# Patient Record
Sex: Female | Born: 1980 | Race: Black or African American | Hispanic: No | Marital: Single | Smoking: Never smoker
Health system: Southern US, Community
[De-identification: ages and names within clinical notes are randomized; demographics above are authoritative.]

## PROBLEM LIST (undated history)

## (undated) DIAGNOSIS — F419 Anxiety disorder, unspecified: Secondary | ICD-10-CM

## (undated) DIAGNOSIS — D219 Benign neoplasm of connective and other soft tissue, unspecified: Secondary | ICD-10-CM

## (undated) DIAGNOSIS — G8929 Other chronic pain: Secondary | ICD-10-CM

## (undated) DIAGNOSIS — R519 Headache, unspecified: Secondary | ICD-10-CM

## (undated) DIAGNOSIS — D649 Anemia, unspecified: Secondary | ICD-10-CM

## (undated) DIAGNOSIS — N920 Excessive and frequent menstruation with regular cycle: Secondary | ICD-10-CM

## (undated) DIAGNOSIS — R51 Headache: Secondary | ICD-10-CM

## (undated) DIAGNOSIS — K5900 Constipation, unspecified: Secondary | ICD-10-CM

## (undated) DIAGNOSIS — D500 Iron deficiency anemia secondary to blood loss (chronic): Secondary | ICD-10-CM

## (undated) DIAGNOSIS — R002 Palpitations: Secondary | ICD-10-CM

## (undated) DIAGNOSIS — B019 Varicella without complication: Secondary | ICD-10-CM

## (undated) HISTORY — DX: Iron deficiency anemia secondary to blood loss (chronic): D50.0

## (undated) HISTORY — DX: Other chronic pain: G89.29

## (undated) HISTORY — DX: Varicella without complication: B01.9

## (undated) HISTORY — DX: Headache, unspecified: R51.9

## (undated) HISTORY — DX: Palpitations: R00.2

## (undated) HISTORY — DX: Benign neoplasm of connective and other soft tissue, unspecified: D21.9

## (undated) HISTORY — DX: Anemia, unspecified: D64.9

## (undated) HISTORY — DX: Constipation, unspecified: K59.00

## (undated) HISTORY — DX: Excessive and frequent menstruation with regular cycle: N92.0

## (undated) HISTORY — DX: Anxiety disorder, unspecified: F41.9

## (undated) HISTORY — DX: Headache: R51

---
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
## 1998-02-07 ENCOUNTER — Emergency Department (HOSPITAL_COMMUNITY): Admission: EM | Admit: 1998-02-07 | Discharge: 1998-02-07 | Payer: Self-pay | Admitting: Emergency Medicine

## 1998-07-30 ENCOUNTER — Emergency Department (HOSPITAL_COMMUNITY): Admission: EM | Admit: 1998-07-30 | Discharge: 1998-07-30 | Payer: Self-pay | Admitting: Emergency Medicine

## 1998-08-01 ENCOUNTER — Emergency Department (HOSPITAL_COMMUNITY): Admission: EM | Admit: 1998-08-01 | Discharge: 1998-08-01 | Payer: Self-pay | Admitting: Emergency Medicine

## 1998-10-14 ENCOUNTER — Inpatient Hospital Stay (HOSPITAL_COMMUNITY): Admission: AD | Admit: 1998-10-14 | Discharge: 1998-10-14 | Payer: Self-pay | Admitting: Obstetrics & Gynecology

## 1998-10-27 ENCOUNTER — Ambulatory Visit (HOSPITAL_COMMUNITY): Admission: RE | Admit: 1998-10-27 | Discharge: 1998-10-27 | Payer: Self-pay | Admitting: *Deleted

## 1998-11-07 ENCOUNTER — Inpatient Hospital Stay (HOSPITAL_COMMUNITY): Admission: AD | Admit: 1998-11-07 | Discharge: 1998-11-07 | Payer: Self-pay | Admitting: Obstetrics

## 1998-12-02 ENCOUNTER — Inpatient Hospital Stay (HOSPITAL_COMMUNITY): Admission: AD | Admit: 1998-12-02 | Discharge: 1998-12-02 | Payer: Self-pay | Admitting: *Deleted

## 1998-12-03 ENCOUNTER — Inpatient Hospital Stay (HOSPITAL_COMMUNITY): Admission: RE | Admit: 1998-12-03 | Discharge: 1998-12-03 | Payer: Self-pay | Admitting: *Deleted

## 1998-12-07 ENCOUNTER — Inpatient Hospital Stay (HOSPITAL_COMMUNITY): Admission: AD | Admit: 1998-12-07 | Discharge: 1998-12-07 | Payer: Self-pay | Admitting: *Deleted

## 1998-12-13 ENCOUNTER — Inpatient Hospital Stay (HOSPITAL_COMMUNITY): Admission: AD | Admit: 1998-12-13 | Discharge: 1998-12-13 | Payer: Self-pay | Admitting: Obstetrics & Gynecology

## 1999-02-07 ENCOUNTER — Inpatient Hospital Stay (HOSPITAL_COMMUNITY): Admission: AD | Admit: 1999-02-07 | Discharge: 1999-02-07 | Payer: Self-pay | Admitting: Obstetrics

## 1999-04-20 ENCOUNTER — Inpatient Hospital Stay (HOSPITAL_COMMUNITY): Admission: AD | Admit: 1999-04-20 | Discharge: 1999-04-22 | Payer: Self-pay | Admitting: *Deleted

## 1999-04-25 ENCOUNTER — Inpatient Hospital Stay (HOSPITAL_COMMUNITY): Admission: AD | Admit: 1999-04-25 | Discharge: 1999-04-25 | Payer: Self-pay | Admitting: *Deleted

## 1999-07-04 ENCOUNTER — Emergency Department (HOSPITAL_COMMUNITY): Admission: EM | Admit: 1999-07-04 | Discharge: 1999-07-04 | Payer: Self-pay | Admitting: Emergency Medicine

## 2001-02-20 ENCOUNTER — Emergency Department (HOSPITAL_COMMUNITY): Admission: EM | Admit: 2001-02-20 | Discharge: 2001-02-20 | Payer: Self-pay | Admitting: Emergency Medicine

## 2001-08-02 ENCOUNTER — Emergency Department (HOSPITAL_COMMUNITY): Admission: EM | Admit: 2001-08-02 | Discharge: 2001-08-02 | Payer: Self-pay | Admitting: Emergency Medicine

## 2005-08-10 ENCOUNTER — Ambulatory Visit: Payer: Self-pay | Admitting: Family Medicine

## 2005-08-10 ENCOUNTER — Other Ambulatory Visit: Admission: RE | Admit: 2005-08-10 | Discharge: 2005-08-10 | Payer: Self-pay | Admitting: Family Medicine

## 2011-07-12 ENCOUNTER — Ambulatory Visit: Payer: Self-pay | Admitting: Family Medicine

## 2011-09-05 ENCOUNTER — Ambulatory Visit: Payer: Self-pay | Admitting: Family Medicine

## 2011-12-13 ENCOUNTER — Encounter: Payer: Self-pay | Admitting: Family Medicine

## 2012-01-05 ENCOUNTER — Ambulatory Visit: Payer: Self-pay | Admitting: Family Medicine

## 2012-01-29 ENCOUNTER — Ambulatory Visit: Payer: Self-pay | Admitting: Family Medicine

## 2012-02-23 ENCOUNTER — Ambulatory Visit: Payer: Self-pay | Admitting: Family Medicine

## 2012-03-13 ENCOUNTER — Ambulatory Visit: Payer: Self-pay | Admitting: Family Medicine

## 2012-03-25 ENCOUNTER — Ambulatory Visit: Payer: Self-pay | Admitting: Family Medicine

## 2012-06-24 ENCOUNTER — Ambulatory Visit (INDEPENDENT_AMBULATORY_CARE_PROVIDER_SITE_OTHER): Payer: BC Managed Care – PPO | Admitting: Family Medicine

## 2012-06-24 ENCOUNTER — Encounter: Payer: Self-pay | Admitting: Family Medicine

## 2012-06-24 ENCOUNTER — Other Ambulatory Visit (HOSPITAL_COMMUNITY)
Admission: RE | Admit: 2012-06-24 | Discharge: 2012-06-24 | Disposition: A | Discharge status: Home / Self Care | Payer: BC Managed Care – PPO | Source: Ambulatory Visit | Attending: Family Medicine | Admitting: Family Medicine

## 2012-06-24 VITALS — BP 120/70 | HR 85 | Temp 98.5°F | Ht 66.25 in | Wt 181.0 lb

## 2012-06-24 DIAGNOSIS — J329 Chronic sinusitis, unspecified: Secondary | ICD-10-CM

## 2012-06-24 DIAGNOSIS — Z01419 Encounter for gynecological examination (general) (routine) without abnormal findings: Secondary | ICD-10-CM

## 2012-06-24 DIAGNOSIS — M25569 Pain in unspecified knee: Secondary | ICD-10-CM

## 2012-06-24 DIAGNOSIS — Z202 Contact with and (suspected) exposure to infections with a predominantly sexual mode of transmission: Secondary | ICD-10-CM

## 2012-06-24 DIAGNOSIS — Z9189 Other specified personal risk factors, not elsewhere classified: Secondary | ICD-10-CM

## 2012-06-24 DIAGNOSIS — M25561 Pain in right knee: Secondary | ICD-10-CM

## 2012-06-24 DIAGNOSIS — M25562 Pain in left knee: Secondary | ICD-10-CM | POA: Insufficient documentation

## 2012-06-24 DIAGNOSIS — Z124 Encounter for screening for malignant neoplasm of cervix: Secondary | ICD-10-CM

## 2012-06-24 LAB — CBC WITH DIFFERENTIAL/PLATELET
Basophils Absolute: 0.1 10*3/uL (ref 0.0–0.1)
Eosinophils Absolute: 0.4 10*3/uL (ref 0.0–0.7)
Hemoglobin: 8.8 g/dL — ABNORMAL LOW (ref 12.0–15.0)
Lymphocytes Relative: 15.7 % (ref 12.0–46.0)
MCHC: 30.1 g/dL (ref 30.0–36.0)
Neutro Abs: 6.3 10*3/uL (ref 1.4–7.7)
Neutrophils Relative %: 74.7 % (ref 43.0–77.0)
RDW: 18.6 % — ABNORMAL HIGH (ref 11.5–14.6)

## 2012-06-24 LAB — HEPATIC FUNCTION PANEL
ALT: 7 U/L (ref 0–35)
Bilirubin, Direct: 0 mg/dL (ref 0.0–0.3)
Total Bilirubin: 0.5 mg/dL (ref 0.3–1.2)

## 2012-06-24 LAB — LIPID PANEL
Cholesterol: 103 mg/dL (ref 0–200)
HDL: 46.4 mg/dL (ref >39.00)
LDL Cholesterol: 47 mg/dL (ref 0–99)
Triglycerides: 50 mg/dL (ref 0.0–149.0)
VLDL: 10 mg/dL (ref 0.0–40.0)

## 2012-06-24 LAB — BASIC METABOLIC PANEL
Chloride: 106 mEq/L (ref 96–112)
Creatinine, Ser: 0.8 mg/dL (ref 0.4–1.2)

## 2012-06-24 MED ORDER — AMOXICILLIN 875 MG PO TABS: 875.0000 mg | ORAL_TABLET | Freq: Two times a day (BID) | ORAL | Status: DC

## 2012-06-24 MED ORDER — FERROUS SULFATE 325 (65 FE) MG PO TABS: 325.0000 mg | ORAL_TABLET | Freq: Every day | ORAL | Status: DC

## 2012-06-24 NOTE — Patient Instructions (Addendum)
We'll notify you of your lab results and make any changes if needed Start the Amox twice daily for the sinus infection- take w/ food Drink plenty of fluids Mucinex to thin your congestion and drainage Continue the allergy medication Call with any questions or concerns Welcome!  We're glad to have you!

## 2012-06-24 NOTE — Progress Notes (Signed)
  Subjective:    Patient ID: Tamara Ball, female    DOB: 10/17/1980, 32 y.o.   MRN: [id number]  HPI New to establish.  Previous MD- [facility].  No current GYN.  Desires CPE.  Sinus congestion- now having HAs, facial pain/pressure.  + nasal congestion.  sxs started 1 week ago.  Hx of seasonal allergies but no relief w/ allergy meds.  No fevers.  Mild body aches, no ear pain.  No cough.  + PND.  + sick contacts.  Bilateral knee pain- pain started ~6 months ago after working out.  Now will have pain w/ wearing heels and particularly w/ walking down stairs, will have to walk down sideways.  Pain is anterior, over knee caps.   Review of Systems Patient reports no vision/ hearing changes, adenopathy,fever, weight change,  persistant/recurrent hoarseness , swallowing issues, chest pain, palpitations, edema, persistant/recurrent cough, hemoptysis, dyspnea (rest/exertional/paroxysmal nocturnal), gastrointestinal bleeding (melena, rectal bleeding), abdominal pain, significant heartburn, bowel changes, GU symptoms (dysuria, hematuria, incontinence), Gyn symptoms (abnormal  bleeding, pain),  syncope, focal weakness, memory loss, numbness & tingling, skin/hair/nail changes, abnormal bruising or bleeding, anxiety, or depression.     Objective:   Physical Exam  General Appearance:    Alert, cooperative, no distress, appears stated age  Head:    Normocephalic, without obvious abnormality, atraumatic  Eyes:    PERRL, conjunctiva/corneas clear, EOM's intact, fundi    benign, both eyes  Ears:    Normal TM's and external ear canals, both ears  Nose:   Nares normal, septum midline, mucosal edema and pallor, + TTP over frontal and maxillary sinuses  Throat:   Lips, mucosa, and tongue normal; teeth and gums normal  Neck:   Supple, symmetrical, trachea midline, no adenopathy;    Thyroid: no enlargement/tenderness/nodules  Back:     Symmetric, no curvature, ROM normal, no CVA tenderness  Lungs:      Clear to auscultation bilaterally, respirations unlabored  Chest Wall:    No tenderness or deformity   Heart:    Regular rate and rhythm, S1 and S2 normal, no murmur, rub   or gallop  Breast Exam:    No tenderness, masses, or nipple abnormality  Abdomen:     Soft, non-tender, bowel sounds active all four quadrants,    no masses, no organomegaly  Genitalia:    External genitalia normal, cervix normal in appearance, no CMT, uterus in normal size and position, adnexa w/out mass or tenderness, mucosa pink and moist, no lesions or discharge present  Rectal:    Normal external appearance  Extremities:   Extremities normal, atraumatic, no cyanosis or edema, full ROM of knees bilaterally  Pulses:   2+ and symmetric all extremities  Skin:   Skin color, texture, turgor normal, no rashes or lesions  Lymph nodes:   Cervical, supraclavicular, and axillary nodes normal  Neurologic:   CNII-XII intact, normal strength, sensation and reflexes    throughout          Assessment & Plan:

## 2012-06-24 NOTE — Assessment & Plan Note (Signed)
New.  No pain on exam today but pt reports months of pain w/ wearing heels and walking down stairs.  Will refer to ortho for complete evaluation and tx.

## 2012-06-24 NOTE — Assessment & Plan Note (Signed)
Pt's PE WNL w/ exception of current sinus infxn.  Check labs.  Anticipatory guidance provided.

## 2012-06-24 NOTE — Assessment & Plan Note (Signed)
Pap collected. 

## 2012-06-24 NOTE — Addendum Note (Signed)
Addended by: Sheliah Hatch on: 06/24/2012 09:44 PM   Modules accepted: Orders

## 2012-06-24 NOTE — Assessment & Plan Note (Signed)
New.  Pt's sxs and PE consistent w/ infxn.  Start abx.  Reviewed supportive care and red flags that should prompt return.  Pt expressed understanding and is in agreement w/ plan.  

## 2012-06-25 ENCOUNTER — Encounter: Payer: Self-pay | Admitting: Family Medicine

## 2012-06-28 ENCOUNTER — Telehealth: Payer: Self-pay | Admitting: *Deleted

## 2012-06-28 DIAGNOSIS — D509 Iron deficiency anemia, unspecified: Secondary | ICD-10-CM

## 2012-06-28 NOTE — Telephone Encounter (Signed)
Spoke with the pt and informed her of recent labs and results.  Pt understood and agreed.  Lab appt scheduled and orders for the lab sent.//AB/CMA

## 2012-06-28 NOTE — Telephone Encounter (Signed)
Message copied by Verdie Shire on Fri Jun 28, 2012  2:19 PM ------      Message from: Sheliah Hatch      Created: Mon Jun 24, 2012  9:46 PM       Sent script for daily iron supplement- please schedule her for f/u CBC w/ diff (dx iron deficiency anemia) in 3 months ------

## 2012-06-29 LAB — VITAMIN D 1,25 DIHYDROXY: Vitamin D3 1, 25 (OH)2: 62 pg/mL

## 2012-07-01 ENCOUNTER — Encounter: Payer: Self-pay | Admitting: Family Medicine

## 2012-07-02 ENCOUNTER — Telehealth: Payer: Self-pay | Admitting: *Deleted

## 2012-07-02 NOTE — Telephone Encounter (Signed)
Spoke with the pt and she stated that she's not doing any better.  Asked the pt what is her sxs and she stated she's still feeling the same as she did when she come in on last Tues.  She said she has to dose herself up(with Mucinex,Ibuprofen, and Allergy meds) to get through the day.  She's still taking the Amoxil(which she has 2 more days left).//AB/CMA

## 2012-07-03 NOTE — Telephone Encounter (Signed)
Needs to continue the Mucinex, allergy meds, and ibuprofen- this is completely appropriate.  Is she still having facial pain?  Fever?  Bloody nasal drainage?  If so, we can switch her abx to bactrim DS 1 tab bid x10 days.  If none of those sxs are currently present, it is more allergy/viral combo and will need to run it's course- which can take some time.

## 2012-08-02 ENCOUNTER — Encounter: Payer: Self-pay | Admitting: Family Medicine

## 2012-08-16 ENCOUNTER — Telehealth: Payer: Self-pay | Admitting: Family Medicine

## 2012-08-16 NOTE — Telephone Encounter (Signed)
Noted  

## 2012-08-16 NOTE — Telephone Encounter (Signed)
In reference to Orthopaedic referral entered on 06/24/12, patient has never scheduled.  Orthopaedic attempted to reach patient and left messages with no response.  I have also left messages for patient and mailed her a letter, no response.

## 2012-09-27 ENCOUNTER — Other Ambulatory Visit: Payer: BC Managed Care – PPO

## 2013-01-07 ENCOUNTER — Encounter: Payer: Self-pay | Admitting: Family Medicine

## 2013-01-07 ENCOUNTER — Ambulatory Visit (INDEPENDENT_AMBULATORY_CARE_PROVIDER_SITE_OTHER): Payer: BC Managed Care – PPO | Admitting: Family Medicine

## 2013-01-07 VITALS — BP 108/72 | HR 86 | Temp 98.6°F | Wt 177.0 lb

## 2013-01-07 DIAGNOSIS — J019 Acute sinusitis, unspecified: Secondary | ICD-10-CM

## 2013-01-07 MED ORDER — AMOXICILLIN-POT CLAVULANATE 875-125 MG PO TABS: 1.0000 | ORAL_TABLET | Freq: Two times a day (BID) | ORAL | Status: DC

## 2013-01-07 MED ORDER — BECLOMETHASONE DIPROPIONATE 80 MCG/ACT NA AERS: 2.0000 | INHALATION_SPRAY | Freq: Every day | NASAL | Status: DC

## 2013-01-07 NOTE — Patient Instructions (Signed)

## 2013-01-07 NOTE — Progress Notes (Signed)
  Subjective:     Tamara Ball is a 32 y.o. female who presents for evaluation of sinus pain. Symptoms include: congestion, facial pain, nasal congestion and sinus pressure. Onset of symptoms was 1 month ago. Symptoms have been gradually worsening since that time. Past history is significant for no history of pneumonia or bronchitis. Patient is a non-smoker.  The following portions of the patient's history were reviewed and updated as appropriate: allergies, current medications, past family history, past medical history, past social history, past surgical history and problem list.  Review of Systems Pertinent items are noted in HPI.   Objective:    BP 108/72  Pulse 86  Temp(Src) 98.6 F (37 C) (Oral)  Wt 177 lb (80.287 kg)  BMI 28.35 kg/m2  SpO2 99% General appearance: alert, cooperative, appears stated age and no distress Ears: normal TM's and external ear canals both ears Nose: green discharge, moderate congestion, turbinates red, swollen, sinus tenderness bilateral Throat: lips, mucosa, and tongue normal; teeth and gums normal Neck: no adenopathy, supple, symmetrical, trachea midline and thyroid not enlarged, symmetric, no tenderness/mass/nodules Lungs: clear to auscultation bilaterally Heart: S1, S2 normal    Assessment:    Acute bacterial sinusitis.    Plan:    Nasal steroids per medication orders. Antihistamines per medication orders. Augmentin per medication orders.

## 2013-01-14 ENCOUNTER — Telehealth: Payer: Self-pay | Admitting: Family Medicine

## 2013-01-14 NOTE — Telephone Encounter (Signed)
Patient Information:  Caller Name: Lourie  Phone: [phone]  Patient: Tamara Ball  Gender: Female  DOB: 01/06/1981  Age: 32 Years  PCP: Sheliah Hatch  Pregnant: No  Office Follow Up:  Does the office need to follow up with this patient?: Yes  Instructions For The Office: Patient requests recommendations from provider regarding next steps.   Symptoms  Reason For Call & Symptoms: Nasal congestion even after Amoxicillin x 7 days.  Denies fever.  Patient states she has been ill x 1 month with same sxs.  Patient would like recommendations from provider prior to scheduling another appt.  Reviewed Health History In EMR: Yes  Reviewed Medications In EMR: Yes  Reviewed Allergies In EMR: Yes  Reviewed Surgeries / Procedures: Yes  Date of Onset of Symptoms: 01/07/2013  Treatments Tried: Amoxicillin, Allergy Med  Treatments Tried Worked: No OB / GYN:  LMP: 01/14/2013  Guideline(s) Used:  Sinus Pain and Congestion  Disposition Per Guideline:   See Today or Tomorrow in Office  Reason For Disposition Reached:   Sinus congestion (pressure, fullness) present > 10 days  Advice Given:  Reassurance:   Sinus congestion is a normal part of a cold.  Usually home treatment with nasal washes can prevent an actual bacterial sinus infection.  Antibiotics are not helpful for the sinus congestion that occurs with colds.  Here is some care advice that should help.  For a Runny Nose With Profuse Discharge:  Nasal mucus and discharge helps to wash viruses and bacteria out of the nose and sinuses.  Blowing the nose is all that is needed.  If the skin around your nostrils gets irritated, apply a tiny amount of petroleum ointment to the nasal openings once or twice a day.  For a Stuffy Nose - Use Nasal Washes:  Introduction: Saline (salt water) nasal irrigation (nasal wash) is an effective and simple home remedy for treating stuffy nose and sinus congestion. The nose can be irrigated by pouring,  spraying, or squirting salt water into the nose and then letting it run back out.  How it Helps: The salt water rinses out excess mucus, washes out any irritants (dust, allergens) that might be present, and moistens the nasal cavity.  Methods: There are several ways to perform nasal irrigation. You can use a saline nasal spray bottle (available over-the-counter), a rubber ear syringe, a medical syringe without the needle, or a Neti Pot.  Call Back If:   You become worse.  Patient Refused Recommendation:  Patient Will Follow Up With Office Later  Patient would like recommendations from provider without office visit due to being seen x 7 days ago.  Please advise.

## 2013-01-14 NOTE — Telephone Encounter (Signed)
Tried to advise pt that she would need a follow up. Message forwarded to provider.

## 2013-01-15 MED ORDER — FLUTICASONE PROPIONATE 50 MCG/ACT NA SUSP: 2.0000 | Freq: Every day | NASAL | Status: DC

## 2013-01-15 NOTE — Telephone Encounter (Signed)
Pt notified and flonase filled.

## 2013-01-15 NOTE — Telephone Encounter (Signed)
Should be taking OTC antihistamine daily and add nasal steroid (flonase- 2 sprays each nostril daily, #1 bottle, 6 refills).  Suspect this is not allergy related and not infxn since the augmentin would have treated this.

## 2013-01-15 NOTE — Telephone Encounter (Signed)
Called pt and LMOVM to return call.  °

## 2013-01-31 ENCOUNTER — Telehealth: Payer: Self-pay | Admitting: General Practice

## 2013-01-31 MED ORDER — FLUCONAZOLE 150 MG PO TABS: 150.0000 mg | ORAL_TABLET | Freq: Once | ORAL | Status: DC

## 2013-01-31 NOTE — Telephone Encounter (Signed)
Ok for Diflucan 150mg x1 dose 

## 2013-01-31 NOTE — Telephone Encounter (Signed)
Med sent to pharmacy and pt notified.

## 2013-01-31 NOTE — Telephone Encounter (Signed)
Pt called stating that she feels better after taking her amoxocillin. States that she is now having symptoms of a yeast infection. Does she need to come in.

## 2013-03-13 ENCOUNTER — Other Ambulatory Visit: Payer: Self-pay

## 2013-07-17 ENCOUNTER — Encounter (HOSPITAL_COMMUNITY): Payer: Self-pay | Admitting: Emergency Medicine

## 2013-07-17 ENCOUNTER — Emergency Department (HOSPITAL_COMMUNITY)
Admission: EM | Admit: 2013-07-17 | Discharge: 2013-07-17 | Disposition: A | Discharge status: Home / Self Care | Payer: BC Managed Care – PPO | Attending: Emergency Medicine | Admitting: Emergency Medicine

## 2013-07-17 DIAGNOSIS — M25569 Pain in unspecified knee: Secondary | ICD-10-CM | POA: Insufficient documentation

## 2013-07-17 DIAGNOSIS — M222X9 Patellofemoral disorders, unspecified knee: Secondary | ICD-10-CM

## 2013-07-17 DIAGNOSIS — Z87828 Personal history of other (healed) physical injury and trauma: Secondary | ICD-10-CM | POA: Insufficient documentation

## 2013-07-17 DIAGNOSIS — Z8619 Personal history of other infectious and parasitic diseases: Secondary | ICD-10-CM | POA: Insufficient documentation

## 2013-07-17 DIAGNOSIS — Z79899 Other long term (current) drug therapy: Secondary | ICD-10-CM | POA: Insufficient documentation

## 2013-07-17 DIAGNOSIS — Z792 Long term (current) use of antibiotics: Secondary | ICD-10-CM | POA: Insufficient documentation

## 2013-07-17 DIAGNOSIS — G8929 Other chronic pain: Secondary | ICD-10-CM | POA: Insufficient documentation

## 2013-07-17 DIAGNOSIS — IMO0002 Reserved for concepts with insufficient information to code with codable children: Secondary | ICD-10-CM | POA: Insufficient documentation

## 2013-07-17 MED ORDER — NAPROXEN 500 MG PO TABS
500.0000 mg | ORAL_TABLET | Freq: Two times a day (BID) | ORAL | Status: DC
Start: 2013-07-17 — End: 2014-03-13

## 2013-07-17 NOTE — ED Provider Notes (Signed)
Medical screening examination/treatment/procedure(s) were performed by non-physician practitioner and as supervising physician I was immediately available for consultation/collaboration.   EKG Interpretation None      Rolland Porter, MD, Abram Sander   Janice Norrie, MD 07/17/13 [phone]

## 2013-07-17 NOTE — ED Provider Notes (Signed)
CSN: [id number]     Arrival date & time 07/17/13  1816 History  This chart was scribed for non-physician practitioner, Jeannett Senior, PA-C,working with Janice Norrie, MD, by Marlowe Kays, ED Scribe.  This patient was seen in room [facility] and the patient's care was started at 7:09 PM.  Chief Complaint  Patient presents with  . Knee Pain   The history is provided by the patient. No language interpreter was used.   HPI Comments:  Tamara Ball is a 33 y.o. female who presents to the Emergency Department complaining of intermittent bilateral knee pain for the past two months. She states going up or down stairs makes her pain worse, especially in her right knee. She states she has injured her left knee in the past and overcompensated with her right knee and now thinks she has done damage to that knee as well. She denies taking anything for pain. She denies swelling, bruising of the knees, posterior knee pain, recent trauma or injury. Pt states she does have a PCP.  Past Medical History  Diagnosis Date  . Chicken pox   . Chronic headaches    No past surgical history on file. Family History  Problem Relation Age of Onset  . Hypertension Mother   . Hypertension Maternal Uncle   . Diabetes Maternal Grandfather    History  Substance Use Topics  . Smoking status: Never Smoker   . Smokeless tobacco: Not on file  . Alcohol Use: Yes     Comment: social   OB History   Grav Para Term Preterm Abortions TAB SAB Ect Mult Living                 Review of Systems  Musculoskeletal: Positive for arthralgias (bilateral knees). Negative for joint swelling.    Allergies  Review of patient's allergies indicates no known allergies.  Home Medications   Current Outpatient Rx  Name  Route  Sig  Dispense  Refill  . amoxicillin-clavulanate (AUGMENTIN) 875-125 MG per tablet   Oral   Take 1 tablet by mouth 2 (two) times daily.   20 tablet   0   . Beclomethasone Dipropionate (QNASL) 80  MCG/ACT AERS   Nasal   Place 2 sprays into the nose daily.         . fluconazole (DIFLUCAN) 150 MG tablet   Oral   Take 1 tablet (150 mg total) by mouth once.   1 tablet   0   . fluticasone (FLONASE) 50 MCG/ACT nasal spray   Nasal   Place 2 sprays into the nose daily.   16 g   6   . loratadine (CLARITIN) 10 MG tablet   Oral   Take 10 mg by mouth daily.          Triage Vitals: BP 144/63  Pulse 85  Temp(Src) 98.3 F (36.8 C) (Oral)  Resp 18  SpO2 100%  LMP 07/04/2013 Physical Exam  Nursing note and vitals reviewed. Constitutional: She is oriented to person, place, and time. She appears well-developed and well-nourished.  HENT:  Head: Normocephalic and atraumatic.  Eyes: EOM are normal.  Neck: Normal range of motion.  Cardiovascular: Normal rate.   Pulmonary/Chest: Effort normal.  Musculoskeletal: Normal range of motion.  Normal appearing right knee. No tenderness over medial, lateral, anterior joint. No posterior joint tenderness. Full range of motion with flexion extension. Negative anterior posterior drawer signs. No laxity or pain with medial or lateral stress on the knee. No pain  with extension or flexion against resistance. No swelling in the lower legs or any calf pain or tenderness. Dorsal pedal pulses intact.  Neurological: She is alert and oriented to person, place, and time.  Skin: Skin is warm and dry.  Psychiatric: She has a normal mood and affect. Her behavior is normal.    ED Course  Procedures (including critical care time) DIAGNOSTIC STUDIES: Oxygen Saturation is 100% on RA, normal by my interpretation.   COORDINATION OF CARE: 7:14 PM- Will refer to an orthopedist. Will prescribe an NSAID. Pt verbalizes understanding and agrees to plan.  Medications - No data to display  Labs Review Labs Reviewed - No data to display Imaging Review No results found.   EKG Interpretation None      MDM   Final diagnoses:  None    Patient with  patellofemoral pain. Pain is worse with stairs, otherwise states she does not have much discomfort. She's not taking any current medications. Will start her on Naprosyn and advised her to get patella and then and see if that will help. Followup with orthopedic specialist. I do not think any imaging is indicated on the basis.  Filed Vitals:   07/17/13 1853  BP: 144/63  Pulse: 85  Temp: 98.3 F (36.8 C)  TempSrc: Oral  Resp: 18  SpO2: 100%     I personally performed the services described in this documentation, which was scribed in my presence. The recorded information has been reviewed and is accurate.    Renold Genta, PA-C 07/17/13 1925

## 2013-07-17 NOTE — Discharge Instructions (Signed)
Naprosyn for pain and inflammation. Avoid stairs. Start doing exercises. Followup with orthopedic specialist. Ice and elevate at home when resting.   Patellofemoral Syndrome If you have had pain in the front of your knee for a long time, chances are good that you have patellofemoral syndrome. The word patella refers to the kneecap. Femoral (or femur) refers to the thigh bone. That is the bone the kneecap sits on. The kneecap is shaped like a triangle. Its job is to protect the knee and to improve the efficiency of your thigh muscles (quadriceps). The underside of the kneecap is made of smooth tissue (cartilage). This lets the kneecap slide up and down as the knee moves. Sometimes this cartilage becomes soft. Your healthcare provider may say the cartilage breaks down. That is patellofemoral syndrome. It can affect one knee, or both. The condition is sometimes called patellofemoral pain syndrome. That is because the condition is painful. The pain usually gets worse with activity. Sitting for a long time with the knee bent also makes the pain worse. It usually gets better with rest and proper treatment. CAUSES  No one is sure why some people develop this problem and others do not. Runners often get it. One name for the condition is "runner's knee." However, some people run for years and never have knee pain. Certain things seem to make patellofemoral syndrome more likely. They include:  Moving out of alignment. The kneecap is supposed to move in a straight line when the thigh muscle pulls on it. Sometimes the kneecap moves in poor alignment. That can make the knee swell and hurt. Some experts believe it also wears down the cartilage.  Injury to the kneecap.  Strain on the knee. This may occur during sports activity. Soccer, running, skiing and cycling can put excess stress on the knee.  Being flat-footed or knock-kneed. SYMPTOMS   Knee pain.  Pain under the kneecap. This is usually a dull, aching  pain.  Pain in the knee when doing certain things: squatting, kneeling, going up or down stairs.  Pain in the knee when you stand up after sitting down for awhile.  Tightness in the knee.  Loss of muscle strength in the thigh.  Swelling of the knee. DIAGNOSIS  Healthcare providers often send people with knee pain to an orthopedic caregiver. This person has special training to treat problems with bones and joints. To decide what is causing your knee pain, your caregiver will probably:  Do a physical exam. This will probably include:  Asking about symptoms you have noticed.  Asking about your activities and any injuries.  Feeling your knee. Moving it. This will help test the knee's strength. It will also check alignment (whether the knee and leg are aligned normally).  Order some tests, such as:  Imaging tests. They create pictures of the inside of the knee. Tests may include:  X-rays.  Computed tomography (CT) scan. This uses X-rays and a computer to show more detail.  Magnetic resonance imaging (MRI). This test uses magnets, radio waves and a computer to make pictures. TREATMENT   Medication is almost always used first. It can relieve pain. It also can reduce swelling. Non-steroidal anti-inflammatory medicines (called NSAIDs) are usually suggested. Sometimes a stronger form is needed. A stronger form would require a prescription.  Other treatment may be needed after the swelling goes down. Possibilities include:  Exercise. Certain exercises can make the muscles around the knee stronger which decreases the pressure on the knee cap. This includes  the thigh muscle. Certain exercises also may be suggested to increase your flexibility.  A knee brace. This gives the knee extra support and helps align the movement of the knee cap.  Orthotics. These are special shoe inserts. They can help keep your leg and knee aligned.  Surgery is sometimes needed. This is rare. Options  include:  Arthroscopy. The surgeon uses a special tool to remove any damaged pieces of the kneecap. Only a few small incisions (cuts) are needed.  Realignment. This is open surgery. The goals are to reduce pressure and fix the way the kneecap moves. HOME CARE INSTRUCTIONS   Take any medication prescribed by your healthcare provider. Follow the directions carefully.  If your knee is swollen:  Put ice or cold packs on it. Do this for 20 to 30 minutes, 3 to 4 times a day.  Keep the knee raised. Make sure it is supported. Put a pillow under it.  Rest your knee. For example, take the elevator instead of the stairs for awhile. Or, take a break from sports activity that strain your knee. Try walking or swimming instead.  Whenever you are active:  Use an elastic bandage on your knee. This gives it support.  After any activity, put ice or cold packs on your knees. Do this for about 10 to 20 minutes.  Make sure you wear shoes that give good support. Make sure they are not worn down. The heels should not slant in or out. SEEK MEDICAL CARE IF:   Knee pain gets worse. Or it does not go away, even after taking pain medicine.  Swelling does not go down.  Your thigh muscle becomes weak.  You have an oral temperature above 102 F (38.9 C). SEEK IMMEDIATE MEDICAL CARE IF:  You have an oral temperature above 102 F (38.9 C), not controlled by medicine. Document Released: 04/12/2009 Document Revised: 07/17/2011 Document Reviewed: 04/12/2009 [facility] Patient Information 2014 [place], [place].    Knee Exercises EXERCISES RANGE OF MOTION(ROM) AND STRETCHING EXERCISES These exercises may help you when beginning to rehabilitate your injury. Your symptoms may resolve with or without further involvement from your physician, physical therapist or athletic trainer. While completing these exercises, remember:   Restoring tissue flexibility helps normal motion to return to the joints. This allows  healthier, less painful movement and activity.  An effective stretch should be held for at least 30 seconds.  A stretch should never be painful. You should only feel a gentle lengthening or release in the stretched tissue. STRETCH - Knee Extension, Prone  Lie on your stomach on a firm surface, such as a bed or countertop. Place your right / left knee and leg just beyond the edge of the surface. You may wish to place a towel under the far end of your right / left thigh for comfort.  Relax your leg muscles and allow gravity to straighten your knee. Your clinician may advise you to add an ankle weight if more resistance is helpful for you.  You should feel a stretch in the back of your right / left knee. Hold this position for __________ seconds. Repeat __________ times. Complete this stretch __________ times per day. * Your physician, physical therapist or athletic trainer may ask you to add ankle weight to enhance your stretch.  RANGE OF MOTION - Knee Flexion, Active  Lie on your back with both knees straight. (If this causes back discomfort, bend your opposite knee, placing your foot flat on the floor.)  Slowly  slide your heel back toward your buttocks until you feel a gentle stretch in the front of your knee or thigh.  Hold for __________ seconds. Slowly slide your heel back to the starting position. Repeat __________ times. Complete this exercise __________ times per day.  STRETCH - Quadriceps, Prone   Lie on your stomach on a firm surface, such as a bed or padded floor.  Bend your right / left knee and grasp your ankle. If you are unable to reach, your ankle or pant leg, use a belt around your foot to lengthen your reach.  Gently pull your heel toward your buttocks. Your knee should not slide out to the side. You should feel a stretch in the front of your thigh and/or knee.  Hold this position for __________ seconds. Repeat __________ times. Complete this stretch __________ times  per day.  STRETCH  Hamstrings, Supine   Lie on your back. Loop a belt or towel over the ball of your right / left foot.  Straighten your right / left knee and slowly pull on the belt to raise your leg. Do not allow the right / left knee to bend. Keep your opposite leg flat on the floor.  Raise the leg until you feel a gentle stretch behind your right / left knee or thigh. Hold this position for __________ seconds. Repeat __________ times. Complete this stretch __________ times per day.  STRENGTHENING EXERCISES These exercises may help you when beginning to rehabilitate your injury. They may resolve your symptoms with or without further involvement from your physician, physical therapist or athletic trainer. While completing these exercises, remember:   Muscles can gain both the endurance and the strength needed for everyday activities through controlled exercises.  Complete these exercises as instructed by your physician, physical therapist or athletic trainer. Progress the resistance and repetitions only as guided.  You may experience muscle soreness or fatigue, but the pain or discomfort you are trying to eliminate should never worsen during these exercises. If this pain does worsen, stop and make certain you are following the directions exactly. If the pain is still present after adjustments, discontinue the exercise until you can discuss the trouble with your clinician. STRENGTH - Quadriceps, Isometrics  Lie on your back with your right / left leg extended and your opposite knee bent.  Gradually tense the muscles in the front of your right / left thigh. You should see either your knee cap slide up toward your hip or increased dimpling just above the knee. This motion will push the back of the knee down toward the floor/mat/bed on which you are lying.  Hold the muscle as tight as you can without increasing your pain for __________ seconds.  Relax the muscles slowly and completely in  between each repetition. Repeat __________ times. Complete this exercise __________ times per day.  STRENGTH - Quadriceps, Short Arcs   Lie on your back. Place a __________ inch towel roll under your knee so that the knee slightly bends.  Raise only your lower leg by tightening the muscles in the front of your thigh. Do not allow your thigh to rise.  Hold this position for __________ seconds. Repeat __________ times. Complete this exercise __________ times per day.  OPTIONAL ANKLE WEIGHTS: Begin with ____________________, but DO NOT exceed ____________________. Increase in 1 pound/0.5 kilogram increments.  STRENGTH - Quadriceps, Straight Leg Raises  Quality counts! Watch for signs that the quadriceps muscle is working to insure you are strengthening the correct muscles and  not "cheating" by substituting with healthier muscles.  Lay on your back with your right / left leg extended and your opposite knee bent.  Tense the muscles in the front of your right / left thigh. You should see either your knee cap slide up or increased dimpling just above the knee. Your thigh may even quiver.  Tighten these muscles even more and raise your leg 4 to 6 inches off the floor. Hold for __________ seconds.  Keeping these muscles tense, lower your leg.  Relax the muscles slowly and completely in between each repetition. Repeat __________ times. Complete this exercise __________ times per day.  STRENGTH - Hamstring, Curls  Lay on your stomach with your legs extended. (If you lay on a bed, your feet may hang over the edge.)  Tighten the muscles in the back of your thigh to bend your right / left knee up to 90 degrees. Keep your hips flat on the bed/floor.  Hold this position for __________ seconds.  Slowly lower your leg back to the starting position. Repeat __________ times. Complete this exercise __________ times per day.  OPTIONAL ANKLE WEIGHTS: Begin with ____________________, but DO NOT exceed  ____________________. Increase in 1 pound/0.5 kilogram increments.  STRENGTH  Quadriceps, Squats  Stand in a door frame so that your feet and knees are in line with the frame.  Use your hands for balance, not support, on the frame.  Slowly lower your weight, bending at the hips and knees. Keep your lower legs upright so that they are parallel with the door frame. Squat only within the range that does not increase your knee pain. Never let your hips drop below your knees.  Slowly return upright, pushing with your legs, not pulling with your hands. Repeat __________ times. Complete this exercise __________ times per day.  STRENGTH - Quadriceps, Wall Slides  Follow guidelines for form closely. Increased knee pain often results from poorly placed feet or knees.  Lean against a smooth wall or door and walk your feet out 18-24 inches. Place your feet hip-width apart.  Slowly slide down the wall or door until your knees bend __________ degrees.* Keep your knees over your heels, not your toes, and in line with your hips, not falling to either side.  Hold for __________ seconds. Stand up to rest for __________ seconds in between each repetition. Repeat __________ times. Complete this exercise __________ times per day. * Your physician, physical therapist or athletic trainer will alter this angle based on your symptoms and progress. Document Released: 03/08/2005 Document Revised: 07/17/2011 Document Reviewed: 08/06/2008 [facility] Patient Information 2014 [place], [place].

## 2013-07-17 NOTE — ED Notes (Signed)
Pt states that she has intermittent bilat knee pain for several months.  Pt states that it's worse when she goes up and down stairs. Today pt states that she was going up an down stairs today and her right knee felt "as if it was going to go out".  Pt denies any injury or accident.

## 2013-09-19 ENCOUNTER — Ambulatory Visit (INDEPENDENT_AMBULATORY_CARE_PROVIDER_SITE_OTHER): Payer: BC Managed Care – PPO | Admitting: Family Medicine

## 2013-09-19 ENCOUNTER — Encounter: Payer: Self-pay | Admitting: Family Medicine

## 2013-09-19 VITALS — BP 120/78 | HR 92 | Temp 98.2°F | Resp 16 | Wt 184.4 lb

## 2013-09-19 DIAGNOSIS — N390 Urinary tract infection, site not specified: Secondary | ICD-10-CM

## 2013-09-19 DIAGNOSIS — R309 Painful micturition, unspecified: Secondary | ICD-10-CM

## 2013-09-19 DIAGNOSIS — R300 Dysuria: Secondary | ICD-10-CM

## 2013-09-19 LAB — POCT URINALYSIS DIPSTICK
BILIRUBIN UA: NEGATIVE
Glucose, UA: NEGATIVE
KETONES UA: NEGATIVE
NITRITE UA: POSITIVE
PH UA: 6
PROTEIN UA: 300
Spec Grav, UA: 1.015
Urobilinogen, UA: 0.2

## 2013-09-19 MED ORDER — CEPHALEXIN 500 MG PO CAPS: 500.0000 mg | ORAL_CAPSULE | Freq: Two times a day (BID) | ORAL | Status: AC

## 2013-09-19 NOTE — Progress Notes (Signed)
   Subjective:    Patient ID: Tamara Ball, female    DOB: 1980/08/05, 33 y.o.   MRN: [id number]  HPI UTI- sxs started 2 days ago w/ dysuria.  No visible blood.  Increased frequency and urgency.  + hesitancy, incomplete emptying.  No fevers.  No back pain.  + suprapubic pressure.  Has increased water intake and started cranberry juice.   Review of Systems For ROS see HPI     Objective:   Physical Exam  Vitals reviewed. Constitutional: She appears well-developed and well-nourished. No distress.  Abdominal: Soft. She exhibits no distension. There is no tenderness (no suprapubic or CVA tenderness).          Assessment & Plan:

## 2013-09-19 NOTE — Progress Notes (Signed)
Pre visit review using our clinic review tool, if applicable. No additional management support is needed unless otherwise documented below in the visit note. 

## 2013-09-19 NOTE — Assessment & Plan Note (Signed)
New.  Pt's sxs and UA consistent w/ infxn.  Start abx.  Reviewed supportive care and red flags that should prompt return.  Pt expressed understanding and is in agreement w/ plan.  

## 2013-09-19 NOTE — Patient Instructions (Signed)
Follow up as needed Start the Keflex twice daily Continue to drink plenty of fluids Call with any questions or concerns Have a great weekend!

## 2013-09-22 ENCOUNTER — Telehealth: Payer: Self-pay | Admitting: Family Medicine

## 2013-09-22 LAB — URINE CULTURE

## 2013-09-22 MED ORDER — FLUCONAZOLE 150 MG PO TABS: 150.0000 mg | ORAL_TABLET | Freq: Once | ORAL | Status: DC

## 2013-09-22 NOTE — Telephone Encounter (Signed)
Med filled to [facility] on [address].

## 2013-09-22 NOTE — Telephone Encounter (Signed)
Pt.notified

## 2013-09-22 NOTE — Telephone Encounter (Signed)
Caller name: Aleila Relation to pt: Call back number: [phone] Pharmacy: [facility] [address] [facility]  Reason for call:   Pt states that she is getting a yeasst infection from the antibiotics that she got on 5/18.  Please advise if we can get something called in for this.

## 2013-10-07 ENCOUNTER — Ambulatory Visit: Payer: BC Managed Care – PPO | Admitting: Family

## 2013-10-07 ENCOUNTER — Ambulatory Visit (INDEPENDENT_AMBULATORY_CARE_PROVIDER_SITE_OTHER): Payer: BC Managed Care – PPO | Admitting: Nurse Practitioner

## 2013-10-07 ENCOUNTER — Encounter: Payer: Self-pay | Admitting: Nurse Practitioner

## 2013-10-07 VITALS — BP 126/73 | HR 70 | Temp 97.9°F | Resp 18 | Ht 66.25 in | Wt 182.0 lb

## 2013-10-07 DIAGNOSIS — Z862 Personal history of diseases of the blood and blood-forming organs and certain disorders involving the immune mechanism: Secondary | ICD-10-CM

## 2013-10-07 DIAGNOSIS — N926 Irregular menstruation, unspecified: Secondary | ICD-10-CM | POA: Insufficient documentation

## 2013-10-07 LAB — CBC
HCT: 27.1 % — ABNORMAL LOW (ref 36.0–46.0)
Hemoglobin: 8.3 g/dL — ABNORMAL LOW (ref 12.0–15.0)
MCHC: 30.5 g/dL (ref 30.0–36.0)
MCV: 63.3 fl — ABNORMAL LOW (ref 78.0–100.0)
Platelets: 251 K/uL (ref 150.0–400.0)
RBC: 4.28 Mil/uL (ref 3.87–5.11)
RDW: 18.9 % — ABNORMAL HIGH (ref 11.5–15.5)
WBC: 5.8 K/uL (ref 4.0–10.5)

## 2013-10-07 LAB — IRON AND TIBC
%SAT: 2 % — ABNORMAL LOW (ref 20–55)
Iron: 12 ug/dL — ABNORMAL LOW (ref 42–145)
TIBC: 486 ug/dL — ABNORMAL HIGH (ref 250–470)
UIBC: 474 ug/dL — ABNORMAL HIGH (ref 125–400)

## 2013-10-07 LAB — POCT URINE PREGNANCY: Preg Test, Ur: NEGATIVE

## 2013-10-07 LAB — FERRITIN: Ferritin: 3 ng/mL — ABNORMAL LOW (ref 10.0–291.0)

## 2013-10-07 NOTE — Patient Instructions (Signed)
I think the large dose of progesterone (PlanB) has caused bleeding. I would expect bleeding to stop within 5-7 days. If not, or you are bleeding heavy-changing a pad or tampon every hour, call Dr Birdie Riddle. Your next cycle may be delayed because you are shedding uterine lining now. We will call with lab results & any f/u.    Levonorgestrel emergency contraceptive kit What is this medicine? LEVONORGESTREL (LEE voe nor jes trel) is an emergency contraceptive (birth control pill). It prevents pregnancy if taken within the 72 hours after unprotected sex. This medicine will not work if you are already pregnant. This medicine may be used for other purposes; ask your health care provider or pharmacist if you have questions. COMMON BRAND NAME(S): My Way, Next Choice One Dose, Next Choice, Plan B One-Step , Plan B What should I tell my health care provider before I take this medicine? They need to know if you have or ever had any of these conditions: -blood sugar problems, like diabetes -cancer of the breast, cervix, ovary, uterus, vagina, or unusual vaginal bleeding -fibroids -liver disease -menstrual problems -migraine headaches -an unusual or allergic reaction to levonorgestrel, other medicines, foods, dyes, or preservatives -pregnant or trying to get pregnant -breast-feeding How should I use this medicine? Take this medicine by mouth. Your doctor may want you to use a quick-response pregnancy test prior to using the tablets. Take your medicine as soon as you can after having unprotected sex, preferably in the first 24 hours, but no later than 72 hours (3 days) after the event. Follow the dose instructions of your health care provider exactly. Do not take any extra pills. Extra pills will not decrease your risk of pregnancy, but may increase your risk of side effects. A patient package insert for the product will be given with each prescription and refill. Read this sheet carefully each time. The sheet  may change frequently. Contact your pediatrician regarding the use of this medicine in children. Special care may be needed. This medicine has been used in female children who have started having menstrual periods. Overdosage: If you think you have taken too much of this medicine contact a poison control center or emergency room at once. NOTE: This medicine is only for you. Do not share this medicine with others. What if I miss a dose? If you miss a dose or vomit within 1 hour of taking your dose, you MUST contact your health care professional for instructions. What may interact with this medicine? Do not take this medicine with any of the following medications: -amprenavir -bosentan -fosamprenavir This medicine may also interact with the following medications: -aprepitant -barbiturate medicines for inducing sleep or treating seizures -bexarotene -griseofulvin -medicines to treat seizures like carbamazepine, ethotoin, felbamate, oxcarbazepine, phenytoin, topiramate -modafinil -pioglitazone -rifabutin -rifampin -rifapentine -some medicines to treat HIV infection like atazanavir, indinavir, lopinavir, nelfinavir, tipranavir, ritonavir -St. John's wort -warfarin This list may not describe all possible interactions. Give your health care provider a list of all the medicines, herbs, non-prescription drugs, or dietary supplements you use. Also tell them if you smoke, drink alcohol, or use illegal drugs. Some items may interact with your medicine. What should I watch for while using this medicine? Emergency birth control is not to be used routinely to prevent pregnancy. Discuss birth control options with your health care provider. Make a follow-up appointment to see your health care provider in 3 to 4 weeks after using this medicine. It is common to have spotting after using this medicine.  If you miss your next period, the possibility of pregnancy must be considered. See your health care  professional as soon as you can and get a pregnancy test. Smoking increases the risk of getting a blood clot or having a stroke while you are taking birth control pills, especially if you are more than 35 years old. You are strongly advised not to smoke. This medicine does not protect you against HIV infection (AIDS) or any other sexually transmitted diseases. What side effects may I notice from receiving this medicine? Side effects that you should report to your doctor or health care professional as soon as possible: -Severe side effects are not common. However, the potential for severe side effects may exist and you may want to discuss these with your health care provider. Side effects that usually do not require medical attention (report to your doctor or health care professional if they continue or are bothersome): -abdominal pain or cramping -breast tenderness -dizziness -nausea -spotting This list may not describe all possible side effects. Call your doctor for medical advice about side effects. You may report side effects to FDA at 1-800-FDA-1088. Where should I keep my medicine? Keep out of the reach of children. Store at room temperature between 15 and 30 degrees C (59 and 86 degrees F). Throw away any unused medicine after the expiration date. NOTE: This sheet is a summary. It may not cover all possible information. If you have questions about this medicine, talk to your doctor, pharmacist, or health care provider.  2014, [employer]. (2008-04-09 13:51:24)

## 2013-10-07 NOTE — Progress Notes (Signed)
Pre visit review using our clinic review tool, if applicable. No additional management support is needed unless otherwise documented below in the visit note. 

## 2013-10-07 NOTE — Progress Notes (Signed)
Subjective:     Tamara Ball is a 33 y.o. woman who presents for irregular menses. Patient's last menstrual period was 09/25/2013.  She took Plan B 6 days ago. Began spotting yesterday. Today flow is as normal MC, not heavy. Normally Periods are regular every 28-30 days, lasting 5 days. Last Pap smear 2/14: Nml. Of note, last CBC 2/14 showed anemia: Hbg 8.8. Pt was instructed to start iron supplement. She was to return in 1 week for cbc-pt did not show for lab draw. Pt reports she has been anemic for a long time & saw a hematologist in [place] prior to moving to [place]. She is not consistently taking iron supplements due to SE of constipation.     The following portions of the patient's history were reviewed and updated as appropriate: allergies, current medications, past medical history, past social history, past surgical history and problem list.  Review of Systems Pertinent items are noted in HPI.     Objective:    BP 126/73  Pulse 70  Temp(Src) 97.9 F (36.6 C) (Oral)  Resp 18  Ht 5' 6.25" (1.683 m)  Wt 182 lb (82.555 kg)  BMI 29.15 kg/m2  SpO2 100%  LMP 09/25/2013 General appearance: alert, cooperative, appears stated age and no distress Head: Normocephalic, without obvious abnormality, atraumatic Eyes: negative findings: lids and lashes normal and conjunctivae and sclerae normal    Assessment:    1. History of anemia - CBC - Iron and TIBC - Ferritin  3. Irregular menstrual bleeding Likely r/t recent dose Plan B.   Diagnosis explained in detail. Neurosurgeon distributed. Adv to call if bleeding does not stop within 5-7 days. - POCT urine pregnancy-Neg See pt instructions.

## 2013-10-08 ENCOUNTER — Other Ambulatory Visit: Payer: Self-pay | Admitting: Family Medicine

## 2013-10-08 ENCOUNTER — Encounter: Payer: Self-pay | Admitting: Family Medicine

## 2013-10-08 DIAGNOSIS — D509 Iron deficiency anemia, unspecified: Secondary | ICD-10-CM

## 2013-10-14 ENCOUNTER — Encounter: Payer: Self-pay | Admitting: Family Medicine

## 2013-10-15 MED ORDER — NORGESTIMATE-ETH ESTRADIOL 0.25-35 MG-MCG PO TABS: 1.0000 | ORAL_TABLET | Freq: Every day | ORAL | Status: DC

## 2013-10-15 NOTE — Telephone Encounter (Signed)
Caller name: Jeannemarie Relation to pt: Call back number: [phone]   Reason for call:  Pt did not want to wait 2 days for a response from the email message.  Wanted to call and leave a message as well.

## 2013-10-17 ENCOUNTER — Encounter: Payer: BC Managed Care – PPO | Admitting: Family Medicine

## 2013-11-20 ENCOUNTER — Telehealth: Payer: Self-pay | Admitting: Hematology & Oncology

## 2013-11-20 NOTE — Telephone Encounter (Signed)
Spoke w NEW PATIENT today to remind them of their appointment with Dr. Marin Olp. Also, advised them to bring all medication bottles and insurance card information.   However, she wants to change appt and said she will cb to do so.

## 2013-11-21 ENCOUNTER — Other Ambulatory Visit: Payer: BC Managed Care – PPO | Admitting: Lab

## 2013-11-21 ENCOUNTER — Ambulatory Visit: Payer: BC Managed Care – PPO

## 2013-11-21 ENCOUNTER — Encounter: Payer: BC Managed Care – PPO | Admitting: Family

## 2013-11-24 ENCOUNTER — Other Ambulatory Visit: Payer: Self-pay | Admitting: Family

## 2013-11-24 NOTE — Progress Notes (Signed)
This encounter was created in error - please disregard.

## 2013-12-02 ENCOUNTER — Encounter: Payer: BC Managed Care – PPO | Admitting: Family Medicine

## 2014-01-30 ENCOUNTER — Encounter: Payer: Self-pay | Admitting: Family Medicine

## 2014-02-02 ENCOUNTER — Encounter: Payer: BC Managed Care – PPO | Admitting: Family Medicine

## 2014-02-02 ENCOUNTER — Encounter: Payer: Self-pay | Admitting: Family Medicine

## 2014-02-03 ENCOUNTER — Telehealth: Payer: Self-pay | Admitting: Family Medicine

## 2014-02-03 NOTE — Telephone Encounter (Signed)
Fine w/me-

## 2014-02-03 NOTE — Telephone Encounter (Signed)
Pt would like to switch to dr kim. Pt stated she felt rush and all her questions was not answered. Can I sch?

## 2014-02-03 NOTE — Telephone Encounter (Signed)
I am happy to see her. Would need new patient visit. However, on review of chart it appears she was concerned that a physical and review of 9 problems was not recommended on the same day. It appears she was concerned that there would be a physical and office visit charge. The patient needs to be aware that I agree with Dr. Birdie Riddle on this. The charge is standard and is not something I control. It is difficult to address more then about 3 issues well in one office visit, and often another visit is advised if we can't cover everything in one visit. This is to ensure good care and adequate time to address each issues properly.

## 2014-02-04 NOTE — Telephone Encounter (Signed)
OV made for pt 11/9 at 2:30

## 2014-02-04 NOTE — Telephone Encounter (Signed)
I left a message for the pt to return my call. 

## 2014-02-04 NOTE — Telephone Encounter (Signed)
Patient informed and she was placed on hold to talk with Juliann Pulse to make an appt.

## 2014-02-05 ENCOUNTER — Ambulatory Visit (INDEPENDENT_AMBULATORY_CARE_PROVIDER_SITE_OTHER): Payer: BC Managed Care – PPO | Admitting: Medical

## 2014-02-05 ENCOUNTER — Telehealth: Payer: Self-pay | Admitting: Family Medicine

## 2014-02-05 ENCOUNTER — Encounter: Payer: Self-pay | Admitting: Medical

## 2014-02-05 VITALS — BP 126/77 | HR 88 | Temp 98.2°F | Ht 65.75 in | Wt 184.6 lb

## 2014-02-05 DIAGNOSIS — J309 Allergic rhinitis, unspecified: Secondary | ICD-10-CM | POA: Insufficient documentation

## 2014-02-05 DIAGNOSIS — J3089 Other allergic rhinitis: Secondary | ICD-10-CM

## 2014-02-05 MED ORDER — MONTELUKAST SODIUM 10 MG PO TABS: 10.0000 mg | ORAL_TABLET | Freq: Every day | ORAL | Status: DC

## 2014-02-05 MED ORDER — CEFDINIR 300 MG PO CAPS: 300.0000 mg | ORAL_CAPSULE | Freq: Two times a day (BID) | ORAL | Status: DC

## 2014-02-05 MED ORDER — METHYLPREDNISOLONE ACETATE 40 MG/ML IJ SUSP
40.0000 mg | Freq: Once | INTRAMUSCULAR | Status: AC
  Administered 2014-02-05: 40 mg via INTRAMUSCULAR

## 2014-02-05 MED ORDER — ALBUTEROL SULFATE HFA 108 (90 BASE) MCG/ACT IN AERS: 2.0000 | INHALATION_SPRAY | Freq: Four times a day (QID) | RESPIRATORY_TRACT | Status: DC | PRN

## 2014-02-05 NOTE — Progress Notes (Signed)
Subjective:    Patient ID: Tamara Ball, female    DOB: 09/04/1980, 33 y.o.   MRN: [id number]  HPI  Pt in stating some allergy issues. She is taking claritin and flonase. Pt states year round allergies. She gets about 3 sinus infections per year. One month of sinus pressure recently. On and off possible fever and chills. Feel pnd and some dry cough. Not productive. Occasional light yellow mucous when she blows her nose. Maybe wheezing friend thought they heard he wheeze but she has not felt like. No history of asthma. LMP- this Tuesday.  Past Medical History  Diagnosis Date  . Chicken pox   . Chronic headaches     History   Social History  . Marital Status: Married    Spouse Name: N/A    Number of Children: N/A  . Years of Education: N/A   Occupational History  . Not on file.   Social History Main Topics  . Smoking status: Never Smoker   . Smokeless tobacco: Not on file  . Alcohol Use: Yes     Comment: social  . Drug Use: No  . Sexual Activity: Not on file   Other Topics Concern  . Not on file   Social History Narrative  . No narrative on file    No past surgical history on file.  Family History  Problem Relation Age of Onset  . Hypertension Mother   . Hypertension Maternal Uncle   . Diabetes Maternal Grandfather     No Known Allergies  Current Outpatient Prescriptions on File Prior to Visit  Medication Sig Dispense Refill  . ibuprofen (ADVIL,MOTRIN) 200 MG tablet Take 600-800 mg by mouth every 6 (six) hours as needed for moderate pain.      Marland Kitchen loratadine (CLARITIN) 10 MG tablet Take 10 mg by mouth daily.      . fluconazole (DIFLUCAN) 150 MG tablet Take 1 tablet (150 mg total) by mouth once.  1 tablet  0  . naproxen (NAPROSYN) 500 MG tablet Take 1 tablet (500 mg total) by mouth 2 (two) times daily.  30 tablet  0  . norgestimate-ethinyl estradiol (ORTHO-CYCLEN,SPRINTEC,PREVIFEM) 0.25-35 MG-MCG tablet Take 1 tablet by mouth daily.  1 Package  11   No current  facility-administered medications on file prior to visit.    BP 126/77  Pulse 88  Temp(Src) 98.2 F (36.8 C) (Oral)  Ht 5' 5.75" (1.67 m)  Wt 184 lb 9.6 oz (83.734 kg)  BMI 30.02 kg/m2  SpO2 99%  LMP 02/03/2014      Review of Systems  Constitutional: Negative for fever, chills and fatigue.  HENT: Positive for congestion, ear pain, postnasal drip, rhinorrhea, sinus pressure and sneezing. Negative for trouble swallowing.        Ear pressure but not pain.  Eyes: Negative for pain.  Respiratory: Positive for cough. Negative for chest tightness, shortness of breath and wheezing.        Rare cough. Maybe wheeze. Pt not sure.  Cardiovascular: Negative for chest pain and palpitations.  Gastrointestinal: Negative.   Musculoskeletal: Negative.   Skin: Negative.   Hematological: Negative for adenopathy. Does not bruise/bleed easily.       Objective:   Physical Exam  General  Mental Status - Alert. General Appearance - Well groomed. Not in acute distress.  Skin Rashes- No Rashes.  HEENT Head- Normal. Ear Auditory Canal - Left- Normal. Right - Normal.Tympanic Membrane- Left- Normal. Right- Normal. Eye Sclera/Conjunctiva- Left- Normal. Right- Normal. Nose &  Sinuses Nasal Mucosa- Left-  Boggy + Congested. Right-  Boggy + Congested. Mild maxillary sinus pressure. Mouth & Throat Lips: Upper Lip- Normal: no dryness, cracking, pallor, cyanosis, or vesicular eruption. Lower Lip-Normal: no dryness, cracking, pallor, cyanosis or vesicular eruption. Buccal Mucosa- Bilateral- No Aphthous ulcers. Oropharynx- No Discharge or Erythema. +pnd. Tonsils: Characteristics- Bilateral- No Erythema or Congestion. Size/Enlargement- Bilateral- No enlargement. Discharge- bilateral-None.  Neck Neck- Supple. No Masses.   Chest and Lung Exam Auscultation: Breath Sounds:-Normal. Clear even unlabored.  Cardiovascular Auscultation:Rythm- Regular, rate and rhythm. Murmurs & Other Heart  Sounds:Ausculatation of the heart reveal- No Murmurs.  Lymphatic Head & Neck General Head & Neck Lymphatics: Bilateral: Description- No Localized lymphadenopathy.   Neurologic- CN III-XII grossly intact.          Assessment & Plan:

## 2014-02-05 NOTE — Telephone Encounter (Signed)
Dismissal Letter sent by Certified Mail 18/34/3735  Received the Return Receipt showing someone picked up the Dismissal Letter 02/11/14

## 2014-02-05 NOTE — Assessment & Plan Note (Signed)
Not clear that has sinus infection today. But if  After depo medrol im, montelukast, claritin and fluticasone, her symptoms persist then she can start cefdinir antibiotic which I made available/sent to her pharmacy.

## 2014-02-05 NOTE — Assessment & Plan Note (Signed)
Chronic year round allergies. Continue fluticasone and claritin. Adding montelukast. Pt expresses quick relief from sinus pressure so went ahead. Discussed with her use of depomedrol and she expressed that she has used before and is ok with injection.

## 2014-02-05 NOTE — Patient Instructions (Addendum)
For your year round allergies, I will add montelukast. Continue claritin and fluticasone.  You express sinus pressure now and want quick relief so we gave you depomedrol 40 mg im today.   Over the weekend if your sinus pressure persists(indicating possible sinus infection) then start cefdinir 300 mg #20 1 tab po bid x 10 days.  Also if you do start to wheeze, I am making albuterol available/sent to your pharmacy.  Follow up in 7 days or as needed.

## 2014-02-10 ENCOUNTER — Telehealth: Payer: Self-pay | Admitting: Family Medicine

## 2014-02-10 ENCOUNTER — Encounter (HOSPITAL_BASED_OUTPATIENT_CLINIC_OR_DEPARTMENT_OTHER): Payer: Self-pay | Admitting: Emergency Medicine

## 2014-02-10 ENCOUNTER — Emergency Department (HOSPITAL_BASED_OUTPATIENT_CLINIC_OR_DEPARTMENT_OTHER): Payer: BC Managed Care – PPO

## 2014-02-10 ENCOUNTER — Emergency Department (HOSPITAL_BASED_OUTPATIENT_CLINIC_OR_DEPARTMENT_OTHER)
Admission: EM | Admit: 2014-02-10 | Discharge: 2014-02-10 | Disposition: A | Discharge status: Home / Self Care | Payer: BC Managed Care – PPO | Attending: Emergency Medicine | Admitting: Emergency Medicine

## 2014-02-10 DIAGNOSIS — R079 Chest pain, unspecified: Secondary | ICD-10-CM | POA: Diagnosis not present

## 2014-02-10 DIAGNOSIS — Z791 Long term (current) use of non-steroidal anti-inflammatories (NSAID): Secondary | ICD-10-CM | POA: Insufficient documentation

## 2014-02-10 DIAGNOSIS — R42 Dizziness and giddiness: Secondary | ICD-10-CM | POA: Insufficient documentation

## 2014-02-10 DIAGNOSIS — Z792 Long term (current) use of antibiotics: Secondary | ICD-10-CM | POA: Insufficient documentation

## 2014-02-10 DIAGNOSIS — G8929 Other chronic pain: Secondary | ICD-10-CM | POA: Diagnosis not present

## 2014-02-10 DIAGNOSIS — R0602 Shortness of breath: Secondary | ICD-10-CM | POA: Diagnosis not present

## 2014-02-10 DIAGNOSIS — Z79899 Other long term (current) drug therapy: Secondary | ICD-10-CM | POA: Insufficient documentation

## 2014-02-10 DIAGNOSIS — Z8619 Personal history of other infectious and parasitic diseases: Secondary | ICD-10-CM | POA: Diagnosis not present

## 2014-02-10 DIAGNOSIS — R011 Cardiac murmur, unspecified: Secondary | ICD-10-CM | POA: Diagnosis not present

## 2014-02-10 LAB — CBC WITH DIFFERENTIAL/PLATELET
BASOS ABS: 0 10*3/uL (ref 0.0–0.1)
Basophils Relative: 0 % (ref 0–1)
EOS ABS: 0.4 10*3/uL (ref 0.0–0.7)
Eosinophils Relative: 6 % — ABNORMAL HIGH (ref 0–5)
HEMATOCRIT: 28.3 % — AB (ref 36.0–46.0)
Hemoglobin: 8 g/dL — ABNORMAL LOW (ref 12.0–15.0)
LYMPHS ABS: 1.7 10*3/uL (ref 0.7–4.0)
Lymphocytes Relative: 28 % (ref 12–46)
MCH: 19.3 pg — AB (ref 26.0–34.0)
MCHC: 28.3 g/dL — AB (ref 30.0–36.0)
MCV: 68.4 fL — AB (ref 78.0–100.0)
MONO ABS: 0.6 10*3/uL (ref 0.1–1.0)
Monocytes Relative: 10 % (ref 3–12)
NEUTROS ABS: 3.3 10*3/uL (ref 1.7–7.7)
Neutrophils Relative %: 56 % (ref 43–77)
PLATELETS: 338 10*3/uL (ref 150–400)
RBC: 4.14 MIL/uL (ref 3.87–5.11)
RDW: 18.3 % — AB (ref 11.5–15.5)
WBC: 6 10*3/uL (ref 4.0–10.5)

## 2014-02-10 LAB — BASIC METABOLIC PANEL
ANION GAP: 11 (ref 5–15)
BUN: 11 mg/dL (ref 6–23)
CALCIUM: 9.6 mg/dL (ref 8.4–10.5)
CO2: 26 mEq/L (ref 19–32)
Chloride: 101 mEq/L (ref 96–112)
Creatinine, Ser: 1.1 mg/dL (ref 0.50–1.10)
GFR calc Af Amer: 74 mL/min — ABNORMAL LOW (ref >90)
GFR, EST NON AFRICAN AMERICAN: 64 mL/min — AB (ref >90)
Glucose, Bld: 96 mg/dL (ref 70–99)
Potassium: 4.3 mEq/L (ref 3.7–5.3)
SODIUM: 138 meq/L (ref 137–147)

## 2014-02-10 LAB — TROPONIN I

## 2014-02-10 MED ORDER — MORPHINE SULFATE 4 MG/ML IJ SOLN
4.0000 mg | Freq: Once | INTRAMUSCULAR | Status: DC
  Filled 2014-02-10: qty 1

## 2014-02-10 NOTE — Telephone Encounter (Signed)
To be clear- pt was not refused care at our office.  Pt was appropriately transferred to triage RN when she complained of chest pain and she gave the name of PCP as Dr [name].  Pt was told she could not be seen at [facility] b/c she had not established care and was then advised to go to ER.  Agree w/ ER evaluation.

## 2014-02-10 NOTE — ED Notes (Signed)
Pt discharged to home NAD.  

## 2014-02-10 NOTE — Telephone Encounter (Signed)
Patient Information:  Caller Name: Bevan  Phone: [phone]  Patient: Tamara Ball  Gender: Female  DOB: [birth year]  Age: 33 Years  PCP: [name] (TEXT 1st, after 20 mins can call), [facility] [place])  Pregnant: No  Office Follow Up:  Does the office need to follow up with this patient?: No  Instructions For The Office: N/A  RN Note:  Spoke with Derinda Late at [employer] and was advised that until the patient has her "new patient" appointment with Dr. [name] that she can not be seen at this office. She would still need to see Dr. Paticia Stack or go to the ER. When patient was advised she stated that she had already called Dr. Liana Crocker office and they would not help her since she had asked to be dsimissed as her patient and switch to Dr. [name]. Patient Advised to go to the ER for Evaluation which she agreed to do at this time.  Symptoms  Reason For Call & Symptoms: Patient is calling about the medication she was given on 02/05/14 for her Sinus Problems per the PA. He put her on [place]. Pateint states that today she feels very light headed, heart beating fast and sharp pains in her chest off and on for 3 weeks now & a dull pain right below her neck on the left side towards her shoulder at the same time. She has an appointment on 03/06/14 with Dr. [name] for the Chest Pain issues, but she did not talk to Dr. Paticia Stack about it on 02/05/14. She states that it has intensified over the last few days.She took the Singulair last night and the Omnicef at 7:30 this morning. The symptoms started around 11:00 am.. States that she is hot and needs a fan on her, but does not have a fever. The chest pain lasts about 1 minute and comes every 4-5 minutes - pain is moderate.  Reviewed Health History In EMR: Yes  Reviewed Medications In EMR: Yes  Reviewed Allergies In EMR: Yes  Reviewed Surgeries / Procedures: Yes  Date of Onset of Symptoms: 02/07/2014 OB / GYN:  LMP: Unknown  Guideline(s) Used:  Chest Pain  Disposition Per Guideline:   Go to ED Now  Reason For Disposition Reached:   Pain also present in shoulder(s) or arm(s) or jaw  Advice Given:  N/A  Patient Will Follow Care Advice:  YES

## 2014-02-10 NOTE — Telephone Encounter (Signed)
Pt has not established with Dr [name] yet.  Appt is 11/19.  Forwarded to Dr Birdie Riddle for review

## 2014-02-10 NOTE — ED Notes (Signed)
Pt c/o left sided chest pain/ SOB   while sittings at desk today.

## 2014-02-10 NOTE — ED Provider Notes (Signed)
CSN: [id number]     Arrival date & time 02/10/14  1749 History  This chart was scribed for Tamara Patches, MD by Peyton Bottoms, ED Scribe. This patient was seen in room MH08/MH08 and the patient's care was started at 6:48 PM.  Chief Complaint  Patient presents with  . Chest Pain   Patient is a 33 y.o. female presenting with chest pain. The history is provided by the patient. No language interpreter was used.  Chest Pain Pain location:  L chest Pain quality: aching, crushing and pressure   Pain radiates to:  Does not radiate Pain radiates to the back: no   Pain severity:  Moderate Onset quality:  Gradual Duration:  11 hours Timing:  Constant Progression:  Waxing and waning Context: at rest   Relieved by:  Nothing Worsened by:  Nothing tried Ineffective treatments:  Aspirin and rest Associated symptoms: dizziness and shortness of breath   Associated symptoms: no abdominal pain, no back pain, no cough, no diaphoresis, no fatigue, no fever, no headache, no nausea, no numbness, no palpitations, not vomiting and no weakness   Risk factors: no birth control and no smoking    HPI Comments: Tamara Ball is a 33 y.o. female with a history of heart murmur and chronic headaches, who presents to the Emergency Department complaining of left sided chest pain and shortness of breath that began while patient was sitting at her desk earlier today. Patient states that she was at work today and sitting at her desk 9 hours ago and reports associated lightheadedness, dizziness, tingling in legs, and states that her "body felt hot". Patient states that she tried drinking cold water, turning the fan on, and taking bayer aspirin with minimal relief. Patient reports that she was able to speak 30 minutes after onset of symptoms. Patient states that she felt "like somebody punched me in my chest". She also states that she "felt my heart beating today". Patient states that she went to Urgent Care who told her that  they could not do anything for her and she would need to come to the ER. She states that her CP has been constant since onset today. Patient states that her lightheadedness is intermittent. She no longer feels SOB or dizziness. Patient currently rates her pain as 5/10, with 9/10 at its worst earlier today. Patient denies associated cough, fever, nausea, vomiting. Patient denies history of similar chest pain. She mentioned that her mother is on BP medications and takes preventative medication for cardiac conditions. Patient is not currently taking birth control medications. Patient is a non smoker and drinks alcohol occasionally. Patient has not had any recent travel.  Past Medical History  Diagnosis Date  . Chicken pox   . Chronic headaches    History reviewed. No pertinent past surgical history. Family History  Problem Relation Age of Onset  . Hypertension Mother   . Hypertension Maternal Uncle   . Diabetes Maternal Grandfather    History  Substance Use Topics  . Smoking status: Never Smoker   . Smokeless tobacco: Not on file  . Alcohol Use: Yes     Comment: social   OB History   Grav Para Term Preterm Abortions TAB SAB Ect Mult Living                 Review of Systems  Constitutional: Negative for fever, chills, diaphoresis, activity change, appetite change and fatigue.  HENT: Negative for congestion, facial swelling, rhinorrhea and sore throat.   Eyes:  Negative for photophobia and discharge.  Respiratory: Positive for shortness of breath. Negative for cough and chest tightness.   Cardiovascular: Positive for chest pain. Negative for palpitations and leg swelling.  Gastrointestinal: Negative for nausea, vomiting, abdominal pain and diarrhea.  Endocrine: Negative for polydipsia and polyuria.  Genitourinary: Negative for dysuria, frequency, difficulty urinating and pelvic pain.  Musculoskeletal: Negative for arthralgias, back pain, neck pain and neck stiffness.  Skin: Negative for  color change and wound.  Allergic/Immunologic: Negative for immunocompromised state.  Neurological: Positive for dizziness and light-headedness. Negative for facial asymmetry, weakness, numbness and headaches.  Hematological: Does not bruise/bleed easily.  Psychiatric/Behavioral: Negative for confusion and agitation.   Allergies  Review of patient's allergies indicates no known allergies.  Home Medications   Prior to Admission medications   Medication Sig Start Date End Date Taking? Authorizing Provider  albuterol (PROVENTIL HFA;VENTOLIN HFA) 108 (90 BASE) MCG/ACT inhaler Inhale 2 puffs into the lungs every 6 (six) hours as needed for wheezing or shortness of breath. 02/05/14   Meriam Sprague Saguier, PA-C  cefdinir (OMNICEF) 300 MG capsule Take 1 capsule (300 mg total) by mouth 2 (two) times daily. 02/05/14   Meriam Sprague Saguier, PA-C  fluconazole (DIFLUCAN) 150 MG tablet Take 1 tablet (150 mg total) by mouth once. 09/22/13   Midge Minium, MD  ibuprofen (ADVIL,MOTRIN) 200 MG tablet Take 600-800 mg by mouth every 6 (six) hours as needed for moderate pain.    Historical Provider, MD  loratadine (CLARITIN) 10 MG tablet Take 10 mg by mouth daily.    Historical Provider, MD  montelukast (SINGULAIR) 10 MG tablet Take 1 tablet (10 mg total) by mouth at bedtime. 02/05/14   Meriam Sprague Saguier, PA-C  naproxen (NAPROSYN) 500 MG tablet Take 1 tablet (500 mg total) by mouth 2 (two) times daily. 07/17/13   Tatyana A Kirichenko, PA-C  norgestimate-ethinyl estradiol (ORTHO-CYCLEN,SPRINTEC,PREVIFEM) 0.25-35 MG-MCG tablet Take 1 tablet by mouth daily. 10/15/13   Midge Minium, MD   Triage Vitals: BP 126/65  Pulse 79  Temp(Src) 98.5 F (36.9 C) (Oral)  Resp 16  Ht 5\' 6"  (1.676 m)  Wt 194 lb (87.998 kg)  BMI 31.33 kg/m2  SpO2 100%  LMP 02/03/2014  Physical Exam  Nursing note and vitals reviewed. Constitutional: She is oriented to person, place, and time. She appears well-developed and well-nourished. No  distress.  HENT:  Head: Normocephalic.  Mouth/Throat: Oropharynx is clear and moist.  Eyes: Pupils are equal, round, and reactive to light.  Neck: Neck supple.  Cardiovascular: Normal rate.  An irregular rhythm present.  Murmur heard.  Systolic murmur is present with a grade of 2/6  Pulmonary/Chest: Effort normal and breath sounds normal. No respiratory distress. She has no wheezes.  Abdominal: Soft. She exhibits no distension. There is no tenderness. There is no rebound and no guarding.  Musculoskeletal: She exhibits no edema and no tenderness.  Neurological: She is alert and oriented to person, place, and time.  Skin: Skin is warm and dry.  Psychiatric: She has a normal mood and affect.   ED Course  Procedures (including critical care time)  DIAGNOSTIC STUDIES: Oxygen Saturation is 100% on RA, normal by my interpretation.    COORDINATION OF CARE: 7:26 PM- Discussed plans to order diagnostic CXR, EKG, and lab work. Pt advised of plan for treatment and pt agrees.  Labs Review Labs Reviewed  CBC WITH DIFFERENTIAL - Abnormal; Notable for the following:    Hemoglobin 8.0 (*)    HCT  28.3 (*)    MCV 68.4 (*)    MCH 19.3 (*)    MCHC 28.3 (*)    RDW 18.3 (*)    Eosinophils Relative 6 (*)    All other components within normal limits  BASIC METABOLIC PANEL - Abnormal; Notable for the following:    GFR calc non Af Amer 64 (*)    GFR calc Af Amer 74 (*)    All other components within normal limits  TROPONIN I    Imaging Review Dg Chest 2 View  02/10/2014   CLINICAL DATA:  Left-sided chest pain and shortness of breath since 11 a.m. today.  EXAM: CHEST  2 VIEW  COMPARISON:  None.  FINDINGS: The heart size and mediastinal contours are within normal limits. Both lungs are clear. The visualized skeletal structures are unremarkable.  IMPRESSION: Normal chest x-ray.   Electronically Signed   By: Kalman Jewels M.D.   On: 02/10/2014 18:56     EKG Interpretation   Date/Time:  Tuesday  February 10 2014 17:55:54 EDT Ventricular Rate:  74 PR Interval:  138 QRS Duration: 76 QT Interval:  346 QTC Calculation: 384 R Axis:   77 Text Interpretation:  Sinus rhythm with frequent Premature ventricular  complexes Otherwise normal ECG No prior for comparison Confirmed by  DOCHERTY  MD, MEGAN ([id number]) on 02/10/2014 6:05:31 PM     MDM   Final diagnoses:  Chest pain, unspecified chest pain type    Pt is a 33 y.o. female with Pmhx as above who presents with constant L sided CP since 11:30 am at rest. She initially had assoc SOB, dizziness that has since resolved. No exacerbating or alleviating symptoms. On PE, VSS, pt in NAD. CP not reproducible. +murmur w/ hx of such, HR irregular w/ PVCs. No ischemic changes on EKG. Trop negative. CXR nml. Hg 8 with known anemia and refusal of transfusions in the past. CP resolved after 4mg  IV morphine. Doubt ACS givne age & lack of risk facotrs (TIMI 0), doubt PE (PERC negative, not on OCP despite home med list). Will d/c home with Return precautions given for new or worsening symptoms including return of CP, SOB. She can otherwise f/u with PCP.        I personally performed the services described in this documentation, which was scribed in my presence. The recorded information has been reviewed and is accurate.  Tamara Patches, MD 02/11/14 1250

## 2014-02-10 NOTE — Telephone Encounter (Signed)
This message was routed to Tamara Ball since he was the last one to see pt. Per note sounds as if pt is going to ED. States she has been having chest pains for 2-3 weeks off and on and states that the medication given for sinus infection has worsened them

## 2014-02-10 NOTE — Discharge Instructions (Signed)

## 2014-02-12 ENCOUNTER — Ambulatory Visit (INDEPENDENT_AMBULATORY_CARE_PROVIDER_SITE_OTHER): Payer: BC Managed Care – PPO | Admitting: Emergency Medicine

## 2014-02-12 VITALS — BP 128/70 | HR 92 | Temp 97.8°F | Resp 18 | Ht 66.0 in | Wt 184.4 lb

## 2014-02-12 DIAGNOSIS — J0101 Acute recurrent maxillary sinusitis: Secondary | ICD-10-CM

## 2014-02-12 DIAGNOSIS — D509 Iron deficiency anemia, unspecified: Secondary | ICD-10-CM

## 2014-02-12 DIAGNOSIS — I493 Ventricular premature depolarization: Secondary | ICD-10-CM

## 2014-02-12 DIAGNOSIS — R072 Precordial pain: Secondary | ICD-10-CM

## 2014-02-12 MED ORDER — AMOXICILLIN-POT CLAVULANATE 875-125 MG PO TABS: 1.0000 | ORAL_TABLET | Freq: Two times a day (BID) | ORAL | Status: DC

## 2014-02-12 NOTE — Patient Instructions (Signed)
STOP MOTRIN  Premature Ventricular Contraction Premature ventricular contraction (PVC) is an irregularity of the heart rhythm involving extra or skipped heartbeats. In some cases, they may occur without obvious cause or heart disease. Other times, they can be caused by an electrolyte change in the blood. These need to be corrected. They can also be seen when there is not enough oxygen going to the heart. A common cause of this is plaque or cholesterol buildup. This buildup decreases the blood supply to the heart. In addition, extra beats may be caused or aggravated by:  Excessive smoking.  Alcohol consumption.  Caffeine.  Certain medications  Some street drugs. SYMPTOMS   The sensation of feeling your heart skipping a beat (palpitations).  In many cases, the person may have no symptoms. SIGNS AND TESTS   A physical examination may show an occasional irregularity, but if the PVC beats do not happen often, they may not be found on physical exam.  Blood pressure is usually normal.  Other tests that may find extra beats of the heart are:  An EKG (electrocardiogram)  A Holter monitor which can monitor your heart over longer periods of time  An Angiogram (study of the heart arteries). TREATMENT  Usually extra heartbeats do not need treatment. The condition is treated only if symptoms are severe or if extra beats are very frequent or are causing problems. An underlying cause, if discovered, may also require treatment.  Treatment may also be needed if there may be a risk for other more serious cardiac arrhythmias.  PREVENTION   Moderation in caffeine, alcohol, and tobacco use may reduce the risk of ectopic heartbeats in some people.  Exercise often helps people who lead a sedentary (inactive) lifestyle. PROGNOSIS  PVC heartbeats are generally harmless and do not need treatment.  RISKS AND COMPLICATIONS   Ventricular tachycardia (occasionally).  There usually are no  complications.  Other arrhythmias (occasionally). SEEK IMMEDIATE MEDICAL CARE IF:   You feel palpitations that are frequent or continual.  You develop chest pain or other problems such as shortness of breath, sweating, or nausea and vomiting.  You become light-headed or faint (pass out).  You get worse or do not improve with treatment. Document Released: 12/10/2003 Document Revised: 07/17/2011 Document Reviewed: 06/21/2007 [facility] Patient Information 2015 [place], [place]. This information is not intended to replace advice given to you by your health care provider. Make sure you discuss any questions you have with your health care provider.

## 2014-02-12 NOTE — Progress Notes (Signed)
[facility] [address]  [place] [address] [phone]  Date:  02/12/2014   Name:  Tamara Ball   DOB:  Oct 20, 1980   MRN:  [id number]  PCP:  Annye Asa, MD    Chief Complaint: Chest Pain and Dizziness   History of Present Illness:  Tamara Ball is a 33 y.o. very pleasant female patient who presents with the following:  Patient was working and developed chest pain that she described as being a 9/10 on Tuesday.  She had associated shortness of breath and palpitations. No rapid heart beat.  No nausea or vomiting. No cough.   She was seen in the ER and found to have frequent PVC's.   While in the ER she says her pain diminished to 5/10.  Her lab work was all normal with the exception of a HBG of 8 and abnormal iron studies. She was under treatment by a hematologist in [place] prior to relocating here. Today she had a recurrence of pain in her chest while in a meeting.   She again had an irregular heart beat and shortness of breath She had no perioral or hand tingling or numbness  She is a non smoker Under treatment for sinusitis with omnicef and not improving.  Still has headache she is treating with motrin On OCP. No improvement with over the counter medications or other home remedies.  Denies other complaint or health concern today.   Patient Active Problem List   Diagnosis Date Noted  . Allergic rhinitis 02/05/2014  . Irregular menstrual bleeding 10/07/2013  . UTI (urinary tract infection) 09/19/2013  . Screening for malignant neoplasm of the cervix 06/24/2012  . Routine gynecological examination 06/24/2012  . Unspecified sinusitis (chronic) 06/24/2012  . Bilateral anterior knee pain 06/24/2012    Past Medical History  Diagnosis Date  . Chicken pox   . Chronic headaches     History reviewed. No pertinent past surgical history.  History  Substance Use Topics  . Smoking status: Never Smoker   . Smokeless tobacco: Not on file  .  Alcohol Use: Yes     Comment: social    Family History  Problem Relation Age of Onset  . Hypertension Mother   . Hypertension Maternal Uncle   . Diabetes Maternal Grandfather     No Known Allergies  Medication list has been reviewed and updated.  Current Outpatient Prescriptions on File Prior to Visit  Medication Sig Dispense Refill  . cefdinir (OMNICEF) 300 MG capsule Take 1 capsule (300 mg total) by mouth 2 (two) times daily.  20 capsule  0  . ibuprofen (ADVIL,MOTRIN) 200 MG tablet Take 600-800 mg by mouth every 6 (six) hours as needed for moderate pain.      . montelukast (SINGULAIR) 10 MG tablet Take 1 tablet (10 mg total) by mouth at bedtime.  30 tablet  3  . albuterol (PROVENTIL HFA;VENTOLIN HFA) 108 (90 BASE) MCG/ACT inhaler Inhale 2 puffs into the lungs every 6 (six) hours as needed for wheezing or shortness of breath.  1 Inhaler  0  . fluconazole (DIFLUCAN) 150 MG tablet Take 1 tablet (150 mg total) by mouth once.  1 tablet  0  . loratadine (CLARITIN) 10 MG tablet Take 10 mg by mouth daily.      . naproxen (NAPROSYN) 500 MG tablet Take 1 tablet (500 mg total) by mouth 2 (two) times daily.  30 tablet  0  . norgestimate-ethinyl estradiol (ORTHO-CYCLEN,SPRINTEC,PREVIFEM) 0.25-35 MG-MCG tablet  Take 1 tablet by mouth daily.  1 Package  11   No current facility-administered medications on file prior to visit.    Review of Systems:  As per HPI, otherwise negative.    Physical Examination: Filed Vitals:   02/12/14 1755  BP: 128/70  Pulse: 92  Temp: 97.8 F (36.6 C)  Resp: 18   Filed Vitals:   02/12/14 1755  Height: 5\' 6"  (1.676 m)  Weight: 184 lb 6.4 oz (83.643 kg)   Body mass index is 29.78 kg/(m^2). Ideal Body Weight: Weight in (lb) to have BMI = 25: 154.6  GEN: WDWN, NAD, Non-toxic, A & O x 3 HEENT: Atraumatic, Normocephalic. Neck supple. No masses, No LAD. Ears and Nose: No external deformity. CV: RRR, No M/G/R. No JVD. No thrill. No extra heart  sounds. PULM: CTA B, no wheezes, crackles, rhonchi. No retractions. No resp. distress. No accessory muscle use. ABD: S, NT, ND, +BS. No rebound. No HSM. EXTR: No c/c/e NEURO Normal gait.  PSYCH: Normally interactive. Conversant. Not depressed or anxious appearing.  Calm demeanor.    Assessment and Plan: Frequent PVC's Anemia Dizziness Sinusitis Follow up with hematologist - call tomorrow Cardiology referral  Signed,  Ellison Carwin, MD

## 2014-02-19 ENCOUNTER — Telehealth: Payer: Self-pay

## 2014-02-19 NOTE — Telephone Encounter (Signed)
Told pt she will need to come in and be seen. Pt agreed.

## 2014-02-19 NOTE — Telephone Encounter (Signed)
Patient requesting our office to refer her to a Neurologist for her chronic headaches. Patients call back number is [phone]

## 2014-02-20 ENCOUNTER — Other Ambulatory Visit: Payer: Self-pay

## 2014-02-22 ENCOUNTER — Telehealth: Payer: Self-pay

## 2014-02-22 MED ORDER — FLUCONAZOLE 150 MG PO TABS: 150.0000 mg | ORAL_TABLET | Freq: Once | ORAL | Status: DC

## 2014-02-22 NOTE — Telephone Encounter (Signed)
Patient states she has been prescribed amoxicillin-clavulanate (AUGMENTIN) 875-125 MG per tablet [[id number]] and feels she developed a yeast infection from this med. She would like to be prescribed something for this. She would prefer not to come in. Please advise at [phone]

## 2014-02-22 NOTE — Telephone Encounter (Signed)
Please remind patient that she should RTC if she's still having headache.  Meds ordered this encounter  Medications  . fluconazole (DIFLUCAN) 150 MG tablet    Sig: Take 1 tablet (150 mg total) by mouth once.    Dispense:  1 tablet    Refill:  0    Order Specific Question:  Supervising Provider    Answer:  DOOLITTLE, ROBERT P [[id number]]

## 2014-02-23 NOTE — Telephone Encounter (Signed)
Advised pt via VM script at the pharmacy.

## 2014-03-09 ENCOUNTER — Other Ambulatory Visit: Payer: BC Managed Care – PPO | Admitting: Lab

## 2014-03-09 ENCOUNTER — Ambulatory Visit: Payer: BC Managed Care – PPO

## 2014-03-09 ENCOUNTER — Ambulatory Visit: Payer: BC Managed Care – PPO | Admitting: Family

## 2014-03-12 ENCOUNTER — Telehealth: Payer: Self-pay | Admitting: Hematology & Oncology

## 2014-03-12 NOTE — Telephone Encounter (Signed)
Left vm w NEW PATIENT today to remind them of their appointment with Dr. Ennever. Also, advised them to bring all medication bottles and insurance card information. ° °

## 2014-03-13 ENCOUNTER — Ambulatory Visit (HOSPITAL_BASED_OUTPATIENT_CLINIC_OR_DEPARTMENT_OTHER): Payer: BC Managed Care – PPO | Admitting: Family

## 2014-03-13 ENCOUNTER — Ambulatory Visit: Payer: BC Managed Care – PPO

## 2014-03-13 ENCOUNTER — Other Ambulatory Visit (HOSPITAL_BASED_OUTPATIENT_CLINIC_OR_DEPARTMENT_OTHER): Payer: BC Managed Care – PPO | Admitting: Lab

## 2014-03-13 ENCOUNTER — Encounter: Payer: Self-pay | Admitting: Family

## 2014-03-13 VITALS — BP 122/65 | HR 80 | Temp 98.6°F | Resp 14 | Ht 66.0 in | Wt 186.0 lb

## 2014-03-13 DIAGNOSIS — D509 Iron deficiency anemia, unspecified: Secondary | ICD-10-CM | POA: Insufficient documentation

## 2014-03-13 DIAGNOSIS — D649 Anemia, unspecified: Secondary | ICD-10-CM

## 2014-03-13 LAB — CBC WITH DIFFERENTIAL (CANCER CENTER ONLY)
BASO#: 0.1 10*3/uL (ref 0.0–0.2)
BASO%: 0.6 % (ref 0.0–2.0)
EOS%: 4.2 % (ref 0.0–7.0)
Eosinophils Absolute: 0.4 10*3/uL (ref 0.0–0.5)
HCT: 29.6 % — ABNORMAL LOW (ref 34.8–46.6)
HEMOGLOBIN: 8.3 g/dL — AB (ref 11.6–15.9)
LYMPH#: 1.5 10*3/uL (ref 0.9–3.3)
LYMPH%: 18.2 % (ref 14.0–48.0)
MCH: 19.2 pg — AB (ref 26.0–34.0)
MCHC: 28 g/dL — AB (ref 32.0–36.0)
MCV: 68 fL — ABNORMAL LOW (ref 81–101)
MONO#: 0.6 10*3/uL (ref 0.1–0.9)
MONO%: 7 % (ref 0.0–13.0)
NEUT%: 70 % (ref 39.6–80.0)
NEUTROS ABS: 5.8 10*3/uL (ref 1.5–6.5)
Platelets: 351 10*3/uL (ref 145–400)
RBC: 4.33 10*6/uL (ref 3.70–5.32)
RDW: 17.8 % — AB (ref 11.1–15.7)
WBC: 8.3 10*3/uL (ref 3.9–10.0)

## 2014-03-13 LAB — CHCC SATELLITE - SMEAR

## 2014-03-13 NOTE — Progress Notes (Signed)
Hematology/Oncology Consultation   Name: Tamara Ball      MRN: [id number]    Location: Room/bed info not found  Date: 03/13/2014 Time:3:54 PM   REFERRING PHYSICIAN:  Ellison Carwin  REASON FOR CONSULT:  Iron deficiency anemia   DIAGNOSIS:  Iron deficiency anemia  HISTORY OF PRESENT ILLNESS:  Tamara Ball is a very pleasant 33yo African American female. She recently had lab work done with her PCP that reflected iron deficiency anemia. Today her Hgb is 8.3 and MCV is 68. She is feeling tired and gets SOB with exertion. She also said her legs feel tingly at times. She has heavy cycles that normally last 5 days. She has no personal or family history of bleeding or clotting. Her mother and brother both have the sickle cell trait. Her aunt has cancer but she is unsure of what kind. She has one child. He is healthy. She has never had a miscarriage.  She does not smoke or drink alcohol. 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  She recently started having dizziness, chest pain and hot flashes. She had an EKG done that revealed an irregular heartbeat with frequent PVCs. She is going to see cardiology next week for this.  She denies fever, chills, n/v, cough, rash, mouth sores, ice cravings, headache, palpitations, abdominal pain, constipation, diarrhea, blood in urine or stool. She has had no swelling, tenderness or numbness in her extremities. Her appetite is good and she is drinking plenty of fluids. Her weight is stable.  ROS: All other 10 point review of systems is negative.   PAST MEDICAL HISTORY:   Past Medical History  Diagnosis Date  . Chicken pox   . Chronic headaches     ALLERGIES: No Known Allergies    MEDICATIONS:  Current Outpatient Prescriptions on File Prior to Visit  Medication Sig Dispense Refill  . albuterol (PROVENTIL HFA;VENTOLIN HFA) 108 (90 BASE) MCG/ACT inhaler Inhale 2 puffs into the lungs every 6 (six) hours as needed for wheezing or shortness of breath. 1  Inhaler 0  . ibuprofen (ADVIL,MOTRIN) 200 MG tablet Take 600-800 mg by mouth every 6 (six) hours as needed for moderate pain.    . montelukast (SINGULAIR) 10 MG tablet Take 1 tablet (10 mg total) by mouth at bedtime. 30 tablet 3   No current facility-administered medications on file prior to visit.     PAST SURGICAL HISTORY No past surgical history on file.  FAMILY HISTORY: Family History  Problem Relation Age of Onset  . Hypertension Mother   . Hypertension Maternal Uncle   . Diabetes Maternal Grandfather     SOCIAL HISTORY:  reports that she has never smoked. She has never used smokeless tobacco. She reports that she drinks alcohol. She reports that she does not use illicit drugs.  PERFORMANCE STATUS: The patient's performance status is 1 - Symptomatic but completely ambulatory  PHYSICAL EXAM: Most Recent Vital Signs: Blood pressure 122/65, pulse 80, temperature 98.6 F (37 C), temperature source Oral, resp. rate 14, height _0  (1.676 m), weight 186 lb (84.369 kg). BP 122/65 mmHg  Pulse 80  Temp(Src) 98.6 F (37 C) (Oral)  Resp 14  Ht _1  (1.676 m)  Wt 186 lb (84.369 kg)  BMI 30.04 kg/m2  General Appearance:    Alert, cooperative, no distress, appears stated age  Head:    Normocephalic, without obvious abnormality, atraumatic  Eyes:    PERRL, conjunctiva/corneas clear, EOM's intact, fundi    benign, both eyes  Throat:   Lips, mucosa, and tongue normal; teeth and gums normal  Neck:   Supple, symmetrical, trachea midline, no adenopathy;    thyroid:  no enlargement/tenderness/nodules; no carotid   bruit or JVD  Back:     Symmetric, no curvature, ROM normal, no CVA tenderness  Lungs:     Clear to auscultation bilaterally, respirations unlabored  Chest Wall:    No tenderness or deformity   Heart:    Regular rate and rhythm, S1 and S2 normal, no murmur, rub   or gallop     Abdomen:     Soft, non-tender, bowel sounds active all four quadrants,    no masses, no  organomegaly        Extremities:   Extremities normal, atraumatic, no cyanosis or edema  Pulses:   2+ and symmetric all extremities  Skin:   Skin color, texture, turgor normal, no rashes or lesions  Lymph nodes:   Cervical, supraclavicular, and axillary nodes normal  Neurologic:   CNII-XII intact, normal strength, sensation and reflexes    throughout   LABORATORY DATA:  Results for orders placed or performed in visit on 03/13/14 (from the past 48 hour(s))  CBC with [facility])     Status: Abnormal   Collection Time: 03/13/14  2:39 PM  Result Value Ref Range   WBC 8.3 3.9 - 10.0 10e3/uL   RBC 4.33 3.70 - 5.32 10e6/uL   HGB 8.3 (L) 11.6 - 15.9 g/dL   HCT 29.6 (L) 34.8 - 46.6 %   MCV 68 (L) 81 - 101 fL   MCH 19.2 (L) 26.0 - 34.0 pg   MCHC 28.0 (L) 32.0 - 36.0 g/dL   RDW 17.8 (H) 11.1 - 15.7 %   Platelets 351 145 - 400 10e3/uL   NEUT# 5.8 1.5 - 6.5 10e3/uL   LYMPH# 1.5 0.9 - 3.3 10e3/uL   MONO# 0.6 0.1 - 0.9 10e3/uL   Eosinophils Absolute 0.4 0.0 - 0.5 10e3/uL   BASO# 0.1 0.0 - 0.2 10e3/uL   NEUT% 70.0 39.6 - 80.0 %   LYMPH% 18.2 14.0 - 48.0 %   MONO% 7.0 0.0 - 13.0 %   EOS% 4.2 0.0 - 7.0 %   BASO% 0.6 0.0 - 2.0 %  [facility] - Smear     Status: None   Collection Time: 03/13/14  2:39 PM  Result Value Ref Range   Smear Result Smear Available       RADIOGRAPHY: No results found.     PATHOLOGY: None  ASSESSMENT/PLAN: Tamara Ball is a very pleasant African American female. She recently had lab work done with her PCP that reflected iron deficiency anemia. Today her Hgb is 8.3 and MCV is 68. She is feeling tired and gets SOB with exertion. She also said her legs feel tingly at times. She has heavy cycles that normally last 5 days. Her blood smear and CBC were both reflective of iron deficiency anemia. We will see what the rest of her labs show. We will schedule her for an iron infusion next week. Fereheme 1020.  We will see her back in 6 weeks for labs and  follow-up.  All questions were answered. She knows to call the clinic with any problems, questions or concerns. We can certainly see her much sooner if necessary. The patient was discussed with and also seen by Dr. Marin Olp and he is in agreement with the aforementioned.   [facility]   ADDENDUM:  I saw and examined the  patient was Tamara Ball. I agree with the above. I did get her blood smear. She had microcytic and hypochromic red blood cells. She had some poikilocytosis. There is some anisocytosis. There were no nucleated red cells. White cells appear normal in morphology maturation. There were no hypersegmented polys. There were no atypical lymphocytes. Platelets were adequate in number and size.  We will go ahead and give her IV iron. We will see what her iron studies are. She may need another dose of Feraheme depending on what her iron levels are. Back in June, her ferritin was only 3 with an iron saturation of 2%.  With her heavy cycles, it is certainly conceivable that she will need iron in the future.  Her exam was pretty much unremarkable.  I don't see need for a bone marrow biopsy.  We spent about 45 minutes with her. We answered all her questions. We'll go ahead with the iron infusion. We will get this set up in a week.

## 2014-03-16 ENCOUNTER — Encounter: Payer: Self-pay | Admitting: Family Medicine

## 2014-03-16 ENCOUNTER — Other Ambulatory Visit: Payer: Self-pay | Admitting: Family

## 2014-03-16 ENCOUNTER — Ambulatory Visit (INDEPENDENT_AMBULATORY_CARE_PROVIDER_SITE_OTHER): Payer: BC Managed Care – PPO | Admitting: Family Medicine

## 2014-03-16 VITALS — BP 124/80 | HR 72 | Temp 98.1°F | Ht 66.0 in | Wt 188.8 lb

## 2014-03-16 DIAGNOSIS — M25562 Pain in left knee: Secondary | ICD-10-CM

## 2014-03-16 DIAGNOSIS — Z7689 Persons encountering health services in other specified circumstances: Secondary | ICD-10-CM

## 2014-03-16 DIAGNOSIS — Z23 Encounter for immunization: Secondary | ICD-10-CM

## 2014-03-16 DIAGNOSIS — R002 Palpitations: Secondary | ICD-10-CM

## 2014-03-16 DIAGNOSIS — M25561 Pain in right knee: Secondary | ICD-10-CM

## 2014-03-16 DIAGNOSIS — N920 Excessive and frequent menstruation with regular cycle: Secondary | ICD-10-CM

## 2014-03-16 DIAGNOSIS — Z7189 Other specified counseling: Secondary | ICD-10-CM

## 2014-03-16 DIAGNOSIS — D509 Iron deficiency anemia, unspecified: Secondary | ICD-10-CM

## 2014-03-16 DIAGNOSIS — G43009 Migraine without aura, not intractable, without status migrainosus: Secondary | ICD-10-CM

## 2014-03-16 LAB — IRON AND TIBC CHCC
%SAT: 2 % — ABNORMAL LOW (ref 21–57)
IRON: 12 ug/dL — AB (ref 41–142)
TIBC: 509 ug/dL — ABNORMAL HIGH (ref 236–444)
UIBC: 497 ug/dL — AB (ref 120–384)

## 2014-03-16 LAB — FERRITIN CHCC: Ferritin: <4 ng/ml — ABNORMAL LOW (ref 9–269)

## 2014-03-16 MED ORDER — SUMATRIPTAN SUCCINATE 50 MG PO TABS: 50.0000 mg | ORAL_TABLET | ORAL | Status: DC | PRN

## 2014-03-16 NOTE — Addendum Note (Signed)
Addended by: Agnes Lawrence on: 03/16/2014 03:36 PM   Modules accepted: Orders

## 2014-03-16 NOTE — Patient Instructions (Addendum)
BEFORE YOU LEAVE: -tdap and flu vaccine -xray sheet -schedule lab only apptoinment -follow up in 2 months -patellofemoral exercises  -We placed a referral for you as discussed to the neurologist per your request. It usually takes about 1-2 weeks to process and schedule this referral. If you have not heard from [name] regarding this appointment in 2 weeks please contact our office.  - schedule appointment with the gynecologist  -for knees do the exercises provided 4 days per week  -I am hopeful that a number of your symptoms will improve with treating the anemia.  -will need to wean off of using the ibuprofen - imitrex for migraines  -see the cardiologist as schedule

## 2014-03-16 NOTE — Progress Notes (Signed)
Pre visit review using our clinic review tool, if applicable. No additional management support is needed unless otherwise documented below in the visit note. 

## 2014-03-16 NOTE — Progress Notes (Signed)
HPI:  Tamara Ball is here to establish care.   Last PCP and physical: has been about 1 year since last physical with normal pap per report.  Has the following chronic problems that require follow up and concerns today:  Iron Def Anemia: -hx of chronic significant iron def anemia, reports was seeing hematologist in [place], but never did the treatment -recently established with hematolgist for management -getting iron infusions for this -reports heavy bleeding, changes super tampon every 1-2 hours for first few days of periods -take ibuprofen frequently -admits to fatigue, palpitations, hot flashes -denies: syncope  Frequent palpitations: -seeing cardiologist -scheduled to see a cardiologist -symptoms: palpitations, occ chest pain -denies: swelling, SOB, DOE, CP with activity  Migraines: -started at age 21 -reports used to see a neurologist for a long time in [place] -she wants to establish with a neurologist here as her migraines have worsened again -sinus triggers her headaches, treated with abx but this did not work -reports hx of chronic tension headches and migraines in the past -using ibuprofen daily and worries about chronic rebound headaches - ibuprofen worked in the past -used some imitrex and flexeril in the past and wants refill in on imitrex now  Menorrhagia: -heavy menstrual bleeding with cramping -wants to see a gynecologist -periods regular but heavy the fist 1-2 days  Bilat Knee Pain: -> 1 year -worse with steps -reports told to see ortho by prior PCP -wants referral to Ortho -denies: falls, weakness, numbness, locking   ROS negative for unless reported above: fevers, unintentional weight loss, hearing or vision loss, chest pain, palpitations, struggling to breath, hemoptysis, melena, hematochezia, hematuria, falls, loc, si, thoughts of self harm  Past Medical History  Diagnosis Date  . Chicken pox   . Chronic headaches     No past surgical  history on file.  Family History  Problem Relation Age of Onset  . Hypertension Mother   . Hypertension Maternal Uncle   . Diabetes Maternal Grandfather     History   Social History  . Marital Status: Married    Spouse Name: N/A    Number of Children: N/A  . Years of Education: N/A   Social History Main Topics  . Smoking status: Never Smoker   . Smokeless tobacco: Never Used     Comment: never used tobacco  . Alcohol Use: 0.0 oz/week    0 Not specified per week     Comment: social  . Drug Use: No  . Sexual Activity: Yes    Birth Control/ Protection: None   Other Topics Concern  . None   Social History Narrative   Work or School: higher education - Engineer, site Situation: lives with 35 yo      Spiritual Beliefs: Christian      Lifestyle: no regular exercise; diet is so so          Current outpatient prescriptions: ibuprofen (ADVIL,MOTRIN) 200 MG tablet, Take 600-800 mg by mouth every 6 (six) hours as needed for moderate pain., Disp: , Rfl: ;  loratadine (CLARITIN) 10 MG tablet, Take 10 mg by mouth daily., Disp: , Rfl:  SUMAtriptan (IMITREX) 50 MG tablet, Take 1 tablet (50 mg total) by mouth every 2 (two) hours as needed for migraine or headache. May repeat in 2 hours if headache persists or recurs. No more the 2 in 24 hours., Disp: 10 tablet, Rfl: 0  EXAM:  Filed Vitals:   03/16/14 1415  BP: 124/80  Pulse: 72  Temp: 98.1 F (36.7 C)    Body mass index is 30.49 kg/(m^2).  GENERAL: vitals reviewed and listed above, alert, oriented, appears well hydrated and in no acute distress  HEENT: atraumatic, conjunttiva clear, no obvious abnormalities on inspection of external nose and ears  NECK: no obvious masses on inspection  LUNGS: clear to auscultation bilaterally, no wheezes, rales or rhonchi, good air movement  CV: HRRR, no peripheral edema  MS: moves all extremities without noticeable abnormality.  PSYCH: pleasant and cooperative,  no obvious depression or anxiety  ASSESSMENT AND PLAN:  Discussed the following assessment and plan:  Encounter to establish care - Plan: Lipid panel  Migraine without aura and without status migrainosus, not intractable -advised to decrease and wean of ibuprofen given frequent headaches and anemia -refilled imitrex and referral placed per her request  Iron deficiency anemia -seeing hematologist for this, discussed this may be contributing significantly to her other symptpoms  Heart palpitations - Plan: Lipid panel -scheduled to see cardiologist and she wishes to keep this appt  Menorrhagia with regular cycle - Plan: TSH -checking TSH, she is gong to see a gynecologist  Arthralgia of both knees - Plan: DG Knee Complete 4 Views Left, DG Knee Complete 4 Views Right -suspect PFS, possibly OA -plain films, exercises provided, follow up 2 months  -We reviewed the PMH, PSH, FH, SH, Meds and Allergies. -We provided refills for any medications we will prescribe as needed. -We addressed current concerns per orders and patient instructions. -We have asked for records for pertinent exams, studies, vaccines and notes from previous providers. -We have advised patient to follow up per instructions below. -she wants to return for fasting labs -tdap and flu vaccines today   -Patient advised to return or notify a doctor immediately if symptoms worsen or persist or new concerns arise.  Patient Instructions  BEFORE YOU LEAVE: -tdap and flu vaccine -xray sheet -schedule lab only apptoinment -follow up in 2 months  -We placed a referral for you as discussed to the neurologist per your request. It usually takes about 1-2 weeks to process and schedule this referral. If you have not heard from [name] regarding this appointment in 2 weeks please contact our office.  - schedule appointment with the gynecologist  -for knees do the exercises provided 4 days per week  -I am hopeful that a number of  your symptoms will improve with treating the anemia.  -will need to wean off of using the ibuprofen - imitrex for migraines  -see the cardiologist as schedule          Colin Benton R.

## 2014-03-17 LAB — COMPREHENSIVE METABOLIC PANEL
ALBUMIN: 4.2 g/dL (ref 3.5–5.2)
ALT: <8 U/L (ref 0–35)
AST: 11 U/L (ref 0–37)
Alkaline Phosphatase: 51 U/L (ref 39–117)
BUN: 12 mg/dL (ref 6–23)
CALCIUM: 9.3 mg/dL (ref 8.4–10.5)
CO2: 22 mEq/L (ref 19–32)
Chloride: 103 mEq/L (ref 96–112)
Creatinine, Ser: 0.78 mg/dL (ref 0.50–1.10)
Glucose, Bld: 91 mg/dL (ref 70–99)
Potassium: 4 mEq/L (ref 3.5–5.3)
SODIUM: 136 meq/L (ref 135–145)
TOTAL PROTEIN: 7.4 g/dL (ref 6.0–8.3)
Total Bilirubin: 0.3 mg/dL (ref 0.2–1.2)

## 2014-03-17 LAB — HAPTOGLOBIN: Haptoglobin: 144 mg/dL (ref 45–215)

## 2014-03-17 LAB — VITAMIN B12: Vitamin B-12: 468 pg/mL (ref 211–911)

## 2014-03-17 LAB — RETICULOCYTES (CHCC)
ABS Retic: 39.5 10*3/uL (ref 19.0–186.0)
RBC.: 4.39 MIL/uL (ref 3.87–5.11)
Retic Ct Pct: 0.9 % (ref 0.4–2.3)

## 2014-03-17 LAB — HEMOGLOBINOPATHY EVALUATION
HEMOGLOBIN OTHER: 0 %
HGB A: 98.2 % — AB (ref 96.8–97.8)
Hgb A2 Quant: 1.8 % — ABNORMAL LOW (ref 2.2–3.2)
Hgb F Quant: 0 % (ref 0.0–2.0)
Hgb S Quant: 0 %

## 2014-03-17 LAB — ERYTHROPOIETIN: Erythropoietin: 188 m[IU]/mL — ABNORMAL HIGH (ref 2.6–18.5)

## 2014-03-18 ENCOUNTER — Ambulatory Visit (HOSPITAL_BASED_OUTPATIENT_CLINIC_OR_DEPARTMENT_OTHER): Payer: BC Managed Care – PPO

## 2014-03-18 VITALS — BP 143/75 | HR 89 | Temp 98.6°F | Resp 18

## 2014-03-18 DIAGNOSIS — D509 Iron deficiency anemia, unspecified: Secondary | ICD-10-CM

## 2014-03-18 MED ORDER — SODIUM CHLORIDE 0.9 % IV SOLN
1020.0000 mg | Freq: Once | INTRAVENOUS | Status: DC
Start: 2014-03-18 — End: 2014-03-18

## 2014-03-18 MED ORDER — SODIUM CHLORIDE 0.9 % IV SOLN
1020.0000 mg | Freq: Once | INTRAVENOUS | Status: AC
  Administered 2014-03-18: 1020 mg via INTRAVENOUS
  Filled 2014-03-18: qty 34

## 2014-03-18 MED ORDER — SODIUM CHLORIDE 0.9 % IV SOLN
INTRAVENOUS | Status: DC
  Administered 2014-03-18: 14:00:00 via INTRAVENOUS

## 2014-03-18 NOTE — Patient Instructions (Signed)

## 2014-03-19 ENCOUNTER — Institutional Professional Consult (permissible substitution): Payer: BC Managed Care – PPO | Admitting: Cardiology

## 2014-03-30 ENCOUNTER — Other Ambulatory Visit: Payer: Self-pay | Admitting: *Deleted

## 2014-03-30 ENCOUNTER — Encounter: Payer: Self-pay | Admitting: Family Medicine

## 2014-03-30 ENCOUNTER — Telehealth: Payer: Self-pay | Admitting: Family Medicine

## 2014-03-30 ENCOUNTER — Encounter: Payer: Self-pay | Admitting: Hematology & Oncology

## 2014-03-30 DIAGNOSIS — G43009 Migraine without aura, not intractable, without status migrainosus: Secondary | ICD-10-CM

## 2014-03-30 NOTE — Telephone Encounter (Signed)
Pt is still waiting for referral to neurologist for ? headaches

## 2014-03-31 NOTE — Telephone Encounter (Signed)
Order entered on 11/23.

## 2014-04-14 ENCOUNTER — Institutional Professional Consult (permissible substitution): Payer: BC Managed Care – PPO | Admitting: Cardiology

## 2014-04-21 ENCOUNTER — Telehealth: Payer: Self-pay | Admitting: Neurology

## 2014-04-21 NOTE — Telephone Encounter (Signed)
Pt r/s her NP appt w/ Dr. Tomi Likens on 04/22/14 to 06/05/14. Dr. Kim/ referring provider was notified

## 2014-04-22 ENCOUNTER — Ambulatory Visit: Payer: BC Managed Care – PPO | Admitting: Neurology

## 2014-04-23 ENCOUNTER — Other Ambulatory Visit: Payer: BC Managed Care – PPO

## 2014-04-27 ENCOUNTER — Encounter: Payer: Self-pay | Admitting: Hematology & Oncology

## 2014-04-27 ENCOUNTER — Ambulatory Visit (HOSPITAL_BASED_OUTPATIENT_CLINIC_OR_DEPARTMENT_OTHER): Payer: BC Managed Care – PPO | Admitting: Lab

## 2014-04-27 ENCOUNTER — Ambulatory Visit (HOSPITAL_BASED_OUTPATIENT_CLINIC_OR_DEPARTMENT_OTHER): Payer: BC Managed Care – PPO | Admitting: Hematology & Oncology

## 2014-04-27 ENCOUNTER — Encounter: Payer: Self-pay | Admitting: *Deleted

## 2014-04-27 VITALS — BP 118/64 | HR 79 | Temp 98.4°F | Resp 14 | Ht 66.0 in | Wt 184.0 lb

## 2014-04-27 DIAGNOSIS — D509 Iron deficiency anemia, unspecified: Secondary | ICD-10-CM

## 2014-04-27 LAB — CBC WITH DIFFERENTIAL (CANCER CENTER ONLY)
BASO#: 0 10*3/uL (ref 0.0–0.2)
BASO%: 0.5 % (ref 0.0–2.0)
EOS ABS: 0.5 10*3/uL (ref 0.0–0.5)
EOS%: 6.1 % (ref 0.0–7.0)
HCT: 40.3 % (ref 34.8–46.6)
HGB: 12.6 g/dL (ref 11.6–15.9)
LYMPH#: 1.3 10*3/uL (ref 0.9–3.3)
LYMPH%: 17.1 % (ref 14.0–48.0)
MCH: 25.8 pg — ABNORMAL LOW (ref 26.0–34.0)
MCHC: 31.3 g/dL — ABNORMAL LOW (ref 32.0–36.0)
MCV: 83 fL (ref 81–101)
MONO#: 0.5 10*3/uL (ref 0.1–0.9)
MONO%: 6.9 % (ref 0.0–13.0)
NEUT%: 69.4 % (ref 39.6–80.0)
NEUTROS ABS: 5.1 10*3/uL (ref 1.5–6.5)
PLATELETS: 203 10*3/uL (ref 145–400)
RBC: 4.88 10*6/uL (ref 3.70–5.32)
WBC: 7.4 10*3/uL (ref 3.9–10.0)

## 2014-04-27 NOTE — Progress Notes (Signed)
Hematology and Oncology Follow Up Visit  Tamara Ball [id number] [birth year] 33 y.o. 04/27/2014   Principle Diagnosis:   Iron deficiency anemia-menometrorrhagia  Current Therapy:    IV iron as needed     Interim History:  Ms.  Ball is back for follow-up. We first saw her back in November. At that time, she had a very low iron level. Her ferritin was less than 4. Iron saturation was 2%.  We gave her a dose of IV iron. She had Feraheme at 1020 mg. This made her feel better. She still feels a little tired.  The problem that she has now is that she needs her wisdom teeth taken out on the left side. Apparently she got one infected. Apparently the oral surgeon will not do this until they get our approval. I will send a letter to them. There is a no problems from my standpoint with her having extraction of her wisdom teeth.  She's still having some heavy cycles. I think she is going to go see a gynecologist about this.  She does not chew ice. She has had no bleeding otherwise.  Her weight has been steady. She has not had any problems with her diet. There is no nausea or vomiting.  Her performance status is ECOG 0   Medications: Current outpatient prescriptions: acetaminophen (TYLENOL) 325 MG tablet, Take 650 mg by mouth every 6 (six) hours as needed., Disp: , Rfl: ;  amoxicillin (AMOXIL) 500 MG capsule, Take 500 mg by mouth 4 (four) times daily. Start 12-16---23-15, Disp: , Rfl: ;  ibuprofen (ADVIL,MOTRIN) 200 MG tablet, Take 600-800 mg by mouth every 6 (six) hours as needed for moderate pain., Disp: , Rfl:  loratadine (CLARITIN) 10 MG tablet, Take 10 mg by mouth daily., Disp: , Rfl: ;  SUMAtriptan (IMITREX) 50 MG tablet, Take 1 tablet (50 mg total) by mouth every 2 (two) hours as needed for migraine or headache. May repeat in 2 hours if headache persists or recurs. No more the 2 in 24 hours., Disp: 10 tablet, Rfl: 0  Allergies: No Known Allergies  Past Medical History, Surgical  history, Social history, and Family History were reviewed and updated.  Review of Systems: As above  Physical Exam:  height is 5\' 6"  (1.676 m) and weight is 184 lb (83.462 kg). Her oral temperature is 98.4 F (36.9 C). Her blood pressure is 118/64 and her pulse is 79. Her respiration is 14.   Well-developed and well-nourished African-American female. Head and neck exam shows no ocular or oral lesions. There are no palpable cervical or supraclavicular lymph nodes. Lungs are clear. Cardiac exam regular rate and rhythm with no murmurs, rubs or bruits. Abdomen is soft. She has good bowel sounds. There is no fluid wave. There is no palpable liver or spleen tip. Back exam shows no tenderness over the spine, ribs or hips. Skin exam shows no rashes, ecchymoses or petechia.  Lab Results  Component Value Date   WBC 7.4 04/27/2014   HGB 12.6 04/27/2014   HCT 40.3 04/27/2014   MCV 83 04/27/2014   PLT 203 04/27/2014     Chemistry      Component Value Date/Time   NA 136 03/13/2014 1439   K 4.0 03/13/2014 1439   CL 103 03/13/2014 1439   CO2 22 03/13/2014 1439   BUN 12 03/13/2014 1439   CREATININE 0.78 03/13/2014 1439      Component Value Date/Time   CALCIUM 9.3 03/13/2014 1439   ALKPHOS 51 03/13/2014  1439   AST 11 03/13/2014 1439   ALT <8 03/13/2014 1439   BILITOT 0.3 03/13/2014 1439         Impression and Plan: Tamara Ball is 33 year old African-American female. She has iron deficiency anemia. She has responded very nicely to 1 dose of iron. Her hemoglobin is up 4-1/2 points. Her MCV is come up quite nicely.  It is possible that she still may have some low iron. We will see what the values are.  I think we've probably get her back in about 3 months.  Again, I don't see a problem with her having her wisdom teeth taken out. Per graph sheet by has go to cardiology because she's been having some palpitations. She is supposed to see cardiology already but has not.   Tamara Napoleon,  MD 12/21/20156:19 PM

## 2014-04-28 ENCOUNTER — Other Ambulatory Visit (INDEPENDENT_AMBULATORY_CARE_PROVIDER_SITE_OTHER): Payer: BC Managed Care – PPO

## 2014-04-28 DIAGNOSIS — E039 Hypothyroidism, unspecified: Secondary | ICD-10-CM

## 2014-04-28 DIAGNOSIS — E785 Hyperlipidemia, unspecified: Secondary | ICD-10-CM

## 2014-04-28 LAB — IRON AND TIBC CHCC
%SAT: 17 % — AB (ref 21–57)
IRON: 62 ug/dL (ref 41–142)
TIBC: 360 ug/dL (ref 236–444)
UIBC: 298 ug/dL (ref 120–384)

## 2014-04-28 LAB — LIPID PANEL
CHOL/HDL RATIO: 3
Cholesterol: 109 mg/dL (ref 0–200)
HDL: 39.6 mg/dL (ref >39.00)
LDL CALC: 61 mg/dL (ref 0–99)
NONHDL: 69.4
Triglycerides: 41 mg/dL (ref 0.0–149.0)
VLDL: 8.2 mg/dL (ref 0.0–40.0)

## 2014-04-28 LAB — FERRITIN CHCC: FERRITIN: 33 ng/mL (ref 9–269)

## 2014-04-29 ENCOUNTER — Other Ambulatory Visit: Payer: Self-pay | Admitting: Family

## 2014-04-29 LAB — TSH: TSH: 2.46 u[IU]/mL (ref 0.35–4.50)

## 2014-05-04 ENCOUNTER — Telehealth: Payer: Self-pay | Admitting: Hematology & Oncology

## 2014-05-04 NOTE — Telephone Encounter (Signed)
Per in basket I left pt message to call. It appears Baxter Flattery schedule 12-30 iron but then cx. I did not see a note. Per NP pt needs iron

## 2014-05-06 ENCOUNTER — Ambulatory Visit: Payer: BC Managed Care – PPO

## 2014-05-25 ENCOUNTER — Ambulatory Visit (INDEPENDENT_AMBULATORY_CARE_PROVIDER_SITE_OTHER)
Admission: RE | Admit: 2014-05-25 | Discharge: 2014-05-25 | Disposition: A | Discharge status: Home / Self Care | Payer: BC Managed Care – PPO | Source: Ambulatory Visit | Attending: Family Medicine | Admitting: Family Medicine

## 2014-05-25 ENCOUNTER — Ambulatory Visit (INDEPENDENT_AMBULATORY_CARE_PROVIDER_SITE_OTHER): Payer: BC Managed Care – PPO | Admitting: Family Medicine

## 2014-05-25 ENCOUNTER — Encounter: Payer: Self-pay | Admitting: Family Medicine

## 2014-05-25 VITALS — BP 130/80 | HR 80 | Temp 97.8°F | Wt 186.0 lb

## 2014-05-25 DIAGNOSIS — D509 Iron deficiency anemia, unspecified: Secondary | ICD-10-CM

## 2014-05-25 DIAGNOSIS — N926 Irregular menstruation, unspecified: Secondary | ICD-10-CM

## 2014-05-25 DIAGNOSIS — M25562 Pain in left knee: Secondary | ICD-10-CM

## 2014-05-25 DIAGNOSIS — M25561 Pain in right knee: Secondary | ICD-10-CM

## 2014-05-25 DIAGNOSIS — G43809 Other migraine, not intractable, without status migrainosus: Secondary | ICD-10-CM

## 2014-05-25 DIAGNOSIS — R002 Palpitations: Secondary | ICD-10-CM

## 2014-05-25 NOTE — Progress Notes (Signed)
Pre visit review using our clinic review tool, if applicable. No additional management support is needed unless otherwise documented below in the visit note. 

## 2014-05-25 NOTE — Patient Instructions (Signed)
For the knee: -get xrays and do the exercises at least 4 days per week -call in 4 weeks to let us know how this is going  Please schedule an appointment with our gynecologist to evaluated the heavy bleeding

## 2014-05-25 NOTE — Progress Notes (Signed)
HPI:  Bilat Knee Pain: -> 1 year -worse with steps -advised plain films, HEP, supportive tx for suspected PFS 03/2014 -she did not do xrays or exercises, feels about the same -denies: falls, weakness, numbness, locking  Iron Def Anemia: -hx of chronic significant iron def anemia -saw hematologist in [place] and now here for iron infusion, anemia resolved last check -hx menometrorrhagia and ibuprofen use -admits to fatigue, palpitations, hot flashes -denies: syncope  Frequent palpitations: -improved with iron infusion as suspected -advised could cancel cards eval but she she reports she wants to keep appt for eval murmur -symptoms: palpitations, occ chest pain -denies: swelling, SOB, DOE, CP with activity  Migraines: -started at age 51, dx with migraines and saw neurologist in the past -scheduled to see Dr. Tomi Likens in neurology -advised limiting OTC analgesic use and refilled triptan last visit - she has weaned of analgesics without improvement, imitrex helps a little -denies: worsening has  Menorrhagia: -heavy menstrual bleeding with cramping -reported she was going to see gyn for this last visit -periods regular but heavy     ROS: See pertinent positives and negatives per HPI.  Past Medical History  Diagnosis Date  . Chicken pox   . Chronic headaches     No past surgical history on file.  Family History  Problem Relation Age of Onset  . Hypertension Mother   . Hypertension Maternal Uncle   . Diabetes Maternal Grandfather     History   Social History  . Marital Status: Married    Spouse Name: N/A    Number of Children: N/A  . Years of Education: N/A   Social History Main Topics  . Smoking status: Never Smoker   . Smokeless tobacco: Never Used     Comment: never used tobacco  . Alcohol Use: 0.0 oz/week    0 Not specified per week     Comment: social  . Drug Use: No  . Sexual Activity: Yes    Birth Control/ Protection: None   Other Topics Concern   . 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  acetaminophen (TYLENOL) 325 MG tablet, Take 650 mg by mouth every 6 (six) hours as needed., Disp: , Rfl:  .  ibuprofen (ADVIL,MOTRIN) 200 MG tablet, Take 600-800 mg by mouth every 6 (six) hours as needed for moderate pain., Disp: , Rfl:  .  loratadine (CLARITIN) 10 MG tablet, Take 10 mg by mouth daily., Disp: , Rfl:  .  SUMAtriptan (IMITREX) 50 MG tablet, Take 1 tablet (50 mg total) by mouth every 2 (two) hours as needed for migraine or headache. May repeat in 2 hours if headache persists or recurs. No more the 2 in 24 hours., Disp: 10 tablet, Rfl: 0  EXAM:  Filed Vitals:   05/25/14 1259  BP: 130/80  Pulse: 80  Temp: 97.8 F (36.6 C)    Body mass index is 30.04 kg/(m^2).  GENERAL: vitals reviewed and listed above, alert, oriented, appears well hydrated and in no acute distress  HEENT: atraumatic, conjunttiva clear, no obvious abnormalities on inspection of external nose and ears  NECK: no obvious masses on inspection  LUNGS: clear to auscultation bilaterally, no  wheezes, rales or rhonchi, good air movement  CV: HRRR, murmur present, no peripheral edema  MS: moves all extremities without noticeable abnormality -normal inspection -normal strength and ROM -special test: + petellar crepitus, + J sign, neg vl/var stress, neg lachman, neg drawer, neg mcmurray -NV intact distal  PSYCH: pleasant and cooperative, no obvious depression or anxiety  ASSESSMENT AND PLAN:  Discussed the following assessment and plan:  Arthralgia of both knees -suspect PFS, advised plain films to exclude OA and HEP - she did not do these. Reiterated plan and advised she call in 1 month to let us know how this is going.  Iron  deficiency anemia Resolved with iron infusion. Advised she gyn for eval of her menometrorrhagia.  Irregular menstrual bleeding -see above  Other migraine without status migrainosus, not intractable -weaned of frequent analgesic use, using imitrex prn, still with frequent migraines and seeing Dr. Tomi Likens this week per her report  Palpitations -palpitations resolved, but she still wants to see cards regarding her murmur   -Patient advised to return or notify a doctor immediately if symptoms worsen or persist or new concerns arise.  Patient Instructions  For the knee: -get xrays and do the exercises at least 4 days per week -call in 4 weeks to let us know how this is going  Please schedule an appointment with our gynecologist to evaluated the heavy bleeding      KIM, HANNAH R.

## 2014-06-01 ENCOUNTER — Encounter: Payer: Self-pay | Admitting: Cardiology

## 2014-06-01 ENCOUNTER — Ambulatory Visit (INDEPENDENT_AMBULATORY_CARE_PROVIDER_SITE_OTHER): Payer: BC Managed Care – PPO | Admitting: Cardiology

## 2014-06-01 VITALS — BP 104/68 | HR 78 | Ht 66.0 in | Wt 188.6 lb

## 2014-06-01 DIAGNOSIS — R079 Chest pain, unspecified: Secondary | ICD-10-CM

## 2014-06-01 DIAGNOSIS — D500 Iron deficiency anemia secondary to blood loss (chronic): Secondary | ICD-10-CM | POA: Insufficient documentation

## 2014-06-01 DIAGNOSIS — N920 Excessive and frequent menstruation with regular cycle: Secondary | ICD-10-CM | POA: Insufficient documentation

## 2014-06-01 DIAGNOSIS — R002 Palpitations: Secondary | ICD-10-CM

## 2014-06-01 DIAGNOSIS — R011 Cardiac murmur, unspecified: Secondary | ICD-10-CM

## 2014-06-01 NOTE — Patient Instructions (Addendum)
Your physician has requested that you have an echocardiogram. Echocardiography is a painless test that uses sound waves to create images of your heart. It provides your doctor with information about the size and shape of your heart and how well your heart's chambers and valves are working. This procedure takes approximately one hour. There are no restrictions for this procedure.  Your physician has requested that you have an exercise tolerance test. For further information please visit [email or web address]. Please also follow instruction sheet, as given.  Your physician has recommended that you wear an event monitor. Event monitors are medical devices that record the heart's electrical activity. Doctors most often [name] these monitors to diagnose arrhythmias. Arrhythmias are problems with the speed or rhythm of the heartbeat. The monitor is a small, portable device. You can wear one while you do your normal daily activities. This is usually used to diagnose what is causing palpitations/syncope (passing out).  Your physician recommends that you schedule a follow-up appointment AS NEEDED with Dr. Radford Pax pending your tests.

## 2014-06-01 NOTE — Progress Notes (Signed)
Cardiology Office Note   Date:  06/01/2014   ID:  Tamara Ball, DOB Jul 25, 1980, MRN [id number]  PCP:  [name]., DO  Cardiologist:   Sueanne Margarita, MD   Chief Complaint  Patient presents with  . Advice Only    heart palpations/surgical clerance      History of Present Illness: Tamara Ball is a 34 y.o. female who presents for evaluation of palpitations for surgical clearance.  She apparently has been having palpitations but is also has iron deficiency anemia due to heavy periods and palpitations have improved with initiation of iron and her anemia has resolved.  She was noted to have a heart murmur on exam and is now referred for further evaluation.   She says that she still will have occasional episodes of feeling that her heart is racing.  She says that it occurs a few time weekly and usually last about 2-3 minutes.  She denies any SOB or DOE.  Occasionally she will have a sharp pain in her chest that is very brief and is nonexertional occurring on the left side.  She says that about 2 months ago prior to the iron infusion she became very hot and sweaty with dizziness but no syncope.  This lasted about 15 minutes.  She had another episode a few days later.  Both occurred while at work.   She went to urgent Care and was referred to ER and EKG showed NSR with PAC's and PVC's and nonspecific ST abnormality.  Since then she has not had any further spells.  Apparently she has been told in the past that she had a heart murmur.   She is going to have wisdom teeth extracted and root canal and needs preoperative clearance.      Past Medical History  Diagnosis Date  . Chicken pox   . Chronic headaches   . Menorrhagia   . Iron deficiency anemia due to chronic blood loss     from heavy periods    History reviewed. No pertinent past surgical history.   Current Outpatient Prescriptions  Medication Sig Dispense Refill  . acetaminophen (TYLENOL) 325 MG tablet Take 650 mg by mouth  every 6 (six) hours as needed for mild pain.     Marland Kitchen ibuprofen (ADVIL,MOTRIN) 200 MG tablet Take 600-800 mg by mouth every 6 (six) hours as needed for moderate pain.    Marland Kitchen loratadine (CLARITIN) 10 MG tablet Take 10 mg by mouth daily.     . SUMAtriptan (IMITREX) 50 MG tablet Take 1 tablet (50 mg total) by mouth every 2 (two) hours as needed for migraine or headache. May repeat in 2 hours if headache persists or recurs. No more the 2 in 24 hours. (Patient taking differently: Take 50 mg by mouth every 2 (two) hours as needed for migraine or headache. May repeat in 2 hours if headache persists or recurs. No more the 2 in 24 hours.) 10 tablet 0   No current facility-administered medications for this visit.    Allergies:   Review of patient's allergies indicates no known allergies.    Social History:  The patient  reports that she has never smoked. She has never used smokeless tobacco. She reports that she drinks alcohol. She reports that she does not use illicit drugs.   Family History:  The patient's family history includes Diabetes in her maternal grandfather; Hypertension in her maternal uncle and mother.    ROS:  Please see the history of present  illness.   Otherwise, review of systems are positive for none.   All other systems are reviewed and negative.    PHYSICAL EXAM: VS:  BP 104/68 mmHg  Pulse 78  Ht 5\' 6"  (1.676 m)  Wt 188 lb 9.6 oz (85.548 kg)  BMI 30.46 kg/m2  SpO2 99%  LMP 04/28/2014 , BMI Body mass index is 30.46 kg/(m^2). GEN: Well nourished, well developed, in no acute distress HEENT: normal Neck: no JVD, carotid bruits, or masses Cardiac: RRR; no murmurs, rubs, or gallops,no edema  Respiratory:  clear to auscultation bilaterally, normal work of breathing GI: soft, nontender, nondistended, + BS MS: no deformity or atrophy Skin: warm and dry, no rash Neuro:  Strength and sensation are intact Psych: euthymic mood, full affect   EKG:  EKG is not ordered today.   Recent  Labs: 03/13/2014: ALT <8; BUN 12; Creatinine 0.78; Potassium 4.0; Sodium 136 04/27/2014: Hemoglobin 12.6; Platelets 203 04/28/2014: TSH 2.46    Lipid Panel    Component Value Date/Time   CHOL 109 04/28/2014 0957   TRIG 41.0 04/28/2014 0957   HDL 39.60 04/28/2014 0957   CHOLHDL 3 04/28/2014 0957   VLDL 8.2 04/28/2014 0957   LDLCALC 61 04/28/2014 0957      Wt Readings from Last 3 Encounters:  06/01/14 188 lb 9.6 oz (85.548 kg)  05/25/14 186 lb (84.369 kg)  04/27/14 184 lb (83.462 kg)      Other studies Reviewed: Additional studies/ records that were reviewed today include: EKG from 02/12/2014. Review of the above records demonstrates: NSR with PAC's and PVC's and nonspecific ST abnormality   ASSESSMENT AND PLAN:  1.  Heart murmur - I do not hear a murmur today.  I will get a 2D echo to assess LVF and murmur. 2.  Palpitations - EKG showed NSR with PAC's and PVC's - I will get a 30 day heart monitor to assess 3.  Chest pain that is atypical and sharp with nonspecific ST abnormality on EKG.  I will get an ETT to assess further.   4.  PAC's and PVC's - I have reassured her that these are benign.  I will assess LVF with echo and get ETT.   Current medicines are reviewed at length with the patient today.  The patient does not have concerns regarding medicines.  The following changes have been made:  no change  Labs/ tests ordered today include: 2D echo, ETT, 30 day event monitor  No orders of the defined types were placed in this encounter.     Disposition:   FU with me in  PRN pending results of studies   Signed, Sueanne Margarita, MD  06/01/2014 2:19 PM    [facility] [address], [place], [place]  [address] Phone: [phone]; Fax: [phone]

## 2014-06-04 ENCOUNTER — Ambulatory Visit (HOSPITAL_COMMUNITY): Payer: BC Managed Care – PPO | Attending: Cardiology | Admitting: Radiology

## 2014-06-04 ENCOUNTER — Encounter (INDEPENDENT_AMBULATORY_CARE_PROVIDER_SITE_OTHER): Payer: BC Managed Care – PPO

## 2014-06-04 ENCOUNTER — Encounter: Payer: Self-pay | Admitting: *Deleted

## 2014-06-04 DIAGNOSIS — R079 Chest pain, unspecified: Secondary | ICD-10-CM

## 2014-06-04 DIAGNOSIS — E669 Obesity, unspecified: Secondary | ICD-10-CM | POA: Diagnosis not present

## 2014-06-04 DIAGNOSIS — R002 Palpitations: Secondary | ICD-10-CM

## 2014-06-04 NOTE — Progress Notes (Signed)
Echocardiogram performed.  

## 2014-06-04 NOTE — Progress Notes (Signed)
Patient ID: Tamara Ball, female   DOB: Sep 19, 1980, 34 y.o.   MRN: [id number] [employer] 30 day cardiac event monitor applied to patient.

## 2014-06-05 ENCOUNTER — Ambulatory Visit (INDEPENDENT_AMBULATORY_CARE_PROVIDER_SITE_OTHER): Payer: BC Managed Care – PPO | Admitting: Neurology

## 2014-06-05 ENCOUNTER — Encounter: Payer: Self-pay | Admitting: Neurology

## 2014-06-05 VITALS — BP 118/68 | HR 80 | Resp 16 | Ht 66.0 in | Wt 186.3 lb

## 2014-06-05 DIAGNOSIS — G44221 Chronic tension-type headache, intractable: Secondary | ICD-10-CM

## 2014-06-05 DIAGNOSIS — G43009 Migraine without aura, not intractable, without status migrainosus: Secondary | ICD-10-CM

## 2014-06-05 MED ORDER — NORTRIPTYLINE HCL 10 MG PO CAPS: 10.0000 mg | ORAL_CAPSULE | Freq: Every day | ORAL | Status: DC

## 2014-06-05 NOTE — Patient Instructions (Signed)
1.  Start nortriptyline 10mg  at bedtime.  Call in 4 weeks with update and we can adjust dose if needed 2.  Limit use of Tylenol to no more than 2 days out of the week 3.  Start routine (daily if possible) exercise 4.  Increase water intake and improve diet 5.  Follow sleep instructions 6.  Stop caffeine 7.  Keep headache diary 8.  Call in 4 weeks with update.  Follow up in 3 months.

## 2014-06-05 NOTE — Progress Notes (Signed)
NEUROLOGY CONSULTATION NOTE  Tamara Ball MRN: [id number] DOB: 01-11-1981  Referring provider: Dr. [name] Primary care provider: Dr. [name]  Reason for consult:  Headache  HISTORY OF PRESENT ILLNESS: Tamara Ball is a 34 year old right-handed woman with migraines, iron deficiency anemia, who presents for chronic tension type headaches and episodic migraines.  Records reviewed.  TENSION-TYPE HEADACHES: Onset:  50-77 years old Location:  Top of head and bi-temporal, behind left eye Quality:  Pressure-like, sharp pain on top of head Intensity:  5-6/10 Aura:  no Prodrome:  no Associated symptoms:  none Duration:  constant Frequency:  constant Triggers/exacerbating factors:  sinus congestion Relieving factors:  none Activity:  Able to function  Past abortive therapy:  Tylenol Past preventative therapy:  Massage therapy, cyclobenzaprine at night  Current abortive therapy:  Tylenol 650mg  once or twice a week (up until 2 months ago was taking Advil 600-800mg  daily.  She was told to stop due to iron-deficiency anemia and questionable GI bleed.  Work up has revealed no bleed). Current preventative therapy:  None  MIGRAINE: Onset:  18-19 Location:  Top of head Quality:  Sharp  Intensity:  8/10 Aura:  no Prodrome:  no Associated symptoms:  Dizziness, nausea, photophobia Duration:  2 hours Frequency:  Once a month Triggers/exacerbating factors:  sinus congestion Relieving factors:  none Activity:  Lays down in dark  Past abortive therapy:  Advil, Tylenol Past preventative therapy:  none  Current abortive therapy:  Sumatriptan 50mg  Current preventative therapy:  none    Caffeine:  No daily caffeine Alcohol:  no Smoker:  no Diet:  Needs to drink more water Exercise:  Not routine Depression/stress:  Stress related to work (works as Mudlogger for [employer]) Sleep hygiene:  poor Family history of headache:  Father's side unknown.  Her son has headaches.  No known  family history of intracranial aneurysms.  She says she had a CT head at least 4 years ago and was reportedly unremarkable.  PAST MEDICAL HISTORY: Past Medical History  Diagnosis Date  . Chicken pox   . Chronic headaches   . Menorrhagia   . Iron deficiency anemia due to chronic blood loss     from heavy periods    PAST SURGICAL HISTORY: No past surgical history on file.  MEDICATIONS: Current Outpatient Prescriptions on File Prior to Visit  Medication Sig Dispense Refill  . acetaminophen (TYLENOL) 325 MG tablet Take 650 mg by mouth every 6 (six) hours as needed for mild pain.     Marland Kitchen ibuprofen (ADVIL,MOTRIN) 200 MG tablet Take 600-800 mg by mouth every 6 (six) hours as needed for moderate pain.    Marland Kitchen loratadine (CLARITIN) 10 MG tablet Take 10 mg by mouth daily.     . SUMAtriptan (IMITREX) 50 MG tablet Take 1 tablet (50 mg total) by mouth every 2 (two) hours as needed for migraine or headache. May repeat in 2 hours if headache persists or recurs. No more the 2 in 24 hours. (Patient taking differently: Take 50 mg by mouth every 2 (two) hours as needed for migraine or headache. May repeat in 2 hours if headache persists or recurs. No more the 2 in 24 hours.) 10 tablet 0   No current facility-administered medications on file prior to visit.    ALLERGIES: No Known Allergies  FAMILY HISTORY: Family History  Problem Relation Age of Onset  . Hypertension Mother   . Hypertension Maternal Uncle   . Diabetes Maternal Grandfather  SOCIAL HISTORY: History   Social History  . Marital Status: Married    Spouse Name: N/A    Number of Children: N/A  . Years of Education: N/A   Occupational History  . Not on file.   Social History Main Topics  . Smoking status: Never Smoker   . Smokeless tobacco: Never Used     Comment: never used tobacco  . Alcohol Use: 0.0 oz/week    0 Not specified per week     Comment: social  . Drug Use: No  . Sexual Activity:    Partners: Male    Publishing copy Protection: None   Other Topics Concern  . 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      Spiritual Beliefs: Christian      Lifestyle: no regular exercise; diet is so so          REVIEW OF SYSTEMS: Constitutional: No fevers, chills, or sweats, no generalized fatigue, change in appetite Eyes: No visual changes, double vision, eye pain Ear, nose and throat: No hearing loss, ear pain, nasal congestion, sore throat Cardiovascular: No chest pain, palpitations Respiratory:  No shortness of breath at rest or with exertion, wheezes GastrointestinaI: No nausea, vomiting, diarrhea, abdominal pain, fecal incontinence Genitourinary:  No dysuria, urinary retention or frequency Musculoskeletal:  No neck pain, back pain Integumentary: No rash, pruritus, skin lesions Neurological: as above Psychiatric: Stress, sleep problems Endocrine: No palpitations, fatigue, diaphoresis, mood swings, change in appetite, change in weight, increased thirst Hematologic/Lymphatic:  Iron deficient anemia. Allergic/Immunologic: no itchy/runny eyes, nasal congestion, recent allergic reactions, rashes  PHYSICAL EXAM: Filed Vitals:   06/05/14 0832  BP: 118/68  Pulse: 80  Resp: 16   General: No acute distress Head:  Normocephalic/atraumatic Eyes:  fundi unremarkable, without vessel changes, exudates, hemorrhages or papilledema. Neck: supple, no paraspinal tenderness, full range of motion Back: No paraspinal tenderness Heart: regular rate and rhythm Lungs: Clear to auscultation bilaterally. Vascular: No carotid bruits. Neurological Exam: Mental status: alert and oriented to person, place, and time, recent and remote memory intact, fund of knowledge intact, attention and concentration intact, speech fluent and not dysarthric, language intact. Cranial nerves: CN I: not tested CN II: pupils equal, round  and reactive to light, visual fields intact, fundi unremarkable, without vessel changes, exudates, hemorrhages or papilledema. CN III, IV, VI:  full range of motion, no nystagmus, no ptosis CN V: facial sensation intact CN VII: upper and lower face symmetric CN VIII: hearing intact CN IX, X: gag intact, uvula midline CN XI: sternocleidomastoid and trapezius muscles intact CN XII: tongue midline Bulk & Tone: normal, no fasciculations. Motor:  5/5 throughout Sensation:  Pinprick and vibration intact Deep Tendon Reflexes:  2+ throughout, toes downgoing Finger to nose testing:  No dysmetria Heel to shin:  No dysmetria Gait:  Normal station and stride.  Able to turn and walk in tandem. Romberg negative.  IMPRESSION: Chronic tension-type headaches Episodic migraine without aura  PLAN: 1.  Start nortriptyline 10mg  2.  Tylenol for abortive therapy for tension headache 3.  Sumatriptan for migraine 4.  Increase water intake 5.  Routine exercise 6.  Sleep hygiene 7.  Call in 4 weeks.  Follow up in 3 months.  Thank you for allowing me to take part in the care of this patient.  Metta Clines, DO  CC:  Colin Benton,  DO

## 2014-07-15 ENCOUNTER — Encounter: Payer: BC Managed Care – PPO | Admitting: Physician Assistant

## 2014-07-16 ENCOUNTER — Telehealth: Payer: Self-pay | Admitting: Cardiology

## 2014-07-16 NOTE — Telephone Encounter (Signed)
Please let patient know that heart monitor showed NSR with HR ranging from 64-120bpm

## 2014-07-17 NOTE — Telephone Encounter (Signed)
Informed patient of results and verbal understanding expressed.  

## 2014-07-27 ENCOUNTER — Encounter: Payer: Self-pay | Admitting: Family

## 2014-07-27 ENCOUNTER — Ambulatory Visit (HOSPITAL_BASED_OUTPATIENT_CLINIC_OR_DEPARTMENT_OTHER): Payer: BC Managed Care – PPO | Admitting: Family

## 2014-07-27 ENCOUNTER — Other Ambulatory Visit (HOSPITAL_BASED_OUTPATIENT_CLINIC_OR_DEPARTMENT_OTHER): Payer: BC Managed Care – PPO | Admitting: Lab

## 2014-07-27 VITALS — BP 139/88 | HR 71 | Temp 97.8°F | Resp 18 | Wt 193.0 lb

## 2014-07-27 DIAGNOSIS — N922 Excessive menstruation at puberty: Secondary | ICD-10-CM

## 2014-07-27 DIAGNOSIS — D509 Iron deficiency anemia, unspecified: Secondary | ICD-10-CM | POA: Diagnosis not present

## 2014-07-27 LAB — CBC WITH DIFFERENTIAL (CANCER CENTER ONLY)
BASO#: 0.1 10*3/uL (ref 0.0–0.2)
BASO%: 0.7 % (ref 0.0–2.0)
EOS ABS: 0.5 10*3/uL (ref 0.0–0.5)
EOS%: 6.6 % (ref 0.0–7.0)
HEMATOCRIT: 39.4 % (ref 34.8–46.6)
HEMOGLOBIN: 12.6 g/dL (ref 11.6–15.9)
LYMPH#: 1.5 10*3/uL (ref 0.9–3.3)
LYMPH%: 20.9 % (ref 14.0–48.0)
MCH: 27.8 pg (ref 26.0–34.0)
MCHC: 32 g/dL (ref 32.0–36.0)
MCV: 87 fL (ref 81–101)
MONO#: 0.5 10*3/uL (ref 0.1–0.9)
MONO%: 7.1 % (ref 0.0–13.0)
NEUT#: 4.5 10*3/uL (ref 1.5–6.5)
NEUT%: 64.7 % (ref 39.6–80.0)
Platelets: 235 10*3/uL (ref 145–400)
RBC: 4.54 10*6/uL (ref 3.70–5.32)
RDW: 13.4 % (ref 11.1–15.7)
WBC: 7 10*3/uL (ref 3.9–10.0)

## 2014-07-27 LAB — RETICULOCYTES (CHCC)
ABS Retic: 88.9 10*3/uL (ref 19.0–186.0)
RBC.: 4.68 MIL/uL (ref 3.87–5.11)
RETIC CT PCT: 1.9 % (ref 0.4–2.3)

## 2014-07-27 LAB — CHCC SATELLITE - SMEAR

## 2014-07-27 NOTE — Progress Notes (Signed)
Hematology and Oncology Follow Up Visit  CHRISTIONNA POLAND [id number] 12/26/1980 34 y.o. 07/27/2014   Principle Diagnosis:  Iron deficiency anemia due to menometrorrhagia  Current Therapy:   IV iron as needed    Interim History:  Ms. Tutor is here today for a follow-up. She is feeling a bit tired. She is still having heavy cycles but has not made an appointment with a gynecologist yet due to financial issues.  She denies fever, chills, n/v, cough, rash, dizziness, SOB, chewing ice, mouth sores, chest pain, palpitations, abdominal pain, constipation, diarrhea, blood in urine or stool.  No swelling, tenderness, numbness or tingling in her extremities. No new aches or pains.  Her appetite is good and is staying hydrated. Her weight is stable.  She had one dose of Fereheme in November and responded very nicely.   Medications:    Medication List       This list is accurate as of: 07/27/14  3:26 PM.  Always use your most recent med list.               acetaminophen 325 MG tablet  Commonly known as:  TYLENOL  Take 650 mg by mouth every 6 (six) hours as needed for mild pain.     ibuprofen 200 MG tablet  Commonly known as:  ADVIL,MOTRIN  Take 600-800 mg by mouth every 6 (six) hours as needed for moderate pain.     loratadine 10 MG tablet  Commonly known as:  CLARITIN  Take 10 mg by mouth daily.     nortriptyline 10 MG capsule  Commonly known as:  PAMELOR  Take 1 capsule (10 mg total) by mouth at bedtime.     SUMAtriptan 50 MG tablet  Commonly known as:  IMITREX  Take 1 tablet (50 mg total) by mouth every 2 (two) hours as needed for migraine or headache. May repeat in 2 hours if headache persists or recurs. No more the 2 in 24 hours.        Allergies: No Known Allergies  Past Medical History, Surgical history, Social history, and Family History were reviewed and updated.  Review of Systems: All other 10 point review of systems is negative.   Physical Exam:  weight is 193  lb (87.544 kg). Her oral temperature is 97.8 F (36.6 C). Her blood pressure is 139/88 and her pulse is 71. Her respiration is 18.   Wt Readings from Last 3 Encounters:  07/27/14 193 lb (87.544 kg)  06/05/14 186 lb 4.8 oz (84.505 kg)  06/01/14 188 lb 9.6 oz (85.548 kg)    Ocular: Sclerae unicteric, pupils equal, round and reactive to light Ear-nose-throat: Oropharynx clear, dentition fair Lymphatic: No cervical or supraclavicular adenopathy Lungs no rales or rhonchi, good excursion bilaterally Heart regular rate and rhythm, no murmur appreciated Abd soft, nontender, positive bowel sounds MSK no focal spinal tenderness, no joint edema Neuro: non-focal, well-oriented, appropriate affect Breasts: Deferred  Lab Results  Component Value Date   WBC 7.0 07/27/2014   HGB 12.6 07/27/2014   HCT 39.4 07/27/2014   MCV 87 07/27/2014   PLT 235 07/27/2014   Lab Results  Component Value Date   FERRITIN 33 04/27/2014   IRON 62 04/27/2014   TIBC 360 04/27/2014   UIBC 298 04/27/2014   IRONPCTSAT 17* 04/27/2014   Lab Results  Component Value Date   RETICCTPCT 0.9 03/13/2014   RBC 4.54 07/27/2014   RETICCTABS 39.5 03/13/2014   No results found for: KPAFRELGTCHN, LAMBDASER, KAPLAMBRATIO No  results found for: IGGSERUM, IGA, IGMSERUM No results found for: Odetta Pink, SPEI   Chemistry      Component Value Date/Time   NA 136 03/13/2014 1439   K 4.0 03/13/2014 1439   CL 103 03/13/2014 1439   CO2 22 03/13/2014 1439   BUN 12 03/13/2014 1439   CREATININE 0.78 03/13/2014 1439      Component Value Date/Time   CALCIUM 9.3 03/13/2014 1439   ALKPHOS 51 03/13/2014 1439   AST 11 03/13/2014 1439   ALT <8 03/13/2014 1439   BILITOT 0.3 03/13/2014 1439     Impression and Plan: Ms. Zimny is 34 year old African-American female with iron deficiency anemia due to menometrorrhagia. She has responded very nicely to [place]. She had 1 does in  November. She is feeling tired.  Her Hgb is 12.6 and MCV 87. We will see what her iron studies show and get her back in for iron if needed.  We will see her back in 3 months for labs and follow-up.  She knows to call here with any questions or concerns. We can certainly see her sooner if need be.   Eliezer Bottom, NP 3/21/20163:26 PM

## 2014-07-28 LAB — IRON AND TIBC CHCC
%SAT: 7 % — ABNORMAL LOW (ref 21–57)
IRON: 32 ug/dL — AB (ref 41–142)
TIBC: 435 ug/dL (ref 236–444)
UIBC: 403 ug/dL — ABNORMAL HIGH (ref 120–384)

## 2014-07-28 LAB — FERRITIN CHCC: Ferritin: 9 ng/ml (ref 9–269)

## 2014-08-04 ENCOUNTER — Telehealth: Payer: Self-pay | Admitting: Oncology

## 2014-08-04 ENCOUNTER — Other Ambulatory Visit: Payer: Self-pay | Admitting: Oncology

## 2014-08-04 DIAGNOSIS — D509 Iron deficiency anemia, unspecified: Secondary | ICD-10-CM

## 2014-08-04 NOTE — Telephone Encounter (Addendum)
-----   Message from Volanda Napoleon, MD sent at 08/03/2014  4:07 PM EDT ----- Call - iron is very low!!!  Need feraheme 510mg  x 2 doses.  Please setup!!  Pete  Left voicemail message. Put orders in under signed and held. Sent [employer].

## 2014-08-05 ENCOUNTER — Telehealth: Payer: Self-pay | Admitting: Hematology & Oncology

## 2014-08-05 NOTE — Telephone Encounter (Signed)
Left pt message to call and schedule 2 appointments

## 2014-08-05 NOTE — Telephone Encounter (Signed)
Pt scheduled 4-6 and 4-14 iron inbasket to [employer]

## 2014-08-12 ENCOUNTER — Ambulatory Visit: Payer: BC Managed Care – PPO

## 2014-08-18 ENCOUNTER — Ambulatory Visit (HOSPITAL_BASED_OUTPATIENT_CLINIC_OR_DEPARTMENT_OTHER): Payer: BC Managed Care – PPO

## 2014-08-18 VITALS — BP 129/64 | HR 82 | Temp 97.4°F | Resp 18

## 2014-08-18 DIAGNOSIS — D509 Iron deficiency anemia, unspecified: Secondary | ICD-10-CM

## 2014-08-18 MED ORDER — SODIUM CHLORIDE 0.9 % IV SOLN
510.0000 mg | Freq: Once | INTRAVENOUS | Status: AC
  Administered 2014-08-18: 510 mg via INTRAVENOUS
  Filled 2014-08-18: qty 17

## 2014-08-18 MED ORDER — SODIUM CHLORIDE 0.9 % IV SOLN
Freq: Once | INTRAVENOUS | Status: AC
  Administered 2014-08-18: 14:00:00 via INTRAVENOUS

## 2014-08-18 NOTE — Patient Instructions (Signed)

## 2014-08-20 ENCOUNTER — Ambulatory Visit: Payer: BC Managed Care – PPO

## 2014-08-27 ENCOUNTER — Ambulatory Visit (HOSPITAL_BASED_OUTPATIENT_CLINIC_OR_DEPARTMENT_OTHER): Payer: BC Managed Care – PPO

## 2014-08-27 VITALS — BP 133/80 | HR 92 | Temp 98.2°F | Resp 18

## 2014-08-27 DIAGNOSIS — D500 Iron deficiency anemia secondary to blood loss (chronic): Secondary | ICD-10-CM

## 2014-08-27 DIAGNOSIS — D509 Iron deficiency anemia, unspecified: Secondary | ICD-10-CM

## 2014-08-27 MED ORDER — SODIUM CHLORIDE 0.9 % IV SOLN
510.0000 mg | Freq: Once | INTRAVENOUS | Status: AC
  Administered 2014-08-27: 510 mg via INTRAVENOUS
  Filled 2014-08-27: qty 17

## 2014-08-27 NOTE — Patient Instructions (Signed)

## 2014-09-07 ENCOUNTER — Ambulatory Visit: Payer: BC Managed Care – PPO | Admitting: Neurology

## 2014-10-26 ENCOUNTER — Encounter: Payer: Self-pay | Admitting: Cardiology

## 2014-10-26 ENCOUNTER — Other Ambulatory Visit: Payer: BC Managed Care – PPO

## 2014-10-26 ENCOUNTER — Ambulatory Visit: Payer: BC Managed Care – PPO | Admitting: Hematology & Oncology

## 2014-10-30 ENCOUNTER — Encounter: Payer: Self-pay | Admitting: Family

## 2014-10-30 ENCOUNTER — Ambulatory Visit (HOSPITAL_BASED_OUTPATIENT_CLINIC_OR_DEPARTMENT_OTHER): Payer: BC Managed Care – PPO | Admitting: Family

## 2014-10-30 ENCOUNTER — Other Ambulatory Visit (HOSPITAL_BASED_OUTPATIENT_CLINIC_OR_DEPARTMENT_OTHER): Payer: BC Managed Care – PPO

## 2014-10-30 VITALS — BP 129/64 | HR 74 | Temp 98.2°F | Resp 16 | Ht 66.0 in | Wt 192.0 lb

## 2014-10-30 DIAGNOSIS — D509 Iron deficiency anemia, unspecified: Secondary | ICD-10-CM

## 2014-10-30 DIAGNOSIS — N922 Excessive menstruation at puberty: Secondary | ICD-10-CM

## 2014-10-30 LAB — IRON AND TIBC
%SAT: 18 % — AB (ref 20–55)
IRON: 72 ug/dL (ref 42–145)
TIBC: 397 ug/dL (ref 250–470)
UIBC: 325 ug/dL (ref 125–400)

## 2014-10-30 LAB — CBC WITH DIFFERENTIAL (CANCER CENTER ONLY)
BASO#: 0 10*3/uL (ref 0.0–0.2)
BASO%: 0.3 % (ref 0.0–2.0)
EOS%: 3.4 % (ref 0.0–7.0)
Eosinophils Absolute: 0.3 10*3/uL (ref 0.0–0.5)
HEMATOCRIT: 42.6 % (ref 34.8–46.6)
HEMOGLOBIN: 14.4 g/dL (ref 11.6–15.9)
LYMPH#: 1.1 10*3/uL (ref 0.9–3.3)
LYMPH%: 11.1 % — ABNORMAL LOW (ref 14.0–48.0)
MCH: 30 pg (ref 26.0–34.0)
MCHC: 33.8 g/dL (ref 32.0–36.0)
MCV: 89 fL (ref 81–101)
MONO#: 0.7 10*3/uL (ref 0.1–0.9)
MONO%: 7.1 % (ref 0.0–13.0)
NEUT%: 78.1 % (ref 39.6–80.0)
NEUTROS ABS: 7.4 10*3/uL — AB (ref 1.5–6.5)
PLATELETS: 178 10*3/uL (ref 145–400)
RBC: 4.8 10*6/uL (ref 3.70–5.32)
RDW: 14.7 % (ref 11.1–15.7)
WBC: 9.4 10*3/uL (ref 3.9–10.0)

## 2014-10-30 LAB — RETICULOCYTES (CHCC)
ABS Retic: 73.7 10*3/uL (ref 19.0–186.0)
RBC.: 4.91 MIL/uL (ref 3.87–5.11)
Retic Ct Pct: 1.5 % (ref 0.4–2.3)

## 2014-10-30 LAB — CHCC SATELLITE - SMEAR

## 2014-10-30 LAB — FERRITIN: FERRITIN: 64 ng/mL (ref 10–291)

## 2014-10-30 NOTE — Progress Notes (Signed)
Hematology and Oncology Follow Up Visit  Tamara Ball [id number] January 27, 1981 34 y.o. 10/30/2014   Principle Diagnosis:  Iron deficiency anemia due to menometrorrhagia  Current Therapy:   IV iron as needed    Interim History:  Tamara Ball is here today for a follow-up. She is feeling. She found out she was pregnant a couple months ago. She is no longer pregnant now and is on a new birth control. She has not had a heavy cycle since starting this. Her Hgb is up to 14.4 with MCV 89.  She works a lot and has to get early. She is fatigued at times from this.  She denies fever, chills, n/v, cough, rash, dizziness, SOB, chest pain, palpitations, abdominal pain, constipation, diarrhea, blood in urine or stool.  No swelling, tenderness, numbness or tingling in her extremities. No new aches or pains.  She is eating healthy and staying hydrated. Her weight is stable.   Medications:    Medication List       This list is accurate as of: 10/30/14 12:41 PM.  Always use your most recent med list.               acetaminophen 325 MG tablet  Commonly known as:  TYLENOL  Take 650 mg by mouth every 6 (six) hours as needed for mild pain.     amoxicillin 500 MG capsule  Commonly known as:  AMOXIL     fluconazole 150 MG tablet  Commonly known as:  DIFLUCAN     HYDROcodone-acetaminophen 7.5-325 MG per tablet  Commonly known as:  NORCO     ibuprofen 200 MG tablet  Commonly known as:  ADVIL,MOTRIN  Take 600-800 mg by mouth every 6 (six) hours as needed for moderate pain.     loratadine 10 MG tablet  Commonly known as:  CLARITIN  Take 10 mg by mouth daily.     nortriptyline 10 MG capsule  Commonly known as:  PAMELOR  Take 1 capsule (10 mg total) by mouth at bedtime.     SPRINTEC 28 0.25-35 MG-MCG tablet  Generic drug:  norgestimate-ethinyl estradiol     SUMAtriptan 50 MG tablet  Commonly known as:  IMITREX  Take 1 tablet (50 mg total) by mouth every 2 (two) hours as needed for migraine or  headache. May repeat in 2 hours if headache persists or recurs. No more the 2 in 24 hours.        Allergies: No Known Allergies  Past Medical History, Surgical history, Social history, and Family History were reviewed and updated.  Review of Systems: All other 10 point review of systems is negative.   Physical Exam:  height is 5\' 6"  (1.676 m) and weight is 192 lb (87.091 kg). Her oral temperature is 98.2 F (36.8 C). Her blood pressure is 129/64 and her pulse is 74. Her respiration is 16.   Wt Readings from Last 3 Encounters:  10/30/14 192 lb (87.091 kg)  07/27/14 193 lb (87.544 kg)  06/05/14 186 lb 4.8 oz (84.505 kg)    Ocular: Sclerae unicteric, pupils equal, round and reactive to light Ear-nose-throat: Oropharynx clear, dentition fair Lymphatic: No cervical or supraclavicular adenopathy Lungs no rales or rhonchi, good excursion bilaterally Heart regular rate and rhythm, no murmur appreciated Abd soft, nontender, positive bowel sounds MSK no focal spinal tenderness, no joint edema Neuro: non-focal, well-oriented, appropriate affect Breasts: Deferred  Lab Results  Component Value Date   WBC 9.4 10/30/2014   HGB 14.4 10/30/2014  HCT 42.6 10/30/2014   MCV 89 10/30/2014   PLT 178 10/30/2014   Lab Results  Component Value Date   FERRITIN 9 07/27/2014   IRON 32* 07/27/2014   TIBC 435 07/27/2014   UIBC 403* 07/27/2014   IRONPCTSAT 7* 07/27/2014   Lab Results  Component Value Date   RETICCTPCT 1.9 07/27/2014   RBC 4.80 10/30/2014   RETICCTABS 88.9 07/27/2014   No results found for: KPAFRELGTCHN, LAMBDASER, KAPLAMBRATIO No results found for: IGGSERUM, IGA, IGMSERUM No results found for: Odetta Pink, SPEI   Chemistry      Component Value Date/Time   NA 136 03/13/2014 1439   K 4.0 03/13/2014 1439   CL 103 03/13/2014 1439   CO2 22 03/13/2014 1439   BUN 12 03/13/2014 1439   CREATININE 0.78 03/13/2014 1439       Component Value Date/Time   CALCIUM 9.3 03/13/2014 1439   ALKPHOS 51 03/13/2014 1439   AST 11 03/13/2014 1439   ALT <8 03/13/2014 1439   BILITOT 0.3 03/13/2014 1439     Impression and Plan: Tamara Ball is 34 year old African-American female with iron deficiency anemia due to menometrorrhagia. Since starting a new birth control she has not had a heavy cycle. She has an appointment with a gynecologist in July. She is asymptomatic at this time.  Her Hgb and MCV today are much better. We will see what her iron studies show.  We will see her back in 3 months for labs and follow-up.  She knows to call here with any questions or concerns. We can certainly see her sooner if need be.   Eliezer Bottom, NP 6/24/201612:41 PM

## 2015-02-03 ENCOUNTER — Other Ambulatory Visit: Payer: BC Managed Care – PPO

## 2015-02-03 ENCOUNTER — Ambulatory Visit: Payer: BC Managed Care – PPO | Admitting: Hematology & Oncology

## 2015-02-25 ENCOUNTER — Ambulatory Visit (HOSPITAL_BASED_OUTPATIENT_CLINIC_OR_DEPARTMENT_OTHER): Payer: BC Managed Care – PPO | Admitting: Hematology & Oncology

## 2015-02-25 ENCOUNTER — Other Ambulatory Visit (HOSPITAL_BASED_OUTPATIENT_CLINIC_OR_DEPARTMENT_OTHER): Payer: BC Managed Care – PPO

## 2015-02-25 ENCOUNTER — Encounter: Payer: Self-pay | Admitting: Hematology & Oncology

## 2015-02-25 VITALS — BP 133/67 | HR 78 | Temp 97.5°F | Resp 16 | Ht 66.0 in | Wt 190.0 lb

## 2015-02-25 DIAGNOSIS — N921 Excessive and frequent menstruation with irregular cycle: Secondary | ICD-10-CM | POA: Diagnosis not present

## 2015-02-25 DIAGNOSIS — D500 Iron deficiency anemia secondary to blood loss (chronic): Secondary | ICD-10-CM | POA: Diagnosis not present

## 2015-02-25 DIAGNOSIS — N920 Excessive and frequent menstruation with regular cycle: Secondary | ICD-10-CM

## 2015-02-25 DIAGNOSIS — D509 Iron deficiency anemia, unspecified: Secondary | ICD-10-CM

## 2015-02-25 LAB — CBC WITH DIFFERENTIAL (CANCER CENTER ONLY)
BASO#: 0 10*3/uL (ref 0.0–0.2)
BASO%: 0.4 % (ref 0.0–2.0)
EOS ABS: 0.5 10*3/uL (ref 0.0–0.5)
EOS%: 6.7 % (ref 0.0–7.0)
HEMATOCRIT: 39.6 % (ref 34.8–46.6)
HGB: 12.8 g/dL (ref 11.6–15.9)
LYMPH#: 1.4 10*3/uL (ref 0.9–3.3)
LYMPH%: 18.4 % (ref 14.0–48.0)
MCH: 29.7 pg (ref 26.0–34.0)
MCHC: 32.3 g/dL (ref 32.0–36.0)
MCV: 92 fL (ref 81–101)
MONO#: 0.5 10*3/uL (ref 0.1–0.9)
MONO%: 6.7 % (ref 0.0–13.0)
NEUT#: 5.1 10*3/uL (ref 1.5–6.5)
NEUT%: 67.8 % (ref 39.6–80.0)
Platelets: 197 10*3/uL (ref 145–400)
RBC: 4.31 10*6/uL (ref 3.70–5.32)
RDW: 13 % (ref 11.1–15.7)
WBC: 7.6 10*3/uL (ref 3.9–10.0)

## 2015-02-25 LAB — CHCC SATELLITE - SMEAR

## 2015-02-25 LAB — RETICULOCYTES (CHCC)
ABS RETIC: 52.1 10*3/uL (ref 19.0–186.0)
RBC.: 4.34 MIL/uL (ref 3.87–5.11)
Retic Ct Pct: 1.2 % (ref 0.4–2.3)

## 2015-02-25 NOTE — Progress Notes (Signed)
Hematology and Oncology Follow Up Visit  Tamara Ball [id number] 1981-02-12 34 y.o. 02/25/2015   Principle Diagnosis:  Iron deficiency anemia due to menometrorrhagia  Current Therapy:   IV iron as needed - last dose given in April 2016.    Interim History:  Tamara Ball is here today for a follow-up. She is still having heavy cycles. She does have some fibroids. She is on a new type of oral contraceptive to try to help with her cycles.  She last got IV iron back in April.  Otherwise, she seems to be doing okay. Her last iron studies back in June showed a ferritin of 64 with an iron saturation of 18%.   She is working. She is pretty busy at work.  She's had no problems with rashes. There's been no leg swelling. She's had no fever. She's had no change in bowel or bladder habits.  Overall, her performance status is ECOG 0.    Medications:    Medication List       This list is accurate as of: 02/25/15  4:46 PM.  Always use your most recent med list.               acetaminophen 325 MG tablet  Commonly known as:  TYLENOL  Take 650 mg by mouth every 6 (six) hours as needed for mild pain.     loratadine 10 MG tablet  Commonly known as:  CLARITIN  Take 10 mg by mouth daily.     nortriptyline 10 MG capsule  Commonly known as:  PAMELOR  Take 1 capsule (10 mg total) by mouth at bedtime.     SUMAtriptan 50 MG tablet  Commonly known as:  IMITREX  Take 1 tablet (50 mg total) by mouth every 2 (two) hours as needed for migraine or headache. May repeat in 2 hours if headache persists or recurs. No more the 2 in 24 hours.        Allergies: No Known Allergies  Past Medical History, Surgical history, Social history, and Family History were reviewed and updated.  Review of Systems: All other 10 point review of systems is negative.   Physical Exam:  height is 5\' 6"  (1.676 m) and weight is 190 lb (86.183 kg). Her oral temperature is 97.5 F (36.4 C). Her blood pressure is  133/67 and her pulse is 78. Her respiration is 16.   Wt Readings from Last 3 Encounters:  02/25/15 190 lb (86.183 kg)  10/30/14 192 lb (87.091 kg)  07/27/14 193 lb (87.544 kg)    Head and neck exam shows no ocular or oral lesions. She has no scleral icterus. She has no adenopathy in the neck. Lungs are clear bilaterally. Cardiac exam regular rate and rhythm with no murmurs, rubs or bruits. Abdomen is soft. She has good bowel sounds. There is no fluid wave. There is no palpable liver or spleen tip. Back exam shows no tenderness over the spine, ribs or hips. Extremities shows no clubbing, cyanosis or edema. Neurological exam shows no focal neurological deficits. Skin exam shows no rashes, ecchymoses or petechia.   Lab Results  Component Value Date   WBC 7.6 02/25/2015   HGB 12.8 02/25/2015   HCT 39.6 02/25/2015   MCV 92 02/25/2015   PLT 197 02/25/2015   Lab Results  Component Value Date   FERRITIN 64 10/30/2014   IRON 72 10/30/2014   TIBC 397 10/30/2014   UIBC 325 10/30/2014   IRONPCTSAT 18* 10/30/2014   Lab  Results  Component Value Date   RETICCTPCT 1.5 10/30/2014   RBC 4.31 02/25/2015   RETICCTABS 73.7 10/30/2014   No results found for: KPAFRELGTCHN, LAMBDASER, KAPLAMBRATIO No results found for: IGGSERUM, IGA, IGMSERUM No results found for: Odetta Pink, SPEI   Chemistry      Component Value Date/Time   NA 136 03/13/2014 1439   K 4.0 03/13/2014 1439   CL 103 03/13/2014 1439   CO2 22 03/13/2014 1439   BUN 12 03/13/2014 1439   CREATININE 0.78 03/13/2014 1439      Component Value Date/Time   CALCIUM 9.3 03/13/2014 1439   ALKPHOS 51 03/13/2014 1439   AST 11 03/13/2014 1439   ALT <8 03/13/2014 1439   BILITOT 0.3 03/13/2014 1439     Impression and Plan: Tamara Ball is a 34 year old African-American female. She is iron deficiency anemia secondary to menometrorrhagia.   We will see what her iron studies show this time.  Her MCV is a little bit higher so hopefully her iron will be okay.   I think we'll probably get her back in about 4 months. I think this would be reasonable.   Volanda Napoleon, MD 10/20/20164:46 PM

## 2015-02-26 ENCOUNTER — Other Ambulatory Visit: Payer: Self-pay | Admitting: Family

## 2015-02-26 LAB — IRON AND TIBC CHCC
%SAT: 15 % — AB (ref 21–57)
Iron: 51 ug/dL (ref 41–142)
TIBC: 349 ug/dL (ref 236–444)
UIBC: 298 ug/dL (ref 120–384)

## 2015-02-26 LAB — FERRITIN CHCC: Ferritin: 30 ng/ml (ref 9–269)

## 2015-03-04 ENCOUNTER — Other Ambulatory Visit: Payer: BC Managed Care – PPO

## 2015-03-04 ENCOUNTER — Ambulatory Visit: Payer: BC Managed Care – PPO

## 2015-03-11 ENCOUNTER — Other Ambulatory Visit: Payer: Self-pay | Admitting: Emergency Medicine

## 2015-03-11 ENCOUNTER — Other Ambulatory Visit: Payer: BC Managed Care – PPO

## 2015-03-11 ENCOUNTER — Ambulatory Visit (HOSPITAL_BASED_OUTPATIENT_CLINIC_OR_DEPARTMENT_OTHER): Payer: BC Managed Care – PPO

## 2015-03-11 VITALS — BP 129/73 | HR 76 | Temp 97.8°F | Resp 18

## 2015-03-11 DIAGNOSIS — D500 Iron deficiency anemia secondary to blood loss (chronic): Secondary | ICD-10-CM | POA: Diagnosis not present

## 2015-03-11 DIAGNOSIS — D509 Iron deficiency anemia, unspecified: Secondary | ICD-10-CM

## 2015-03-11 DIAGNOSIS — N921 Excessive and frequent menstruation with irregular cycle: Secondary | ICD-10-CM | POA: Diagnosis not present

## 2015-03-11 DIAGNOSIS — N920 Excessive and frequent menstruation with regular cycle: Secondary | ICD-10-CM

## 2015-03-11 MED ORDER — SODIUM CHLORIDE 0.9 % IV SOLN
510.0000 mg | Freq: Once | INTRAVENOUS | Status: AC
  Administered 2015-03-11: 510 mg via INTRAVENOUS
  Filled 2015-03-11: qty 17

## 2015-03-11 MED ORDER — SODIUM CHLORIDE 0.9 % IV SOLN: 510.0000 mg | Freq: Once | INTRAVENOUS | Status: DC

## 2015-03-11 NOTE — Patient Instructions (Signed)

## 2015-04-07 ENCOUNTER — Encounter: Payer: Self-pay | Admitting: Family Medicine

## 2015-04-07 ENCOUNTER — Ambulatory Visit (INDEPENDENT_AMBULATORY_CARE_PROVIDER_SITE_OTHER): Payer: BC Managed Care – PPO | Admitting: Family Medicine

## 2015-04-07 VITALS — BP 118/78 | HR 79 | Temp 97.6°F | Ht 65.75 in | Wt 188.1 lb

## 2015-04-07 DIAGNOSIS — D500 Iron deficiency anemia secondary to blood loss (chronic): Secondary | ICD-10-CM | POA: Diagnosis not present

## 2015-04-07 DIAGNOSIS — Z0000 Encounter for general adult medical examination without abnormal findings: Secondary | ICD-10-CM | POA: Diagnosis not present

## 2015-04-07 LAB — CBC WITH DIFFERENTIAL/PLATELET
BASOS ABS: 0 10*3/uL (ref 0.0–0.1)
Basophils Relative: 0.5 % (ref 0.0–3.0)
EOS PCT: 6.6 % — AB (ref 0.0–5.0)
Eosinophils Absolute: 0.5 10*3/uL (ref 0.0–0.7)
HCT: 44.3 % (ref 36.0–46.0)
HEMOGLOBIN: 14.6 g/dL (ref 12.0–15.0)
Lymphocytes Relative: 19.5 % (ref 12.0–46.0)
Lymphs Abs: 1.4 10*3/uL (ref 0.7–4.0)
MCHC: 33 g/dL (ref 30.0–36.0)
MCV: 89.7 fl (ref 78.0–100.0)
MONO ABS: 0.4 10*3/uL (ref 0.1–1.0)
Monocytes Relative: 5.8 % (ref 3.0–12.0)
Neutro Abs: 5 10*3/uL (ref 1.4–7.7)
Neutrophils Relative %: 67.6 % (ref 43.0–77.0)
Platelets: 179 10*3/uL (ref 150.0–400.0)
RBC: 4.93 Mil/uL (ref 3.87–5.11)
RDW: 14.5 % (ref 11.5–15.5)
WBC: 7.3 10*3/uL (ref 4.0–10.5)

## 2015-04-07 LAB — LIPID PANEL
CHOLESTEROL: 123 mg/dL (ref 0–200)
HDL: 51.3 mg/dL (ref >39.00)
LDL CALC: 62 mg/dL (ref 0–99)
NonHDL: 71.32
Total CHOL/HDL Ratio: 2
Triglycerides: 45 mg/dL (ref 0.0–149.0)
VLDL: 9 mg/dL (ref 0.0–40.0)

## 2015-04-07 LAB — HEMOGLOBIN A1C: HEMOGLOBIN A1C: 5 % (ref 4.6–6.5)

## 2015-04-07 NOTE — Progress Notes (Signed)
Pre visit review using our clinic review tool, if applicable. No additional management support is needed unless otherwise documented below in the visit note. 

## 2015-04-07 NOTE — Patient Instructions (Signed)
BEFORE YOU LEAVE: -labs -follow up yearly and as needed  -We have ordered labs or studies at this visit. It can take up to 1-2 weeks for results and processing. We will contact you with instructions IF your results are abnormal. Normal results will be released to your [facility]. If you have not heard from [name] or can not find your results in [facility] in 2 weeks please contact our office.  We recommend the following healthy lifestyle measures: - eat a healthy whole foods diet consisting of regular small meals composed of vegetables, fruits, beans, nuts, seeds, healthy meats such as white chicken and fish and whole grains.  - avoid sweets, white starchy foods, fried foods, fast food, processed foods, sodas, red meet and other fattening foods.  - get a least 150-300 minutes of aerobic exercise per week.   Follow up with your gynecologist for your pap smear and breast exam - your pap smear is due in February  Vit D3 [id number] IU daily  Folic acid 0000000 daily

## 2015-04-07 NOTE — Progress Notes (Signed)
HPI:  Here for CPE:  -Still seeing hematology for iron infusions for iron def anemia 2ndary to menorrhagia - seeing Dr. Garwin Brothers for this.  -Diet: variety of foods, balance and well rounded  -Exercise: regular exercise  -Taking folic acid, vitamin D or calcium: no  -Diabetes and Dyslipidemia Screening: today  -Hx of HTN: no  -Vaccines: UTD  -pap history: does with gyn - Dr. Garwin Brothers  -sexual activity: not currently   -wants STI testing (Hep C if born [birth year]-65): wants HIV testing, declines other testing  -plans to see Dr. Garwin Brothers for gyn/breast exam  -Alcohol, Tobacco, drug use: see social history  Review of Systems - no fevers, unintentional weight loss, vision loss, hearing loss, chest pain, sob, hemoptysis, melena, hematochezia, hematuria, genital discharge, changing or concerning skin lesions, bleeding, bruising, loc, thoughts of self harm or SI  Past Medical History  Diagnosis Date  . Chicken pox   . Chronic headaches   . Menorrhagia   . Iron deficiency anemia due to chronic blood loss     from heavy periods  . Fibroids     No past surgical history on file.  Family History  Problem Relation Age of Onset  . Hypertension Mother   . Hypertension Maternal Uncle   . Diabetes Maternal Grandfather     Social History   Social History  . Marital Status: Married    Spouse Name: N/A  . Number of Children: N/A  . Years of Education: N/A   Social History Main Topics  . Smoking status: Never Smoker   . Smokeless tobacco: Never Used     Comment: never used tobacco  . Alcohol Use: 0.0 oz/week    0 Standard drinks or equivalent per week     Comment: social  . Drug Use: No  . Sexual Activity:    Partners: Male    Patent examiner Protection: None   Other Topics Concern  . None   Social History Narrative   Work or School: higher education - Engineer, site Situation: lives with 36 yo      Spiritual Beliefs: Christian      Lifestyle: no  regular exercise; diet is so so           Current outpatient prescriptions:  .  acetaminophen (TYLENOL) 325 MG tablet, Take 650 mg by mouth every 6 (six) hours as needed for mild pain. , Disp: , Rfl:  .  loratadine (CLARITIN) 10 MG tablet, Take 10 mg by mouth daily. , Disp: , Rfl:  .  SUMAtriptan (IMITREX) 50 MG tablet, Take 1 tablet (50 mg total) by mouth every 2 (two) hours as needed for migraine or headache. May repeat in 2 hours if headache persists or recurs. No more the 2 in 24 hours., Disp: 10 tablet, Rfl: 0  EXAM:  Filed Vitals:   04/07/15 1123  BP: 118/78  Pulse: 79  Temp: 97.6 F (36.4 C)    GENERAL: vitals reviewed and listed below, alert, oriented, appears well hydrated and in no acute distress  HEENT: head atraumatic, PERRLA, normal appearance of eyes, ears, nose and mouth. moist mucus membranes.  NECK: supple, no masses or lymphadenopathy  LUNGS: clear to auscultation bilaterally, no rales, rhonchi or wheeze  CV: HRRR, no peripheral edema or cyanosis, normal pedal pulses  BREAST: declined  ABDOMEN: bowel sounds normal, soft, non tender to palpation, no masses, no rebound or guarding  GU: declined  SKIN: no rash or abnormal lesions  MS: normal gait, moves all extremities normally  NEURO: CN II-XII grossly intact, normal muscle strength and sensation to light touch on extremities  PSYCH: normal affect, pleasant and cooperative  ASSESSMENT AND PLAN:  Discussed the following assessment and plan:  Visit for preventive health examination - Plan: Lipid Panel, Hemoglobin A1c, HIV antibody (with reflex)  Iron deficiency anemia due to chronic blood loss - Plan: CBC with Differential   -Discussed and advised all [name] preventive services health task force level A and B recommendations for age, sex and risks.  -Advised at least 150 minutes of exercise per week and a healthy diet low in saturated fats and sweets and consisting of fresh fruits and vegetables, lean  meats such as fish and white chicken and whole grains.  -labs, studies and vaccines per orders this encounter  Orders Placed This Encounter  Procedures  . Lipid Panel  . Hemoglobin A1c  . CBC with Differential  . HIV antibody (with reflex)    Patient advised to return to clinic immediately if symptoms worsen or persist or new concerns.  Patient Instructions  BEFORE YOU LEAVE: -labs -follow up yearly and as needed  -We have ordered labs or studies at this visit. It can take up to 1-2 weeks for results and processing. We will contact you with instructions IF your results are abnormal. Normal results will be released to your [facility]. If you have not heard from [name] or can not find your results in [facility] in 2 weeks please contact our office.  We recommend the following healthy lifestyle measures: - eat a healthy whole foods diet consisting of regular small meals composed of vegetables, fruits, beans, nuts, seeds, healthy meats such as white chicken and fish and whole grains.  - avoid sweets, white starchy foods, fried foods, fast food, processed foods, sodas, red meet and other fattening foods.  - get a least 150-300 minutes of aerobic exercise per week.   Follow up with your gynecologist for your pap smear and breast exam - your pap smear is due in February  Vit D3 [id number] IU daily  Folic acid 0000000 daily         No Follow-up on file.  Colin Benton R.

## 2015-04-08 LAB — HIV ANTIBODY (ROUTINE TESTING W REFLEX): HIV 1&2 Ab, 4th Generation: NONREACTIVE

## 2015-04-12 ENCOUNTER — Ambulatory Visit (INDEPENDENT_AMBULATORY_CARE_PROVIDER_SITE_OTHER): Payer: BC Managed Care – PPO | Admitting: Family Medicine

## 2015-04-12 ENCOUNTER — Encounter: Payer: Self-pay | Admitting: Family Medicine

## 2015-04-12 VITALS — BP 130/60 | HR 71 | Temp 98.7°F | Ht 65.75 in | Wt 194.5 lb

## 2015-04-12 DIAGNOSIS — J3089 Other allergic rhinitis: Secondary | ICD-10-CM | POA: Diagnosis not present

## 2015-04-12 DIAGNOSIS — J069 Acute upper respiratory infection, unspecified: Secondary | ICD-10-CM | POA: Diagnosis not present

## 2015-04-12 MED ORDER — AMOXICILLIN-POT CLAVULANATE 875-125 MG PO TABS: 1.0000 | ORAL_TABLET | Freq: Two times a day (BID) | ORAL | Status: DC

## 2015-04-12 NOTE — Patient Instructions (Signed)

## 2015-04-12 NOTE — Progress Notes (Signed)
Pre visit review using our clinic review tool, if applicable. No additional management support is needed unless otherwise documented below in the visit note. 

## 2015-04-12 NOTE — Progress Notes (Signed)
HPI:  URI: -started: basline allergy issues with PND and sneezing for several months, but got much worse about 3-4 days ago -symptoms:nasal congestion, sore throat, cough -denies:fever, SOB, NVD, tooth pain, sinus pain -has tried: flonase, claritin, OTC cold medications -sick contacts/travel/risks: denies flu exposure, tick exposure or or Ebola risks -Hx of: allergies - takes flonase and claritin  ROS: See pertinent positives and negatives per HPI.  Past Medical History  Diagnosis Date  . Chicken pox   . Chronic headaches   . Menorrhagia   . Iron deficiency anemia due to chronic blood loss     from heavy periods  . Fibroids     No past surgical history on file.  Family History  Problem Relation Age of Onset  . Hypertension Mother   . Hypertension Maternal Uncle   . Diabetes Maternal Grandfather     Social History   Social History  . Marital Status: Married    Spouse Name: N/A  . Number of Children: N/A  . Years of Education: N/A   Social History Main Topics  . Smoking status: Never Smoker   . Smokeless tobacco: Never Used     Comment: never used tobacco  . Alcohol Use: 0.0 oz/week    0 Standard drinks or equivalent per week     Comment: social  . Drug Use: No  . Sexual Activity:    Partners: Male    Patent examiner Protection: None   Other Topics Concern  . 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  acetaminophen (TYLENOL) 325 MG tablet, Take 650 mg by mouth every 6 (six) hours as needed for mild pain. , Disp: , Rfl:  .  loratadine (CLARITIN) 10 MG tablet, Take 10 mg by mouth daily. , Disp: , Rfl:  .  SUMAtriptan (IMITREX) 50 MG tablet, Take 1 tablet (50 mg total) by mouth every 2 (two) hours as needed for migraine or headache.  May repeat in 2 hours if headache persists or recurs. No more the 2 in 24 hours., Disp: 10 tablet, Rfl: 0 .  amoxicillin-clavulanate (AUGMENTIN) 875-125 MG tablet, Take 1 tablet by mouth 2 (two) times daily., Disp: 14 tablet, Rfl: 0  EXAM:  Filed Vitals:   04/12/15 1259  BP: 130/60  Pulse: 71  Temp: 98.7 F (37.1 C)    Body mass index is 31.63 kg/(m^2).  GENERAL: vitals reviewed and listed above, alert, oriented, appears well hydrated and in no acute distress  HEENT: atraumatic, conjunttiva clear, no obvious abnormalities on inspection of external nose and ears, normal appearance of ear canals and TMs, clear nasal congestion, mild post oropharyngeal erythema with PND, no tonsillar edema or exudate, no sinus TTP  NECK: no obvious masses on inspection  LUNGS: clear to auscultation bilaterally, no wheezes, rales or rhonchi, good air movement  CV: HRRR, no peripheral edema  MS: moves all extremities without noticeable abnormality  PSYCH: pleasant and cooperative, no obvious depression or anxiety  ASSESSMENT AND PLAN:  Discussed the following assessment and plan:  Upper respiratory infection  Other allergic rhinitis  -given HPI and exam findings today, a serious infection or illness is unlikely. We discussed potential etiologies,  with VURI being most likely, and advised supportive care and monitoring. We discussed treatment side effects, likely course, antibiotic misuse, transmission, and signs of developing a serious illness. -delayed abx if not improving as expected,sinus pain or sign/symptoms of developing bacterial sinusitis discussed -allergy eval if persistent advised -of course, we advised to return or notify a doctor immediately if symptoms worsen or persist or new concerns arise.    Patient Instructions  INSTRUCTIONS FOR UPPER RESPIRATORY INFECTION:  -plenty of rest and fluids  -nasal saline wash 2-3 times daily (use prepackaged nasal saline or bottled/distilled  water if making your own)   -can use AFRIN nasal spray for drainage and nasal congestion - but do NOT use longer then 3-4 days  -can use tylenol (in no history of liver disease) or ibuprofen (if no history of kidney disease, bowel bleeding or significant heart disease) as directed for aches and sorethroat  -in the winter time, using a humidifier at night is helpful (please follow cleaning instructions)  -if you are taking a cough medication - use only as directed, may also try a teaspoon of honey to coat the throat and throat lozenges. If given a cough medication with codeine or hydrocodone or other narcotic please be advised that this contains a strong and  potentially addicting medication. Please follow instructions carefully, take as little as possible and only use AS NEEDED for severe cough. Discuss potential side effects with your pharmacy. Please do not drive or operate machinery while taking these types of medications. Please do not take other sedating medications, drugs or alcohol while taking this medication without discussing with your doctor.  -for sore throat, salt water gargles can help  -follow up if you have fevers, facial pain, tooth pain, difficulty breathing or are worsening or symptoms persist longer then expected  Upper Respiratory Infection, Adult An upper respiratory infection (URI) is also known as the common cold. It is often caused by a type of germ (virus). Colds are easily spread (contagious). You can pass it to others by kissing, coughing, sneezing, or drinking out of the same glass. Usually, you get better in 1 to 3  weeks.  However, the cough can last for even longer. HOME CARE   Only take medicine as told by your doctor. Follow instructions provided above.  Drink enough water and fluids to keep your pee (urine) clear or pale yellow.  Get plenty of rest.  Return to work when your temperature is < 100 for 24 hours or as told by your doctor. You may use a face mask  and wash your hands to stop your cold from spreading. GET HELP RIGHT AWAY IF:   After the first few days, you feel you are getting worse.  You have questions about your medicine.  You have chills, shortness of breath, or red spit (mucus).  You have pain in the face for more then 1-2 days, especially when you bend forward.  You have a fever, puffy (swollen) neck, pain when you swallow, or white spots in the back of your throat.  You have a bad headache, ear pain, sinus pain, or chest pain.  You have a high-pitched whistling sound when you breathe in and out (wheezing).  You cough up blood.  You have sore muscles or a stiff neck. MAKE SURE YOU:   Understand these instructions.  Will watch your condition.  Will get help right away if you are not doing well or get worse. Document Released: 10/11/2007 Document Revised: 07/17/2011 Document  Reviewed: 07/30/2013 ExitCare Patient Information 2015 [place], [place]. This information is not intended to replace advice given to you by your health care provider. Make sure you discuss any questions you have with your health care provider.      Colin Benton R.

## 2015-05-27 ENCOUNTER — Other Ambulatory Visit: Payer: Self-pay | Admitting: *Deleted

## 2015-05-27 DIAGNOSIS — D500 Iron deficiency anemia secondary to blood loss (chronic): Secondary | ICD-10-CM

## 2015-05-28 ENCOUNTER — Ambulatory Visit: Payer: BC Managed Care – PPO | Admitting: Hematology & Oncology

## 2015-05-28 ENCOUNTER — Other Ambulatory Visit: Payer: BC Managed Care – PPO

## 2015-06-21 ENCOUNTER — Ambulatory Visit
Admission: RE | Admit: 2015-06-21 | Discharge: 2015-06-21 | Disposition: A | Discharge status: Home / Self Care | Payer: BC Managed Care – PPO | Source: Ambulatory Visit | Attending: Otolaryngology | Admitting: Otolaryngology

## 2015-06-21 ENCOUNTER — Other Ambulatory Visit: Payer: Self-pay | Admitting: Otolaryngology

## 2015-06-21 DIAGNOSIS — R067 Sneezing: Secondary | ICD-10-CM

## 2015-06-21 DIAGNOSIS — J328 Other chronic sinusitis: Secondary | ICD-10-CM

## 2015-06-21 DIAGNOSIS — J342 Deviated nasal septum: Secondary | ICD-10-CM

## 2015-06-21 DIAGNOSIS — J309 Allergic rhinitis, unspecified: Secondary | ICD-10-CM

## 2015-06-21 DIAGNOSIS — J3489 Other specified disorders of nose and nasal sinuses: Secondary | ICD-10-CM

## 2015-09-17 ENCOUNTER — Ambulatory Visit (INDEPENDENT_AMBULATORY_CARE_PROVIDER_SITE_OTHER): Payer: BC Managed Care – PPO | Admitting: Family Medicine

## 2015-09-17 ENCOUNTER — Encounter: Payer: Self-pay | Admitting: Family Medicine

## 2015-09-17 VITALS — BP 118/86 | HR 68 | Temp 97.7°F | Ht 65.75 in | Wt 200.5 lb

## 2015-09-17 DIAGNOSIS — J3089 Other allergic rhinitis: Secondary | ICD-10-CM | POA: Diagnosis not present

## 2015-09-17 MED ORDER — FLUTICASONE PROPIONATE 50 MCG/ACT NA SUSP: 2.0000 | Freq: Every day | NASAL | Status: DC

## 2015-09-17 MED ORDER — CETIRIZINE HCL 10 MG PO TABS: 10.0000 mg | ORAL_TABLET | Freq: Every day | ORAL | Status: DC

## 2015-09-17 NOTE — Progress Notes (Signed)
Pre visit review using our clinic review tool, if applicable. No additional management support is needed unless otherwise documented below in the visit note. 

## 2015-09-17 NOTE — Patient Instructions (Signed)
Start Flonase 2 sprays each nostril daily for 1 month. If you continue this after one month, use 1 spray each nostril daily.  Start Zyrtec once daily at night.  Follow-up in 2 weeks if you're having persistent symptoms that are not improving. Follow-up sooner if you developed fevers or sinus pain, shortness of breath, tooth pain or you are worsening.

## 2015-09-17 NOTE — Progress Notes (Signed)
HPI:   Rhinitis: -started: 2 weeks ago  -symptoms: Clear or white nasal congestion, sneezing, itchy nose, itchy eyes, cough, postnasal drip -denies:fever, SOB, NVD, tooth pain, ear pain -has tried: Afrin twice a week and Claritin -sick contacts/travel/risks: no reported flu, strep or tick exposure -Hx of: allergies ROS: See pertinent positives and negatives per HPI.  Past Medical History  Diagnosis Date  . Chicken pox   . Chronic headaches   . Menorrhagia   . Iron deficiency anemia due to chronic blood loss     from heavy periods  . Fibroids     No past surgical history on file.  Family History  Problem Relation Age of Onset  . Hypertension Mother   . Hypertension Maternal Uncle   . Diabetes Maternal Grandfather     Social History   Social History  . Marital Status: Single    Spouse Name: N/A  . Number of Children: N/A  . Years of Education: N/A   Social History Main Topics  . Smoking status: Never Smoker   . Smokeless tobacco: Never Used     Comment: never used tobacco  . Alcohol Use: 0.0 oz/week    0 Standard drinks or equivalent per week     Comment: social  . Drug Use: No  . Sexual Activity:    Partners: Male    Patent examiner Protection: None   Other Topics Concern  . 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  acetaminophen (TYLENOL) 325 MG tablet, Take 650 mg by mouth every 6 (six) hours as needed for mild pain. , Disp: , Rfl:  .  cetirizine (ZYRTEC) 10 MG tablet, Take 1 tablet (10 mg total) by mouth daily., Disp: 30 tablet, Rfl: 1 .  fluticasone (FLONASE) 50 MCG/ACT nasal spray, Place 2 sprays into both nostrils daily., Disp: 16 g, Rfl: 1  EXAM:  Filed Vitals:   09/17/15 1111  BP: 118/86  Pulse: 68  Temp: 97.7 F  (36.5 C)    Body mass index is 32.61 kg/(m^2).  GENERAL: vitals reviewed and listed above, alert, oriented, appears well hydrated and in no acute distress  HEENT: atraumatic, conjunttiva clear, no obvious abnormalities on inspection of external nose and ears, normal appearance of ear canals and TMs, clear nasal congestion, pale boggy enlarged turbinates, mild post oropharyngeal erythema with PND, no tonsillar edema or exudate, no sinus TTP  NECK: no obvious masses on inspection  LUNGS: clear to auscultation bilaterally, no wheezes, rales or rhonchi, good air movement  CV: HRRR, no peripheral edema  MS: moves all extremities without noticeable abnormality  PSYCH: pleasant and cooperative, no obvious depression or anxiety  ASSESSMENT AND PLAN:  Discussed the following assessment and plan:  Other allergic rhinitis  -given HPI and exam findings today, a serious infection or illness is unlikely. We discussed potential etiologies, with  allergic rhinitis being most likely. No signs of a bacterial infection today, though warned that this can develop with persistent congestion and advised follow-up if she is developing signs and symptoms of worsening condition. Start Flonase and changed to Zyrtec. Follow-up in 2 weeks not improving and we'll plan to add Singulair if needed.  Consider allergy testing/allergy referral if this does not help. We discussed treatment side effects, likely course, antibiotic misuse, transmission, and signs of developing a serious illness. -of course, we advised to return or notify a doctor immediately if symptoms worsen or persist or new concerns arise.    Patient Instructions  Start Flonase 2 sprays each nostril daily for 1 month. If you continue this after one month, use 1 spray each nostril daily.  Start Zyrtec once daily at night.  Follow-up in 2 weeks if you're having persistent symptoms that are not improving. Follow-up sooner if you developed fevers or  sinus pain, shortness of breath, tooth pain or you are worsening.       Colin Benton R.

## 2015-10-01 ENCOUNTER — Ambulatory Visit: Payer: BC Managed Care – PPO | Admitting: Hematology & Oncology

## 2015-10-01 ENCOUNTER — Other Ambulatory Visit: Payer: BC Managed Care – PPO

## 2015-10-11 ENCOUNTER — Ambulatory Visit: Payer: BC Managed Care – PPO | Admitting: Hematology & Oncology

## 2015-10-11 ENCOUNTER — Other Ambulatory Visit: Payer: BC Managed Care – PPO

## 2015-10-25 ENCOUNTER — Ambulatory Visit (HOSPITAL_BASED_OUTPATIENT_CLINIC_OR_DEPARTMENT_OTHER): Payer: BC Managed Care – PPO

## 2015-10-25 ENCOUNTER — Other Ambulatory Visit (HOSPITAL_BASED_OUTPATIENT_CLINIC_OR_DEPARTMENT_OTHER): Payer: BC Managed Care – PPO

## 2015-10-25 ENCOUNTER — Encounter: Payer: Self-pay | Admitting: Hematology & Oncology

## 2015-10-25 ENCOUNTER — Ambulatory Visit (HOSPITAL_BASED_OUTPATIENT_CLINIC_OR_DEPARTMENT_OTHER): Payer: BC Managed Care – PPO | Admitting: Hematology & Oncology

## 2015-10-25 VITALS — BP 124/63 | HR 79 | Temp 98.1°F | Resp 16 | Ht 65.0 in | Wt 198.0 lb

## 2015-10-25 VITALS — BP 113/68 | HR 73 | Resp 16

## 2015-10-25 DIAGNOSIS — N921 Excessive and frequent menstruation with irregular cycle: Secondary | ICD-10-CM

## 2015-10-25 DIAGNOSIS — N924 Excessive bleeding in the premenopausal period: Secondary | ICD-10-CM

## 2015-10-25 DIAGNOSIS — D500 Iron deficiency anemia secondary to blood loss (chronic): Secondary | ICD-10-CM

## 2015-10-25 LAB — CBC WITH DIFFERENTIAL (CANCER CENTER ONLY)
BASO#: 0 10*3/uL (ref 0.0–0.2)
BASO%: 0.4 % (ref 0.0–2.0)
EOS%: 6.7 % (ref 0.0–7.0)
Eosinophils Absolute: 0.5 10*3/uL (ref 0.0–0.5)
HCT: 41.5 % (ref 34.8–46.6)
HEMOGLOBIN: 13.6 g/dL (ref 11.6–15.9)
LYMPH#: 1.4 10*3/uL (ref 0.9–3.3)
LYMPH%: 18.9 % (ref 14.0–48.0)
MCH: 28.7 pg (ref 26.0–34.0)
MCHC: 32.8 g/dL (ref 32.0–36.0)
MCV: 88 fL (ref 81–101)
MONO#: 0.5 10*3/uL (ref 0.1–0.9)
MONO%: 7.2 % (ref 0.0–13.0)
NEUT%: 66.8 % (ref 39.6–80.0)
NEUTROS ABS: 4.8 10*3/uL (ref 1.5–6.5)
Platelets: 202 10*3/uL (ref 145–400)
RBC: 4.74 10*6/uL (ref 3.70–5.32)
RDW: 13.1 % (ref 11.1–15.7)
WBC: 7.1 10*3/uL (ref 3.9–10.0)

## 2015-10-25 LAB — IRON AND TIBC
%SAT: 14 % — ABNORMAL LOW (ref 21–57)
IRON: 57 ug/dL (ref 41–142)
TIBC: 405 ug/dL (ref 236–444)
UIBC: 348 ug/dL (ref 120–384)

## 2015-10-25 LAB — FERRITIN: FERRITIN: 11 ng/mL (ref 9–269)

## 2015-10-25 MED ORDER — SODIUM CHLORIDE 0.9 % IV SOLN
510.0000 mg | Freq: Once | INTRAVENOUS | Status: AC
  Administered 2015-10-25: 510 mg via INTRAVENOUS
  Filled 2015-10-25: qty 17

## 2015-10-25 MED ORDER — SODIUM CHLORIDE 0.9 % IV SOLN
Freq: Once | INTRAVENOUS | Status: AC
  Administered 2015-10-25: 13:00:00 via INTRAVENOUS

## 2015-10-25 NOTE — Patient Instructions (Signed)

## 2015-10-25 NOTE — Progress Notes (Signed)
Hematology and Oncology Follow Up Visit  Tamara Ball [id number] Aug 16, 1980 35 y.o. 10/25/2015   Principle Diagnosis:  Iron deficiency anemia due to menometrorrhagia  Current Therapy:   IV iron as needed - dose given June 2017.    Interim History:  Ms. Tamara Ball is here today for a follow-up. She is still having heavy cycles. She actually started yesterday.  She feels tired. She is trying to work full-time. She travels quite a bit for her job. She is not in [place] a couple weeks ago.  She had iron studies back in October last year. Her ferritin was 30. Her iron saturation was 15%. She felt pretty good so we did not give her any iron at that point. I'm sure that her iron studies are lower today.  She has had no rash. She has had a little bit of soreness in the mouth. She chews ice a little bit.   She has had no change in bowel or bladder habits.   She has had no cough. She has had no fever. She has had no nausea or vomiting.   Overall, her performance status is ECOG 0.    Medications:    Medication List       This list is accurate as of: 10/25/15 12:18 PM.  Always use your most recent med list.               acetaminophen 325 MG tablet  Commonly known as:  TYLENOL  Take 650 mg by mouth every 6 (six) hours as needed for mild pain.     cetirizine 10 MG tablet  Commonly known as:  ZYRTEC  Take 1 tablet (10 mg total) by mouth daily.     fluticasone 50 MCG/ACT nasal spray  Commonly known as:  FLONASE  Place 2 sprays into both nostrils daily.        Allergies: No Known Allergies  Past Medical History, Surgical history, Social history, and Family History were reviewed and updated.  Review of Systems: All other 10 point review of systems is negative.   Physical Exam:  height is 5\' 5"  (1.651 m) and weight is 198 lb (89.812 kg). Her oral temperature is 98.1 F (36.7 C). Her blood pressure is 124/63 and her pulse is 79. Her respiration is 16.   Wt Readings from  Last 3 Encounters:  10/25/15 198 lb (89.812 kg)  09/17/15 200 lb 8 oz (90.946 kg)  04/12/15 194 lb 8 oz (88.225 kg)    Head and neck exam shows no ocular or oral lesions. She has no scleral icterus. She has no adenopathy in the neck. Lungs are clear bilaterally. Cardiac exam regular rate and rhythm with no murmurs, rubs or bruits. Abdomen is soft. She has good bowel sounds. There is no fluid wave. There is no palpable liver or spleen tip. Back exam shows no tenderness over the spine, ribs or hips. Extremities shows no clubbing, cyanosis or edema. Neurological exam shows no focal neurological deficits. Skin exam shows no rashes, ecchymoses or petechia.   Lab Results  Component Value Date   WBC 7.1 10/25/2015   HGB 13.6 10/25/2015   HCT 41.5 10/25/2015   MCV 88 10/25/2015   PLT 202 10/25/2015   Lab Results  Component Value Date   FERRITIN 30 02/25/2015   IRON 51 02/25/2015   TIBC 349 02/25/2015   UIBC 298 02/25/2015   IRONPCTSAT 15* 02/25/2015   Lab Results  Component Value Date   RETICCTPCT 1.2 02/25/2015  RBC 4.74 10/25/2015   RETICCTABS 52.1 02/25/2015   No results found for: KPAFRELGTCHN, LAMBDASER, KAPLAMBRATIO No results found for: IGGSERUM, IGA, IGMSERUM No results found for: Odetta Pink, SPEI   Chemistry      Component Value Date/Time   NA 136 03/13/2014 1439   K 4.0 03/13/2014 1439   CL 103 03/13/2014 1439   CO2 22 03/13/2014 1439   BUN 12 03/13/2014 1439   CREATININE 0.78 03/13/2014 1439      Component Value Date/Time   CALCIUM 9.3 03/13/2014 1439   ALKPHOS 51 03/13/2014 1439   AST 11 03/13/2014 1439   ALT <8 03/13/2014 1439   BILITOT 0.3 03/13/2014 1439     Impression and Plan: Ms. Tamara Ball is a 35 year old African-American female. She has significant menometrorrhagia. She has regular monthly cycles. They're quite heavy.   We will go ahead and give her iron today. Her MCV is down. This typically is a  good indicator for her.   I know that the I will make her feel better.   We will subsequently get her back in about 3 or 4 months. We should be able to get her through the summertime.    Volanda Napoleon, MD 6/19/201712:18 PM

## 2015-10-26 LAB — RETICULOCYTES: RETICULOCYTE COUNT: 1.4 % (ref 0.6–2.6)

## 2015-11-26 ENCOUNTER — Encounter: Payer: Self-pay | Admitting: Family Medicine

## 2015-11-26 ENCOUNTER — Ambulatory Visit (INDEPENDENT_AMBULATORY_CARE_PROVIDER_SITE_OTHER): Payer: BC Managed Care – PPO | Admitting: Family Medicine

## 2015-11-26 VITALS — BP 110/80 | HR 84 | Temp 97.3°F | Ht 65.0 in | Wt 200.7 lb

## 2015-11-26 DIAGNOSIS — R5383 Other fatigue: Secondary | ICD-10-CM

## 2015-11-26 DIAGNOSIS — Z6833 Body mass index (BMI) 33.0-33.9, adult: Secondary | ICD-10-CM | POA: Diagnosis not present

## 2015-11-26 DIAGNOSIS — J3089 Other allergic rhinitis: Secondary | ICD-10-CM

## 2015-11-26 MED ORDER — MONTELUKAST SODIUM 10 MG PO TABS: 10.0000 mg | ORAL_TABLET | Freq: Every day | ORAL | Status: DC

## 2015-11-26 MED ORDER — ORLISTAT 60 MG PO CAPS: 60.0000 mg | ORAL_CAPSULE | Freq: Three times a day (TID) | ORAL | Status: DC

## 2015-11-26 NOTE — Progress Notes (Signed)
Pre visit review using our clinic review tool, if applicable. No additional management support is needed unless otherwise documented below in the visit note. 

## 2015-11-26 NOTE — Progress Notes (Signed)
HPI:  Acute visit for several issues:  Allergies: -taking flonase and zyrtec -not much better -still with nasal congestion, PND, sneezing -no fevers, nose bleeds, wheezing  Obesity: -diet and exercise not as good with new job and traveling -wonder if should take medication for this -feels like being obese makes her tired and sluggish   ROS: See pertinent positives and negatives per HPI.  Past Medical History  Diagnosis Date  . Chicken pox   . Chronic headaches   . Menorrhagia   . Iron deficiency anemia due to chronic blood loss     from heavy periods  . Fibroids     No past surgical history on file.  Family History  Problem Relation Age of Onset  . Hypertension Mother   . Hypertension Maternal Uncle   . Diabetes Maternal Grandfather     Social History   Social History  . Marital Status: Single    Spouse Name: N/A  . Number of Children: N/A  . Years of Education: N/A   Social History Main Topics  . Smoking status: Never Smoker   . Smokeless tobacco: Never Used     Comment: never used tobacco  . Alcohol Use: 0.0 oz/week    0 Standard drinks or equivalent per week     Comment: social  . Drug Use: No  . Sexual Activity:    Partners: Male    Patent examiner Protection: None   Other Topics Concern  . 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  acetaminophen (TYLENOL) 325 MG tablet, Take 650 mg by mouth every 6 (six) hours as needed for mild pain. , Disp: , Rfl:  .  cetirizine (ZYRTEC) 10 MG tablet, Take 1 tablet (10 mg total) by mouth daily., Disp: 30 tablet, Rfl: 1 .  fluticasone (FLONASE) 50 MCG/ACT nasal spray, Place 2 sprays into both nostrils daily., Disp: 16 g, Rfl: 1 .  montelukast (SINGULAIR) 10 MG tablet, Take 1 tablet  (10 mg total) by mouth at bedtime., Disp: 30 tablet, Rfl: 3 .  orlistat (ALLI) 60 MG capsule, Take 1 capsule (60 mg total) by mouth 3 (three) times daily with meals., Disp: 90 capsule, Rfl: 3  EXAM:  Filed Vitals:   11/26/15 1350  BP: 110/80  Pulse: 84  Temp: 97.3 F (36.3 C)    Body mass index is 33.4 kg/(m^2).  GENERAL: vitals reviewed and listed above, alert, oriented, appears well hydrated and in no acute distress  HEENT: atraumatic, conjunttiva clear, no obvious abnormalities on inspection of external nose and ears, normal appearance of ear canals and TMs, clear nasal congestion, mild post oropharyngeal erythema with PND, no tonsillar edema or exudate, no sinus TTP  NECK: no obvious masses on inspection  LUNGS: clear to auscultation bilaterally, no wheezes, rales or rhonchi, good air movement  CV: HRRR, no peripheral edema  MS: moves all extremities without noticeable abnormality  PSYCH: pleasant and cooperative, no obvious depression or anxiety  ASSESSMENT AND PLAN:  Discussed the following assessment and plan:  Other allergic rhinitis  BMI 33.0-33.9,adult  Other fatigue - Plan: TSH, Vitamin B12  -labs for fatigue and weight -discussed tx options for obesity -  she opted to try lifestyle and orlistat -add singulair for the allergies after discussion options -follow up for CPE in [place] -Patient advised to return or notify a doctor immediately if symptoms worsen or persist or new concerns arise.  Patient Instructions  BEFORE YOU LEAVE: -follow up: physical in November with pap smear -labs  Start singulair in addition to your other allergy medications.  For the weight: Can try orlistat We recommend the following healthy lifestyle: 1) Small portions - eat off of salad plate instead of dinner plate 2) Eat a healthy clean diet with:  * avoidance of sugar sweetened foods, processed foods, sweetened drinks, white starches (white rice, potatoes, white flour),  red meat, fast foods   * 5-9 servings per day of fresh or frozen fruits and vegetables (not corn or potatoes, not dried or canned)  *nuts and seeds, beans  *olives and olive oil  *small portions of lean meats such as fish and white chicken   *small portions of whole grains 3)Get at least 150 minutes of sweaty aerobic exercise per week 4)reduce stress - counseling, meditation, relaxation to balance other aspects of your life      Colin Benton R., DO

## 2015-11-26 NOTE — Patient Instructions (Addendum)
BEFORE YOU LEAVE: -follow up: physical in November with pap smear -labs  Start singulair in addition to your other allergy medications.  For the weight: Can try orlistat We recommend the following healthy lifestyle: 1) Small portions - eat off of salad plate instead of dinner plate 2) Eat a healthy clean diet with:  * avoidance of sugar sweetened foods, processed foods, sweetened drinks, white starches (white rice, potatoes, white flour), red meat, fast foods   * 5-9 servings per day of fresh or frozen fruits and vegetables (not corn or potatoes, not dried or canned)  *nuts and seeds, beans  *olives and olive oil  *small portions of lean meats such as fish and white chicken   *small portions of whole grains 3)Get at least 150 minutes of sweaty aerobic exercise per week 4)reduce stress - counseling, meditation, relaxation to balance other aspects of your life

## 2015-12-30 ENCOUNTER — Encounter: Payer: Self-pay | Admitting: Family Medicine

## 2015-12-30 ENCOUNTER — Ambulatory Visit (INDEPENDENT_AMBULATORY_CARE_PROVIDER_SITE_OTHER): Payer: BC Managed Care – PPO | Admitting: Family Medicine

## 2015-12-30 VITALS — BP 100/70 | HR 88 | Temp 97.8°F | Ht 65.0 in | Wt 203.4 lb

## 2015-12-30 DIAGNOSIS — R5382 Chronic fatigue, unspecified: Secondary | ICD-10-CM

## 2015-12-30 DIAGNOSIS — J069 Acute upper respiratory infection, unspecified: Secondary | ICD-10-CM | POA: Diagnosis not present

## 2015-12-30 DIAGNOSIS — D500 Iron deficiency anemia secondary to blood loss (chronic): Secondary | ICD-10-CM

## 2015-12-30 NOTE — Progress Notes (Signed)
Pre visit review using our clinic review tool, if applicable. No additional management support is needed unless otherwise documented below in the visit note. 

## 2015-12-30 NOTE — Progress Notes (Signed)
HPI:  Acute visit for:  1) URI: -started 4 days ago -symptoms: nasal congestion, cough, tickle in throat, body aches -denies: fevers, NVD, sore throat, sinus pain, ear pain, rash, SOB, wheezing, tick bite -taking OTC cold medication which help some -chronic allergies  2) Chronic fatigue: -for years -sees onc for her anemia, usually feels better after infusions, but after last infusion energy not improving -admits to chronic stress, inconsistent with healthy diet and exercise -wants to check labs - we actually ordered at last visit but she did not do -denies fevers, unexplained wt loss, depression  ROS: See pertinent positives and negatives per HPI.  Past Medical History:  Diagnosis Date  . Chicken pox   . Chronic headaches   . Fibroids   . Iron deficiency anemia due to chronic blood loss    from heavy periods  . Menorrhagia     No past surgical history on file.  Family History  Problem Relation Age of Onset  . Hypertension Mother   . Hypertension Maternal Uncle   . Diabetes Maternal Grandfather     Social History   Social History  . Marital status: Single    Spouse name: N/A  . Number of children: N/A  . Years of education: N/A   Social History Main Topics  . Smoking status: Never Smoker  . Smokeless tobacco: Never Used     Comment: never used tobacco  . Alcohol use 0.0 oz/week     Comment: social  . Drug use: No  . Sexual activity: Yes    Partners: Male    Birth control/ protection: None   Other Topics Concern  . 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  acetaminophen (TYLENOL) 325 MG tablet, Take 650 mg by mouth every 6 (six) hours as needed for mild pain. , Disp: , Rfl:  .  cetirizine (ZYRTEC) 10 MG tablet, Take 1  tablet (10 mg total) by mouth daily., Disp: 30 tablet, Rfl: 1 .  fluticasone (FLONASE) 50 MCG/ACT nasal spray, Place 2 sprays into both nostrils daily., Disp: 16 g, Rfl: 1 .  montelukast (SINGULAIR) 10 MG tablet, Take 1 tablet (10 mg total) by mouth at bedtime., Disp: 30 tablet, Rfl: 3 .  orlistat (ALLI) 60 MG capsule, Take 1 capsule (60 mg total) by mouth 3 (three) times daily with meals., Disp: 90 capsule, Rfl: 3  EXAM:  Vitals:   12/30/15 1645  BP: 100/70  Pulse: 88  Temp: 97.8 F (36.6 C)    Body mass index is 33.85 kg/m.  GENERAL: vitals reviewed and listed above, alert, oriented, appears well hydrated and in no acute distress  HEENT: atraumatic, conjunttiva clear, no obvious abnormalities on inspection of external nose and ears  NECK: no obvious masses on inspection  LUNGS: clear to auscultation bilaterally, no wheezes, rales or rhonchi, good air movement  CV: HRRR, no peripheral edema  MS: moves all extremities without noticeable abnormality  PSYCH: pleasant and cooperative, no obvious depression or anxiety  ASSESSMENT AND PLAN:  Discussed the following assessment and plan:  Viral upper respiratory illness -likely viral, discussed other causes -symptomatic care, return precautions discussed  Chronic fatigue - Plan: TSH,  Vitamin B12, Vitamin D, 25-hydroxy Iron deficiency anemia due to chronic blood loss -labs per orders when over acute illness -reviewed onc labs -healthy diet and regular exercise and stress management -follow up if persists or worsens  -Patient advised to return or notify a doctor immediately if symptoms worsen or persist or new concerns arise.  Patient Instructions  BEFORE YOU LEAVE: -lab appt in 1 month   We have ordered labs or studies at this visit. It can take up to 1-2 weeks for results and processing. IF results require follow up or explanation, we will call you with instructions. Clinically stable results will be released to your  [facility]. If you have not heard from [name] or cannot find your results in [facility] in 2 weeks please contact our office at [phone].  If you are not yet signed up for [facility], please consider signing up.   We recommend the following healthy lifestyle: 1) Small portions - eat off of salad plate instead of dinner plate 2) Eat a healthy clean diet with avoidance of (less then 1 serving per week) processed foods, sweets, sweetened drinks, white starches, red meat, fast foods and choose instead: * 5-9 servings per day of fresh or frozen fruits and vegetables (not corn, bananas,  potatoes, dried or canned fruits) *nuts and seeds, beans *olives and olive oil *small portions of lean meats such as fish and white chicken  *small portions of whole grains 3)Get at least 150 minutes of sweaty aerobic exercise per week 4)reduce stress - counseling, meditation, relaxation to balance other aspects of your life   INSTRUCTIONS FOR UPPER RESPIRATORY INFECTION:  -plenty of rest and fluids  -nasal saline wash 2-3 times daily (use prepackaged nasal saline or bottled/distilled water if making your own)   -can use AFRIN nasal spray for drainage and nasal congestion - but do NOT use longer then 3-4 days  -can use tylenol (in no history of liver disease) or ibuprofen (if no history of kidney disease, bowel bleeding or significant heart disease) as directed for aches and sorethroat  -in the winter time, using a humidifier at night is helpful (please follow cleaning instructions)  -if you are taking a cough medication - use only as directed, may also try a teaspoon of honey to coat the throat and throat lozenges. If given a cough medication with codeine or hydrocodone or other narcotic please be advised that this contains a strong and  potentially addicting medication. Please follow instructions carefully, take as little as possible and only use AS NEEDED for severe cough. Discuss potential side effects with your  pharmacy. Please do not drive or operate machinery while taking these types of medications. Please do not take other sedating medications, drugs or alcohol while taking this medication without discussing with your doctor.  -for sore throat, salt water gargles can help  -follow up if you have fevers, facial pain, tooth pain, difficulty breathing or are worsening or symptoms persist longer then expected  Upper Respiratory Infection, Adult An upper respiratory infection (URI) is also known as the common cold. It is often caused by a type of germ (virus). Colds are easily spread (contagious). You can pass it to others by kissing, coughing, sneezing, or drinking out of the same glass. Usually, you get better in 1 to 3  weeks.  However, the cough can last for even longer. HOME CARE   Only take medicine as told by your doctor. Follow instructions provided above.  Drink enough water and fluids to keep  your pee (urine) clear or pale yellow.  Get plenty of rest.  Return to work when your temperature is < 100 for 24 hours or as told by your doctor. You may use a face mask and wash your hands to stop your cold from spreading. GET HELP RIGHT AWAY IF:   After the first few days, you feel you are getting worse.  You have questions about your medicine.  You have chills, shortness of breath, or red spit (mucus).  You have pain in the face for more then 1-2 days, especially when you bend forward.  You have a fever, puffy (swollen) neck, pain when you swallow, or white spots in the back of your throat.  You have a bad headache, ear pain, sinus pain, or chest pain.  You have a high-pitched whistling sound when you breathe in and out (wheezing).  You cough up blood.  You have sore muscles or a stiff neck. MAKE SURE YOU:   Understand these instructions.  Will watch your condition.  Will get help right away if you are not doing well or get worse. Document Released: 10/11/2007 Document Revised:  07/17/2011 Document Reviewed: 07/30/2013 [facility] Patient Information 2015 [place], [place]. This information is not intended to replace advice given to you by your health care provider. Make sure you discuss any questions you have with your health care provider.      Colin Benton R., DO

## 2015-12-30 NOTE — Patient Instructions (Addendum)
BEFORE YOU LEAVE: -lab appt in 1 month   We have ordered labs or studies at this visit. It can take up to 1-2 weeks for results and processing. IF results require follow up or explanation, we will call you with instructions. Clinically stable results will be released to your [facility]. If you have not heard from [name] or cannot find your results in [facility] in 2 weeks please contact our office at [phone].  If you are not yet signed up for [facility], please consider signing up.   We recommend the following healthy lifestyle: 1) Small portions - eat off of salad plate instead of dinner plate 2) Eat a healthy clean diet with avoidance of (less then 1 serving per week) processed foods, sweets, sweetened drinks, white starches, red meat, fast foods and choose instead: * 5-9 servings per day of fresh or frozen fruits and vegetables (not corn, bananas,  potatoes, dried or canned fruits) *nuts and seeds, beans *olives and olive oil *small portions of lean meats such as fish and white chicken  *small portions of whole grains 3)Get at least 150 minutes of sweaty aerobic exercise per week 4)reduce stress - counseling, meditation, relaxation to balance other aspects of your life   INSTRUCTIONS FOR UPPER RESPIRATORY INFECTION:  -plenty of rest and fluids  -nasal saline wash 2-3 times daily (use prepackaged nasal saline or bottled/distilled water if making your own)   -can use AFRIN nasal spray for drainage and nasal congestion - but do NOT use longer then 3-4 days  -can use tylenol (in no history of liver disease) or ibuprofen (if no history of kidney disease, bowel bleeding or significant heart disease) as directed for aches and sorethroat  -in the winter time, using a humidifier at night is helpful (please follow cleaning instructions)  -if you are taking a cough medication - use only as directed, may also try a teaspoon of honey to coat the throat and throat lozenges. If given a cough medication  with codeine or hydrocodone or other narcotic please be advised that this contains a strong and  potentially addicting medication. Please follow instructions carefully, take as little as possible and only use AS NEEDED for severe cough. Discuss potential side effects with your pharmacy. Please do not drive or operate machinery while taking these types of medications. Please do not take other sedating medications, drugs or alcohol while taking this medication without discussing with your doctor.  -for sore throat, salt water gargles can help  -follow up if you have fevers, facial pain, tooth pain, difficulty breathing or are worsening or symptoms persist longer then expected  Upper Respiratory Infection, Adult An upper respiratory infection (URI) is also known as the common cold. It is often caused by a type of germ (virus). Colds are easily spread (contagious). You can pass it to others by kissing, coughing, sneezing, or drinking out of the same glass. Usually, you get better in 1 to 3  weeks.  However, the cough can last for even longer. HOME CARE   Only take medicine as told by your doctor. Follow instructions provided above.  Drink enough water and fluids to keep your pee (urine) clear or pale yellow.  Get plenty of rest.  Return to work when your temperature is < 100 for 24 hours or as told by your doctor. You may use a face mask and wash your hands to stop your cold from spreading. GET HELP RIGHT AWAY IF:   After the first few days, you feel  you are getting worse.  You have questions about your medicine.  You have chills, shortness of breath, or red spit (mucus).  You have pain in the face for more then 1-2 days, especially when you bend forward.  You have a fever, puffy (swollen) neck, pain when you swallow, or white spots in the back of your throat.  You have a bad headache, ear pain, sinus pain, or chest pain.  You have a high-pitched whistling sound when you breathe in and out  (wheezing).  You cough up blood.  You have sore muscles or a stiff neck. MAKE SURE YOU:   Understand these instructions.  Will watch your condition.  Will get help right away if you are not doing well or get worse. Document Released: 10/11/2007 Document Revised: 07/17/2011 Document Reviewed: 07/30/2013 [facility] Patient Information 2015 [place], [place]. This information is not intended to replace advice given to you by your health care provider. Make sure you discuss any questions you have with your health care provider.

## 2016-01-24 ENCOUNTER — Ambulatory Visit: Payer: BC Managed Care – PPO | Admitting: Family

## 2016-01-24 ENCOUNTER — Other Ambulatory Visit: Payer: BC Managed Care – PPO

## 2016-01-24 ENCOUNTER — Ambulatory Visit: Payer: BC Managed Care – PPO

## 2016-01-26 ENCOUNTER — Ambulatory Visit (HOSPITAL_BASED_OUTPATIENT_CLINIC_OR_DEPARTMENT_OTHER): Payer: BC Managed Care – PPO | Admitting: Family

## 2016-01-26 ENCOUNTER — Other Ambulatory Visit: Payer: BC Managed Care – PPO

## 2016-01-26 ENCOUNTER — Other Ambulatory Visit (INDEPENDENT_AMBULATORY_CARE_PROVIDER_SITE_OTHER): Payer: BC Managed Care – PPO

## 2016-01-26 ENCOUNTER — Ambulatory Visit (HOSPITAL_BASED_OUTPATIENT_CLINIC_OR_DEPARTMENT_OTHER): Payer: BC Managed Care – PPO

## 2016-01-26 ENCOUNTER — Other Ambulatory Visit (HOSPITAL_BASED_OUTPATIENT_CLINIC_OR_DEPARTMENT_OTHER): Payer: BC Managed Care – PPO

## 2016-01-26 ENCOUNTER — Encounter: Payer: Self-pay | Admitting: Family

## 2016-01-26 VITALS — BP 125/72 | HR 72 | Temp 97.4°F | Resp 16 | Ht 65.0 in | Wt 200.0 lb

## 2016-01-26 DIAGNOSIS — D500 Iron deficiency anemia secondary to blood loss (chronic): Secondary | ICD-10-CM

## 2016-01-26 DIAGNOSIS — R5382 Chronic fatigue, unspecified: Secondary | ICD-10-CM

## 2016-01-26 DIAGNOSIS — R5383 Other fatigue: Secondary | ICD-10-CM | POA: Diagnosis not present

## 2016-01-26 DIAGNOSIS — M2550 Pain in unspecified joint: Secondary | ICD-10-CM

## 2016-01-26 DIAGNOSIS — N920 Excessive and frequent menstruation with regular cycle: Secondary | ICD-10-CM

## 2016-01-26 DIAGNOSIS — N924 Excessive bleeding in the premenopausal period: Secondary | ICD-10-CM

## 2016-01-26 LAB — CBC WITH DIFFERENTIAL (CANCER CENTER ONLY)
BASO#: 0 10*3/uL (ref 0.0–0.2)
BASO%: 0.5 % (ref 0.0–2.0)
EOS ABS: 0.6 10*3/uL — AB (ref 0.0–0.5)
EOS%: 7.9 % — ABNORMAL HIGH (ref 0.0–7.0)
HCT: 39.4 % (ref 34.8–46.6)
HGB: 13.1 g/dL (ref 11.6–15.9)
LYMPH#: 1.4 10*3/uL (ref 0.9–3.3)
LYMPH%: 18.9 % (ref 14.0–48.0)
MCH: 28.8 pg (ref 26.0–34.0)
MCHC: 33.2 g/dL (ref 32.0–36.0)
MCV: 87 fL (ref 81–101)
MONO#: 0.4 10*3/uL (ref 0.1–0.9)
MONO%: 5.8 % (ref 0.0–13.0)
NEUT#: 5.1 10*3/uL (ref 1.5–6.5)
NEUT%: 66.9 % (ref 39.6–80.0)
PLATELETS: 232 10*3/uL (ref 145–400)
RBC: 4.55 10*6/uL (ref 3.70–5.32)
RDW: 13.2 % (ref 11.1–15.7)
WBC: 7.6 10*3/uL (ref 3.9–10.0)

## 2016-01-26 LAB — VITAMIN D 25 HYDROXY (VIT D DEFICIENCY, FRACTURES): VITD: 22.85 ng/mL — AB (ref 30.00–100.00)

## 2016-01-26 LAB — TSH: TSH: 1.28 u[IU]/mL (ref 0.35–4.50)

## 2016-01-26 LAB — IRON AND TIBC
%SAT: 22 % (ref 21–57)
Iron: 71 ug/dL (ref 41–142)
TIBC: 318 ug/dL (ref 236–444)
UIBC: 246 ug/dL (ref 120–384)

## 2016-01-26 LAB — FERRITIN: Ferritin: 26 ng/ml (ref 9–269)

## 2016-01-26 LAB — VITAMIN B12: VITAMIN B 12: 359 pg/mL (ref 211–911)

## 2016-01-26 MED ORDER — SODIUM CHLORIDE 0.9 % IV SOLN
Freq: Once | INTRAVENOUS | Status: AC
  Administered 2016-01-26: 12:00:00 via INTRAVENOUS

## 2016-01-26 MED ORDER — SODIUM CHLORIDE 0.9 % IV SOLN
510.0000 mg | Freq: Once | INTRAVENOUS | Status: AC
  Administered 2016-01-26: 510 mg via INTRAVENOUS
  Filled 2016-01-26: qty 17

## 2016-01-26 NOTE — Patient Instructions (Signed)

## 2016-01-26 NOTE — Progress Notes (Signed)
Hematology and Oncology Follow Up Visit  Tamara Ball [id number] [birth year] 35 y.o. 01/26/2016   Principle Diagnosis:  Iron deficiency anemia secondary to menorrhagia   Current Therapy:   IV iron as needed - last received in June 2017    Interim History:  Tamara Ball is here today for a follow-up. She is feeling quite fatigued and having some joint aches.  Her cycles continue to be regular and heavy. She received a dose of Feraheme in June. Iron saturation at that time was 14% with a ferritin of 11.  No fever, chills, n/v, cough, rash, ice cravings, dizziness, SOB, chest pain, palpitations, abdominal pain or changes in bowel or bladder habits.  No swelling, tenderness, numbness or tingling in her extremities. She has maintained a good appetite and is staying well hydrated. Her weight is stable.   Medications:    Medication List       Accurate as of 01/26/16 11:20 AM. Always use your most recent med list.          acetaminophen 325 MG tablet Commonly known as:  TYLENOL Take 650 mg by mouth every 6 (six) hours as needed for mild pain.   cetirizine 10 MG tablet Commonly known as:  ZYRTEC Take 1 tablet (10 mg total) by mouth daily.   fluticasone 50 MCG/ACT nasal spray Commonly known as:  FLONASE Place 2 sprays into both nostrils daily.   montelukast 10 MG tablet Commonly known as:  SINGULAIR Take 1 tablet (10 mg total) by mouth at bedtime.   orlistat 60 MG capsule Commonly known as:  ALLI Take 1 capsule (60 mg total) by mouth 3 (three) times daily with meals.       Allergies: No Known Allergies  Past Medical History, Surgical history, Social history, and Family History were reviewed and updated.  Review of Systems: All other 10 point review of systems is negative.   Physical Exam:  vitals were not taken for this visit.  Wt Readings from Last 3 Encounters:  12/30/15 203 lb 6.4 oz (92.3 kg)  11/26/15 200 lb 11.2 oz (91 kg)  10/25/15 198 lb (89.8 kg)     Ocular: Sclerae unicteric, pupils equal, round and reactive to light Ear-nose-throat: Oropharynx clear, dentition fair Lymphatic: No cervical supraclavicular or axillary adenopathy Lungs no rales or rhonchi, good excursion bilaterally Heart regular rate and rhythm, no murmur appreciated Abd soft, nontender, positive bowel sounds, no liver or spleen tip palpated on exam MSK no focal spinal tenderness, no joint edema Neuro: non-focal, well-oriented, appropriate affect Breasts: Deferred  Lab Results  Component Value Date   WBC 7.6 01/26/2016   HGB 13.1 01/26/2016   HCT 39.4 01/26/2016   MCV 87 01/26/2016   PLT 232 01/26/2016   Lab Results  Component Value Date   FERRITIN 11 10/25/2015   IRON 57 10/25/2015   TIBC 405 10/25/2015   UIBC 348 10/25/2015   IRONPCTSAT 14 (L) 10/25/2015   Lab Results  Component Value Date   RETICCTPCT 1.2 02/25/2015   RBC 4.55 01/26/2016   RETICCTABS 52.1 02/25/2015   No results found for: KPAFRELGTCHN, LAMBDASER, KAPLAMBRATIO No results found for: IGGSERUM, IGA, IGMSERUM No results found for: Ronnald Ramp, A1GS, A2GS, Tillman Sers, SPEI   Chemistry      Component Value Date/Time   NA 136 03/13/2014 1439   K 4.0 03/13/2014 1439   CL 103 03/13/2014 1439   CO2 22 03/13/2014 1439   BUN 12 03/13/2014 1439   CREATININE 0.78  03/13/2014 1439      Component Value Date/Time   CALCIUM 9.3 03/13/2014 1439   ALKPHOS 51 03/13/2014 1439   AST 11 03/13/2014 1439   ALT <8 03/13/2014 1439   BILITOT 0.3 03/13/2014 1439     Impression and Plan: Tamara Ball is 35 yo African American female with iron deficiency anemia secondary to menorrhagia. She continues to have heavy cycles monthly and is symptomatic with fatigue and joint pain.  We will proceed with one dose of Feraheme today. We will see what her iron studies show and if she will need a second dose of iron in 8 days.  We will plan to see her back in 2 months for labs and  follow-up.  She will contact our office with any questions or concerns. We can certainly see her sooner if need be.   Eliezer Bottom, NP 9/20/201711:20 AM

## 2016-01-27 ENCOUNTER — Encounter: Payer: Self-pay | Admitting: Family Medicine

## 2016-01-27 LAB — RETICULOCYTES: Reticulocyte Count: 1.8 % (ref 0.6–2.6)

## 2016-03-27 NOTE — Progress Notes (Signed)
HPI:  Here for CPE:  -Concerns and/or follow up today: none Iron def anemia 2ndary to menorrhagia - sees gyn (Dr. Garwin Brothers) and hematology. Last cbc looked good. Reports she sees hematology today. She wants pap here today as reports Dr. Garwin Brothers did not do it and has been 3 years. Obesity. Working on [employer] and regular exercise. Vit D insufficiency - advise supplementation. Takes MV.  -Diabetes and Dyslipidemia Screening: normal last year, she wants to recheck for biometric screen for work  -Vaccines: wants flu shot today  -pap history: sees gyn but wants pap here today  -FDLMP:  -sexual activity: yes, female partner, no new partners  -wants STI testing (Hep C if born [birth year]): wants sti testing  -FH breast, colon or ovarian ca: see FH Last mammogram: n/a Last colon cancer screening: n/a   -Alcohol, Tobacco, drug use: see social history  Review of Systems - no fevers, unintentional weight loss, vision loss, hearing loss, chest pain, sob, hemoptysis, melena, hematochezia, hematuria, genital discharge, changing or concerning skin lesions, bleeding, bruising, loc, thoughts of self harm or SI  Past Medical History:  Diagnosis Date  . Chicken pox   . Chronic headaches   . Fibroids   . Iron deficiency anemia due to chronic blood loss    from heavy periods  . Menorrhagia     No past surgical history on file.  Family History  Problem Relation Age of Onset  . Hypertension Mother   . Diabetes Maternal Grandfather   . Hypertension Maternal Uncle     Social History   Social History  . Marital status: Single    Spouse name: N/A  . Number of children: N/A  . Years of education: N/A   Social History Main Topics  . Smoking status: Never Smoker  . Smokeless tobacco: Never Used     Comment: never used tobacco  . Alcohol use 0.0 oz/week     Comment: social  . Drug use: No  . Sexual activity: Yes    Partners: Male    Birth control/ protection: None   Other  Topics Concern  . 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  acetaminophen (TYLENOL) 325 MG tablet, Take 650 mg by mouth every 6 (six) hours as needed for mild pain. , Disp: , Rfl:  .  cetirizine (ZYRTEC) 10 MG tablet, Take 1 tablet (10 mg total) by mouth daily., Disp: 30 tablet, Rfl: 5 .  fluticasone (FLONASE) 50 MCG/ACT nasal spray, Place 2 sprays into both nostrils daily., Disp: 16 g, Rfl: 5 .  montelukast (SINGULAIR) 10 MG tablet, Take 1 tablet (10 mg total) by mouth at bedtime., Disp: 30 tablet, Rfl: 3  EXAM:  Vitals:   03/28/16 0743  BP: 102/80  Pulse: 84  Temp: 98.1 F (36.7 C)   Body mass index is 30.67 kg/m.  GENERAL: vitals reviewed and listed below, alert, oriented, appears well hydrated and in no acute distress  HEENT: head atraumatic, PERRLA, normal appearance of eyes, ears, nose and mouth. moist mucus membranes.  NECK: supple, no masses or lymphadenopathy  LUNGS: clear to auscultation bilaterally, no rales, rhonchi or wheeze  CV: HRRR, no peripheral edema  or cyanosis, normal pedal pulses  BREAST: normal appearance - no lesions or discharge, on palpation normal breast tissue without any suspicious masses  ABDOMEN: bowel sounds normal, soft, non tender to palpation, no masses, no rebound or guarding  GU: normal appearance of external genitalia - no lesions or masses, normal vaginal mucosa - no abnormal discharge, normal appearance of cervix - no lesions, white thick yeasty appearing discharge, no masses or tenderness on palpation of uterus and ovaries. Pap obtained.  RECTAL: refused  SKIN: no rash or abnormal lesions  MS: normal gait, moves all extremities normally  NEURO: normal gait, speech and thought processing grossly intact,  muscle tone grossly intact throughout  PSYCH: normal affect, pleasant and cooperative  ASSESSMENT AND PLAN:  Discussed the following assessment and plan:  Encounter for preventive health examination - Plan: Lipid Panel, Hemoglobin A1c, HIV antibody (with reflex)  BMI 30.0-30.9,adult  -Discussed and advised all [name] preventive services health task force level A and B recommendations for age, sex and risks.  -Advised at least 150 minutes of exercise per week and a healthy diet with avoidance of (less then 1 serving per week) processed foods, white starches, red meat, fast foods and sweets and consisting of: * 5-9 servings of fresh fruits and vegetables (not corn or potatoes) *nuts and seeds, beans *olives and olive oil *lean meats such as fish and white chicken  *whole grains  -FASTING labs (she opted to do in January as needs at a specific time for work), studies and vaccines per orders this encounter  Orders Placed This Encounter  Procedures  . Lipid Panel  . Hemoglobin A1c  . HIV antibody (with reflex)    Patient advised to return to clinic immediately if symptoms worsen or persist or new concerns.  Patient Instructions  BEFORE YOU LEAVE: -flu shot -labs -follow up: 6 months  Vit D3 [phone] IU daily  We have ordered labs and a pap smear at this visit. It can take up to 1-2 weeks for results and processing. IF results require follow up or explanation, we will call you with instructions. Clinically stable results will be released to your [facility]. If you have not heard from [name] or cannot find your results in [facility] in 2 weeks please contact our office at [phone].  If you are not yet signed up for [facility], please consider signing up.   We recommend the following healthy lifestyle for LIFE: 1) Small portions.   Tip: eat off of a salad plate instead of a dinner plate.  Tip: if you need more or a snack choose fruits, veggies and/or a handful of nuts or seeds.  2) Eat a  healthy clean diet.  * Tip: Avoid (less then 1 serving per week): processed foods, sweets, sweetened drinks, white starches (rice, flour, bread, potatoes, pasta, etc), red meat, fast foods, butter  *Tip: CHOOSE instead   * 5-9 servings per day of fresh or frozen fruits and vegetables (but not corn, potatoes, bananas, canned or dried fruit)   *nuts and seeds, beans   *olives and olive oil   *small portions of lean meats such as fish and white chicken    *small portions of whole grains  3)Get at least 150 minutes of sweaty aerobic exercise per week.  4)Reduce stress - consider counseling, meditation and relaxation to balance other aspects of your life.          No Follow-up on file.  Colin Benton R., DO

## 2016-03-28 ENCOUNTER — Other Ambulatory Visit (HOSPITAL_COMMUNITY)
Admission: RE | Admit: 2016-03-28 | Discharge: 2016-03-28 | Disposition: A | Discharge status: Home / Self Care | Payer: BC Managed Care – PPO | Source: Ambulatory Visit | Attending: Family Medicine | Admitting: Family Medicine

## 2016-03-28 ENCOUNTER — Ambulatory Visit (INDEPENDENT_AMBULATORY_CARE_PROVIDER_SITE_OTHER): Payer: BC Managed Care – PPO | Admitting: Family Medicine

## 2016-03-28 ENCOUNTER — Encounter: Payer: Self-pay | Admitting: Family Medicine

## 2016-03-28 VITALS — BP 102/80 | HR 84 | Temp 98.1°F | Ht 67.0 in | Wt 195.8 lb

## 2016-03-28 DIAGNOSIS — Z0000 Encounter for general adult medical examination without abnormal findings: Secondary | ICD-10-CM

## 2016-03-28 DIAGNOSIS — Z1151 Encounter for screening for human papillomavirus (HPV): Secondary | ICD-10-CM | POA: Insufficient documentation

## 2016-03-28 DIAGNOSIS — Z113 Encounter for screening for infections with a predominantly sexual mode of transmission: Secondary | ICD-10-CM | POA: Diagnosis present

## 2016-03-28 DIAGNOSIS — Z6830 Body mass index (BMI) 30.0-30.9, adult: Secondary | ICD-10-CM | POA: Diagnosis not present

## 2016-03-28 DIAGNOSIS — Z23 Encounter for immunization: Secondary | ICD-10-CM

## 2016-03-28 DIAGNOSIS — Z124 Encounter for screening for malignant neoplasm of cervix: Secondary | ICD-10-CM | POA: Diagnosis not present

## 2016-03-28 DIAGNOSIS — Z01419 Encounter for gynecological examination (general) (routine) without abnormal findings: Secondary | ICD-10-CM | POA: Insufficient documentation

## 2016-03-28 MED ORDER — FLUTICASONE PROPIONATE 50 MCG/ACT NA SUSP: 2.0000 | Freq: Every day | NASAL | 5 refills | Status: DC

## 2016-03-28 MED ORDER — CETIRIZINE HCL 10 MG PO TABS: 10.0000 mg | ORAL_TABLET | Freq: Every day | ORAL | 5 refills | Status: DC

## 2016-03-28 NOTE — Addendum Note (Signed)
Addended by: Agnes [facility] on: 03/28/2016 08:19 AM   Modules accepted: Orders

## 2016-03-28 NOTE — Progress Notes (Signed)
Pre visit review using our clinic review tool, if applicable. No additional management support is needed unless otherwise documented below in the visit note. 

## 2016-03-28 NOTE — Patient Instructions (Signed)
BEFORE YOU LEAVE: -flu shot -labs -follow up: 6 months  Vit D3 [phone] IU daily  We have ordered labs and a pap smear at this visit. It can take up to 1-2 weeks for results and processing. IF results require follow up or explanation, we will call you with instructions. Clinically stable results will be released to your [facility]. If you have not heard from [name] or cannot find your results in [facility] in 2 weeks please contact our office at [phone].  If you are not yet signed up for [facility], please consider signing up.   We recommend the following healthy lifestyle for LIFE: 1) Small portions.   Tip: eat off of a salad plate instead of a dinner plate.  Tip: if you need more or a snack choose fruits, veggies and/or a handful of nuts or seeds.  2) Eat a healthy clean diet.  * Tip: Avoid (less then 1 serving per week): processed foods, sweets, sweetened drinks, white starches (rice, flour, bread, potatoes, pasta, etc), red meat, fast foods, butter  *Tip: CHOOSE instead   * 5-9 servings per day of fresh or frozen fruits and vegetables (but not corn, potatoes, bananas, canned or dried fruit)   *nuts and seeds, beans   *olives and olive oil   *small portions of lean meats such as fish and white chicken    *small portions of whole grains  3)Get at least 150 minutes of sweaty aerobic exercise per week.  4)Reduce stress - consider counseling, meditation and relaxation to balance other aspects of your life.

## 2016-03-29 ENCOUNTER — Ambulatory Visit (HOSPITAL_BASED_OUTPATIENT_CLINIC_OR_DEPARTMENT_OTHER): Payer: BC Managed Care – PPO | Admitting: Family

## 2016-03-29 ENCOUNTER — Encounter: Payer: Self-pay | Admitting: Family

## 2016-03-29 ENCOUNTER — Other Ambulatory Visit (HOSPITAL_BASED_OUTPATIENT_CLINIC_OR_DEPARTMENT_OTHER): Payer: BC Managed Care – PPO

## 2016-03-29 VITALS — BP 123/72 | HR 73 | Temp 97.3°F | Wt 195.0 lb

## 2016-03-29 DIAGNOSIS — N920 Excessive and frequent menstruation with regular cycle: Secondary | ICD-10-CM | POA: Diagnosis not present

## 2016-03-29 DIAGNOSIS — D500 Iron deficiency anemia secondary to blood loss (chronic): Secondary | ICD-10-CM | POA: Diagnosis not present

## 2016-03-29 LAB — CBC WITH DIFFERENTIAL (CANCER CENTER ONLY)
BASO#: 0 10*3/uL (ref 0.0–0.2)
BASO%: 0.4 % (ref 0.0–2.0)
EOS%: 4.6 % (ref 0.0–7.0)
Eosinophils Absolute: 0.4 10*3/uL (ref 0.0–0.5)
HCT: 42.6 % (ref 34.8–46.6)
HGB: 14.1 g/dL (ref 11.6–15.9)
LYMPH#: 1.2 10*3/uL (ref 0.9–3.3)
LYMPH%: 14.9 % (ref 14.0–48.0)
MCH: 29.2 pg (ref 26.0–34.0)
MCHC: 33.1 g/dL (ref 32.0–36.0)
MCV: 88 fL (ref 81–101)
MONO#: 0.6 10*3/uL (ref 0.1–0.9)
MONO%: 6.9 % (ref 0.0–13.0)
NEUT#: 6 10*3/uL (ref 1.5–6.5)
NEUT%: 73.2 % (ref 39.6–80.0)
Platelets: 166 10*3/uL (ref 145–400)
RBC: 4.83 10*6/uL (ref 3.70–5.32)
RDW: 14.4 % (ref 11.1–15.7)
WBC: 8.1 10*3/uL (ref 3.9–10.0)

## 2016-03-29 LAB — IRON AND TIBC
%SAT: 24 % (ref 21–57)
IRON: 77 ug/dL (ref 41–142)
TIBC: 320 ug/dL (ref 236–444)
UIBC: 243 ug/dL (ref 120–384)

## 2016-03-29 LAB — FERRITIN: FERRITIN: 49 ng/mL (ref 9–269)

## 2016-03-29 NOTE — Progress Notes (Deleted)
I spoke with patient for overview of new oral chemotherapy medication: ***. Pt is doing well. The prescriptions have been sent to the [facility] for benefit analysis and approval.  Counseled patient on administration, dosing, side effects, safe handling, and monitoring. Side effects include but not limited to: ***. *** voiced understanding and appreciation.  All questions answered. Will follow up with patient regarding insurance and pharmacy. Will follow up in 1-2 weeks for adherence and toxicity management.    Thank you,

## 2016-03-29 NOTE — Progress Notes (Signed)
Hematology and Oncology Follow Up Visit  Tamara Ball [id number] [birth year] 35 y.o. 03/29/2016   Principle Diagnosis:  Iron deficiency anemia secondary to menorrhagia   Current Therapy:   IV iron as needed - last received in September 2017    Interim History:  Tamara Ball is here today for a follow-up. She is doing well but still has intermittent fatigue. Her cycles continue to be heavy and regular.  She received a dose of Feraheme in September. Iron saturation at that time was 22% with a ferritin of 26.  No episodes of bleeding or bruising. No lymphadenopathy found on exam.  No fever, chills, n/v, cough, rash, ice cravings, dizziness, SOB, chest pain, palpitations, abdominal pain or changes in bowel or bladder habits.  No swelling, tenderness, numbness or tingling in her extremities. She has maintained a good appetite and is staying well hydrated. Her weight is stable.   Medications:    Medication List       Accurate as of 03/29/16  9:44 AM. Always use your most recent med list.          acetaminophen 325 MG tablet Commonly known as:  TYLENOL Take 650 mg by mouth every 6 (six) hours as needed for mild pain.   cetirizine 10 MG tablet Commonly known as:  ZYRTEC Take 1 tablet (10 mg total) by mouth daily.   fluticasone 50 MCG/ACT nasal spray Commonly known as:  FLONASE Place 2 sprays into both nostrils daily.   montelukast 10 MG tablet Commonly known as:  SINGULAIR Take 1 tablet (10 mg total) by mouth at bedtime.       Allergies: No Known Allergies  Past Medical History, Surgical history, Social history, and Family History were reviewed and updated.  Review of Systems: All other 10 point review of systems is negative.   Physical Exam:  vitals were not taken for this visit.  Wt Readings from Last 3 Encounters:  03/28/16 195 lb 12.8 oz (88.8 kg)  01/26/16 200 lb (90.7 kg)  12/30/15 203 lb 6.4 oz (92.3 kg)    Ocular: Sclerae unicteric, pupils equal, round  and reactive to light Ear-nose-throat: Oropharynx clear, dentition fair Lymphatic: No cervical supraclavicular or axillary adenopathy Lungs no rales or rhonchi, good excursion bilaterally Heart regular rate and rhythm, no murmur appreciated Abd soft, nontender, positive bowel sounds, no liver or spleen tip palpated on exam MSK no focal spinal tenderness, no joint edema Neuro: non-focal, well-oriented, appropriate affect Breasts: Deferred  Lab Results  Component Value Date   WBC 8.1 03/29/2016   HGB 14.1 03/29/2016   HCT 42.6 03/29/2016   MCV 88 03/29/2016   PLT 166 03/29/2016   Lab Results  Component Value Date   FERRITIN 26 01/26/2016   IRON 71 01/26/2016   TIBC 318 01/26/2016   UIBC 246 01/26/2016   IRONPCTSAT 22 01/26/2016   Lab Results  Component Value Date   RETICCTPCT 1.2 02/25/2015   RBC 4.83 03/29/2016   RETICCTABS 52.1 02/25/2015   No results found for: KPAFRELGTCHN, LAMBDASER, KAPLAMBRATIO No results found for: IGGSERUM, IGA, IGMSERUM No results found for: Ronnald Ramp, A1GS, A2GS, Tillman Sers, SPEI   Chemistry      Component Value Date/Time   NA 136 03/13/2014 1439   K 4.0 03/13/2014 1439   CL 103 03/13/2014 1439   CO2 22 03/13/2014 1439   BUN 12 03/13/2014 1439   CREATININE 0.78 03/13/2014 1439      Component Value Date/Time   CALCIUM 9.3  03/13/2014 1439   ALKPHOS 51 03/13/2014 1439   AST 11 03/13/2014 1439   ALT <8 03/13/2014 1439   BILITOT 0.3 03/13/2014 1439     Impression and Plan: Tamara Ball is 35 yo African American female with iron deficiency anemia secondary to menorrhagia. She has had a nice response to [place] so far. She still has some occasional fatigue. Her CBC does not reflect anemia. We will see what her iron studies show and bring her in next week for an infusion if needed.    We will plan to see her back in 3 months for labs and follow-up.  She will contact our office with any questions or concerns. We  can certainly see her sooner if need be.   Eliezer Bottom, NP 11/22/20179:44 AM

## 2016-03-30 LAB — RETICULOCYTES: Reticulocyte Count: 1.4 % (ref 0.6–2.6)

## 2016-03-31 LAB — CYTOLOGY - PAP
Chlamydia: NEGATIVE
Diagnosis: NEGATIVE
HPV: DETECTED — AB
Neisseria Gonorrhea: NEGATIVE
Trichomonas: NEGATIVE

## 2016-04-03 ENCOUNTER — Encounter: Payer: Self-pay | Admitting: Family Medicine

## 2016-04-03 ENCOUNTER — Telehealth: Payer: Self-pay | Admitting: Family Medicine

## 2016-04-03 LAB — CERVICOVAGINAL ANCILLARY ONLY: Candida vaginitis: NEGATIVE

## 2016-04-03 NOTE — Telephone Encounter (Signed)
Discussed results with pt. She plans to see Dr. Garwin Brothers for repeat pap and would like copy of pap report faxed to Dr. Harvie Bridge office. Please fax. Thanks.

## 2016-04-03 NOTE — Telephone Encounter (Signed)
Pt would like to have a copy of her PAP sent to the [facility]   Dr. Maudry Diego  [phone]  And would to have Dr. [name] give her a call.

## 2016-04-03 NOTE — Telephone Encounter (Signed)
Per Earlene Plater she faxed the results to Dr Garwin Brothers office.

## 2016-04-04 ENCOUNTER — Encounter: Payer: Self-pay | Admitting: Family Medicine

## 2016-04-04 MED ORDER — METRONIDAZOLE 500 MG PO TABS: 500.0000 mg | ORAL_TABLET | Freq: Two times a day (BID) | ORAL | 0 refills | Status: DC

## 2016-04-04 NOTE — Addendum Note (Signed)
Addended by: Agnes Lawrence on: 04/04/2016 05:22 PM   Modules accepted: Orders

## 2016-04-18 ENCOUNTER — Encounter: Payer: Self-pay | Admitting: Family Medicine

## 2016-04-20 ENCOUNTER — Other Ambulatory Visit: Payer: Self-pay | Admitting: *Deleted

## 2016-04-20 ENCOUNTER — Encounter: Payer: Self-pay | Admitting: Family Medicine

## 2016-04-20 MED ORDER — FLUCONAZOLE 150 MG PO TABS: 150.0000 mg | ORAL_TABLET | Freq: Once | ORAL | 0 refills | Status: AC

## 2016-04-20 NOTE — Telephone Encounter (Signed)
Rx done. 

## 2016-05-16 ENCOUNTER — Other Ambulatory Visit (INDEPENDENT_AMBULATORY_CARE_PROVIDER_SITE_OTHER): Payer: BC Managed Care – PPO

## 2016-05-16 DIAGNOSIS — D500 Iron deficiency anemia secondary to blood loss (chronic): Secondary | ICD-10-CM

## 2016-05-16 DIAGNOSIS — N920 Excessive and frequent menstruation with regular cycle: Secondary | ICD-10-CM

## 2016-05-16 DIAGNOSIS — Z0000 Encounter for general adult medical examination without abnormal findings: Secondary | ICD-10-CM

## 2016-05-16 LAB — LIPID PANEL
Cholesterol: 113 mg/dL (ref 0–200)
HDL: 51 mg/dL (ref >39.00)
LDL Cholesterol: 54 mg/dL (ref 0–99)
NonHDL: 62.46
Total CHOL/HDL Ratio: 2
Triglycerides: 40 mg/dL (ref 0.0–149.0)
VLDL: 8 mg/dL (ref 0.0–40.0)

## 2016-05-16 LAB — HEMOGLOBIN A1C: Hgb A1c MFr Bld: 5.1 % (ref 4.6–6.5)

## 2016-05-17 LAB — RETICULOCYTES
ABS Retic: 64400 cells/uL (ref 20000–80000)
RBC.: 4.6 MIL/uL (ref 3.80–5.10)
Retic Ct Pct: 1.4 %

## 2016-06-13 ENCOUNTER — Encounter: Payer: Self-pay | Admitting: *Deleted

## 2016-06-13 ENCOUNTER — Telehealth: Payer: Self-pay | Admitting: Family Medicine

## 2016-06-13 ENCOUNTER — Encounter: Payer: Self-pay | Admitting: Family Medicine

## 2016-06-13 ENCOUNTER — Ambulatory Visit (INDEPENDENT_AMBULATORY_CARE_PROVIDER_SITE_OTHER): Payer: BC Managed Care – PPO | Admitting: Family Medicine

## 2016-06-13 VITALS — BP 112/78 | HR 48 | Temp 97.7°F | Ht 67.0 in | Wt 192.5 lb

## 2016-06-13 DIAGNOSIS — M791 Myalgia, unspecified site: Secondary | ICD-10-CM

## 2016-06-13 DIAGNOSIS — M545 Low back pain, unspecified: Secondary | ICD-10-CM

## 2016-06-13 MED ORDER — CYCLOBENZAPRINE HCL 5 MG PO TABS: 5.0000 mg | ORAL_TABLET | Freq: Every evening | ORAL | 0 refills | Status: DC | PRN

## 2016-06-13 NOTE — Telephone Encounter (Signed)
Ok to write letter - seen for muscle soreness and may benefit from massage therapy or physical therapy.
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
## 2016-06-13 NOTE — Telephone Encounter (Signed)
Pt would like to have a note stating that you recommend PT or Massage Therapy so that she can use her flex spending account.

## 2016-06-13 NOTE — Patient Instructions (Signed)
BEFORE YOU LEAVE: -follow up: 1 month -work note, seen and may return to work today -low back exercises  For the muscle soreness - do the exercises 4 days per week. Muscle relaxer at night as needed. Heat, topical over the counter sports creams, aleve or tylenol if needed for pain per instructions. Keep moving.

## 2016-06-13 NOTE — Telephone Encounter (Signed)
I called the pt and informed her the letter was completed and left at the front desk for her to pick up.

## 2016-06-13 NOTE — Progress Notes (Signed)
Pre visit review using our clinic review tool, if applicable. No additional management support is needed unless otherwise documented below in the visit note. 

## 2016-06-30 ENCOUNTER — Other Ambulatory Visit: Payer: Self-pay | Admitting: Family

## 2016-06-30 ENCOUNTER — Ambulatory Visit (HOSPITAL_BASED_OUTPATIENT_CLINIC_OR_DEPARTMENT_OTHER): Payer: BC Managed Care – PPO | Admitting: Family

## 2016-06-30 ENCOUNTER — Other Ambulatory Visit (HOSPITAL_BASED_OUTPATIENT_CLINIC_OR_DEPARTMENT_OTHER): Payer: BC Managed Care – PPO

## 2016-06-30 VITALS — BP 130/76 | HR 78 | Temp 98.2°F | Resp 17 | Wt 193.0 lb

## 2016-06-30 DIAGNOSIS — D500 Iron deficiency anemia secondary to blood loss (chronic): Secondary | ICD-10-CM | POA: Diagnosis not present

## 2016-06-30 DIAGNOSIS — N920 Excessive and frequent menstruation with regular cycle: Secondary | ICD-10-CM | POA: Diagnosis not present

## 2016-06-30 LAB — CBC WITH DIFFERENTIAL (CANCER CENTER ONLY)
BASO#: 0 10*3/uL (ref 0.0–0.2)
BASO%: 0.4 % (ref 0.0–2.0)
EOS%: 3 % (ref 0.0–7.0)
Eosinophils Absolute: 0.3 10*3/uL (ref 0.0–0.5)
HCT: 40.1 % (ref 34.8–46.6)
HGB: 13.2 g/dL (ref 11.6–15.9)
LYMPH#: 1.6 10*3/uL (ref 0.9–3.3)
LYMPH%: 18.4 % (ref 14.0–48.0)
MCH: 30 pg (ref 26.0–34.0)
MCHC: 32.9 g/dL (ref 32.0–36.0)
MCV: 91 fL (ref 81–101)
MONO#: 0.5 10*3/uL (ref 0.1–0.9)
MONO%: 6.3 % (ref 0.0–13.0)
NEUT#: 6.1 10*3/uL (ref 1.5–6.5)
NEUT%: 71.9 % (ref 39.6–80.0)
Platelets: 209 10*3/uL (ref 145–400)
RBC: 4.4 10*6/uL (ref 3.70–5.32)
RDW: 13.2 % (ref 11.1–15.7)
WBC: 8.4 10*3/uL (ref 3.9–10.0)

## 2016-06-30 NOTE — Progress Notes (Signed)
Hematology and Oncology Follow Up Visit  LOUETTA ZIESKE [id number] January 07, 1981 36 y.o. 06/30/2016   Principle Diagnosis:  Iron deficiency anemia secondary to menorrhagia   Current Therapy:   IV iron as needed - last received in September 2017    Interim History:  Ms. Torsiello is here today for a follow-up. She symptomatic at this time with dizziness and feelings of spinning, fatigue, palpitations, headache and nausea without vomiting.  Her cycles are quite heavy lasting 7 days with history of uterine fibroids. Her last dose of IV iron was in September 2017.  No other bleeding and no petechiae or bruising. No lymphadenopathy found on exam.  No fever, chills, cough, rash, ice cravings, SOB, chest pain, abdominal pain or changes in bowel or bladder habits. She has constipation at times and takes a stool softener as needed.  No swelling, tenderness, numbness or tingling in her extremities. No c/o pain at this time.  She has maintained a good appetite and is staying well hydrated. Her weight is stable.   Medications:  Allergies as of 06/30/2016   No Known Allergies     Medication List       Accurate as of 06/30/16  2:57 PM. Always use your most recent med list.          acetaminophen 325 MG tablet Commonly known as:  TYLENOL Take 650 mg by mouth every 6 (six) hours as needed for mild pain.   cyclobenzaprine 5 MG tablet Commonly known as:  FLEXERIL Take 1 tablet (5 mg total) by mouth at bedtime as needed for muscle spasms.   DYMISTA 137-50 MCG/ACT Susp Generic drug:  Azelastine-Fluticasone   levocetirizine 5 MG tablet Commonly known as:  XYZAL   montelukast 10 MG tablet Commonly known as:  SINGULAIR Take 1 tablet (10 mg total) by mouth at bedtime.       Allergies: No Known Allergies  Past Medical History, Surgical history, Social history, and Family History were reviewed and updated.  Review of Systems: All other 10 point review of systems is negative.   Physical  Exam:  vitals were not taken for this visit.  Wt Readings from Last 3 Encounters:  06/13/16 192 lb 8 oz (87.3 kg)  03/28/16 195 lb 12.8 oz (88.8 kg)  01/26/16 200 lb (90.7 kg)    Ocular: Sclerae unicteric, pupils equal, round and reactive to light Ear-nose-throat: Oropharynx clear, dentition fair Lymphatic: No cervical supraclavicular or axillary adenopathy Lungs no rales or rhonchi, good excursion bilaterally Heart regular rate and rhythm, no murmur appreciated Abd soft, nontender, positive bowel sounds, no liver or spleen tip palpated on exam MSK no focal spinal tenderness, no joint edema Neuro: non-focal, well-oriented, appropriate affect Breasts: Deferred  Lab Results  Component Value Date   WBC 8.1 03/29/2016   HGB 14.1 03/29/2016   HCT 42.6 03/29/2016   MCV 88 03/29/2016   PLT 166 03/29/2016   Lab Results  Component Value Date   FERRITIN 49 03/29/2016   IRON 77 03/29/2016   TIBC 320 03/29/2016   UIBC 243 03/29/2016   IRONPCTSAT 24 03/29/2016   Lab Results  Component Value Date   RETICCTPCT 1.4 05/16/2016   RBC 4.60 05/16/2016   RETICCTABS 64,400 05/16/2016   No results found for: KPAFRELGTCHN, LAMBDASER, KAPLAMBRATIO No results found for: IGGSERUM, IGA, IGMSERUM No results found for: Ronnald Ramp, A1GS, A2GS, Violet Baldy, MSPIKE, SPEI   Chemistry      Component Value Date/Time   NA 136 03/13/2014 1439  K 4.0 03/13/2014 1439   CL 103 03/13/2014 1439   CO2 22 03/13/2014 1439   BUN 12 03/13/2014 1439   CREATININE 0.78 03/13/2014 1439      Component Value Date/Time   CALCIUM 9.3 03/13/2014 1439   ALKPHOS 51 03/13/2014 1439   AST 11 03/13/2014 1439   ALT <8 03/13/2014 1439   BILITOT 0.3 03/13/2014 1439     Impression and Plan: Ms. Popa is 36 yo African American female with iron deficiency anemia secondary to menorrhagia. Her cycles continue to heavy and regular. She has tried birth control in the past and states this made her feel  "too emotional" even at a reduced dose. She is not interested in any for of birth control at this time.  We will see what her iron studies show and bring her back in next week for an infusion if needed.  We will go ahead and plan to see her back in 6 weeks for labs and follow-up.  She will contact our office with any questions or concerns. We can certainly see her sooner if need be.   Eliezer Bottom, NP 2/23/20182:57 PM

## 2016-07-01 LAB — RETICULOCYTES: Reticulocyte Count: 1.6 % (ref 0.6–2.6)

## 2016-07-03 ENCOUNTER — Other Ambulatory Visit: Payer: Self-pay | Admitting: Family

## 2016-07-03 ENCOUNTER — Telehealth: Payer: Self-pay | Admitting: *Deleted

## 2016-07-03 LAB — FERRITIN: Ferritin: 13 ng/ml (ref 9–269)

## 2016-07-03 LAB — IRON AND TIBC
%SAT: 10 % — ABNORMAL LOW (ref 21–57)
Iron: 38 ug/dL — ABNORMAL LOW (ref 41–142)
TIBC: 391 ug/dL (ref 236–444)
UIBC: 353 ug/dL (ref 120–384)

## 2016-07-03 NOTE — Telephone Encounter (Addendum)
Patient aware  ----- Message from Eliezer Bottom, NP sent at 07/03/2016 11:37 AM EST ----- Regarding: Iron  Iron still quite low. She will need two doses of IV iron scheduled. POF sent to [facility]. Thank you!  Sarah  ----- Message ----- From: Interface, Lab In Three Zero One Sent: 06/30/2016   3:05 PM To: Eliezer Bottom, NP

## 2016-07-07 ENCOUNTER — Ambulatory Visit (HOSPITAL_BASED_OUTPATIENT_CLINIC_OR_DEPARTMENT_OTHER): Payer: BC Managed Care – PPO

## 2016-07-07 VITALS — BP 114/71 | HR 76 | Temp 97.6°F | Resp 20

## 2016-07-07 DIAGNOSIS — D500 Iron deficiency anemia secondary to blood loss (chronic): Secondary | ICD-10-CM

## 2016-07-07 DIAGNOSIS — N920 Excessive and frequent menstruation with regular cycle: Secondary | ICD-10-CM | POA: Diagnosis not present

## 2016-07-07 MED ORDER — SODIUM CHLORIDE 0.9 % IV SOLN
Freq: Once | INTRAVENOUS | Status: AC
  Administered 2016-07-07: 09:00:00 via INTRAVENOUS

## 2016-07-07 MED ORDER — SODIUM CHLORIDE 0.9 % IV SOLN
510.0000 mg | Freq: Once | INTRAVENOUS | Status: AC
  Administered 2016-07-07: 510 mg via INTRAVENOUS
  Filled 2016-07-07: qty 17

## 2016-07-07 NOTE — Patient Instructions (Signed)

## 2016-07-11 ENCOUNTER — Encounter: Payer: Self-pay | Admitting: Family

## 2016-07-14 ENCOUNTER — Ambulatory Visit (HOSPITAL_BASED_OUTPATIENT_CLINIC_OR_DEPARTMENT_OTHER): Payer: BC Managed Care – PPO

## 2016-07-14 VITALS — BP 119/66 | HR 88 | Temp 97.5°F | Resp 16

## 2016-07-14 DIAGNOSIS — N920 Excessive and frequent menstruation with regular cycle: Secondary | ICD-10-CM

## 2016-07-14 DIAGNOSIS — D500 Iron deficiency anemia secondary to blood loss (chronic): Secondary | ICD-10-CM | POA: Diagnosis not present

## 2016-07-14 MED ORDER — SODIUM CHLORIDE 0.9 % IV SOLN
Freq: Once | INTRAVENOUS | Status: AC
  Administered 2016-07-14: 16:00:00 via INTRAVENOUS

## 2016-07-14 MED ORDER — SODIUM CHLORIDE 0.9 % IV SOLN
510.0000 mg | Freq: Once | INTRAVENOUS | Status: AC
  Administered 2016-07-14: 510 mg via INTRAVENOUS
  Filled 2016-07-14: qty 17

## 2016-07-14 NOTE — Patient Instructions (Signed)
Anemia, Nonspecific Anemia is a condition in which the concentration of red blood cells or hemoglobin in the blood is below normal. Hemoglobin is a substance in red blood cells that carries oxygen to the tissues of the body. Anemia results in not enough oxygen reaching these tissues. What are the causes? Common causes of anemia include:  Excessive bleeding. Bleeding may be internal or external. This includes excessive bleeding from periods (in women) or from the intestine.  Poor nutrition.  Chronic kidney, thyroid, and liver disease.  Bone marrow disorders that decrease red blood cell production.  Cancer and treatments for cancer.  HIV, AIDS, and their treatments.  Spleen problems that increase red blood cell destruction.  Blood disorders.  Excess destruction of red blood cells due to infection, medicines, and autoimmune disorders. What are the signs or symptoms?  Minor weakness.  Dizziness.  Headache.  Palpitations.  Shortness of breath, especially with exercise.  Paleness.  Cold sensitivity.  Indigestion.  Nausea.  Difficulty sleeping.  Difficulty concentrating. Symptoms may occur suddenly or they may develop slowly. How is this diagnosed? Additional blood tests are often needed. These help your health care provider determine the best treatment. Your health care provider will check your stool for blood and look for other causes of blood loss. How is this treated? Treatment varies depending on the cause of the anemia. Treatment can include:  Supplements of iron, vitamin B12, or folic acid.  Hormone medicines.  A blood transfusion. This may be needed if blood loss is severe.  Hospitalization. This may be needed if there is significant continual blood loss.  Dietary changes.  Spleen removal. Follow these instructions at home: Keep all follow-up appointments. It often takes many weeks to correct anemia, and having your health care provider check on your  condition and your response to treatment is very important. Get help right away if:  You develop extreme weakness, shortness of breath, or chest pain.  You become dizzy or have trouble concentrating.  You develop heavy vaginal bleeding.  You develop a rash.  You have bloody or black, tarry stools.  You faint.  You vomit up blood.  You vomit repeatedly.  You have abdominal pain.  You have a fever or persistent symptoms for more than 2-3 days.  You have a fever and your symptoms suddenly get worse.  You are dehydrated. This information is not intended to replace advice given to you by your health care provider. Make sure you discuss any questions you have with your health care provider. Document Released: 06/01/2004 Document Revised: 10/06/2015 Document Reviewed: 10/18/2012 Elsevier Interactive Patient Education  2017 Elsevier Inc.  

## 2016-07-17 ENCOUNTER — Ambulatory Visit: Payer: BC Managed Care – PPO | Admitting: Family Medicine

## 2016-07-26 ENCOUNTER — Other Ambulatory Visit: Payer: Self-pay | Admitting: Specialist

## 2016-07-26 DIAGNOSIS — G43719 Chronic migraine without aura, intractable, without status migrainosus: Secondary | ICD-10-CM

## 2016-08-03 ENCOUNTER — Inpatient Hospital Stay
Admission: RE | Admit: 2016-08-03 | Discharge: 2016-08-03 | Disposition: A | Discharge status: Home / Self Care | Payer: BC Managed Care – PPO | Source: Ambulatory Visit | Attending: Specialist | Admitting: Specialist

## 2016-08-11 ENCOUNTER — Other Ambulatory Visit (HOSPITAL_BASED_OUTPATIENT_CLINIC_OR_DEPARTMENT_OTHER): Payer: BC Managed Care – PPO

## 2016-08-11 ENCOUNTER — Ambulatory Visit (HOSPITAL_BASED_OUTPATIENT_CLINIC_OR_DEPARTMENT_OTHER): Payer: BC Managed Care – PPO | Admitting: Family

## 2016-08-11 VITALS — BP 111/74 | HR 71 | Temp 98.5°F | Resp 16 | Wt 190.8 lb

## 2016-08-11 DIAGNOSIS — N920 Excessive and frequent menstruation with regular cycle: Secondary | ICD-10-CM

## 2016-08-11 DIAGNOSIS — K920 Hematemesis: Secondary | ICD-10-CM | POA: Diagnosis not present

## 2016-08-11 DIAGNOSIS — D500 Iron deficiency anemia secondary to blood loss (chronic): Secondary | ICD-10-CM | POA: Diagnosis not present

## 2016-08-11 LAB — CBC WITH DIFFERENTIAL (CANCER CENTER ONLY)
BASO#: 0 10*3/uL (ref 0.0–0.2)
BASO%: 0.5 % (ref 0.0–2.0)
EOS%: 4.1 % (ref 0.0–7.0)
Eosinophils Absolute: 0.3 10*3/uL (ref 0.0–0.5)
HCT: 43.8 % (ref 34.8–46.6)
HGB: 14.5 g/dL (ref 11.6–15.9)
LYMPH#: 1.6 10*3/uL (ref 0.9–3.3)
LYMPH%: 19.9 % (ref 14.0–48.0)
MCH: 30.3 pg (ref 26.0–34.0)
MCHC: 33.1 g/dL (ref 32.0–36.0)
MCV: 91 fL (ref 81–101)
MONO#: 0.6 10*3/uL (ref 0.1–0.9)
MONO%: 7.4 % (ref 0.0–13.0)
NEUT#: 5.3 10*3/uL (ref 1.5–6.5)
NEUT%: 68.1 % (ref 39.6–80.0)
Platelets: 181 10*3/uL (ref 145–400)
RBC: 4.79 10*6/uL (ref 3.70–5.32)
RDW: 13.9 % (ref 11.1–15.7)
WBC: 7.8 10*3/uL (ref 3.9–10.0)

## 2016-08-11 LAB — COMPREHENSIVE METABOLIC PANEL (CC13)
ALT: 8 IU/L (ref 0–32)
AST (SGOT): 9 IU/L (ref 0–40)
Albumin, Serum: 4.2 g/dL (ref 3.5–5.5)
Albumin/Globulin Ratio: 1.4 (ref 1.2–2.2)
Alkaline Phosphatase, S: 58 IU/L (ref 39–117)
BUN/Creatinine Ratio: 15 (ref 9–23)
BUN: 13 mg/dL (ref 6–20)
Bilirubin Total: 0.5 mg/dL (ref 0.0–1.2)
Calcium, Ser: 9.5 mg/dL (ref 8.7–10.2)
Carbon Dioxide, Total: 26 mmol/L (ref 18–29)
Chloride, Ser: 103 mmol/L (ref 96–106)
Creatinine, Ser: 0.87 mg/dL (ref 0.57–1.00)
GFR calc Af Amer: 98 mL/min/{1.73_m2} (ref >59)
GFR calc non Af Amer: 85 mL/min/{1.73_m2} (ref >59)
Globulin, Total: 3.1 g/dL (ref 1.5–4.5)
Glucose: 92 mg/dL (ref 65–99)
Potassium, Ser: 4.2 mmol/L (ref 3.5–5.2)
Sodium: 136 mmol/L (ref 134–144)
Total Protein: 7.3 g/dL (ref 6.0–8.5)

## 2016-08-11 NOTE — Progress Notes (Signed)
Hematology and Oncology Follow Up Visit  Tamara Ball [id number] [birth year] 36 y.o. 08/11/2016   Principle Diagnosis:  Iron deficiency anemia secondary to menorrhagia   Current Therapy:   IV iron as needed - last received in March 2018  Interim History:  Tamara Ball is here today for follow-up. She received IV iron last month for an iron saturation of 10%. Her fatigue is a little better. Her cycles are still quite heavy. She states that she has an appointment with her gynecologist in May to discuss a new birth control.  Hgb is stable at 14.5 with an MCV of 91. Iron studies for today are pending.  She has been traveling a lot for work and states that she has not been sleeping well.  No fever, chills, n/v, cough, rash, dizziness, SOB, chest pain, palpitations, abdominal pain or changed in bladder habits.  She has had an occasional upset stomach due to adding meat back into her diet. She is doing this slowly.  No swelling, tenderness, numbness or tingling in her extremities. No c/o pain at this time.  She has maintained a good appetite but admits that she needs to drink more fluids.  ECOG Performance Status: 1 - Symptomatic but completely ambulatory  Medications:  Allergies as of 08/11/2016   No Known Allergies     Medication List       Accurate as of 08/11/16  2:16 PM. Always use your most recent med list.          acetaminophen 325 MG tablet Commonly known as:  TYLENOL Take 650 mg by mouth every 6 (six) hours as needed for mild pain.   cyclobenzaprine 5 MG tablet Commonly known as:  FLEXERIL Take 1 tablet (5 mg total) by mouth at bedtime as needed for muscle spasms.   DYMISTA 137-50 MCG/ACT Susp Generic drug:  Azelastine-Fluticasone   levocetirizine 5 MG tablet Commonly known as:  XYZAL   montelukast 10 MG tablet Commonly known as:  SINGULAIR Take 1 tablet (10 mg total) by mouth at bedtime.       Allergies: No Known Allergies  Past Medical History, Surgical  history, Social history, and Family History were reviewed and updated.  Review of Systems: All other 10 point review of systems is negative.   Physical Exam:  vitals were not taken for this visit.  Wt Readings from Last 3 Encounters:  06/30/16 193 lb (87.5 kg)  06/13/16 192 lb 8 oz (87.3 kg)  03/28/16 195 lb 12.8 oz (88.8 kg)    Ocular: Sclerae unicteric, pupils equal, round and reactive to light Ear-nose-throat: Oropharynx clear, dentition fair Lymphatic: No cervical, supraclavicular or axillary adenopathy Lungs no rales or rhonchi, good excursion bilaterally Heart regular rate and rhythm, no murmur appreciated Abd soft, nontender, positive bowel sounds, no liver or spleen tip palpated on exam, no fluid wave MSK no focal spinal tenderness, no joint edema Neuro: non-focal, well-oriented, appropriate affect Breasts: Deferred  Lab Results  Component Value Date   WBC 8.4 06/30/2016   HGB 13.2 06/30/2016   HCT 40.1 06/30/2016   MCV 91 06/30/2016   PLT 209 06/30/2016   Lab Results  Component Value Date   FERRITIN 13 06/30/2016   IRON 38 (L) 06/30/2016   TIBC 391 06/30/2016   UIBC 353 06/30/2016   IRONPCTSAT 10 (L) 06/30/2016   Lab Results  Component Value Date   RETICCTPCT 1.4 05/16/2016   RBC 4.40 06/30/2016   RETICCTABS 64,400 05/16/2016   No results found for: KPAFRELGTCHN,  LAMBDASER, KAPLAMBRATIO No results found for: IGGSERUM, IGA, IGMSERUM No results found for: Odetta Pink, SPEI   Chemistry      Component Value Date/Time   NA 136 03/13/2014 1439   K 4.0 03/13/2014 1439   CL 103 03/13/2014 1439   CO2 22 03/13/2014 1439   BUN 12 03/13/2014 1439   CREATININE 0.78 03/13/2014 1439      Component Value Date/Time   CALCIUM 9.3 03/13/2014 1439   ALKPHOS 51 03/13/2014 1439   AST 11 03/13/2014 1439   ALT <8 03/13/2014 1439   BILITOT 0.3 03/13/2014 1439     Impression and Plan: Tamara Ball is pleasant 36 yo  African American female with iron deficiency anemia secondary to menorrhagia. Her cycles continue to heavy and regular. She has an appointment with gynecology next month to discuss a different type of birth control.  She seems to have responded nicely to the IV iron she received last month. She is still having some fatigue at times.  We will see what her iron studies show and bring her back in next week for an infusion if needed.  I spent about 15 minutes face to face counseling the patient.  We will go ahead and plan to see her back in 3 months for repeat lab work and follow-up.  She will contact our office with any questions or concerns. We can certainly see her sooner if need be.   Eliezer Bottom, NP 4/6/20182:16 PM

## 2016-08-12 LAB — RETICULOCYTES: Reticulocyte Count: 1.6 % (ref 0.6–2.6)

## 2016-08-14 ENCOUNTER — Telehealth: Payer: Self-pay | Admitting: *Deleted

## 2016-08-14 LAB — IRON AND TIBC
%SAT: 42 % (ref 21–57)
Iron: 132 ug/dL (ref 41–142)
TIBC: 316 ug/dL (ref 236–444)
UIBC: 184 ug/dL (ref 120–384)

## 2016-08-14 LAB — FERRITIN: Ferritin: 215 ng/ml (ref 9–269)

## 2016-08-14 NOTE — Telephone Encounter (Addendum)
Generic message left on patient's personal voice mail  ----- Message from Eliezer Bottom, NP sent at 08/14/2016  2:39 PM EDT ----- Regarding: Iron  Iron studies look much better. No infusion needed at this time. Thank you!  Sarah  ----- Message ----- From: Interface, Lab In Three Zero One Sent: 08/11/2016   2:19 PM To: Eliezer Bottom, NP

## 2016-10-16 ENCOUNTER — Encounter (HOSPITAL_BASED_OUTPATIENT_CLINIC_OR_DEPARTMENT_OTHER): Payer: Self-pay

## 2016-10-16 ENCOUNTER — Emergency Department (HOSPITAL_BASED_OUTPATIENT_CLINIC_OR_DEPARTMENT_OTHER)
Admission: EM | Admit: 2016-10-16 | Discharge: 2016-10-16 | Disposition: A | Discharge status: Home / Self Care | Payer: BC Managed Care – PPO | Attending: Emergency Medicine | Admitting: Emergency Medicine

## 2016-10-16 ENCOUNTER — Emergency Department (HOSPITAL_BASED_OUTPATIENT_CLINIC_OR_DEPARTMENT_OTHER): Payer: BC Managed Care – PPO

## 2016-10-16 DIAGNOSIS — R079 Chest pain, unspecified: Secondary | ICD-10-CM | POA: Diagnosis present

## 2016-10-16 DIAGNOSIS — R072 Precordial pain: Secondary | ICD-10-CM | POA: Diagnosis not present

## 2016-10-16 LAB — CBC WITH DIFFERENTIAL/PLATELET
BASOS ABS: 0 10*3/uL (ref 0.0–0.1)
BASOS PCT: 0 %
EOS PCT: 4 %
Eosinophils Absolute: 0.3 10*3/uL (ref 0.0–0.7)
HCT: 39.5 % (ref 36.0–46.0)
Hemoglobin: 13.2 g/dL (ref 12.0–15.0)
LYMPHS PCT: 21 %
Lymphs Abs: 1.7 10*3/uL (ref 0.7–4.0)
MCH: 30.5 pg (ref 26.0–34.0)
MCHC: 33.4 g/dL (ref 30.0–36.0)
MCV: 91.2 fL (ref 78.0–100.0)
MONO ABS: 0.6 10*3/uL (ref 0.1–1.0)
MONOS PCT: 8 %
Neutro Abs: 5.5 10*3/uL (ref 1.7–7.7)
Neutrophils Relative %: 67 %
PLATELETS: 201 10*3/uL (ref 150–400)
RBC: 4.33 MIL/uL (ref 3.87–5.11)
RDW: 13 % (ref 11.5–15.5)
WBC: 8.2 10*3/uL (ref 4.0–10.5)

## 2016-10-16 LAB — BASIC METABOLIC PANEL
Anion gap: 8 (ref 5–15)
BUN: 15 mg/dL (ref 6–20)
CO2: 25 mmol/L (ref 22–32)
CREATININE: 0.85 mg/dL (ref 0.44–1.00)
Calcium: 9.1 mg/dL (ref 8.9–10.3)
Chloride: 107 mmol/L (ref 101–111)
GFR calc Af Amer: >60 mL/min (ref >60)
GLUCOSE: 103 mg/dL — AB (ref 65–99)
Potassium: 3.9 mmol/L (ref 3.5–5.1)
SODIUM: 140 mmol/L (ref 135–145)

## 2016-10-16 LAB — TROPONIN I

## 2016-10-16 MED ORDER — IBUPROFEN 800 MG PO TABS: 800.0000 mg | ORAL_TABLET | Freq: Three times a day (TID) | ORAL | 0 refills | Status: DC | PRN

## 2016-10-16 NOTE — ED Triage Notes (Signed)
C/o CP x 2 days-pain worse with sneeze, cough, blow nose-NAD

## 2016-10-16 NOTE — Discharge Instructions (Signed)

## 2016-10-16 NOTE — ED Provider Notes (Signed)
Emergency Department Provider Note  By signing my name below, I, Soijett Blue, attest that this documentation has been prepared under the direction and in the presence of Raji Glinski, Wonda Olds, MD. Electronically Signed: King William, ED Scribe. 10/16/16. 6:55 PM.   I have reviewed the triage vital signs and the nursing notes.   HISTORY  Chief Complaint Chest Pain   HPI Tamara Ball is a 36 y.o. female with a PMHx of chronic headaches, who presents to the Emergency Department complaining of constant, progressively worsening, sternal, CP onset.2 days ago. Pt sternal CP is worsened with sneezing and coughing. She reports that she noticed an increase in her sternal CP as she was eating lunch today. Pt reports associated mid back pain and HA. Pt reports that her HA isn't similar to her hx of migraines. Pt has tried ibuprofen with no relief of her symptoms. Denies cough, abdominal pain, and any other symptoms. Pt denies estrogen use, birth control use, or PMHx of DVT or PE.     Past Medical History:  Diagnosis Date  . Chicken pox   . Chronic headaches   . Fibroids   . Iron deficiency anemia due to chronic blood loss    from heavy periods  . Menorrhagia     Patient Active Problem List   Diagnosis Date Noted  . Menorrhagia   . Iron deficiency anemia due to chronic blood loss   . Allergic rhinitis 02/05/2014    History reviewed. No pertinent surgical history.  Current Outpatient Rx  . Order #: [id number] Class: Historical Med  . Order #: [id number] Class: Historical Med  . Order #: [id number] Class: Print  . Order #: [id number] Class: Historical Med  . Order #: [id number] Class: Normal    Allergies Patient has no known allergies.  Family History  Problem Relation Age of Onset  . Hypertension Mother   . Diabetes Maternal Grandfather   . Hypertension Maternal Uncle     Social History Social History  Substance Use Topics  . Smoking status: Never Smoker  . Smokeless tobacco:  Never Used  . Alcohol use 0.0 oz/week     Comment: social    Review of Systems Constitutional: No fever/chills Eyes: No visual changes. ENT: No sore throat. Cardiovascular: Positive for sternal chest pain. Respiratory: Denies shortness of breath. Gastrointestinal: No abdominal pain.  No nausea, no vomiting.  No diarrhea.  No constipation. Genitourinary: Negative for dysuria. Musculoskeletal: Positive for mid back pain. Skin: Negative for rash. Neurological: Positive for headache. Negative for focal weakness or numbness.  10-point ROS otherwise negative.  ____________________________________________   PHYSICAL EXAM:  VITAL SIGNS: ED Triage Vitals  Enc Vitals Group     BP 10/16/16 1824 (!) 137/104     Pulse Rate 10/16/16 1824 77     Resp 10/16/16 1824 18     Temp 10/16/16 1824 99.3 F (37.4 C)     Temp Source 10/16/16 1824 Oral     SpO2 10/16/16 1824 99 %     Weight 10/16/16 1824 190 lb (86.2 kg)     Height 10/16/16 1824 5\' 6"  (1.676 m)     Pain Score 10/16/16 1825 4   Constitutional: Alert and oriented. Well appearing and in no acute distress. Eyes: Conjunctivae are normal.  Head: Atraumatic. Nose: No congestion/rhinnorhea. Mouth/Throat: Mucous membranes are moist.  Neck: No stridor.  Cardiovascular: Normal rate, regular rhythm. Good peripheral circulation. Grossly normal heart sounds.   Respiratory: Normal respiratory effort.  No retractions. Lungs CTAB. Gastrointestinal: Soft and nontender.  No distention.  Musculoskeletal: No lower extremity tenderness nor edema. No gross deformities of extremities. Neurologic:  Normal speech and language. No gross focal neurologic deficits are appreciated.  Skin:  Skin is warm, dry and intact. No rash noted.  ____________________________________________   LABS (all labs ordered are listed, but only abnormal results are displayed)  Labs Reviewed  BASIC METABOLIC PANEL - Abnormal; Notable for the following:       Result Value    Glucose, Bld 103 (*)    All other components within normal limits  TROPONIN I  CBC WITH DIFFERENTIAL/PLATELET   ____________________________________________  EKG   EKG Interpretation  Date/Time:  Monday October 16 2016 18:25:52 EDT Ventricular Rate:  76 PR Interval:  136 QRS Duration: 82 QT Interval:  360 QTC Calculation: 405 R Axis:   73 Text Interpretation:  Sinus rhythm with frequent Premature ventricular complexes Otherwise normal ECG No STEMI. Similar to prior.  Confirmed by Nanda Quinton [phone]) on 10/16/2016 6:44:29 PM Also confirmed by Nanda Quinton [phone]), editor Drema Pry [phone])  on 10/17/2016 7:27:38 AM       ____________________________________________  RADIOLOGY  Dg Chest 2 View  Result Date: 10/16/2016 CLINICAL DATA:  Central chest pain worse with coughing and deep inspiration. EXAM: CHEST  2 VIEW COMPARISON:  02/10/2014 FINDINGS: Lungs are adequately inflated without airspace consolidation or effusion. Cardiomediastinal silhouette, bones and soft tissues are within normal. IMPRESSION: No active cardiopulmonary disease. Electronically Signed   By: Marin Olp M.D.   On: 10/16/2016 19:40    ____________________________________________   PROCEDURES  Procedure(s) performed:   Procedures  None ____________________________________________   INITIAL IMPRESSION / ASSESSMENT AND PLAN / ED COURSE  Pertinent labs & imaging results that were available during my care of the patient were reviewed by me and considered in my medical decision making (see chart for details).  Patient presents to the ED for evaluation of atypical chest pain most consistent with chest wall pain. Low risk by Rock Nephew for PE and PERC negative. Troponin negative with persistent pain all day. Normal CXR. Normal abdominal exam. Plan for PCP follow up with Motrin for pain.   At this time, I do not feel there is any life-threatening condition present. I have reviewed and discussed all  results (EKG, imaging, lab, urine as appropriate), exam findings with patient. I have reviewed nursing notes and appropriate previous records.  I feel the patient is safe to be discharged home without further emergent workup. Discussed usual and customary return precautions. Patient and family (if present) verbalize understanding and are comfortable with this plan.  Patient will follow-up with their primary care provider. If they do not have a primary care provider, information for follow-up has been provided to them. All questions have been answered.    ____________________________________________  FINAL CLINICAL IMPRESSION(S) / ED DIAGNOSES  Final diagnoses:  Precordial chest pain     MEDICATIONS GIVEN DURING THIS VISIT:  Medications - No data to display   NEW OUTPATIENT MEDICATIONS STARTED DURING THIS VISIT:  Discharge Medication List as of 10/16/2016  8:08 PM    START taking these medications   Details  ibuprofen (ADVIL,MOTRIN) 800 MG tablet Take 1 tablet (800 mg total) by mouth every 8 (eight) hours as needed., Starting Mon 10/16/2016, Print         Note:  This document was prepared using Dragon voice recognition software and may include unintentional dictation errors.  Nanda Quinton, MD Emergency Medicine  I personally performed the services described in this documentation,  which was scribed in my presence. The recorded information has been reviewed and is accurate.       Margette Fast, MD 10/17/16 [phone]

## 2016-11-17 ENCOUNTER — Other Ambulatory Visit: Payer: BC Managed Care – PPO

## 2016-11-17 ENCOUNTER — Ambulatory Visit: Payer: BC Managed Care – PPO | Admitting: Family

## 2016-12-01 ENCOUNTER — Other Ambulatory Visit (HOSPITAL_BASED_OUTPATIENT_CLINIC_OR_DEPARTMENT_OTHER): Payer: BC Managed Care – PPO

## 2016-12-01 ENCOUNTER — Ambulatory Visit (HOSPITAL_BASED_OUTPATIENT_CLINIC_OR_DEPARTMENT_OTHER): Payer: BC Managed Care – PPO | Admitting: Family

## 2016-12-01 VITALS — BP 117/79 | HR 60 | Temp 98.3°F | Resp 16 | Wt 200.0 lb

## 2016-12-01 DIAGNOSIS — N920 Excessive and frequent menstruation with regular cycle: Secondary | ICD-10-CM | POA: Diagnosis not present

## 2016-12-01 DIAGNOSIS — D500 Iron deficiency anemia secondary to blood loss (chronic): Secondary | ICD-10-CM

## 2016-12-01 LAB — CBC WITH DIFFERENTIAL (CANCER CENTER ONLY)
BASO#: 0 10*3/uL (ref 0.0–0.2)
BASO%: 0.3 % (ref 0.0–2.0)
EOS%: 5.7 % (ref 0.0–7.0)
Eosinophils Absolute: 0.4 10*3/uL (ref 0.0–0.5)
HEMATOCRIT: 41.1 % (ref 34.8–46.6)
HGB: 13.7 g/dL (ref 11.6–15.9)
LYMPH#: 1.6 10*3/uL (ref 0.9–3.3)
LYMPH%: 21.1 % (ref 14.0–48.0)
MCH: 30.4 pg (ref 26.0–34.0)
MCHC: 33.3 g/dL (ref 32.0–36.0)
MCV: 91 fL (ref 81–101)
MONO#: 0.5 10*3/uL (ref 0.1–0.9)
MONO%: 6.6 % (ref 0.0–13.0)
NEUT#: 5 10*3/uL (ref 1.5–6.5)
NEUT%: 66.3 % (ref 39.6–80.0)
Platelets: 170 10*3/uL (ref 145–400)
RBC: 4.5 10*6/uL (ref 3.70–5.32)
RDW: 13.4 % (ref 11.1–15.7)
WBC: 7.6 10*3/uL (ref 3.9–10.0)

## 2016-12-01 LAB — IRON AND TIBC
%SAT: 31 % (ref 21–57)
IRON: 117 ug/dL (ref 41–142)
TIBC: 378 ug/dL (ref 236–444)
UIBC: 261 ug/dL (ref 120–384)

## 2016-12-01 LAB — FERRITIN: FERRITIN: 39 ng/mL (ref 9–269)

## 2016-12-01 NOTE — Progress Notes (Signed)
Hematology and Oncology Follow Up Visit  Tamara Ball [id number] 14-Jan-1981 36 y.o. 12/01/2016   Principle Diagnosis:  Iron deficiency anemia secondary to menorrhagia   Current Therapy:   IV iron as needed - last received in March 2018   Interim History:  Tamara Ball is here today for follow-up and lab. She is feeling a little fatigued. She had a very heavy cycle last month before starting her new birth control. She states that her gynecologist is having her skip the placebo week for 3 months and then will re-evaluate.  Her last IV iron infusion was in March. Iron studies for today are pending.  No fever, chills, n/v, cough, rash, dizziness, SOB, chest pain, palpitations, abdominal pain or changes in bowel or bladder habits.  No swelling, tenderness, numbness or tingling in her extremities. No c/o pain.  She has had an increase in her appetite and is staying well hydrated. Her weight is up 10 lbs since her last visit.   ECOG Performance Status: 1 - Symptomatic but completely ambulatory  Medications:  Allergies as of 12/01/2016   No Known Allergies     Medication List       Accurate as of 12/01/16 10:12 AM. Always use your most recent med list.          acetaminophen 325 MG tablet Commonly known as:  TYLENOL Take 650 mg by mouth every 6 (six) hours as needed for mild pain.   DYMISTA 137-50 MCG/ACT Susp Generic drug:  Azelastine-Fluticasone   ibuprofen 800 MG tablet Commonly known as:  ADVIL,MOTRIN Take 1 tablet (800 mg total) by mouth every 8 (eight) hours as needed.   levocetirizine 5 MG tablet Commonly known as:  XYZAL   montelukast 10 MG tablet Commonly known as:  SINGULAIR Take 1 tablet (10 mg total) by mouth at bedtime.       Allergies: No Known Allergies  Past Medical History, Surgical history, Social history, and Family History were reviewed and updated.  Review of Systems: All other 10 point review of systems is negative.   Physical Exam:  vitals  were not taken for this visit.  Wt Readings from Last 3 Encounters:  10/16/16 190 lb (86.2 kg)  08/11/16 190 lb 12.8 oz (86.5 kg)  06/30/16 193 lb (87.5 kg)    Ocular: Sclerae unicteric, pupils equal, round and reactive to light Ear-nose-throat: Oropharynx clear, dentition fair Lymphatic: No cervical or supraclavicular adenopathy Lungs no rales or rhonchi, good excursion bilaterally Heart regular rate and rhythm, no murmur appreciated Abd soft, nontender, positive bowel sounds, no liver or spleen tip palpated on exam, no fluid wave  MSK no focal spinal tenderness, no joint edema Neuro: non-focal, well-oriented, appropriate affect Breasts: Deferred   Lab Results  Component Value Date   WBC 8.2 10/16/2016   HGB 13.2 10/16/2016   HCT 39.5 10/16/2016   MCV 91.2 10/16/2016   PLT 201 10/16/2016   Lab Results  Component Value Date   FERRITIN 215 08/11/2016   IRON 132 08/11/2016   TIBC 316 08/11/2016   UIBC 184 08/11/2016   IRONPCTSAT 42 08/11/2016   Lab Results  Component Value Date   RETICCTPCT 1.4 05/16/2016   RBC 4.33 10/16/2016   RETICCTABS 64,400 05/16/2016   No results found for: KPAFRELGTCHN, LAMBDASER, KAPLAMBRATIO No results found for: IGGSERUM, IGA, IGMSERUM No results found for: TOTALPROTELP, ALBUMINELP, A1GS, A2GS, BETS, BETA2SER, GAMS, MSPIKE, SPEI   Chemistry      Component Value Date/Time   NA 140 10/16/2016  1853   NA 136 08/11/2016 1404   K 3.9 10/16/2016 1853   K 4.2 08/11/2016 1404   CL 107 10/16/2016 1853   CL 103 08/11/2016 1404   CO2 25 10/16/2016 1853   CO2 26 08/11/2016 1404   BUN 15 10/16/2016 1853   BUN 13 08/11/2016 1404   CREATININE 0.85 10/16/2016 1853   CREATININE 0.87 08/11/2016 1404      Component Value Date/Time   CALCIUM 9.1 10/16/2016 1853   CALCIUM 9.5 08/11/2016 1404   ALKPHOS 58 08/11/2016 1404   AST 9 08/11/2016 1404   ALT 8 08/11/2016 1404   BILITOT 0.5 08/11/2016 1404      Impression and Plan: Tamara Ball is a  pleasant 36 yo African American female with iron deficiency anemia secondary to heavy cycles. She has started a new birth control and will be skipping the placebo week for the next 3 months. Gynecology with then re-evaluate.  We will see what her iron studies show and bring her back in next week for an infusion if needed.  We will plan to see her back again in another 3 months for repeat lab work and follow-up.  She will contact our office with any questions or concerns. We can certainly see her sooner if need be.   Eliezer Bottom, NP 7/27/201810:12 AM

## 2017-01-25 ENCOUNTER — Encounter: Payer: Self-pay | Admitting: Family Medicine

## 2017-03-02 ENCOUNTER — Other Ambulatory Visit: Payer: BC Managed Care – PPO

## 2017-03-02 ENCOUNTER — Ambulatory Visit: Payer: BC Managed Care – PPO | Admitting: Family

## 2017-03-08 ENCOUNTER — Other Ambulatory Visit: Payer: BC Managed Care – PPO

## 2017-03-08 ENCOUNTER — Ambulatory Visit: Payer: BC Managed Care – PPO | Admitting: Family

## 2017-04-20 ENCOUNTER — Other Ambulatory Visit (HOSPITAL_BASED_OUTPATIENT_CLINIC_OR_DEPARTMENT_OTHER): Payer: BC Managed Care – PPO

## 2017-04-20 ENCOUNTER — Ambulatory Visit (HOSPITAL_BASED_OUTPATIENT_CLINIC_OR_DEPARTMENT_OTHER): Payer: BC Managed Care – PPO | Admitting: Family

## 2017-04-20 ENCOUNTER — Other Ambulatory Visit: Payer: Self-pay

## 2017-04-20 VITALS — BP 117/59 | HR 67 | Temp 97.8°F | Resp 18 | Wt 207.0 lb

## 2017-04-20 DIAGNOSIS — N921 Excessive and frequent menstruation with irregular cycle: Secondary | ICD-10-CM

## 2017-04-20 DIAGNOSIS — N920 Excessive and frequent menstruation with regular cycle: Secondary | ICD-10-CM

## 2017-04-20 DIAGNOSIS — D500 Iron deficiency anemia secondary to blood loss (chronic): Secondary | ICD-10-CM | POA: Diagnosis not present

## 2017-04-20 LAB — CBC WITH DIFFERENTIAL (CANCER CENTER ONLY)
BASO#: 0.1 10*3/uL (ref 0.0–0.2)
BASO%: 0.7 % (ref 0.0–2.0)
EOS%: 5.1 % (ref 0.0–7.0)
Eosinophils Absolute: 0.4 10*3/uL (ref 0.0–0.5)
HCT: 43.2 % (ref 34.8–46.6)
HGB: 14.2 g/dL (ref 11.6–15.9)
LYMPH#: 1.6 10*3/uL (ref 0.9–3.3)
LYMPH%: 20.9 % (ref 14.0–48.0)
MCH: 29.7 pg (ref 26.0–34.0)
MCHC: 32.9 g/dL (ref 32.0–36.0)
MCV: 90 fL (ref 81–101)
MONO#: 0.4 10*3/uL (ref 0.1–0.9)
MONO%: 5.6 % (ref 0.0–13.0)
NEUT#: 5.1 10*3/uL (ref 1.5–6.5)
NEUT%: 67.7 % (ref 39.6–80.0)
PLATELETS: 199 10*3/uL (ref 145–400)
RBC: 4.78 10*6/uL (ref 3.70–5.32)
RDW: 13.5 % (ref 11.1–15.7)
WBC: 7.5 10*3/uL (ref 3.9–10.0)

## 2017-04-20 NOTE — Progress Notes (Signed)
Hematology and Oncology Follow Up Visit  Tamara Ball [id number] 1981/01/12 36 y.o. 04/20/2017   Principle Diagnosis:  Iron deficiency anemia secondary to menorrhagia   Current Therapy:   IV iron as needed - last received in March 2018   Interim History:  Tamara Ball is here today for follow-up. She is symptomatic with fatigue, tingling in her legs and lightheadedness.  She has had sinus congestion and drainage.  She is not happy with her current birth control. She has had quite a bit of breakthrough bleeding and has gained weight. She has scheduled a follow-up with her gynecologist next week.   No fever, chills, n/v, cough, rash, dizziness, SOB, chest pain, palpitations, abdominal pain or changes in bowel or bladder habits.  She takes a stool softener for constipation as needed.  No swelling or tenderness in her extremities. No c/o pain at this time.  She has a good appetite and is staying well hydrated. Her weight is stable.   ECOG Performance Status: 1 - Symptomatic but completely ambulatory  Medications:  Allergies as of 04/20/2017   No Known Allergies     Medication List        Accurate as of 04/20/17  3:42 PM. Always use your most recent med list.          acetaminophen 325 MG tablet Commonly known as:  TYLENOL Take 650 mg by mouth every 6 (six) hours as needed for mild pain.   AVIANE 0.1-20 MG-MCG tablet Generic drug:  levonorgestrel-ethinyl estradiol   cyclobenzaprine 5 MG tablet Commonly known as:  FLEXERIL   DYMISTA 137-50 MCG/ACT Susp Generic drug:  Azelastine-Fluticasone   ibuprofen 800 MG tablet Commonly known as:  ADVIL,MOTRIN Take 1 tablet (800 mg total) by mouth every 8 (eight) hours as needed.   levocetirizine 5 MG tablet Commonly known as:  XYZAL   montelukast 10 MG tablet Commonly known as:  SINGULAIR Take 1 tablet (10 mg total) by mouth at bedtime.       Allergies: No Known Allergies  Past Medical History, Surgical history, Social  history, and Family History were reviewed and updated.  Review of Systems: All other 10 point review of systems is negative.   Physical Exam:  weight is 207 lb (93.9 kg). Her oral temperature is 97.8 F (36.6 C). Her blood pressure is 117/59 (abnormal) and her pulse is 67. Her respiration is 18 and oxygen saturation is 99%.   Wt Readings from Last 3 Encounters:  04/20/17 207 lb (93.9 kg)  12/01/16 200 lb (90.7 kg)  10/16/16 190 lb (86.2 kg)    Ocular: Sclerae unicteric, pupils equal, round and reactive to light Ear-nose-throat: Oropharynx clear, dentition fair Lymphatic: No cervical, supraclavicular or axillary adenopathy Lungs no rales or rhonchi, good excursion bilaterally Heart regular rate and rhythm, no murmur appreciated Abd soft, nontender, positive bowel sounds, no liver or spleen tip palpated on exam, no fluid wave  MSK no focal spinal tenderness, no joint edema Neuro: non-focal, well-oriented, appropriate affect Breasts: Deferred   Lab Results  Component Value Date   WBC 7.5 04/20/2017   HGB 14.2 04/20/2017   HCT 43.2 04/20/2017   MCV 90 04/20/2017   PLT 199 04/20/2017   Lab Results  Component Value Date   FERRITIN 39 12/01/2016   IRON 117 12/01/2016   TIBC 378 12/01/2016   UIBC 261 12/01/2016   IRONPCTSAT 31 12/01/2016   Lab Results  Component Value Date   RETICCTPCT 1.4 05/16/2016   RBC 4.78 04/20/2017  RETICCTABS 64,400 05/16/2016   No results found for: KPAFRELGTCHN, LAMBDASER, KAPLAMBRATIO No results found for: IGGSERUM, IGA, IGMSERUM No results found for: Odetta Pink, SPEI   Chemistry      Component Value Date/Time   NA 140 10/16/2016 1853   NA 136 08/11/2016 1404   K 3.9 10/16/2016 1853   K 4.2 08/11/2016 1404   CL 107 10/16/2016 1853   CL 103 08/11/2016 1404   CO2 25 10/16/2016 1853   CO2 26 08/11/2016 1404   BUN 15 10/16/2016 1853   BUN 13 08/11/2016 1404   CREATININE 0.85  10/16/2016 1853   CREATININE 0.87 08/11/2016 1404      Component Value Date/Time   CALCIUM 9.1 10/16/2016 1853   CALCIUM 9.5 08/11/2016 1404   ALKPHOS 58 08/11/2016 1404   AST 9 08/11/2016 1404   ALT 8 08/11/2016 1404   BILITOT 0.5 08/11/2016 1404      Impression and Plan: Tamara Ball is a very pleasant 36 yo African American female with iron deficiency anemia secondary to heavy cycles. She is symptomatic at this time as mentioned above.  She is having breakthrough bleeding with her birth control and follows-up with her gynecologist next week to discuss a change in treatment.  We will see what her iron studies show and bring her back in next week for an infusion if needed.  She will contact our office with any questions or concerns. We can certainly see her sooner if need be.   [name], NP 12/14/20183:42 PM

## 2017-04-23 LAB — FERRITIN: Ferritin: 23 ng/ml (ref 9–269)

## 2017-04-23 LAB — IRON AND TIBC
%SAT: 23 % (ref 21–57)
IRON: 103 ug/dL (ref 41–142)
TIBC: 450 ug/dL — AB (ref 236–444)
UIBC: 347 ug/dL (ref 120–384)

## 2017-05-17 ENCOUNTER — Encounter: Payer: Self-pay | Admitting: Family Medicine

## 2017-05-19 NOTE — Progress Notes (Signed)
HPI:  Here for CPE:  -Concerns and/or follow up today:   Doing well for the most part.  No concerns today other than her weight.  She has recently started exercising and eating healthier in the last few weeks.  She has noticed her pants are fitting better, she is a little frustrated that the weight has not gone down faster.  She is working out daily with a Clinical research associate and is trying to eat less processed foods. She sees Dr. cousins for her women's health and recently had her Pap smear and breast exam done. -Taking folic acid, vitamin D or calcium: no -Diabetes and Dyslipidemia Screening: Fasting for labs. -Vaccines: see vaccine section Epic -wants her flu shot today -pap history: sees gyn, reports done in December 2018 and was normal with HPV negative -FDLMP: see nursing notes -sexual activity: yes -wants STI testing (Hep C if born [birth year]-65): no except does want an HIV test -FH breast, colon or ovarian ca: see FH Last mammogram: Not applicable Last colon cancer screening: Not applicable Breast Ca Risk Assessment: see family history and pt history DEXA (>/= 65): Not applicable  -Alcohol, Tobacco, drug use: see social history  Review of Systems - no fevers, unintentional weight loss, vision loss, hearing loss, chest pain, sob, hemoptysis, melena, hematochezia, hematuria, genital discharge, changing or concerning skin lesions, bleeding, bruising, loc, thoughts of self harm or SI  Past Medical History:  Diagnosis Date  . Chicken pox   . Chronic headaches   . Fibroids   . Iron deficiency anemia due to chronic blood loss    from heavy periods  . Menorrhagia     History reviewed. No pertinent surgical history.  Family History  Problem Relation Age of Onset  . Hypertension Mother   . Diabetes Maternal Grandfather   . Hypertension Maternal Uncle     Social History   Socioeconomic History  . Marital status: Single    Spouse name: None  . Number of children: None  . Years of  education: None  . Highest education level: None  Social Needs  . Financial resource strain: None  . Food insecurity - worry: None  . Food insecurity - inability: None  . Transportation needs - medical: None  . Transportation needs - non-medical: None  Occupational History  . None  Tobacco Use  . Smoking status: Never Smoker  . Smokeless tobacco: Never Used  Substance and Sexual Activity  . Alcohol use: Yes    Alcohol/week: 0.0 oz    Comment: social  . Drug use: No  . Sexual activity: None  Other Topics Concern  . 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  AVIANE 0.1-20 MG-MCG tablet, , Disp: , Rfl:  .  cyclobenzaprine (FLEXERIL) 5 MG tablet, , Disp: , Rfl:  .  DYMISTA 137-50 MCG/ACT SUSP, , Disp: , Rfl:  .  levocetirizine (XYZAL) 5 MG tablet, , Disp: , Rfl:  .  montelukast (SINGULAIR) 10 MG tablet, Take 1 tablet (10 mg total) by mouth at bedtime., Disp: 30 tablet, Rfl: 3 .  predniSONE (DELTASONE) 20 MG tablet, Two daily with food, Disp: 10 tablet, Rfl: 0  EXAM:  Vitals:   05/22/17 5597  BP: 122/80  Pulse: 75  Temp: 98.5 F (36.9 C)  Body mass index is 32.19 kg/m.   GENERAL: vitals reviewed and listed below, alert, oriented, appears well hydrated and in no acute distress  HEENT: head atraumatic, PERRLA, normal appearance of eyes, ears, nose and mouth. moist mucus membranes.  NECK: supple, no masses or lymphadenopathy  LUNGS: clear to auscultation bilaterally, no rales, rhonchi or wheeze  CV: HRRR, no peripheral edema or cyanosis, normal pedal pulses  ABDOMEN: bowel sounds normal, soft, non tender to palpation, no masses, no rebound or guarding  GU/BREAST: Declined, does with GYN  SKIN: no rash or abnormal lesions  MS: normal gait, moves  all extremities normally  NEURO: normal gait, speech and thought processing grossly intact, muscle tone grossly intact throughout  PSYCH: normal affect, pleasant and cooperative  ASSESSMENT AND PLAN:  Discussed the following assessment and plan:  PREVENTIVE EXAM: -Discussed and advised all [name] preventive services health task force level A and B recommendations for age, sex and risks. -Advised at least 150 minutes of exercise per week and a healthy diet with avoidance of (less then 1 serving per week) processed foods, white starches, red meat, fast foods and sweets and consisting of: * 5-9 servings of fresh fruits and vegetables (not corn or potatoes) *nuts and seeds, beans *olives and olive oil *lean meats such as fish and white chicken  *whole grains -labs, studies and vaccines per orders this encounter - Hemoglobin A1c - Lipid panel - HIV antibody  2. Screening for depression -Negative  3. BMI 32.0-32.9,adult -Discussed various treatment options -He has diet decided to work on a healthy diet and regular aerobic exercise  Patient advised to return to clinic immediately if symptoms worsen or persist or new concerns.  Patient Instructions  BEFORE YOU LEAVE: -flu shot -labs -follow up: yearly for physical  We have ordered labs or studies at this visit. It can take up to 1-2 weeks for results and processing. IF results require follow up or explanation, we will call you with instructions. Clinically stable results will be released to your [facility]. If you have not heard from [name] or cannot find your results in [facility] in 2 weeks please contact our office at [phone].  If you are not yet signed up for [facility], please consider signing up.   We recommend the following healthy lifestyle for LIFE: 1) Small portions. But, make sure to get regular (at least 3 per day), healthy meals and small healthy snacks if needed.  2) Eat a healthy clean diet.   TRY TO EAT: -at least 5-7  servings of low sugar, colorful, and nutrient rich vegetables per day (not corn, potatoes or bananas.) -berries are the best choice if you wish to eat fruit (only eat small amounts if trying to reduce weight)  -lean meets (fish, white meat of chicken or [place]) -vegan proteins for some meals - beans or tofu, whole grains, nuts and seeds -Replace bad fats with good fats - good fats include: fish, nuts and seeds, canola oil, olive oil -small amounts of low fat or non fat dairy -small amounts of100 % whole grains - check the lables -drink plenty of water  AVOID: -SUGAR, sweets, anything with added sugar, corn syrup or sweeteners - must read labels as even foods advertised as "healthy" often are loaded with sugar -if you must have a sweetener, small amounts of stevia may be best -sweetened beverages and artificially sweetened beverages -simple starches (rice, bread, potatoes, pasta, chips, etc -  small amounts of 100% whole grains are ok) -red meat, pork, butter -fried foods, fast food, processed food, excessive dairy, eggs and coconut.  3)Get at least 150 minutes of sweaty aerobic exercise per week.  4)Reduce stress - consider counseling, meditation and relaxation to balance other aspects of your life.    Preventive Care 18-39 Years, Female Preventive care refers to lifestyle choices and visits with your health care provider that can promote health and wellness. What does preventive care include?  A yearly physical exam. This is also called an annual well check.  Dental exams once or twice a year.  Routine eye exams. Ask your health care provider how often you should have your eyes checked.  Personal lifestyle choices, including: ? Daily care of your teeth and gums. ? Regular physical activity. ? Eating a healthy diet. ? Avoiding tobacco and drug use. ? Limiting alcohol use. ? Practicing safe sex. ? Taking vitamin and mineral supplements as recommended by your health care  provider. What happens during an annual well check? The services and screenings done by your health care provider during your annual well check will depend on your age, overall health, lifestyle risk factors, and family history of disease. Counseling Your health care provider may ask you questions about your:  Alcohol use.  Tobacco use.  Drug use.  Emotional well-being.  Home and relationship well-being.  Sexual activity.  Eating habits.  Work and work Statistician.  Method of birth control.  Menstrual cycle.  Pregnancy history.  Screening You may have the following tests or measurements:  Height, weight, and BMI.  Diabetes screening. This is done by checking your blood sugar (glucose) after you have not eaten for a while (fasting).  Blood pressure.  Lipid and cholesterol levels. These may be checked every 5 years starting at age 38.  Skin check.  Hepatitis C blood test.  Hepatitis B blood test.  Sexually transmitted disease (STD) testing.  BRCA-related cancer screening. This may be done if you have a family history of breast, ovarian, tubal, or peritoneal cancers.  Pelvic exam and Pap test. This may be done every 3 years starting at age 73. Starting at age 87, this may be done every 5 years if you have a Pap test in combination with an HPV test.  Discuss your test results, treatment options, and if necessary, the need for more tests with your health care provider. Vaccines Your health care provider may recommend certain vaccines, such as:  Influenza vaccine. This is recommended every year.  Tetanus, diphtheria, and acellular pertussis (Tdap, Td) vaccine. You may need a Td booster every 10 years.  Varicella vaccine. You may need this if you have not been vaccinated.  HPV vaccine. If you are 18 or younger, you may need three doses over 6 months.  Measles, mumps, and rubella (MMR) vaccine. You may need at least one dose of MMR. You may also need a second  dose.  Pneumococcal 13-valent conjugate (PCV13) vaccine. You may need this if you have certain conditions and were not previously vaccinated.  Pneumococcal polysaccharide (PPSV23) vaccine. You may need one or two doses if you smoke cigarettes or if you have certain conditions.  Meningococcal vaccine. One dose is recommended if you are age 80-21 years and a first-year college student living in a residence hall, or if you have one of several medical conditions. You may also need additional booster doses.  Hepatitis A vaccine. You may need this if you have certain conditions or if  you travel or work in places where you may be exposed to hepatitis A.  Hepatitis B vaccine. You may need this if you have certain conditions or if you travel or work in places where you may be exposed to hepatitis B.  Haemophilus influenzae type b (Hib) vaccine. You may need this if you have certain risk factors.  Talk to your health care provider about which screenings and vaccines you need and how often you need them. This information is not intended to replace advice given to you by your health care provider. Make sure you discuss any questions you have with your health care provider. Document Released: 06/20/2001 Document Revised: 01/12/2016 Document Reviewed: 02/23/2015 Elsevier Interactive Patient Education  2018 [employer].        No Follow-up on file.  Colin Benton R., DO

## 2017-05-20 ENCOUNTER — Encounter (HOSPITAL_COMMUNITY): Payer: Self-pay | Admitting: *Deleted

## 2017-05-20 ENCOUNTER — Ambulatory Visit (HOSPITAL_COMMUNITY)
Admission: EM | Admit: 2017-05-20 | Discharge: 2017-05-20 | Disposition: A | Discharge status: Home / Self Care | Payer: BC Managed Care – PPO | Attending: Family Medicine | Admitting: Family Medicine

## 2017-05-20 ENCOUNTER — Other Ambulatory Visit: Payer: Self-pay

## 2017-05-20 ENCOUNTER — Ambulatory Visit (INDEPENDENT_AMBULATORY_CARE_PROVIDER_SITE_OTHER): Payer: BC Managed Care – PPO

## 2017-05-20 DIAGNOSIS — M19032 Primary osteoarthritis, left wrist: Secondary | ICD-10-CM

## 2017-05-20 DIAGNOSIS — M25532 Pain in left wrist: Secondary | ICD-10-CM

## 2017-05-20 MED ORDER — PREDNISONE 20 MG PO TABS: ORAL_TABLET | ORAL | 0 refills | Status: DC

## 2017-05-20 NOTE — Discharge Instructions (Signed)
You should be much better tomorrow.  Otherwise, call Dr. Burney Gauze

## 2017-05-20 NOTE — ED Triage Notes (Addendum)
Per pt left swollen wrist, per pt she woke up with it swollen and don't know what may have happened. Per pt she can not move her wrist and if she tries to it feels like a 10 on the pain scale.

## 2017-05-20 NOTE — ED Provider Notes (Signed)
[place]   [id number] 05/20/17 Arrival Time: 7341   SUBJECTIVE:  Tamara Ball is a 37 y.o. female who presents to the urgent care with complaint of left swollen wrist, per pt she woke up with it swollen and don't know what may have happened. Per pt she can not move her wrist and if she tries to it feels like a 10 on the pain scale.   The problem started spontaneously several days ago and it is gotten progressively worse.  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  Patient has no family history of arthritis.  She has not had any other joint problems, skin rash, or eye irritation.  Past Medical History:  Diagnosis Date  . Chicken pox   . Chronic headaches   . Fibroids   . Iron deficiency anemia due to chronic blood loss    from heavy periods  . Menorrhagia    Family History  Problem Relation Age of Onset  . Hypertension Mother   . Diabetes Maternal Grandfather   . Hypertension Maternal Uncle    Social History   Socioeconomic History  . Marital status: Single    Spouse name: Not on file  . Number of children: Not on file  . Years of education: Not on file  . Highest education level: Not on file  Social Needs  . Financial resource strain: Not on file  . Food insecurity - worry: Not on file  . Food insecurity - inability: Not on file  . Transportation needs - medical: Not on file  . Transportation needs - non-medical: Not on file  Occupational History  . Not on file  Tobacco Use  . Smoking status: Never Smoker  . Smokeless tobacco: Never Used  Substance and Sexual Activity  . Alcohol use: Yes    Alcohol/week: 0.0 oz    Comment: social  . Drug use: No  . Sexual activity: Not on file  Other Topics Concern  . 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   No Known Allergies    ROS: As per HPI, remainder of ROS negative.   OBJECTIVE:   Vitals:   05/20/17 1520  BP: (!) 146/86  Pulse: 76  Resp: 18  Temp: 98.5 F (36.9 C)  TempSrc: Oral  SpO2: 100%     General appearance: alert; no distress Eyes: PERRL; EOMI; conjunctiva normal HENT: normocephalic; atraumatic; , external ears normal without trauma; nasal mucosa normal; oral mucosa normal Neck: supple Back: no CVA tenderness Extremities: no cyanosis or edema; swollen left radial wrist with effusion, tenderness over navicular. Skin: warm and dry Neurologic: normal gait; grossly normal Psychological: alert and cooperative; normal mood and affect      Labs:  Results for orders placed or performed in visit on 04/20/17  CBC w/Diff  Result Value Ref Range   WBC 7.5 3.9 - 10.0 10e3/uL   RBC 4.78 3.70 - 5.32 10e6/uL   HGB 14.2 11.6 - 15.9 g/dL   HCT 43.2 34.8 - 46.6 %   MCV 90 81 - 101 fL   MCH 29.7 26.0 -  34.0 pg   MCHC 32.9 32.0 - 36.0 g/dL   RDW 13.5 11.1 - 15.7 %   Platelets 199 145 - 400 10e3/uL   NEUT# 5.1 1.5 - 6.5 10e3/uL   LYMPH# 1.6 0.9 - 3.3 10e3/uL   MONO# 0.4 0.1 - 0.9 10e3/uL   Eosinophils Absolute 0.4 0.0 - 0.5 10e3/uL   BASO# 0.1 0.0 - 0.2 10e3/uL   NEUT% 67.7 39.6 - 80.0 %   LYMPH% 20.9 14.0 - 48.0 %   MONO% 5.6 0.0 - 13.0 %   EOS% 5.1 0.0 - 7.0 %   BASO% 0.7 0.0 - 2.0 %  Ferritin  Result Value Ref Range   Ferritin 23 9 - 269 ng/ml  Iron and TIBC  Result Value Ref Range   Iron 103 41 - 142 ug/dL   TIBC 450 (H) 236 - 444 ug/dL   UIBC 347 120 - 384 ug/dL   %SAT 23 21 - 57 %    Labs Reviewed - No data to display  Dg Wrist Complete Left  Result Date: 05/20/2017 CLINICAL DATA:  Left wrist pain for 2 days. EXAM: LEFT WRIST - COMPLETE 3+ VIEW COMPARISON:  None. FINDINGS: There is no evidence of fracture or dislocation. There is no  evidence of arthropathy or other focal bone abnormality. Soft tissues are unremarkable. IMPRESSION: Negative. Electronically Signed   By: Abelardo Diesel M.D.   On: 05/20/2017 15:42       ASSESSMENT & PLAN:  1. Arthritis of wrist, left     Meds ordered this encounter  Medications  . predniSONE (DELTASONE) 20 MG tablet    Sig: Two daily with food    Dispense:  10 tablet    Refill:  0    Reviewed expectations re: course of current medical issues. Questions answered. Outlined signs and symptoms indicating need for more acute intervention. Patient verbalized understanding. After Visit Summary given.    Procedures:      Tamara Haber, MD 05/20/17 1610

## 2017-05-22 ENCOUNTER — Encounter: Payer: Self-pay | Admitting: Family Medicine

## 2017-05-22 ENCOUNTER — Ambulatory Visit (INDEPENDENT_AMBULATORY_CARE_PROVIDER_SITE_OTHER): Payer: BC Managed Care – PPO | Admitting: Family Medicine

## 2017-05-22 VITALS — BP 122/80 | HR 75 | Temp 98.5°F | Ht 66.75 in | Wt 204.0 lb

## 2017-05-22 DIAGNOSIS — Z23 Encounter for immunization: Secondary | ICD-10-CM | POA: Diagnosis not present

## 2017-05-22 DIAGNOSIS — Z6832 Body mass index (BMI) 32.0-32.9, adult: Secondary | ICD-10-CM

## 2017-05-22 DIAGNOSIS — Z1331 Encounter for screening for depression: Secondary | ICD-10-CM | POA: Diagnosis not present

## 2017-05-22 DIAGNOSIS — Z0000 Encounter for general adult medical examination without abnormal findings: Secondary | ICD-10-CM | POA: Diagnosis not present

## 2017-05-22 LAB — LIPID PANEL
CHOL/HDL RATIO: 2
Cholesterol: 114 mg/dL (ref 0–200)
HDL: 48 mg/dL (ref >39.00)
LDL Cholesterol: 51 mg/dL (ref 0–99)
NONHDL: 66.01
Triglycerides: 74 mg/dL (ref 0.0–149.0)
VLDL: 14.8 mg/dL (ref 0.0–40.0)

## 2017-05-22 LAB — HEMOGLOBIN A1C: HEMOGLOBIN A1C: 5.1 % (ref 4.6–6.5)

## 2017-05-22 NOTE — Patient Instructions (Signed)
BEFORE YOU LEAVE: -flu shot -labs -follow up: yearly for physical  We have ordered labs or studies at this visit. It can take up to 1-2 weeks for results and processing. IF results require follow up or explanation, we will call you with instructions. Clinically stable results will be released to your [facility]. If you have not heard from [name] or cannot find your results in [facility] in 2 weeks please contact our office at [phone].  If you are not yet signed up for [facility], please consider signing up.   We recommend the following healthy lifestyle for LIFE: 1) Small portions. But, make sure to get regular (at least 3 per day), healthy meals and small healthy snacks if needed.  2) Eat a healthy clean diet.   TRY TO EAT: -at least 5-7 servings of low sugar, colorful, and nutrient rich vegetables per day (not corn, potatoes or bananas.) -berries are the best choice if you wish to eat fruit (only eat small amounts if trying to reduce weight)  -lean meets (fish, white meat of chicken or [place]) -vegan proteins for some meals - beans or tofu, whole grains, nuts and seeds -Replace bad fats with good fats - good fats include: fish, nuts and seeds, canola oil, olive oil -small amounts of low fat or non fat dairy -small amounts of100 % whole grains - check the lables -drink plenty of water  AVOID: -SUGAR, sweets, anything with added sugar, corn syrup or sweeteners - must read labels as even foods advertised as "healthy" often are loaded with sugar -if you must have a sweetener, small amounts of stevia may be best -sweetened beverages and artificially sweetened beverages -simple starches (rice, bread, potatoes, pasta, chips, etc - small amounts of 100% whole grains are ok) -red meat, pork, butter -fried foods, fast food, processed food, excessive dairy, eggs and coconut.  3)Get at least 150 minutes of sweaty aerobic exercise per week.  4)Reduce stress - consider counseling, meditation and  relaxation to balance other aspects of your life.    Preventive Care 18-39 Years, Female Preventive care refers to lifestyle choices and visits with your health care provider that can promote health and wellness. What does preventive care include?  A yearly physical exam. This is also called an annual well check.  Dental exams once or twice a year.  Routine eye exams. Ask your health care provider how often you should have your eyes checked.  Personal lifestyle choices, including: ? Daily care of your teeth and gums. ? Regular physical activity. ? Eating a healthy diet. ? Avoiding tobacco and drug use. ? Limiting alcohol use. ? Practicing safe sex. ? Taking vitamin and mineral supplements as recommended by your health care provider. What happens during an annual well check? The services and screenings done by your health care provider during your annual well check will depend on your age, overall health, lifestyle risk factors, and family history of disease. Counseling Your health care provider may ask you questions about your:  Alcohol use.  Tobacco use.  Drug use.  Emotional well-being.  Home and relationship well-being.  Sexual activity.  Eating habits.  Work and work Statistician.  Method of birth control.  Menstrual cycle.  Pregnancy history.  Screening You may have the following tests or measurements:  Height, weight, and BMI.  Diabetes screening. This is done by checking your blood sugar (glucose) after you have not eaten for a while (fasting).  Blood pressure.  Lipid and cholesterol levels. These may be checked  every 5 years starting at age 3.  Skin check.  Hepatitis C blood test.  Hepatitis B blood test.  Sexually transmitted disease (STD) testing.  BRCA-related cancer screening. This may be done if you have a family history of breast, ovarian, tubal, or peritoneal cancers.  Pelvic exam and Pap test. This may be done every 3 years  starting at age 40. Starting at age 1, this may be done every 5 years if you have a Pap test in combination with an HPV test.  Discuss your test results, treatment options, and if necessary, the need for more tests with your health care provider. Vaccines Your health care provider may recommend certain vaccines, such as:  Influenza vaccine. This is recommended every year.  Tetanus, diphtheria, and acellular pertussis (Tdap, Td) vaccine. You may need a Td booster every 10 years.  Varicella vaccine. You may need this if you have not been vaccinated.  HPV vaccine. If you are 8 or younger, you may need three doses over 6 months.  Measles, mumps, and rubella (MMR) vaccine. You may need at least one dose of MMR. You may also need a second dose.  Pneumococcal 13-valent conjugate (PCV13) vaccine. You may need this if you have certain conditions and were not previously vaccinated.  Pneumococcal polysaccharide (PPSV23) vaccine. You may need one or two doses if you smoke cigarettes or if you have certain conditions.  Meningococcal vaccine. One dose is recommended if you are age 62-21 years and a first-year college student living in a residence hall, or if you have one of several medical conditions. You may also need additional booster doses.  Hepatitis A vaccine. You may need this if you have certain conditions or if you travel or work in places where you may be exposed to hepatitis A.  Hepatitis B vaccine. You may need this if you have certain conditions or if you travel or work in places where you may be exposed to hepatitis B.  Haemophilus influenzae type b (Hib) vaccine. You may need this if you have certain risk factors.  Talk to your health care provider about which screenings and vaccines you need and how often you need them. This information is not intended to replace advice given to you by your health care provider. Make sure you discuss any questions you have with your health care  provider. Document Released: 06/20/2001 Document Revised: 01/12/2016 Document Reviewed: 02/23/2015 Elsevier Interactive Patient Education  [employer].

## 2017-05-22 NOTE — Addendum Note (Signed)
Addended by: Agnes Lawrence on: 05/22/2017 10:05 AM   Modules accepted: Orders

## 2017-05-23 LAB — HIV ANTIBODY (ROUTINE TESTING W REFLEX): HIV 1&2 Ab, 4th Generation: NONREACTIVE

## 2017-06-07 ENCOUNTER — Encounter: Payer: Self-pay | Admitting: Family Medicine

## 2017-06-07 ENCOUNTER — Ambulatory Visit: Payer: BC Managed Care – PPO | Admitting: Family Medicine

## 2017-06-07 VITALS — BP 96/70 | HR 75 | Temp 97.8°F | Ht 66.75 in | Wt 209.4 lb

## 2017-06-07 DIAGNOSIS — R6889 Other general symptoms and signs: Secondary | ICD-10-CM

## 2017-06-07 LAB — POC INFLUENZA A&B (BINAX/QUICKVUE)
Influenza A, POC: NEGATIVE
Influenza B, POC: NEGATIVE

## 2017-06-07 NOTE — Patient Instructions (Addendum)
INSTRUCTIONS FOR UPPER RESPIRATORY INFECTION or flu:  -plenty of rest and fluids  -nasal saline wash 2-3 times daily (use prepackaged nasal saline or bottled/distilled water if making your own)   -can use AFRIN nasal spray for drainage and nasal congestion - but do NOT use longer then 3-4 days  -can use tylenol (in no history of liver disease) or ibuprofen (if no history of kidney disease, bowel bleeding or significant heart disease) as directed for aches and sorethroat  -in the winter time, using a humidifier at night is helpful (please follow cleaning instructions)  -if you are taking a cough medication - use only as directed, may also try a teaspoon of honey to coat the throat and throat lozenges.   -for sore throat, salt water gargles can help  -follow up if you have fevers, facial pain, tooth pain, difficulty breathing or are worsening or symptoms persist longer then expected  Upper Respiratory Infection, Adult An upper respiratory infection (URI) is also known as the common cold. It is often caused by a type of germ (virus). Colds are easily spread (contagious). You can pass it to others by kissing, coughing, sneezing, or drinking out of the same glass. Usually, you get better in 1 to 3  weeks.  However, the cough can last for even longer. HOME CARE   Only take medicine as told by your doctor. Follow instructions provided above.  Drink enough water and fluids to keep your pee (urine) clear or pale yellow.  Get plenty of rest.  Return to work when your temperature is < 100 for 24 hours or as told by your doctor. You may use a face mask and wash your hands to stop your cold from spreading. GET HELP RIGHT AWAY IF:   After the first few days, you feel you are getting worse.  You have questions about your medicine.  You have chills, shortness of breath, or red spit (mucus).  You have pain in the face for more then 1-2 days, especially when you bend forward.  You have a fever,  puffy (swollen) neck, pain when you swallow, or white spots in the back of your throat.  You have a bad headache, ear pain, sinus pain, or chest pain.  You have a high-pitched whistling sound when you breathe in and out (wheezing).  You cough up blood.  You have sore muscles or a stiff neck. MAKE SURE YOU:   Understand these instructions.  Will watch your condition.  Will get help right away if you are not doing well or get worse. Document Released: 10/11/2007 Document Revised: 07/17/2011 Document Reviewed: 07/30/2013 [facility] Patient Information 2015 [place], [place]. This information is not intended to replace advice given to you by your health care provider. Make sure you discuss any questions you have with your health care provider.

## 2017-06-07 NOTE — Progress Notes (Signed)
HPI:  Acute visit for respiratory illness: -started:5 days ago -symptoms:nasal congestion, sore throat, cough, body aches, sub fever -denies:fever, SOB, NVD, tooth pain -has tried: otc cough and cold medications -sick contacts/travel/risks: no reported flu, strep or tick exposure  ROS: See pertinent positives and negatives per HPI.  Past Medical History:  Diagnosis Date  . Chicken pox   . Chronic headaches   . Fibroids   . Iron deficiency anemia due to chronic blood loss    from heavy periods  . Menorrhagia     History reviewed. No pertinent surgical history.  Family History  Problem Relation Age of Onset  . Hypertension Mother   . Diabetes Maternal Grandfather   . Hypertension Maternal Uncle     Social History   Socioeconomic History  . Marital status: Single    Spouse name: None  . Number of children: None  . Years of education: None  . Highest education level: None  Social Needs  . Financial resource strain: None  . Food insecurity - worry: None  . Food insecurity - inability: None  . Transportation needs - medical: None  . Transportation needs - non-medical: None  Occupational History  . None  Tobacco Use  . Smoking status: Never Smoker  . Smokeless tobacco: Never Used  Substance and Sexual Activity  . Alcohol use: Yes    Alcohol/week: 0.0 oz    Comment: social  . Drug use: No  . Sexual activity: None  Other Topics Concern  . 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  AVIANE 0.1-20 MG-MCG tablet, , Disp: , Rfl:  .  cyclobenzaprine (FLEXERIL) 5 MG tablet, , Disp: , Rfl:  .  DYMISTA 137-50 MCG/ACT SUSP, , Disp: , Rfl:  .  levocetirizine (XYZAL) 5 MG tablet, , Disp: , Rfl:  .  montelukast (SINGULAIR) 10 MG tablet, Take 1 tablet  (10 mg total) by mouth at bedtime., Disp: 30 tablet, Rfl: 3  EXAM:  Vitals:   06/07/17 1324  BP: 96/70  Pulse: 75  Temp: 97.8 F (36.6 C)  SpO2: 99%    Body mass index is 33.04 kg/m.  GENERAL: vitals reviewed and listed above, alert, oriented, appears well hydrated and in no acute distress  HEENT: atraumatic, conjunttiva clear, no obvious abnormalities on inspection of external nose and ears, normal appearance of ear canals and TMs, lots of clear nasal congestion, mild post oropharyngeal erythema with PND, no tonsillar edema or exudate, no sinus TTP  NECK: no obvious masses on inspection  LUNGS: clear to auscultation bilaterally, no wheezes, rales or rhonchi, good air movement  CV: HRRR  MS: moves all extremities without noticeable abnormality  PSYCH: pleasant and cooperative, no obvious depression or anxiety  ASSESSMENT AND PLAN:  Discussed the following assessment and plan:  Influenza-like symptoms - Plan: POC Influenza A&B(BINAX/QUICKVUE)  We discussed potential/likely etiologies, with influenza vs. Viral upper resp infection most likely vs. other. We discussed risks/benefits/indications/best timing for tamiflu, symptomatic care, likely course, transmission, potential complications, signs of developing a serious illness and return and emergency precuations. -opted against tamiflu given duration of symptoms and mild findings today -of course, we advised to return or notify a doctor immediately  if symptoms worsen or persist or new concerns arise.    Patient Instructions  INSTRUCTIONS FOR UPPER RESPIRATORY INFECTION or flu:  -plenty of rest and fluids  -nasal saline wash 2-3 times daily (use prepackaged nasal saline or bottled/distilled water if making your own)   -can use AFRIN nasal spray for drainage and nasal congestion - but do NOT use longer then 3-4 days  -can use tylenol (in no history of liver disease) or ibuprofen (if no history of kidney disease, bowel  bleeding or significant heart disease) as directed for aches and sorethroat  -in the winter time, using a humidifier at night is helpful (please follow cleaning instructions)  -if you are taking a cough medication - use only as directed, may also try a teaspoon of honey to coat the throat and throat lozenges.   -for sore throat, salt water gargles can help  -follow up if you have fevers, facial pain, tooth pain, difficulty breathing or are worsening or symptoms persist longer then expected  Upper Respiratory Infection, Adult An upper respiratory infection (URI) is also known as the common cold. It is often caused by a type of germ (virus). Colds are easily spread (contagious). You can pass it to others by kissing, coughing, sneezing, or drinking out of the same glass. Usually, you get better in 1 to 3  weeks.  However, the cough can last for even longer. HOME CARE   Only take medicine as told by your doctor. Follow instructions provided above.  Drink enough water and fluids to keep your pee (urine) clear or pale yellow.  Get plenty of rest.  Return to work when your temperature is < 100 for 24 hours or as told by your doctor. You may use a face mask and wash your hands to stop your cold from spreading. GET HELP RIGHT AWAY IF:   After the first few days, you feel you are getting worse.  You have questions about your medicine.  You have chills, shortness of breath, or red spit (mucus).  You have pain in the face for more then 1-2 days, especially when you bend forward.  You have a fever, puffy (swollen) neck, pain when you swallow, or white spots in the back of your throat.  You have a bad headache, ear pain, sinus pain, or chest pain.  You have a high-pitched whistling sound when you breathe in and out (wheezing).  You cough up blood.  You have sore muscles or a stiff neck. MAKE SURE YOU:   Understand these instructions.  Will watch your condition.  Will get help right  away if you are not doing well or get worse. Document Released: 10/11/2007 Document Revised: 07/17/2011 Document Reviewed: 07/30/2013 [facility] Patient Information 2015 [place], [place]. This information is not intended to replace advice given to you by your health care provider. Make sure you discuss any questions you have with your health care provider.     [name], DO

## 2017-07-26 ENCOUNTER — Other Ambulatory Visit: Payer: BC Managed Care – PPO

## 2017-07-26 ENCOUNTER — Ambulatory Visit: Payer: BC Managed Care – PPO | Admitting: Family Medicine

## 2017-07-26 ENCOUNTER — Ambulatory Visit: Payer: BC Managed Care – PPO | Admitting: Family

## 2017-07-26 ENCOUNTER — Encounter: Payer: Self-pay | Admitting: Family Medicine

## 2017-07-26 VITALS — BP 100/68 | HR 74 | Temp 97.9°F | Ht 66.75 in | Wt 205.9 lb

## 2017-07-26 DIAGNOSIS — K5900 Constipation, unspecified: Secondary | ICD-10-CM | POA: Diagnosis not present

## 2017-07-26 DIAGNOSIS — K625 Hemorrhage of anus and rectum: Secondary | ICD-10-CM | POA: Diagnosis not present

## 2017-07-26 MED ORDER — HYDROCORTISONE 2.5 % RE CREA: 1.0000 "application " | TOPICAL_CREAM | Freq: Two times a day (BID) | RECTAL | 0 refills | Status: DC

## 2017-07-26 NOTE — Patient Instructions (Signed)
Metamucil or Benefiber every morning before breakfast  MiraLAX once daily at the first signs of becoming constipated.  Take for 3-7 days until you are having daily soft bowel movements.  Anusol if needed along with sits baths of pain or hemorrhoids.  Follow-up for recurrent bleeding, problems do not resolve with treatment above or other concerns.

## 2017-07-26 NOTE — Progress Notes (Signed)
  HPI:  Using dictation device. Unfortunately this device frequently misinterprets words/phrases.  Acute visit for BRBPR: -chronically constipated, fear of going in public -had straining and hard stool last week and rectal pain with BM, saw streak of blood on TP/stool -no bleeding in several day, no blood in toilet, no diarrhea/mucus, abd pain  ROS: See pertinent positives and negatives per HPI.  Past Medical History:  Diagnosis Date  . Chicken pox   . Chronic headaches   . Fibroids   . Iron deficiency anemia due to chronic blood loss    from heavy periods  . Menorrhagia     History reviewed. No pertinent surgical history.  Family History  Problem Relation Age of Onset  . Hypertension Mother   . Diabetes Maternal Grandfather   . Hypertension Maternal Uncle     SOCIAL HX: No reported changes   Current Outpatient Medications:  .  AVIANE 0.1-20 MG-MCG tablet, , Disp: , Rfl:  .  DYMISTA 137-50 MCG/ACT SUSP, , Disp: , Rfl:  .  levocetirizine (XYZAL) 5 MG tablet, , Disp: , Rfl:  .  montelukast (SINGULAIR) 10 MG tablet, Take 1 tablet (10 mg total) by mouth at bedtime., Disp: 30 tablet, Rfl: 3 .  hydrocortisone (ANUSOL-HC) 2.5 % rectal cream, Place 1 application rectally 2 (two) times daily., Disp: 30 g, Rfl: 0  EXAM:  Vitals:   07/26/17 1610  BP: 100/68  Pulse: 74  Temp: 97.9 F (36.6 C)    Body mass index is 32.49 kg/m.  GENERAL: vitals reviewed and listed above, alert, oriented, appears well hydrated and in no acute distress  HEENT: atraumatic, conjunttiva clear, no obvious abnormalities on inspection of external nose and ears  NECK: no obvious masses on inspection  ABD: soft, NTTP  RECTUM: soft, small hemorrhoid x1  MS: moves all extremities without noticeable abnormality  PSYCH: pleasant and cooperative, no obvious depression or anxiety  ASSESSMENT AND PLAN:  Discussed the following assessment and plan:  Constipation, unspecified constipation  type  BRBPR (bright red blood per rectum)  -Discussed potential etiologies with hemorrhoid or tear most likely secondary to constipation -Finally agreed to let us take a look today  - no tear on exam today, small soft hemorrhoid -Treat with bowel regimen and topical treatments as needed -Advised follow-up if recurrent bleeding, problems despite treatment -Patient advised to return or notify a doctor immediately if symptoms worsen or persist or new concerns arise.  Patient Instructions  Metamucil or Benefiber every morning before breakfast  MiraLAX once daily at the first signs of becoming constipated.  Take for 3-7 days until you are having daily soft bowel movements.  Anusol if needed along with sits baths of pain or hemorrhoids.  Follow-up for recurrent bleeding, problems do not resolve with treatment above or other concerns.   [name], DO

## 2017-08-08 ENCOUNTER — Inpatient Hospital Stay: Payer: BC Managed Care – PPO | Admitting: Family

## 2017-08-08 ENCOUNTER — Inpatient Hospital Stay: Payer: BC Managed Care – PPO

## 2017-10-30 ENCOUNTER — Inpatient Hospital Stay: Payer: BC Managed Care – PPO

## 2017-10-30 ENCOUNTER — Other Ambulatory Visit: Payer: Self-pay

## 2017-10-30 ENCOUNTER — Inpatient Hospital Stay: Payer: BC Managed Care – PPO | Attending: Family | Admitting: Family

## 2017-10-30 VITALS — BP 132/77 | HR 74 | Temp 98.4°F | Resp 20 | Wt 202.0 lb

## 2017-10-30 DIAGNOSIS — D500 Iron deficiency anemia secondary to blood loss (chronic): Secondary | ICD-10-CM

## 2017-10-30 DIAGNOSIS — N920 Excessive and frequent menstruation with regular cycle: Secondary | ICD-10-CM | POA: Diagnosis not present

## 2017-10-30 LAB — CBC WITH DIFFERENTIAL (CANCER CENTER ONLY)
BASOS PCT: 1 %
Basophils Absolute: 0 10*3/uL (ref 0.0–0.1)
EOS ABS: 0.4 10*3/uL (ref 0.0–0.5)
EOS PCT: 6 %
HCT: 40.6 % (ref 34.8–46.6)
Hemoglobin: 13.2 g/dL (ref 11.6–15.9)
LYMPHS ABS: 1.5 10*3/uL (ref 0.9–3.3)
Lymphocytes Relative: 22 %
MCH: 28.3 pg (ref 26.0–34.0)
MCHC: 32.5 g/dL (ref 32.0–36.0)
MCV: 86.9 fL (ref 81.0–101.0)
Monocytes Absolute: 0.4 10*3/uL (ref 0.1–0.9)
Monocytes Relative: 6 %
Neutro Abs: 4.5 10*3/uL (ref 1.5–6.5)
Neutrophils Relative %: 65 %
Platelet Count: 220 10*3/uL (ref 145–400)
RBC: 4.67 MIL/uL (ref 3.70–5.32)
RDW: 13.5 % (ref 11.1–15.7)
WBC: 6.9 10*3/uL (ref 3.9–10.0)

## 2017-10-30 NOTE — Progress Notes (Signed)
Hematology and Oncology Follow Up Visit  Tamara Ball [id number] [birth year] 37 y.o. 10/30/2017   Principle Diagnosis:  Iron deficiency anemia secondary to menorrhagia  Current Therapy:   IV iron as needed - last received in March 2018   Interim History:  Tamara Ball is here today for follow-up. She is symptomatic with fatigue needing to nap throughout the day, increased headaches, occasional lightheadedness.  Her cycle is regular and flow varies from month to month. No other bleeding, no bruising or petechiae.  She is working 4 10 hour shifts a week and has to get up and hour earlier each day.  No fever, chills, chewing ice, n/v, cough, rash, SOB, chest pain, palpitations, abdominal pain or changes in bowel or bladder habits.  No swelling, tenderness, numbness or tingling in her extremities.  She has maintained a good appetite and is staying well hydrated. Her weight is stable.  She is staying active and doing a high intensity work out 3 days a week.   ECOG Performance Status: 1 - Symptomatic but completely ambulatory  Medications:  Allergies as of 10/30/2017   No Known Allergies     Medication List        Accurate as of 10/30/17  3:29 PM. Always use your most recent med list.          AVIANE 0.1-20 MG-MCG tablet Generic drug:  levonorgestrel-ethinyl estradiol   DYMISTA 137-50 MCG/ACT Susp Generic drug:  Azelastine-Fluticasone   hydrocortisone 2.5 % rectal cream Commonly known as:  ANUSOL-HC Place 1 application rectally 2 (two) times daily.   levocetirizine 5 MG tablet Commonly known as:  XYZAL   montelukast 10 MG tablet Commonly known as:  SINGULAIR Take 1 tablet (10 mg total) by mouth at bedtime.       Allergies: No Known Allergies  Past Medical History, Surgical history, Social history, and Family History were reviewed and updated.  Review of Systems: All other 10 point review of systems is negative.   Physical Exam:  vitals were not taken for this  visit.   Wt Readings from Last 3 Encounters:  07/26/17 205 lb 14.4 oz (93.4 kg)  06/07/17 209 lb 6.4 oz (95 kg)  05/22/17 204 lb (92.5 kg)    Ocular: Sclerae unicteric, pupils equal, round and reactive to light Ear-nose-throat: Oropharynx clear, dentition fair Lymphatic: No cervical, supraclavicular or axillary adenopathy Lungs no rales or rhonchi, good excursion bilaterally Heart regular rate and rhythm, no murmur appreciated Abd soft, nontender, positive bowel sounds, no liver or spleen tip palpated on exam, no fluid wave  MSK no focal spinal tenderness, no joint edema Neuro: non-focal, well-oriented, appropriate affect Breasts: Deferred   Lab Results  Component Value Date   WBC 6.9 10/30/2017   HGB 13.2 10/30/2017   HCT 40.6 10/30/2017   MCV 86.9 10/30/2017   PLT 220 10/30/2017   Lab Results  Component Value Date   FERRITIN 23 04/20/2017   IRON 103 04/20/2017   TIBC 450 (H) 04/20/2017   UIBC 347 04/20/2017   IRONPCTSAT 23 04/20/2017   Lab Results  Component Value Date   RETICCTPCT 1.4 05/16/2016   RBC 4.67 10/30/2017   RETICCTABS 64,400 05/16/2016   No results found for: KPAFRELGTCHN, LAMBDASER, KAPLAMBRATIO No results found for: IGGSERUM, IGA, IGMSERUM No results found for: Ronnald Ramp, A1GS, A2GS, Violet Baldy, MSPIKE, SPEI   Chemistry      Component Value Date/Time   NA 140 10/16/2016 1853   NA 136 08/11/2016 1404  K 3.9 10/16/2016 1853   K 4.2 08/11/2016 1404   CL 107 10/16/2016 1853   CL 103 08/11/2016 1404   CO2 25 10/16/2016 1853   CO2 26 08/11/2016 1404   BUN 15 10/16/2016 1853   BUN 13 08/11/2016 1404   CREATININE 0.85 10/16/2016 1853   CREATININE 0.87 08/11/2016 1404      Component Value Date/Time   CALCIUM 9.1 10/16/2016 1853   CALCIUM 9.5 08/11/2016 1404   ALKPHOS 58 08/11/2016 1404   AST 9 08/11/2016 1404   ALT 8 08/11/2016 1404   BILITOT 0.5 08/11/2016 1404      Impression and Plan: [name] is a very  pleasant African American female with iron deficiency anemia secondary to heavy cycles. She is symptomatic as mentioned above with fatigue, lightheadedness and headaches.  We will see what her iron studies show and bring her back in for infusion if needed.  We will go ahead and plan to see her back in another 6 months for follow-up.  She will contact our office with any questions or concerns. We can certainly see her sooner if need be.   [name], NP 6/25/20193:29 PM

## 2017-10-31 LAB — IRON AND TIBC
IRON: 80 ug/dL (ref 41–142)
SATURATION RATIOS: 17 % — AB (ref 21–57)
TIBC: 486 ug/dL — AB (ref 236–444)
UIBC: 406 ug/dL

## 2017-10-31 LAB — FERRITIN: Ferritin: 6 ng/mL — ABNORMAL LOW (ref 11–307)

## 2017-11-07 ENCOUNTER — Inpatient Hospital Stay: Payer: BC Managed Care – PPO | Attending: Hematology & Oncology

## 2017-11-07 ENCOUNTER — Other Ambulatory Visit: Payer: Self-pay

## 2017-11-07 VITALS — BP 114/54 | HR 74 | Temp 98.5°F | Resp 18

## 2017-11-07 DIAGNOSIS — N920 Excessive and frequent menstruation with regular cycle: Secondary | ICD-10-CM | POA: Insufficient documentation

## 2017-11-07 DIAGNOSIS — D500 Iron deficiency anemia secondary to blood loss (chronic): Secondary | ICD-10-CM

## 2017-11-07 MED ORDER — SODIUM CHLORIDE 0.9 % IV SOLN
510.0000 mg | Freq: Once | INTRAVENOUS | Status: AC
  Administered 2017-11-07: 510 mg via INTRAVENOUS
  Filled 2017-11-07: qty 17

## 2017-11-07 NOTE — Patient Instructions (Signed)

## 2017-11-16 ENCOUNTER — Inpatient Hospital Stay: Payer: BC Managed Care – PPO

## 2017-11-16 VITALS — BP 121/64 | HR 79 | Temp 98.6°F | Resp 17

## 2017-11-16 DIAGNOSIS — D500 Iron deficiency anemia secondary to blood loss (chronic): Secondary | ICD-10-CM | POA: Diagnosis not present

## 2017-11-16 DIAGNOSIS — N920 Excessive and frequent menstruation with regular cycle: Secondary | ICD-10-CM

## 2017-11-16 MED ORDER — SODIUM CHLORIDE 0.9 % IV SOLN
510.0000 mg | Freq: Once | INTRAVENOUS | Status: AC
  Administered 2017-11-16: 510 mg via INTRAVENOUS
  Filled 2017-11-16: qty 17

## 2017-11-16 NOTE — Progress Notes (Signed)
Pt left prior to full 30 minute observation post iron infusion. Pt aware of side effects and risks and verbalized understanding. Pt had no further questions and left clinic in no apparent distress.

## 2017-11-16 NOTE — Patient Instructions (Signed)

## 2017-12-18 ENCOUNTER — Encounter: Payer: Self-pay | Admitting: Family Medicine

## 2017-12-18 ENCOUNTER — Ambulatory Visit: Payer: BC Managed Care – PPO | Admitting: Family Medicine

## 2017-12-18 VITALS — BP 90/64 | HR 72 | Temp 98.3°F | Ht 66.75 in | Wt 207.6 lb

## 2017-12-18 DIAGNOSIS — H00011 Hordeolum externum right upper eyelid: Secondary | ICD-10-CM

## 2017-12-18 NOTE — Progress Notes (Addendum)
  HPI:  Using dictation device. Unfortunately this device frequently misinterprets words/phrases.  Acute visit for swollen eyelid: -started 2 days ago -R eye lid swelling and pruritis -improved some with compresses -no fevers, pus, pain -hx seasonal allergies, taking antihistamine -denies other concerns today  ROS: See pertinent positives and negatives per HPI.  Past Medical History:  Diagnosis Date  . Chicken pox   . Chronic headaches   . Fibroids   . Iron deficiency anemia due to chronic blood loss    from heavy periods  . Menorrhagia     History reviewed. No pertinent surgical history.  Family History  Problem Relation Age of Onset  . Hypertension Mother   . Diabetes Maternal Grandfather   . Hypertension Maternal Uncle     SOCIAL HX: see hpi   Current Outpatient Medications:  .  AVIANE 0.1-20 MG-MCG tablet, , Disp: , Rfl:  .  DYMISTA 137-50 MCG/ACT SUSP, , Disp: , Rfl:  .  levocetirizine (XYZAL) 5 MG tablet, , Disp: , Rfl:  .  montelukast (SINGULAIR) 10 MG tablet, Take 1 tablet (10 mg total) by mouth at bedtime., Disp: 30 tablet, Rfl: 3  EXAM:  Vitals:   12/18/17 1530  BP: 90/64  Pulse: 72  Temp: 98.3 F (36.8 C)    Body mass index is 32.76 kg/m.  GENERAL: vitals reviewed and listed above, alert, oriented, appears well hydrated and in no acute distress  HEENT: atraumatic, conjunttiva clear, EOMI, small swelling R upper eyelid, no drainage, no obvious abnormalities on inspection of external nose and ears  NECK: no obvious masses on inspection  LUNGS: clear to auscultation bilaterally, no wheezes, rales or rhonchi, good air movement  CV: HRRR, no peripheral edema  MS: moves all extremities without noticeable abnormality  PSYCH: pleasant and cooperative, no obvious depression or anxiety  ASSESSMENT AND PLAN:  Discussed the following assessment and plan:  Hordeolum externum of right upper eyelid  -we discussed possible serious and likely  etiologies, workup and treatment, treatment risks and return precautions -after this discussion, Tamara Ball opted for compresses, saline, follow up and/or optho eval if worsens or persists.  Declined printed patient instructions.  There are no Patient Instructions on file for this visit.  [name], DO

## 2018-01-07 ENCOUNTER — Telehealth: Payer: BC Managed Care – PPO | Admitting: Family

## 2018-01-07 DIAGNOSIS — B9689 Other specified bacterial agents as the cause of diseases classified elsewhere: Secondary | ICD-10-CM

## 2018-01-07 DIAGNOSIS — J019 Acute sinusitis, unspecified: Secondary | ICD-10-CM

## 2018-01-07 MED ORDER — AMOXICILLIN-POT CLAVULANATE 875-125 MG PO TABS: 1.0000 | ORAL_TABLET | Freq: Two times a day (BID) | ORAL | 0 refills | Status: DC

## 2018-01-07 NOTE — Progress Notes (Signed)

## 2018-02-12 ENCOUNTER — Emergency Department (HOSPITAL_BASED_OUTPATIENT_CLINIC_OR_DEPARTMENT_OTHER)
Admission: EM | Admit: 2018-02-12 | Discharge: 2018-02-12 | Disposition: A | Discharge status: Home / Self Care | Payer: BC Managed Care – PPO | Attending: Emergency Medicine | Admitting: Emergency Medicine

## 2018-02-12 ENCOUNTER — Other Ambulatory Visit: Payer: Self-pay

## 2018-02-12 ENCOUNTER — Encounter (HOSPITAL_BASED_OUTPATIENT_CLINIC_OR_DEPARTMENT_OTHER): Payer: Self-pay | Admitting: Emergency Medicine

## 2018-02-12 DIAGNOSIS — M62831 Muscle spasm of calf: Secondary | ICD-10-CM

## 2018-02-12 LAB — D-DIMER, QUANTITATIVE: D-Dimer, Quant: 0.35 ug/mL-FEU (ref 0.00–0.50)

## 2018-02-12 NOTE — ED Triage Notes (Signed)
Pt states she woke up today with pain on her left leg, took some Ibuprofen around 3 am, with no relief pt states pain is worse. Denies any injury.

## 2018-02-12 NOTE — ED Provider Notes (Signed)
[facility] Provider Note: Georgena Spurling, MD, FACEP  CSN: [id number] MRN: [id number] ARRIVAL: 02/12/18 at [place]: [place]  Leg Pain   HISTORY OF PRESENT ILLNESS  02/12/18 5:13 AM Tamara Ball is a 37 y.o. female who awoke about 3 AM with a moderate to severe pain in her left calf.  She describes it as feeling like a charley horse.  She took some ibuprofen and laid back down but the pain has persisted.  It has improved and is now about a 4 out of 10.  Because she drives 30 minutes to and from work every day she is concerned about a blood clot.  She is also on birth control pills.  She is not currently having chest pain or shortness of breath.  There is no associated swelling or discoloration of the leg.  She denies injury.   Past Medical History:  Diagnosis Date  . Chicken pox   . Chronic headaches   . Fibroids   . Iron deficiency anemia due to chronic blood loss    from heavy periods  . Menorrhagia     History reviewed. No pertinent surgical history.  Family History  Problem Relation Age of Onset  . Hypertension Mother   . Diabetes Maternal Grandfather   . Hypertension Maternal Uncle     Social History   Tobacco Use  . Smoking status: Never Smoker  . Smokeless tobacco: Never Used  Substance Use Topics  . Alcohol use: Yes    Alcohol/week: 0.0 standard drinks    Comment: social  . Drug use: No    Prior to Admission medications   Medication Sig Start Date End Date Taking? Authorizing Provider  amoxicillin-clavulanate (AUGMENTIN) 875-125 MG tablet Take 1 tablet by mouth 2 (two) times daily. 01/07/18   Kennyth Arnold, FNP  AVIANE 0.1-20 MG-MCG tablet  09/19/16   [provider]  Sande Rives 694-85 MCG/ACT SUSP  06/09/16   [provider]  levocetirizine Harlow Ohms) 5 MG tablet  06/09/16   [provider]  montelukast (SINGULAIR) 10 MG tablet Take 1 tablet (10 mg total) by mouth at bedtime. 11/26/15   [name], DO     Allergies Patient has no known allergies.   REVIEW OF SYSTEMS  Negative except as noted here or in the History of Present Illness.   PHYSICAL EXAMINATION  Initial Vital Signs Blood pressure 130/75, pulse 90, temperature 97.6 F (36.4 C), temperature source Oral, resp. rate 18, height 5\' 6"  (1.676 m), weight 90.7 kg, SpO2 100 %.  Examination General: Well-developed, well-nourished female in no acute distress; appearance consistent with age of record HENT: normocephalic; atraumatic Eyes: pupils equal, round and reactive to light; extraocular muscles intact Neck: supple Heart: regular rate and rhythm Lungs: clear to auscultation bilaterally Abdomen: soft; nondistended; nontender; bowel sounds present Extremities: No deformity; full range of motion; pulses normal; no edema; mild tenderness of left calf without popliteal tenderness or palpable muscle spasm Neurologic: Awake, alert and oriented; motor function intact in all extremities and symmetric; no facial droop Skin: Warm and dry Psychiatric: Normal mood and affect   RESULTS  Summary of this visit's results, reviewed by myself:   EKG Interpretation  Date/Time:    Ventricular Rate:    PR Interval:    QRS Duration:   QT Interval:    QTC Calculation:   R Axis:     Text Interpretation:        Laboratory Studies: Results for  orders placed or performed during the hospital encounter of 02/12/18 (from the past 24 hour(s))  D-dimer, quantitative (not at [facility])     Status: None   Collection Time: 02/12/18  5:22 AM  Result Value Ref Range   D-Dimer, Quant 0.35 0.00 - 0.50 ug/mL-FEU   Imaging Studies: No results found.  ED COURSE and MDM  Nursing notes and initial vitals signs, including pulse oximetry, reviewed.  Vitals:   02/12/18 0510 02/12/18 0511  BP: 130/75   Pulse: 90   Resp: 18   Temp: 97.6 F (36.4 C)   TempSrc: Oral   SpO2: 100%   Weight:  90.7 kg  Height:  5\' 6"  (1.676 m)   Well's score -1,  DDimer within normal limts. Low probability for DVT.  PROCEDURES    ED DIAGNOSES     ICD-10-CM   1. [place] W26.378        Shanon Rosser, MD 02/12/18 [phone]

## 2018-04-30 ENCOUNTER — Other Ambulatory Visit: Payer: BC Managed Care – PPO

## 2018-04-30 ENCOUNTER — Ambulatory Visit: Payer: BC Managed Care – PPO | Admitting: Family

## 2018-05-03 ENCOUNTER — Inpatient Hospital Stay: Payer: BC Managed Care – PPO | Attending: Hematology & Oncology

## 2018-05-03 ENCOUNTER — Other Ambulatory Visit: Payer: Self-pay

## 2018-05-03 ENCOUNTER — Inpatient Hospital Stay (HOSPITAL_BASED_OUTPATIENT_CLINIC_OR_DEPARTMENT_OTHER): Payer: BC Managed Care – PPO | Admitting: Family

## 2018-05-03 ENCOUNTER — Telehealth: Payer: Self-pay | Admitting: Hematology & Oncology

## 2018-05-03 VITALS — BP 125/73 | HR 71 | Temp 98.2°F | Resp 18 | Wt 212.0 lb

## 2018-05-03 DIAGNOSIS — D500 Iron deficiency anemia secondary to blood loss (chronic): Secondary | ICD-10-CM

## 2018-05-03 DIAGNOSIS — Z79899 Other long term (current) drug therapy: Secondary | ICD-10-CM

## 2018-05-03 DIAGNOSIS — N920 Excessive and frequent menstruation with regular cycle: Secondary | ICD-10-CM | POA: Diagnosis not present

## 2018-05-03 LAB — CBC WITH DIFFERENTIAL (CANCER CENTER ONLY)
ABS IMMATURE GRANULOCYTES: 0.03 10*3/uL (ref 0.00–0.07)
BASOS ABS: 0 10*3/uL (ref 0.0–0.1)
Basophils Relative: 0 %
EOS PCT: 6 %
Eosinophils Absolute: 0.5 10*3/uL (ref 0.0–0.5)
HEMATOCRIT: 45.4 % (ref 36.0–46.0)
HEMOGLOBIN: 14.3 g/dL (ref 12.0–15.0)
Immature Granulocytes: 0 %
LYMPHS ABS: 1.8 10*3/uL (ref 0.7–4.0)
LYMPHS PCT: 20 %
MCH: 29.5 pg (ref 26.0–34.0)
MCHC: 31.5 g/dL (ref 30.0–36.0)
MCV: 93.6 fL (ref 80.0–100.0)
Monocytes Absolute: 0.7 10*3/uL (ref 0.1–1.0)
Monocytes Relative: 7 %
NEUTROS ABS: 6.2 10*3/uL (ref 1.7–7.7)
Neutrophils Relative %: 67 %
Platelet Count: 195 10*3/uL (ref 150–400)
RBC: 4.85 MIL/uL (ref 3.87–5.11)
RDW: 12.9 % (ref 11.5–15.5)
WBC Count: 9.3 10*3/uL (ref 4.0–10.5)
nRBC: 0 % (ref 0.0–0.2)

## 2018-05-03 NOTE — Progress Notes (Signed)
Hematology and Oncology Follow Up Visit  Tamara Ball [id number] [birth year] 37 y.o. 05/03/2018   Principle Diagnosis:  Iron deficiency anemia secondary to menorrhagia  Current Therapy:   IV iron as needed    Interim History:  Tamara Ball is here today for follow-up. She is doing well but states that she has some occasional fatigue.  She is on birthcontrol and her cycle is regular but heavy.  No other episodes of bleeding, no bruising or petechiae.  No fever, chills, n/v, cough, rash, dizziness, SOB, chest pain, palpitations, abdominal pain or changes in bowel or bladder habits.  No swelling, tenderness, numbness or tingling in her extremities.  No lymphadenopathy noted on exam.  She has maintained a good appetite but admits that she needs to better hydrate at home. Her weight is stable.  She plans to start going back to the gym next week.   ECOG Performance Status: 1 - Symptomatic but completely ambulatory  Medications:  Allergies as of 05/03/2018   No Known Allergies     Medication List       Accurate as of May 03, 2018  2:14 PM. Always use your most recent med list.        amoxicillin-clavulanate 875-125 MG tablet Commonly known as:  AUGMENTIN Take 1 tablet by mouth 2 (two) times daily.   AVIANE 0.1-20 MG-MCG tablet Generic drug:  levonorgestrel-ethinyl estradiol   DYMISTA 137-50 MCG/ACT Susp Generic drug:  Azelastine-Fluticasone   levocetirizine 5 MG tablet Commonly known as:  XYZAL   montelukast 10 MG tablet Commonly known as:  SINGULAIR Take 1 tablet (10 mg total) by mouth at bedtime.       Allergies: No Known Allergies  Past Medical History, Surgical history, Social history, and Family History were reviewed and updated.  Review of Systems: All other 10 point review of systems is negative.   Physical Exam:  vitals were not taken for this visit.   Wt Readings from Last 3 Encounters:  02/12/18 200 lb (90.7 kg)  12/18/17 207 lb 9.6 oz (94.2  kg)  10/30/17 202 lb (91.6 kg)    Ocular: Sclerae unicteric, pupils equal, round and reactive to light Ear-nose-throat: Oropharynx clear, dentition fair Lymphatic: No cervical, supraclavicular or axillary adenopathy Lungs no rales or rhonchi, good excursion bilaterally Heart regular rate and rhythm, no murmur appreciated Abd soft, nontender, positive bowel sounds, no liver or spleen tip palpated on exam, no fluid wave  MSK no focal spinal tenderness, no joint edema Neuro: non-focal, well-oriented, appropriate affect Breasts: Deferred   Lab Results  Component Value Date   WBC 6.9 10/30/2017   HGB 13.2 10/30/2017   HCT 40.6 10/30/2017   MCV 86.9 10/30/2017   PLT 220 10/30/2017   Lab Results  Component Value Date   FERRITIN 6 (L) 10/30/2017   IRON 80 10/30/2017   TIBC 486 (H) 10/30/2017   UIBC 406 10/30/2017   IRONPCTSAT 17 (L) 10/30/2017   Lab Results  Component Value Date   RETICCTPCT 1.4 05/16/2016   RBC 4.67 10/30/2017   RETICCTABS 64,400 05/16/2016   No results found for: KPAFRELGTCHN, LAMBDASER, KAPLAMBRATIO No results found for: IGGSERUM, IGA, IGMSERUM No results found for: TOTALPROTELP, ALBUMINELP, A1GS, A2GS, BETS, BETA2SER, GAMS, MSPIKE, SPEI   Chemistry      Component Value Date/Time   NA 140 10/16/2016 1853   NA 136 08/11/2016 1404   K 3.9 10/16/2016 1853   K 4.2 08/11/2016 1404   CL 107 10/16/2016 1853   CL 103  08/11/2016 1404   CO2 25 10/16/2016 1853   CO2 26 08/11/2016 1404   BUN 15 10/16/2016 1853   BUN 13 08/11/2016 1404   CREATININE 0.85 10/16/2016 1853   CREATININE 0.87 08/11/2016 1404      Component Value Date/Time   CALCIUM 9.1 10/16/2016 1853   CALCIUM 9.5 08/11/2016 1404   ALKPHOS 58 08/11/2016 1404   AST 9 08/11/2016 1404   ALT 8 08/11/2016 1404   BILITOT 0.5 08/11/2016 1404       Impression and Plan: Tamara Ball is a very pleasant 37 yo African American female with iron deficiency secondary to heavy cycles.  She has had some mild  fatigue at times.  We will see what her iron studies show and bring her back in for infusion if needed.  We will plan to see her back in another 6 months.  She will contact our office with any questions or concerns. We can certainly see her sooner if need be.   [name], NP 12/27/20192:14 PM

## 2018-05-03 NOTE — Telephone Encounter (Signed)
Patient said she will call to sch appt later

## 2018-05-06 LAB — IRON AND TIBC
IRON: 91 ug/dL (ref 41–142)
Saturation Ratios: 24 % (ref 21–57)
TIBC: 383 ug/dL (ref 236–444)
UIBC: 292 ug/dL (ref 120–384)

## 2018-05-06 LAB — FERRITIN: Ferritin: 60 ng/mL (ref 11–307)

## 2018-05-23 NOTE — Progress Notes (Signed)
HPI:  Using dictation device. Unfortunately this device frequently misinterprets words/phrases.  Here for CPE:  -Concerns and/or follow up today:  Sees gyn and on OCP. Sees gyn and hematology for iron def anemia and menorrhagia. Has allergies on singulair, xyzal, dymista. Reports she is on weight list for [facility].  -Diet: variety of foods, balance and well rounded, larger portion sizes - currently doing no meat diet -Exercise: no regular exercise -Taking folic acid, vitamin D or calcium: no -Diabetes and Dyslipidemia Screening: neg screening last year -Vaccines: see vaccine section EPIC -pap history: see gyn -FDLMP: see nursing notes -wants STI testing (Hep C if born 40-65): no -FH breast, colon or ovarian ca: see FH Last mammogram: n/a Last colon cancer screening: n/a Breast Ca Risk Assessment: see family history and pt history DEXA (>/= 65): n/a  -Alcohol, Tobacco, drug use: see social history  Review of Systems - no fevers, unintentional weight loss, vision loss, hearing loss, chest pain, sob, hemoptysis, melena, hematochezia, hematuria, genital discharge, changing or concerning skin lesions, bleeding, bruising, loc, thoughts of self harm or SI  Past Medical History:  Diagnosis Date  . Chicken pox   . Chronic headaches   . Fibroids   . Iron deficiency anemia due to chronic blood loss    from heavy periods  . Menorrhagia     History reviewed. No pertinent surgical history.  Family History  Problem Relation Age of Onset  . Hypertension Mother   . Diabetes Maternal Grandfather   . Hypertension Maternal Uncle     Social History   Socioeconomic History  . Marital status: Single    Spouse name: Not on file  . Number of children: Not on file  . Years of education: Not on file  . Highest education level: Not on file  Occupational History  . Not on file  Social Needs  . Financial resource strain: Not on file  . Food insecurity:   Worry: Not on file    Inability: Not on file  . Transportation needs:    Medical: Not on file    Non-medical: Not on file  Tobacco Use  . Smoking status: Never Smoker  . Smokeless tobacco: Never Used  Substance and Sexual Activity  . Alcohol use: Yes    Alcohol/week: 0.0 standard drinks    Comment: social  . Drug use: No  . Sexual activity: Not on file  Lifestyle  . Physical activity:    Days per week: Not on file    Minutes per session: Not on file  . Stress: Not on file  Relationships  . Social connections:    Talks on phone: Not on file    Gets together: Not on file    Attends religious service: Not on file    Active member of club or organization: Not on file    Attends meetings of clubs or organizations: Not on file    Relationship status: Not on file  Other Topics Concern  . Not on file  Social History Narrative   Work or School: higher education - Engineer, site Situation: lives with 40 yo      Spiritual Beliefs: Christian      Lifestyle: no regular exercise; diet is so so        Current Outpatient Medications:  .  AVIANE 0.1-20 MG-MCG tablet, , Disp: , Rfl:  .  DYMISTA 137-50 MCG/ACT SUSP, , Disp: , Rfl:  .  levocetirizine (  XYZAL) 5 MG tablet, , Disp: , Rfl:  .  montelukast (SINGULAIR) 10 MG tablet, Take 1 tablet (10 mg total) by mouth at bedtime., Disp: 30 tablet, Rfl: 3  EXAM:  Vitals:   05/27/18 0808  BP: 116/80  Pulse: 71  Temp: 98.1 F (36.7 C)  Body mass index is 33.51 kg/m.   GENERAL: vitals reviewed and listed below, alert, oriented, appears well hydrated and in no acute distress  HEENT: head atraumatic, PERRLA, normal appearance of eyes, ears, nose and mouth. moist mucus membranes.  NECK: supple, no masses or lymphadenopathy  LUNGS: clear to auscultation bilaterally, no rales, rhonchi or wheeze  CV: HRRR, no peripheral edema or cyanosis, normal pedal pulses  ABDOMEN: bowel sounds normal, soft, non tender to  palpation, no masses, no rebound or guarding  GU/BREAST: declined  - sees gyn  SKIN: no rash or abnormal lesions  MS: normal gait, moves all extremities normally  NEURO: normal gait, speech and thought processing grossly intact, muscle tone grossly intact throughout  PSYCH: normal affect, pleasant and cooperative  ASSESSMENT AND PLAN:  Discussed the following assessment and plan:  PREVENTIVE EXAM: -Discussed and advised all [name] preventive services health task force level A and B recommendations for age, sex and risks. -Advised at least 150 minutes of exercise per week and a healthy diet with avoidance of (less then 1 serving per week) processed foods, white starches, red meat, fast foods and sweets and consisting of: * 5-9 servings of fresh fruits and vegetables (not corn or potatoes) *nuts and seeds, beans *olives and olive oil *lean meats such as fish and white chicken  *whole grains -labs, studies and vaccines per orders this encounter    Patient Instructions  BEFORE YOU LEAVE: -flu shot -labs -follow up: yearly for physical  We have ordered labs or studies at this visit. It can take up to 1-2 weeks for results and processing. IF results require follow up or explanation, we will call you with instructions. Clinically stable results will be released to your [facility]. If you have not heard from [name] or cannot find your results in [facility] in 2 weeks please contact our office at [phone].  If you are not yet signed up for [facility], please consider signing up.   Preventive Care 40-39 Years, Female Preventive care refers to lifestyle choices and visits with your health care provider that can promote health and wellness. What does preventive care include?   A yearly physical exam. This is also called an annual well check.  Dental exams once or twice a year.  Routine eye exams. Ask your health care provider how often you should have your eyes checked.  Personal lifestyle  choices, including: ? Daily care of your teeth and gums. ? Regular physical activity. ? Eating a healthy diet. ? Avoiding tobacco and drug use. ? Limiting alcohol use. ? Practicing safe sex. ? Taking vitamin and mineral supplements as recommended by your health care provider. What happens during an annual well check? The services and screenings done by your health care provider during your annual well check will depend on your age, overall health, lifestyle risk factors, and family history of disease. Counseling Your health care provider may ask you questions about your:  Alcohol use.  Tobacco use.  Drug use.  Emotional well-being.  Home and relationship well-being.  Sexual activity.  Eating habits.  Work and work Statistician.  Method of birth control.  Menstrual cycle.  Pregnancy history. Screening You may have the following  tests or measurements:  Height, weight, and BMI.  Diabetes screening. This is done by checking your blood sugar (glucose) after you have not eaten for a while (fasting).  Blood pressure.  Lipid and cholesterol levels. These may be checked every 5 years starting at age 42.  Skin check.  Hepatitis C blood test.  Hepatitis B blood test.  Sexually transmitted disease (STD) testing.  BRCA-related cancer screening. This may be done if you have a family history of breast, ovarian, tubal, or peritoneal cancers.  Pelvic exam and Pap test. This may be done every 3 years starting at age 73. Starting at age 76, this may be done every 5 years if you have a Pap test in combination with an HPV test. Discuss your test results, treatment options, and if necessary, the need for more tests with your health care provider. Vaccines Your health care provider may recommend certain vaccines, such as:  Influenza vaccine. This is recommended every year.  Tetanus, diphtheria, and acellular pertussis (Tdap, Td) vaccine. You may need a Td booster every 10  years.  Varicella vaccine. You may need this if you have not been vaccinated.  HPV vaccine. If you are 87 or younger, you may need three doses over 6 months.  Measles, mumps, and rubella (MMR) vaccine. You may need at least one dose of MMR. You may also need a second dose.  Pneumococcal 13-valent conjugate (PCV13) vaccine. You may need this if you have certain conditions and were not previously vaccinated.  Pneumococcal polysaccharide (PPSV23) vaccine. You may need one or two doses if you smoke cigarettes or if you have certain conditions.  Meningococcal vaccine. One dose is recommended if you are age 31-21 years and a first-year college student living in a residence hall, or if you have one of several medical conditions. You may also need additional booster doses.  Hepatitis A vaccine. You may need this if you have certain conditions or if you travel or work in places where you may be exposed to hepatitis A.  Hepatitis B vaccine. You may need this if you have certain conditions or if you travel or work in places where you may be exposed to hepatitis B.  Haemophilus influenzae type b (Hib) vaccine. You may need this if you have certain risk factors. Talk to your health care provider about which screenings and vaccines you need and how often you need them. This information is not intended to replace advice given to you by your health care provider. Make sure you discuss any questions you have with your health care provider. Document Released: 06/20/2001 Document Revised: 12/05/2016 Document Reviewed: 02/23/2015 Elsevier Interactive Patient Education  2019 [employer].          No follow-ups on file.  [name], DO

## 2018-05-27 ENCOUNTER — Ambulatory Visit (INDEPENDENT_AMBULATORY_CARE_PROVIDER_SITE_OTHER): Payer: BC Managed Care – PPO | Admitting: Family Medicine

## 2018-05-27 ENCOUNTER — Encounter: Payer: Self-pay | Admitting: Family Medicine

## 2018-05-27 VITALS — BP 116/80 | HR 71 | Temp 98.1°F | Ht 66.0 in | Wt 207.6 lb

## 2018-05-27 DIAGNOSIS — Z23 Encounter for immunization: Secondary | ICD-10-CM | POA: Diagnosis not present

## 2018-05-27 DIAGNOSIS — E669 Obesity, unspecified: Secondary | ICD-10-CM

## 2018-05-27 DIAGNOSIS — Z0000 Encounter for general adult medical examination without abnormal findings: Secondary | ICD-10-CM

## 2018-05-27 LAB — LIPID PANEL
Cholesterol: 119 mg/dL (ref 0–200)
HDL: 50.6 mg/dL (ref >39.00)
LDL Cholesterol: 58 mg/dL (ref 0–99)
NonHDL: 68.37
Total CHOL/HDL Ratio: 2
Triglycerides: 52 mg/dL (ref 0.0–149.0)
VLDL: 10.4 mg/dL (ref 0.0–40.0)

## 2018-05-27 LAB — HEMOGLOBIN A1C: Hgb A1c MFr Bld: 5 % (ref 4.6–6.5)

## 2018-05-27 NOTE — Addendum Note (Signed)
Addended by: Agnes Lawrence on: 05/27/2018 08:57 AM   Modules accepted: Orders

## 2018-05-27 NOTE — Addendum Note (Signed)
Addended by: [name] on: 05/27/2018 08:47 AM   Modules accepted: Orders

## 2018-05-27 NOTE — Patient Instructions (Signed)
BEFORE YOU LEAVE: -flu shot -labs -follow up: yearly for physical  We have ordered labs or studies at this visit. It can take up to 1-2 weeks for results and processing. IF results require follow up or explanation, we will call you with instructions. Clinically stable results will be released to your [facility]. If you have not heard from [name] or cannot find your results in [facility] in 2 weeks please contact our office at [phone].  If you are not yet signed up for [facility], please consider signing up.   Preventive Care 40-39 Years, Female Preventive care refers to lifestyle choices and visits with your health care provider that can promote health and wellness. What does preventive care include?   A yearly physical exam. This is also called an annual well check.  Dental exams once or twice a year.  Routine eye exams. Ask your health care provider how often you should have your eyes checked.  Personal lifestyle choices, including: ? Daily care of your teeth and gums. ? Regular physical activity. ? Eating a healthy diet. ? Avoiding tobacco and drug use. ? Limiting alcohol use. ? Practicing safe sex. ? Taking vitamin and mineral supplements as recommended by your health care provider. What happens during an annual well check? The services and screenings done by your health care provider during your annual well check will depend on your age, overall health, lifestyle risk factors, and family history of disease. Counseling Your health care provider may ask you questions about your:  Alcohol use.  Tobacco use.  Drug use.  Emotional well-being.  Home and relationship well-being.  Sexual activity.  Eating habits.  Work and work Statistician.  Method of birth control.  Menstrual cycle.  Pregnancy history. Screening You may have the following tests or measurements:  Height, weight, and BMI.  Diabetes screening. This is done by checking your blood sugar (glucose) after you  have not eaten for a while (fasting).  Blood pressure.  Lipid and cholesterol levels. These may be checked every 5 years starting at age 40.  Skin check.  Hepatitis C blood test.  Hepatitis B blood test.  Sexually transmitted disease (STD) testing.  BRCA-related cancer screening. This may be done if you have a family history of breast, ovarian, tubal, or peritoneal cancers.  Pelvic exam and Pap test. This may be done every 3 years starting at age 40. Starting at age 40, this may be done every 5 years if you have a Pap test in combination with an HPV test. Discuss your test results, treatment options, and if necessary, the need for more tests with your health care provider. Vaccines Your health care provider may recommend certain vaccines, such as:  Influenza vaccine. This is recommended every year.  Tetanus, diphtheria, and acellular pertussis (Tdap, Td) vaccine. You may need a Td booster every 10 years.  Varicella vaccine. You may need this if you have not been vaccinated.  HPV vaccine. If you are 40 or younger, you may need three doses over 6 months.  Measles, mumps, and rubella (MMR) vaccine. You may need at least one dose of MMR. You may also need a second dose.  Pneumococcal 13-valent conjugate (PCV13) vaccine. You may need this if you have certain conditions and were not previously vaccinated.  Pneumococcal polysaccharide (PPSV23) vaccine. You may need one or two doses if you smoke cigarettes or if you have certain conditions.  Meningococcal vaccine. One dose is recommended if you are age 40-21 years and a first-year college  student living in a residence hall, or if you have one of several medical conditions. You may also need additional booster doses.  Hepatitis A vaccine. You may need this if you have certain conditions or if you travel or work in places where you may be exposed to hepatitis A.  Hepatitis B vaccine. You may need this if you have certain conditions or if  you travel or work in places where you may be exposed to hepatitis B.  Haemophilus influenzae type b (Hib) vaccine. You may need this if you have certain risk factors. Talk to your health care provider about which screenings and vaccines you need and how often you need them. This information is not intended to replace advice given to you by your health care provider. Make sure you discuss any questions you have with your health care provider. Document Released: 06/20/2001 Document Revised: 12/05/2016 Document Reviewed: 02/23/2015 Elsevier Interactive Patient Education  2019 [employer].

## 2018-06-04 ENCOUNTER — Encounter (INDEPENDENT_AMBULATORY_CARE_PROVIDER_SITE_OTHER): Payer: Self-pay

## 2018-06-05 ENCOUNTER — Other Ambulatory Visit: Payer: Self-pay | Admitting: Family Medicine

## 2018-06-05 MED ORDER — MONTELUKAST SODIUM 10 MG PO TABS: 10.0000 mg | ORAL_TABLET | Freq: Every day | ORAL | 3 refills | Status: DC

## 2018-06-05 MED ORDER — LEVOCETIRIZINE DIHYDROCHLORIDE 5 MG PO TABS: 5.0000 mg | ORAL_TABLET | Freq: Every evening | ORAL | 3 refills | Status: DC

## 2018-06-05 NOTE — Telephone Encounter (Signed)
Requested medication (s) are due for refill today -yes  Requested medication (s) are on the active medication list -yes- expired  Future visit scheduled -no  Last refill: historical medications  Notes to clinic: patient is requesting refills of historical/expired medications. Patient is current with her visits and meets protocol for refills  Requested Prescriptions  Pending Prescriptions Disp Refills   montelukast (SINGULAIR) 10 MG tablet 30 tablet 3    Sig: Take 1 tablet (10 mg total) by mouth at bedtime.     Pulmonology:  Leukotriene Inhibitors Passed - 06/05/2018 12:20 PM      Passed - Valid encounter within last 12 months    Recent Outpatient Visits          1 week ago Encounter for preventive health examination   Therapist, [facility] at [employer], [place], DO   5 months ago Hordeolum externum of right upper eyelid   Therapist, music at [employer], [place], DO   10 months ago Constipation, unspecified constipation type   Therapist, music at [employer], [place], DO   12 months ago Influenza-like symptoms   Therapist, music at [employer], [place], DO   1 year ago Encounter for preventative adult health care examination   Therapist, music at [employer], [place], DO            levocetirizine (XYZAL) 5 MG tablet       Ear, Nose, and Throat:  Antihistamines Passed - 06/05/2018 12:20 PM      Passed - Valid encounter within last 12 months    Recent Outpatient Visits          1 week ago Encounter for preventive health examination   Therapist, music at [employer], [place], DO   5 months ago Hordeolum externum of right upper eyelid   Therapist, music at [facility], [place], DO   10 months ago Constipation, unspecified constipation type   Therapist, music at [employer], [place], DO   12 months ago Influenza-like symptoms   Therapist, music at [facility], [place], DO   1 year ago Encounter for preventative adult health  care examination   Therapist, music at [employer], [place] R, DO              Requested Prescriptions  Pending Prescriptions Disp Refills   montelukast (SINGULAIR) 10 MG tablet 30 tablet 3    Sig: Take 1 tablet (10 mg total) by mouth at bedtime.     Pulmonology:  Leukotriene Inhibitors Passed - 06/05/2018 12:20 PM      Passed - Valid encounter within last 12 months    Recent Outpatient Visits          1 week ago Encounter for preventive health examination   Therapist, music at [facility], [place], DO   5 months ago Hordeolum externum of right upper eyelid   Therapist, music at [employer], [place], DO   10 months ago Constipation, unspecified constipation type   Therapist, music at [facility], [place], DO   12 months ago Influenza-like symptoms   Therapist, music at [employer], [place], DO   1 year ago Encounter for preventative adult health care examination   Therapist, music at [employer], [place] R, DO            levocetirizine (XYZAL) 5 MG tablet       Ear, Nose, and Throat:  Antihistamines Passed - 06/05/2018 12:20 PM  Passed - Valid encounter within last 12 months    Recent Outpatient Visits          1 week ago Encounter for preventive health examination   Therapist, music at [place], DO   5 months ago Hordeolum externum of right upper eyelid   Therapist, music at [place], DO   10 months ago Constipation, unspecified constipation type   Therapist, music at [employer], [place], DO   12 months ago Influenza-like symptoms   Therapist, music at [employer], [place], DO   1 year ago Encounter for preventative adult health care examination   Therapist, music at [employer], [place], DO

## 2018-06-05 NOTE — Telephone Encounter (Signed)
Copied from [place] [id number]. Topic: Quick Communication - Rx Refill/Question >> Jun 05, 2018 12:17 PM Selinda Flavin B, [place] wrote: **Patient states that her allergist is out of the office until March. States that the allergist suggested she check and see if PCP is willing to refill.**  Medication: montelukast (SINGULAIR) 10 MG tablet levocetirizine (XYZAL) 5 MG tablet  Has the patient contacted their pharmacy? Yes.   (Agent: If no, request that the patient contact the pharmacy for the refill.) (Agent: If yes, when and what did the pharmacy advise?)  Preferred Pharmacy (with phone number or street name): [facility], [place].  Agent: Please be advised that RX refills may take up to 3 business days. We ask that you follow-up with your pharmacy.

## 2018-06-10 ENCOUNTER — Ambulatory Visit (INDEPENDENT_AMBULATORY_CARE_PROVIDER_SITE_OTHER): Payer: Self-pay | Admitting: Bariatrics

## 2018-06-13 ENCOUNTER — Ambulatory Visit (INDEPENDENT_AMBULATORY_CARE_PROVIDER_SITE_OTHER): Payer: BC Managed Care – PPO | Admitting: Bariatrics

## 2018-06-13 ENCOUNTER — Encounter (INDEPENDENT_AMBULATORY_CARE_PROVIDER_SITE_OTHER): Payer: Self-pay | Admitting: Bariatrics

## 2018-06-13 VITALS — BP 127/78 | HR 74 | Temp 97.8°F | Ht 66.0 in | Wt 205.0 lb

## 2018-06-13 DIAGNOSIS — Z9189 Other specified personal risk factors, not elsewhere classified: Secondary | ICD-10-CM

## 2018-06-13 DIAGNOSIS — Z1331 Encounter for screening for depression: Secondary | ICD-10-CM

## 2018-06-13 DIAGNOSIS — E66811 Obesity, class 1: Secondary | ICD-10-CM

## 2018-06-13 DIAGNOSIS — Z0289 Encounter for other administrative examinations: Secondary | ICD-10-CM

## 2018-06-13 DIAGNOSIS — E669 Obesity, unspecified: Secondary | ICD-10-CM

## 2018-06-13 DIAGNOSIS — E559 Vitamin D deficiency, unspecified: Secondary | ICD-10-CM

## 2018-06-13 DIAGNOSIS — R5383 Other fatigue: Secondary | ICD-10-CM | POA: Diagnosis not present

## 2018-06-13 DIAGNOSIS — D500 Iron deficiency anemia secondary to blood loss (chronic): Secondary | ICD-10-CM

## 2018-06-13 DIAGNOSIS — R0602 Shortness of breath: Secondary | ICD-10-CM

## 2018-06-13 DIAGNOSIS — K5909 Other constipation: Secondary | ICD-10-CM

## 2018-06-13 DIAGNOSIS — Z6833 Body mass index (BMI) 33.0-33.9, adult: Secondary | ICD-10-CM

## 2018-06-14 LAB — TSH: TSH: 2.25 u[IU]/mL (ref 0.450–4.500)

## 2018-06-14 LAB — VITAMIN D 25 HYDROXY (VIT D DEFICIENCY, FRACTURES): Vit D, 25-Hydroxy: 40.4 ng/mL (ref 30.0–100.0)

## 2018-06-14 LAB — INSULIN, RANDOM: INSULIN: 8.9 u[IU]/mL (ref 2.6–24.9)

## 2018-06-14 LAB — T3: T3, Total: 132 ng/dL (ref 71–180)

## 2018-06-14 LAB — T4, FREE: Free T4: 1.28 ng/dL (ref 0.82–1.77)

## 2018-06-17 DIAGNOSIS — E669 Obesity, unspecified: Secondary | ICD-10-CM | POA: Insufficient documentation

## 2018-06-17 DIAGNOSIS — Z6833 Body mass index (BMI) 33.0-33.9, adult: Secondary | ICD-10-CM

## 2018-06-17 NOTE — Progress Notes (Signed)
Office: [phone]  /  Fax: [phone]   Dear Tamara Ball and Dr. Garwin Brothers,   Thank you for referring Tamara Ball to our clinic. The following note includes my evaluation and treatment recommendations.  HPI:   Chief Complaint: OBESITY    Tamara Ball has been referred by Tamara Kern, DO and Tamara Salina, MD for consultation regarding her obesity and obesity related comorbidities.    Tamara Ball (MR# [id number]) is a 38 y.o. female who presents on 06/17/2018 for obesity evaluation and treatment. Current BMI is Body mass index is 33.09 kg/m.Marland Kitchen Tamara Ball has been struggling with her weight for many years and has been unsuccessful in either losing weight, maintaining weight loss, or reaching her healthy weight goal.     Tamara Ball attended our information session and states she is currently in the action stage of change and ready to dedicate time achieving and maintaining a healthier weight. Tamara Ball is interested in becoming our patient and working on intensive lifestyle modifications including (but not limited to) diet, exercise and weight loss.    Tamara Ball states her family eats meals together she thinks her family will eat healthier with  her her desired weight loss is 25 lbs. she started gaining weight in the past 2 years her heaviest weight ever was 207 lbs. she states that she craves sweets she snacks frequently in the evenings she is frequently drinking liquids with calories she  sometimes makes poor food choices she struggles with emotional eating    Fatigue Tamara Ball feels her energy is lower than it should be. Her fatigue is related to anemia. Tamara Ball admits to daytime somnolence and she admits to waking up still tired. Patient is at risk for obstructive sleep apnea. Patent has a history of symptoms of daytime fatigue, morning fatigue and morning headache. Patient generally gets 5 to 8 hours of sleep per night, and states they generally have restless sleep. Snoring is not present.  Apneic episodes are not present. Epworth Sleepiness Score is 6  Dyspnea on exertion Tamara Ball notes increasing shortness of breath with exercising and seems to be worsening over time with weight gain. She notes getting out of breath sooner with certain activities (climbing stairs) than she used to. This has not gotten worse recently. Tamara Ball denies orthopnea.  Iron Deficiency Anemia (blood loss) Tamara Ball has a diagnosis of anemia.  She notes menorrhagia and fibroids. Tamara Ball has had iron and the last one was around one year ago. She is currently controlled.  Vitamin D deficiency Tamara Ball has a diagnosis of vitamin D deficiency. She is not currently taking vit D and denies nausea, vomiting or muscle weakness.  At risk for osteopenia and osteoporosis Tamara Ball is at higher risk of osteopenia and osteoporosis due to vitamin D deficiency.   Constipation Tamara Ball is on stool softeners and she is taking fiber as needed. She denies hematochezia or melena.   Depression Screen Tamara Ball Food and Mood (modified PHQ-9) score was  Depression screen PHQ 2/9 06/13/2018  Decreased Interest 0  Down, Depressed, Hopeless 0  PHQ - 2 Score 0  Altered sleeping 0  Tired, decreased energy 1  Change in appetite 0  Feeling bad or failure about yourself  0  Trouble concentrating 0  Moving slowly or fidgety/restless 0  Suicidal thoughts 0  PHQ-9 Score 1  Difficult doing work/chores Not difficult at all    ASSESSMENT AND PLAN:  Other fatigue - Plan: EKG 12-Lead, Insulin, random, T3, T4, free, TSH  Shortness of breath on exertion  Iron deficiency anemia due to chronic blood loss  Vitamin D deficiency - Plan: VITAMIN D 25 Hydroxy (Vit-D Deficiency, Fractures)  Other constipation  Depression screening  At risk for osteoporosis  Class 1 obesity with serious comorbidity and body mass index (BMI) of 33.0 to 33.9 in adult, unspecified obesity type  PLAN:  Fatigue Tamara Ball was informed that her fatigue may be  related to obesity, depression or many other causes. Labs will be ordered, and in the meanwhile Tamara Ball has agreed to work on diet, exercise and weight loss to help with fatigue. Proper sleep hygiene was discussed including the need for 7-8 hours of quality sleep each night. A sleep study was not ordered based on symptoms and Epworth score.  Dyspnea on exertion Tamara Ball's shortness of breath appears to be obesity related and exercise induced. She has agreed to work on weight loss and gradually increase exercise to treat her exercise induced shortness of breath. If Tamara Ball follows our instructions and loses weight without improvement of her shortness of breath, we will plan to refer to pulmonology. We will monitor this condition regularly. Tamara Ball agrees to this plan.  Iron Deficiency Anemia (blood loss) The diagnosis of Iron deficiency anemia was discussed with Tamara Ball and was explained in detail. She was given suggestions of iron rich foods and iron supplement was not prescribed. Tamara Ball will continue to follow up with her PCP and hematologist.  Vitamin D Deficiency Tamara Ball was informed that low vitamin D levels contributes to fatigue and are associated with obesity, breast, and colon cancer. We will check vitamin D level and she will follow up for routine testing of vitamin D, at least 2-3 times per year.   At risk for osteopenia and osteoporosis Tamara Ball was given extended  (15 minutes) osteoporosis prevention counseling today. Tamara Ball is at risk for osteopenia and osteoporosis due to her vitamin D deficiency. She was encouraged to take her vitamin D and follow her higher calcium diet and increase strengthening exercise to help strengthen her bones and decrease her risk of osteopenia and osteoporosis.  Constipation Tamara Ball was informed decrease bowel movement frequency is normal while losing weight, but stools should not be hard or painful. She was advised to increase her H20 intake and work on increasing  her fiber intake and vegetables. High fiber foods were discussed today. Tamara Ball will continue stool softeners and follow up as directed.  Depression Screen Tamara Ball had a negative depression screening. Depression is commonly associated with obesity and often results in emotional eating behaviors. We will monitor this closely and work on CBT to help improve the non-hunger eating patterns.   Obesity Tamara Ball is currently in the action stage of change and her goal is to continue with weight loss efforts. I recommend Tamara Ball begin the structured treatment plan as follows:  She has agreed to follow the Category 4 plan Tamara Ball has been instructed to eventually work up to a goal of 150 minutes of combined cardio and strengthening exercise per week for weight loss and overall health benefits. We discussed the following Behavioral Modification Strategies today: increase H2O intake, keeping healthy foods in the home, healthy snacks, increasing lean protein intake, decreasing simple carbohydrates, increasing vegetables and work on meal planning and preparation Tamara Ball will take her lunch.   She was informed of the importance of frequent follow up visits to maximize her success with intensive lifestyle modifications for her multiple health conditions. She was informed we would discuss her lab results at her next visit unless there is a critical  issue that needs to be addressed sooner. Tamara Ball agreed to keep her next visit at the agreed upon time to discuss these results.  ALLERGIES: No Known Allergies  MEDICATIONS: Current Outpatient Medications on File Prior to Visit  Medication Sig Dispense Refill  . AVIANE 0.1-20 MG-MCG tablet     . b complex vitamins tablet Take 1 tablet by mouth daily.    . Biotin 10000 MCG TABS Take 1 tablet by mouth daily.    Marland Kitchen DYMISTA 137-50 MCG/ACT SUSP     . levocetirizine (XYZAL) 5 MG tablet Take 1 tablet (5 mg total) by mouth every evening. 30 tablet 3  . montelukast (SINGULAIR) 10  MG tablet Take 1 tablet (10 mg total) by mouth at bedtime. 30 tablet 3  . Multiple Vitamin (MULTIVITAMIN WITH MINERALS) TABS tablet Take 1 tablet by mouth daily.    . Omega-3 Fatty Acids (FISH OIL) 1000 MG CAPS Take 1 capsule by mouth daily.     No current facility-administered medications on file prior to visit.     PAST MEDICAL HISTORY: Past Medical History:  Diagnosis Date  . Anemia   . Anxiety   . Chicken pox   . Chronic headaches   . Constipation   . Fibroids   . Iron deficiency anemia due to chronic blood loss    from heavy periods  . Menorrhagia   . Palpitations     PAST SURGICAL HISTORY: History reviewed. No pertinent surgical history.  SOCIAL HISTORY: Social History   Tobacco Use  . Smoking status: Never Smoker  . Smokeless tobacco: Never Used  Substance Use Topics  . Alcohol use: Yes    Alcohol/week: 0.0 standard drinks    Comment: social  . Drug use: No    FAMILY HISTORY: Family History  Problem Relation Age of Onset  . Hypertension Mother   . Cancer Mother   . Diabetes Maternal Grandfather   . Hypertension Maternal Uncle     ROS: Review of Systems  Constitutional: Positive for malaise/fatigue.  HENT: Positive for congestion (nasal stuffiness), sinus pain and tinnitus.   Eyes:       + Wear Glasses or Contacts  Respiratory: Positive for shortness of breath (on exertion).   Gastrointestinal: Positive for constipation. Negative for melena, nausea and vomiting.       Negative for hematochezia  Musculoskeletal:       Negative for muscle weakness  Neurological: Positive for headaches.  Psychiatric/Behavioral: The patient has insomnia.     PHYSICAL EXAM: Blood pressure 127/78, pulse 74, temperature 97.8 F (36.6 C), temperature source Oral, height 5\' 6"  (1.676 m), weight 205 lb (93 kg), SpO2 99 %. Body mass index is 33.09 kg/m. Physical Exam Vitals signs reviewed.  Constitutional:      Appearance: Normal appearance. She is well-developed. She  is obese.  HENT:     Head: Normocephalic and atraumatic.     Nose: Nose normal.  Eyes:     General: No scleral icterus.    Extraocular Movements: Extraocular movements intact.  Neck:     Musculoskeletal: Normal range of motion and neck supple.     Thyroid: No thyromegaly.  Cardiovascular:     Rate and Tamara Ball: Normal rate and regular Tamara Ball.  Pulmonary:     Effort: Pulmonary effort is normal. No respiratory distress.  Abdominal:     Palpations: Abdomen is soft.     Tenderness: There is no abdominal tenderness.  Musculoskeletal: Normal range of motion.     Comments: Range of  Motion normal in all 4 extremities  Skin:    General: Skin is warm and dry.  Neurological:     Mental Status: She is alert and oriented to person, place, and time.     Coordination: Coordination normal.  Psychiatric:        Mood and Affect: Mood normal.        Behavior: Behavior normal.     RECENT LABS AND TESTS: BMET    Component Value Date/Time   NA 140 10/16/2016 1853   NA 136 08/11/2016 1404   K 3.9 10/16/2016 1853   K 4.2 08/11/2016 1404   CL 107 10/16/2016 1853   CL 103 08/11/2016 1404   CO2 25 10/16/2016 1853   CO2 26 08/11/2016 1404   GLUCOSE 103 (H) 10/16/2016 1853   BUN 15 10/16/2016 1853   BUN 13 08/11/2016 1404   CREATININE 0.85 10/16/2016 1853   CREATININE 0.87 08/11/2016 1404   CALCIUM 9.1 10/16/2016 1853   CALCIUM 9.5 08/11/2016 1404   GFRNONAA >60 10/16/2016 1853   GFRAA >60 10/16/2016 1853   Lab Results  Component Value Date   HGBA1C 5.0 05/27/2018   Lab Results  Component Value Date   INSULIN 8.9 06/13/2018   CBC    Component Value Date/Time   WBC 9.3 05/03/2018 1408   WBC 8.2 10/16/2016 1853   RBC 4.85 05/03/2018 1408   HGB 14.3 05/03/2018 1408   HGB 14.2 04/20/2017 1524   HCT 45.4 05/03/2018 1408   HCT 43.2 04/20/2017 1524   PLT 195 05/03/2018 1408   PLT 199 04/20/2017 1524   MCV 93.6 05/03/2018 1408   MCV 90 04/20/2017 1524   MCH 29.5 05/03/2018 1408    MCHC 31.5 05/03/2018 1408   RDW 12.9 05/03/2018 1408   RDW 13.5 04/20/2017 1524   LYMPHSABS 1.8 05/03/2018 1408   LYMPHSABS 1.6 04/20/2017 1524   MONOABS 0.7 05/03/2018 1408   EOSABS 0.5 05/03/2018 1408   EOSABS 0.4 04/20/2017 1524   BASOSABS 0.0 05/03/2018 1408   BASOSABS 0.1 04/20/2017 1524   Iron/TIBC/Ferritin/ %Sat    Component Value Date/Time   IRON 91 05/03/2018 1408   IRON 103 04/20/2017 1524   TIBC 383 05/03/2018 1408   TIBC 450 (H) 04/20/2017 1524   FERRITIN 60 05/03/2018 1408   FERRITIN 23 04/20/2017 1524   IRONPCTSAT 24 05/03/2018 1408   IRONPCTSAT 23 04/20/2017 1524   IRONPCTSAT 18 (L) 10/30/2014 1213   Lipid Panel     Component Value Date/Time   CHOL 119 05/27/2018 0848   TRIG 52.0 05/27/2018 0848   HDL 50.60 05/27/2018 0848   CHOLHDL 2 05/27/2018 0848   VLDL 10.4 05/27/2018 0848   LDLCALC 58 05/27/2018 0848   Hepatic Function Panel     Component Value Date/Time   PROT 7.3 08/11/2016 1404   ALBUMIN 4.2 08/11/2016 1404   AST 9 08/11/2016 1404   ALT 8 08/11/2016 1404   ALKPHOS 58 08/11/2016 1404   BILITOT 0.5 08/11/2016 1404   BILIDIR 0.0 06/24/2012 1055      Component Value Date/Time   TSH 2.250 06/13/2018 1123   TSH 1.28 01/26/2016 1358   TSH 2.46 04/28/2014 0957    ECG  shows NSR with a rate of 74 BPM INDIRECT CALORIMETER done today shows a VO2 of 312 and a REE of 2174.  Her calculated basal metabolic rate is 9628 thus her basal metabolic rate is better than expected.       OBESITY BEHAVIORAL INTERVENTION VISIT  Today's visit was # 1   Starting weight: 205 lbs Starting date: 06/13/2018 Today's weight : 205 lbs Today's date: 06/13/2018 Total lbs lost to date: 0   ASK: We discussed the diagnosis of obesity with Tamara Ball today and Tamara Ball agreed to give [name] permission to discuss obesity behavioral modification therapy today.  ASSESS: Marypat has the diagnosis of obesity and her BMI today is 33.1 Janee is in the action stage of  change   ADVISE: Dailah was educated on the multiple health risks of obesity as well as the benefit of weight loss to improve her health. She was advised of the need for long term treatment and the importance of lifestyle modifications to improve her current health and to decrease her risk of future health problems.  AGREE: Multiple dietary modification options and treatment options were discussed and  Manar agreed to follow the recommendations documented in the above note.  ARRANGE: Deziah was educated on the importance of frequent visits to treat obesity as outlined per CMS and USPSTF guidelines and agreed to schedule her next follow up appointment today.  Corey Skains, am acting as Location manager for [employer]. Owens Shark, DO  I have reviewed the above documentation for accuracy and completeness, and I agree with the above. -Jearld Lesch, DO

## 2018-06-27 ENCOUNTER — Ambulatory Visit (INDEPENDENT_AMBULATORY_CARE_PROVIDER_SITE_OTHER): Payer: BC Managed Care – PPO | Admitting: Bariatrics

## 2018-06-27 ENCOUNTER — Encounter (INDEPENDENT_AMBULATORY_CARE_PROVIDER_SITE_OTHER): Payer: Self-pay | Admitting: Bariatrics

## 2018-06-27 VITALS — BP 110/73 | HR 69 | Temp 98.1°F | Ht 66.0 in | Wt 203.0 lb

## 2018-06-27 DIAGNOSIS — E669 Obesity, unspecified: Secondary | ICD-10-CM

## 2018-06-27 DIAGNOSIS — K5909 Other constipation: Secondary | ICD-10-CM | POA: Diagnosis not present

## 2018-06-27 DIAGNOSIS — E559 Vitamin D deficiency, unspecified: Secondary | ICD-10-CM

## 2018-06-27 DIAGNOSIS — Z9189 Other specified personal risk factors, not elsewhere classified: Secondary | ICD-10-CM

## 2018-06-27 DIAGNOSIS — Z6832 Body mass index (BMI) 32.0-32.9, adult: Secondary | ICD-10-CM

## 2018-06-28 ENCOUNTER — Encounter (INDEPENDENT_AMBULATORY_CARE_PROVIDER_SITE_OTHER): Payer: Self-pay | Admitting: Bariatrics

## 2018-07-01 NOTE — Progress Notes (Signed)
Office: [phone]  /  Fax: [phone]   HPI:   Chief Complaint: OBESITY Tamara Ball is here to discuss her progress with her obesity treatment plan. She is on the Category 4 plan and is following her eating plan approximately 90 % of the time. She states she is exercising 0 minutes 0 times per week. Tamara Ball is doing well. She states that she is getting bored with some of the foods. It is more food than I have eaten. She has not eaten all of the food. Her weight is 203 lb (92.1 kg) today and has had a weight loss of 2 pounds over a period of 2 weeks since her last visit. She has lost 2 lbs since starting treatment with [name].  Vitamin D deficiency Tamara Ball has a diagnosis of vitamin D deficiency. Her last vitamin D level was at 40.4 She is not currently taking vit D and denies nausea, vomiting or muscle weakness.  At risk for osteopenia and osteoporosis Tamara Ball is at higher risk of osteopenia and osteoporosis due to vitamin D deficiency.   Constipation Tamara Ball has constipation and she is on stool softeners and fiber. She denies hematochezia or melena.   ASSESSMENT AND PLAN:  Vitamin D deficiency  Other constipation  At risk for osteoporosis  Class 1 obesity with serious comorbidity and body mass index (BMI) of 32.0 to 32.9 in adult, unspecified obesity type  PLAN:  Vitamin D Deficiency Tamara Ball was informed that low vitamin D levels contributes to fatigue and are associated with obesity, breast, and colon cancer. She agrees to begin OTC Vit D @2 ,000 IU daily and will follow up for routine testing of vitamin D, at least 2-3 times per year. She was informed of the risk of over-replacement of vitamin D and agrees to not increase her dose unless she discusses this with [name] first. Tamara Ball agrees to follow up as directed.  At risk for osteopenia and osteoporosis Tamara Ball was given extended  (15 minutes) osteoporosis prevention counseling today. Tamara Ball is at risk for osteopenia and osteoporosis due  to her vitamin D deficiency. She was encouraged to take her vitamin D and follow her higher calcium diet and increase strengthening exercise to help strengthen her bones and decrease her risk of osteopenia and osteoporosis.  Constipation Tamara Ball will continue stool softeners and she will begin OTC Miralax daily and follow up as directed.  Obesity Tamara Ball is currently in the action stage of change. As such, her goal is to continue with weight loss efforts She has agreed to follow the Category 4 plan Tamara Ball has been instructed to work up to a goal of 150 minutes of combined cardio and strengthening exercise per week for weight loss and overall health benefits. We discussed the following Behavioral Modification Strategies today: increase H2O intake, no skipping meals, keeping healthy foods in the home, increasing lean protein intake, decreasing simple carbohydrates, increasing vegetables and work on meal planning and easy cooking plans Tamara Ball will move the food around throughout the day. Another meat option for breakfast and additional lunch options were provided to patient today.  Tamara Ball has agreed to follow up with our clinic in 2 weeks. She was informed of the importance of frequent follow up visits to maximize her success with intensive lifestyle modifications for her multiple health conditions.  ALLERGIES: No Known Allergies  MEDICATIONS: Current Outpatient Medications on File Prior to Visit  Medication Sig Dispense Refill  . AVIANE 0.1-20 MG-MCG tablet     . b complex vitamins tablet Take 1  tablet by mouth daily.    . Biotin 10000 MCG TABS Take 1 tablet by mouth daily.    Marland Kitchen DYMISTA 137-50 MCG/ACT SUSP     . levocetirizine (XYZAL) 5 MG tablet Take 1 tablet (5 mg total) by mouth every evening. 30 tablet 3  . montelukast (SINGULAIR) 10 MG tablet Take 1 tablet (10 mg total) by mouth at bedtime. 30 tablet 3  . Multiple Vitamin (MULTIVITAMIN WITH MINERALS) TABS tablet Take 1 tablet by mouth  daily.    . Omega-3 Fatty Acids (FISH OIL) 1000 MG CAPS Take 1 capsule by mouth daily.     No current facility-administered medications on file prior to visit.     PAST MEDICAL HISTORY: Past Medical History:  Diagnosis Date  . Anemia   . Anxiety   . Chicken pox   . Chronic headaches   . Constipation   . Fibroids   . Iron deficiency anemia due to chronic blood loss    from heavy periods  . Menorrhagia   . Palpitations     PAST SURGICAL HISTORY: History reviewed. No pertinent surgical history.  SOCIAL HISTORY: Social History   Tobacco Use  . Smoking status: Never Smoker  . Smokeless tobacco: Never Used  Substance Use Topics  . Alcohol use: Yes    Alcohol/week: 0.0 standard drinks    Comment: social  . Drug use: No    FAMILY HISTORY: Family History  Problem Relation Age of Onset  . Hypertension Mother   . Cancer Mother   . Diabetes Maternal Grandfather   . Hypertension Maternal Uncle     ROS: Review of Systems  Constitutional: Positive for weight loss.  Gastrointestinal: Positive for constipation. Negative for melena, nausea and vomiting.       Negative for hematochezia  Musculoskeletal:       Negative for muscle weakness    PHYSICAL EXAM: Blood pressure 110/73, pulse 69, temperature 98.1 F (36.7 C), temperature source Oral, height 5\' 6"  (1.676 m), weight 203 lb (92.1 kg), SpO2 100 %. Body mass index is 32.77 kg/m. Physical Exam Vitals signs reviewed.  Constitutional:      Appearance: Normal appearance. She is well-developed. She is obese.  Cardiovascular:     Rate and Rhythm: Normal rate.  Pulmonary:     Effort: Pulmonary effort is normal.  Musculoskeletal: Normal range of motion.  Skin:    General: Skin is warm and dry.  Neurological:     Mental Status: She is alert and oriented to person, place, and time.  Psychiatric:        Mood and Affect: Mood normal.        Behavior: Behavior normal.     RECENT LABS AND TESTS: BMET    Component  Value Date/Time   NA 140 10/16/2016 1853   NA 136 08/11/2016 1404   K 3.9 10/16/2016 1853   K 4.2 08/11/2016 1404   CL 107 10/16/2016 1853   CL 103 08/11/2016 1404   CO2 25 10/16/2016 1853   CO2 26 08/11/2016 1404   GLUCOSE 103 (H) 10/16/2016 1853   BUN 15 10/16/2016 1853   BUN 13 08/11/2016 1404   CREATININE 0.85 10/16/2016 1853   CREATININE 0.87 08/11/2016 1404   CALCIUM 9.1 10/16/2016 1853   CALCIUM 9.5 08/11/2016 1404   GFRNONAA >60 10/16/2016 1853   GFRAA >60 10/16/2016 1853   Lab Results  Component Value Date   HGBA1C 5.0 05/27/2018   HGBA1C 5.1 05/22/2017   HGBA1C 5.1 05/16/2016  HGBA1C 5.0 04/07/2015   Lab Results  Component Value Date   INSULIN 8.9 06/13/2018   CBC    Component Value Date/Time   WBC 9.3 05/03/2018 1408   WBC 8.2 10/16/2016 1853   RBC 4.85 05/03/2018 1408   HGB 14.3 05/03/2018 1408   HGB 14.2 04/20/2017 1524   HCT 45.4 05/03/2018 1408   HCT 43.2 04/20/2017 1524   PLT 195 05/03/2018 1408   PLT 199 04/20/2017 1524   MCV 93.6 05/03/2018 1408   MCV 90 04/20/2017 1524   MCH 29.5 05/03/2018 1408   MCHC 31.5 05/03/2018 1408   RDW 12.9 05/03/2018 1408   RDW 13.5 04/20/2017 1524   LYMPHSABS 1.8 05/03/2018 1408   LYMPHSABS 1.6 04/20/2017 1524   MONOABS 0.7 05/03/2018 1408   EOSABS 0.5 05/03/2018 1408   EOSABS 0.4 04/20/2017 1524   BASOSABS 0.0 05/03/2018 1408   BASOSABS 0.1 04/20/2017 1524   Iron/TIBC/Ferritin/ %Sat    Component Value Date/Time   IRON 91 05/03/2018 1408   IRON 103 04/20/2017 1524   TIBC 383 05/03/2018 1408   TIBC 450 (H) 04/20/2017 1524   FERRITIN 60 05/03/2018 1408   FERRITIN 23 04/20/2017 1524   IRONPCTSAT 24 05/03/2018 1408   IRONPCTSAT 23 04/20/2017 1524   IRONPCTSAT 18 (L) 10/30/2014 1213   Lipid Panel     Component Value Date/Time   CHOL 119 05/27/2018 0848   TRIG 52.0 05/27/2018 0848   HDL 50.60 05/27/2018 0848   CHOLHDL 2 05/27/2018 0848   VLDL 10.4 05/27/2018 0848   LDLCALC 58 05/27/2018 0848    Hepatic Function Panel     Component Value Date/Time   PROT 7.3 08/11/2016 1404   ALBUMIN 4.2 08/11/2016 1404   AST 9 08/11/2016 1404   ALT 8 08/11/2016 1404   ALKPHOS 58 08/11/2016 1404   BILITOT 0.5 08/11/2016 1404   BILIDIR 0.0 06/24/2012 1055      Component Value Date/Time   TSH 2.250 06/13/2018 1123   TSH 1.28 01/26/2016 1358   TSH 2.46 04/28/2014 0957     Ref. Range 06/13/2018 11:23  Vitamin D, 25-Hydroxy Latest Ref Range: 30.0 - 100.0 ng/mL 40.4     OBESITY BEHAVIORAL INTERVENTION VISIT  Today's visit was # 2   Starting weight: 205 lbs Starting date: 06/13/2018 Today's weight : 203 lbs  Today's date: 06/27/2018 Total lbs lost to date: 2    06/27/2018  Height 5\' 6"  (1.676 m)  Weight 203 lb (92.1 kg)  BMI (Calculated) 32.78  BLOOD PRESSURE - SYSTOLIC 440  BLOOD PRESSURE - DIASTOLIC 73   Body Fat % 10.2 %  Total Body Water (lbs) 79.8 lbs    ASK: We discussed the diagnosis of obesity with Tamara Ball today and Tamara Ball agreed to give [name] permission to discuss obesity behavioral modification therapy today.  ASSESS: Tamara Ball has the diagnosis of obesity and her BMI today is 32.78 Tamara Ball is in the action stage of change   ADVISE: Tamara Ball was educated on the multiple health risks of obesity as well as the benefit of weight loss to improve her health. She was advised of the need for long term treatment and the importance of lifestyle modifications to improve her current health and to decrease her risk of future health problems.  AGREE: Multiple dietary modification options and treatment options were discussed and  [place]ine agreed to follow the recommendations documented in the above note.  ARRANGE: Tamara Ball was educated on the importance of frequent visits to treat obesity as  outlined per CMS and USPSTF guidelines and agreed to schedule her next follow up appointment today.  Corey Skains, am acting as Location manager for [employer]. Owens Shark, DO  I have reviewed the  above documentation for accuracy and completeness, and I agree with the above. -Jearld Lesch, DO

## 2018-07-02 ENCOUNTER — Encounter: Payer: Self-pay | Admitting: Family Medicine

## 2018-07-02 MED ORDER — LEVOCETIRIZINE DIHYDROCHLORIDE 5 MG PO TABS: 5.0000 mg | ORAL_TABLET | Freq: Every evening | ORAL | 3 refills | Status: DC

## 2018-07-02 NOTE — Telephone Encounter (Signed)
Rx done and pt informed via Mychart message.

## 2018-07-02 NOTE — Telephone Encounter (Signed)
ok 

## 2018-07-09 ENCOUNTER — Ambulatory Visit: Payer: Self-pay

## 2018-07-09 ENCOUNTER — Encounter: Payer: Self-pay | Admitting: Family Medicine

## 2018-07-09 NOTE — Telephone Encounter (Signed)
Patient called in with c/o "sore throat." She says "my throat got sore last Friday. It hurts, but I still manage to swallow foods." I asked about strep exposure, she says "I have a co-worker who was diagnosed with strep 1 week before my throat became sore. 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 I have a cough, body aches and headache." According to protocol, see PCP within 3 days, patient requests to be seen by any provider on tomorrow. No availability with PCP, appointment scheduled for tomorrow, 07/10/18 at 0730 with Dr. Elease Hashimoto, care advice given, patient verbalized understanding.   Reason for Disposition . [1] Sore throat with cough/cold symptoms AND [2] present > 5 days  Answer Assessment - Initial Assessment Questions 1. ONSET: "When did the throat start hurting?" (Hours or days ago)      Last Friday 2. SEVERITY: "How bad is the sore throat?" (Scale 1-10; mild, moderate or severe)   - MILD (1-3):  doesn't interfere with eating or normal activities   - MODERATE (4-7): interferes with eating some solids and normal activities   - SEVERE (8-10):  excruciating pain, interferes with most normal activities   - SEVERE DYSPHAGIA: can't swallow liquids, drooling     Mild-moderate 3. STREP EXPOSURE: "Has there been any exposure to strep within the past week?" If so, ask: "What type of contact occurred?"      Co-worker had strep 1 week before your sore throat 4.  VIRAL SYMPTOMS: "Are there any symptoms of a cold, such as a runny nose, cough, hoarse voice or red eyes?"      Yes-cough, body aches 5. FEVER: "Do you have a fever?" If so, ask: "What is your temperature, how was it measured, and when did it start?"     No 6. PUS ON THE TONSILS: "Is there pus on the tonsils in the back of your throat?"     No 7. OTHER SYMPTOMS: "Do you have any other symptoms?" (e.g., difficulty breathing, headache, rash)     Cough, body aches,  headache 8. PREGNANCY: "Is there any chance you are pregnant?" "When was your last menstrual period?"     No; cycle on now  Protocols used: SORE THROAT-A-AH

## 2018-07-10 ENCOUNTER — Other Ambulatory Visit: Payer: Self-pay

## 2018-07-10 ENCOUNTER — Ambulatory Visit: Payer: Self-pay | Admitting: Family Medicine

## 2018-07-10 ENCOUNTER — Ambulatory Visit: Payer: BC Managed Care – PPO | Admitting: Family Medicine

## 2018-07-10 ENCOUNTER — Encounter: Payer: Self-pay | Admitting: Family Medicine

## 2018-07-10 VITALS — BP 112/78 | HR 88 | Temp 99.0°F | Ht 66.0 in | Wt 207.6 lb

## 2018-07-10 DIAGNOSIS — R52 Pain, unspecified: Secondary | ICD-10-CM

## 2018-07-10 DIAGNOSIS — B349 Viral infection, unspecified: Secondary | ICD-10-CM

## 2018-07-10 LAB — POCT INFLUENZA A/B
Influenza A, POC: NEGATIVE
Influenza B, POC: NEGATIVE

## 2018-07-10 NOTE — Patient Instructions (Signed)
Sinusitis, Adult  Sinusitis is inflammation of your sinuses. Sinuses are hollow spaces in the bones around your face. Your sinuses are located:   Around your eyes.   In the middle of your forehead.   Behind your nose.   In your cheekbones.  Mucus normally drains out of your sinuses. When your nasal tissues become inflamed or swollen, mucus can become trapped or blocked. This allows bacteria, viruses, and fungi to grow, which leads to infection. Most infections of the sinuses are caused by a virus.  Sinusitis can develop quickly. It can last for up to 4 weeks (acute) or for more than 12 weeks (chronic). Sinusitis often develops after a cold.  What are the causes?  This condition is caused by anything that creates swelling in the sinuses or stops mucus from draining. This includes:   Allergies.   Asthma.   Infection from bacteria or viruses.   Deformities or blockages in your nose or sinuses.   Abnormal growths in the nose (nasal polyps).   Pollutants, such as chemicals or irritants in the air.   Infection from fungi (rare).  What increases the risk?  You are more likely to develop this condition if you:   Have a weak body defense system (immune system).   Do a lot of swimming or diving.   Overuse nasal sprays.   Smoke.  What are the signs or symptoms?  The main symptoms of this condition are pain and a feeling of pressure around the affected sinuses. Other symptoms include:   Stuffy nose or congestion.   Thick drainage from your nose.   Swelling and warmth over the affected sinuses.   Headache.   Upper toothache.   A cough that may get worse at night.   Extra mucus that collects in the throat or the back of the nose (postnasal drip).   Decreased sense of smell and taste.   Fatigue.   A fever.   Sore throat.   Bad breath.  How is this diagnosed?  This condition is diagnosed based on:   Your symptoms.   Your medical history.   A physical exam.   Tests to find out if your condition is  acute or chronic. This may include:  ? Checking your nose for nasal polyps.  ? Viewing your sinuses using a device that has a light (endoscope).  ? Testing for allergies or bacteria.  ? Imaging tests, such as an MRI or CT scan.  In rare cases, a bone biopsy may be done to rule out more serious types of fungal sinus disease.  How is this treated?  Treatment for sinusitis depends on the cause and whether your condition is chronic or acute.   If caused by a virus, your symptoms should go away on their own within 10 days. You may be given medicines to relieve symptoms. They include:  ? Medicines that shrink swollen nasal passages (topical intranasal decongestants).  ? Medicines that treat allergies (antihistamines).  ? A spray that eases inflammation of the nostrils (topical intranasal corticosteroids).  ? Rinses that help get rid of thick mucus in your nose (nasal saline washes).   If caused by bacteria, your health care provider may recommend waiting to see if your symptoms improve. Most bacterial infections will get better without antibiotic medicine. You may be given antibiotics if you have:  ? A severe infection.  ? A weak immune system.   If caused by narrow nasal passages or nasal polyps, you may need   to have surgery.  Follow these instructions at home:  Medicines   Take, use, or apply over-the-counter and prescription medicines only as told by your health care provider. These may include nasal sprays.   If you were prescribed an antibiotic medicine, take it as told by your health care provider. Do not stop taking the antibiotic even if you start to feel better.  Hydrate and humidify     Drink enough fluid to keep your urine pale yellow. Staying hydrated will help to thin your mucus.   Use a cool mist humidifier to keep the humidity level in your home above 50%.   Inhale steam for 10-15 minutes, 3-4 times a day, or as told by your health care provider. You can do this in the bathroom while a hot shower is  running.   Limit your exposure to cool or dry air.  Rest   Rest as much as possible.   Sleep with your head raised (elevated).   Make sure you get enough sleep each night.  General instructions     Apply a warm, moist washcloth to your face 3-4 times a day or as told by your health care provider. This will help with discomfort.   Wash your hands often with soap and water to reduce your exposure to germs. If soap and water are not available, use hand sanitizer.   Do not smoke. Avoid being around people who are smoking (secondhand smoke).   Keep all follow-up visits as told by your health care provider. This is important.  Contact a health care provider if:   You have a fever.   Your symptoms get worse.   Your symptoms do not improve within 10 days.  Get help right away if:   You have a severe headache.   You have persistent vomiting.   You have severe pain or swelling around your face or eyes.   You have vision problems.   You develop confusion.   Your neck is stiff.   You have trouble breathing.  Summary   Sinusitis is soreness and inflammation of your sinuses. Sinuses are hollow spaces in the bones around your face.   This condition is caused by nasal tissues that become inflamed or swollen. The swelling traps or blocks the flow of mucus. This allows bacteria, viruses, and fungi to grow, which leads to infection.   If you were prescribed an antibiotic medicine, take it as told by your health care provider. Do not stop taking the antibiotic even if you start to feel better.   Keep all follow-up visits as told by your health care provider. This is important.  This information is not intended to replace advice given to you by your health care provider. Make sure you discuss any questions you have with your health care provider.  Document Released: 04/24/2005 Document Revised: 09/24/2017 Document Reviewed: 09/24/2017  Elsevier Interactive Patient Education  2019 [employer]

## 2018-07-10 NOTE — Progress Notes (Signed)
  Subjective:     Patient ID: Tamara Ball, female   DOB: 06-03-1980, 38 y.o.   MRN: [id number]  HPI Patient is seen with symptoms of sore throat, body aches, cough, nasal congestion.  She had onset of symptoms on Friday and then felt little better over the weekend but then seemed to relapse on Monday.  She had some sweats but no documented fever.  Mild cough.  Moderate sore throat.  Took some ibuprofen with some symptom relief.  No recent travels.  No known sick exposures.  Past Medical History:  Diagnosis Date  . Anemia   . Anxiety   . Chicken pox   . Chronic headaches   . Constipation   . Fibroids   . Iron deficiency anemia due to chronic blood loss    from heavy periods  . Menorrhagia   . Palpitations    History reviewed. No pertinent surgical history.  reports that she has never smoked. She has never used smokeless tobacco. She reports current alcohol use. She reports that she does not use drugs. family history includes Cancer in her mother; Diabetes in her maternal grandfather; Hypertension in her maternal uncle and mother. No Known Allergies   Review of Systems  Constitutional: Positive for chills and fatigue.  HENT: Positive for congestion.   Respiratory: Positive for cough.   Gastrointestinal: Negative for nausea and vomiting.  Musculoskeletal: Positive for myalgias.       Objective:   Physical Exam Constitutional:      Appearance: She is well-developed.  HENT:     Right Ear: Tympanic membrane normal.     Left Ear: Tympanic membrane normal.     Mouth/Throat:     Mouth: Mucous membranes are moist.     Pharynx: Oropharynx is clear. No oropharyngeal exudate or posterior oropharyngeal erythema.  Neck:     Musculoskeletal: Neck supple.  Cardiovascular:     Rate and Rhythm: Normal rate and regular rhythm.  Pulmonary:     Effort: Pulmonary effort is normal.     Breath sounds: Normal breath sounds. No wheezing or rales.  Lymphadenopathy:     Cervical: No cervical  adenopathy.  Neurological:     Mental Status: She is alert.        Assessment:     Probable viral URI with cough.  Patient is nontoxic in appearance.  Influenza screen negative.    Plan:     -Plenty of fluids and rest -Over-the-counter Tylenol or Motrin as needed for body aches and sore throat symptoms -Follow-up promptly for any recurrent fever or other concerns  Eulas Post MD [facility]

## 2018-07-16 ENCOUNTER — Other Ambulatory Visit: Payer: Self-pay

## 2018-07-16 ENCOUNTER — Encounter (INDEPENDENT_AMBULATORY_CARE_PROVIDER_SITE_OTHER): Payer: Self-pay | Admitting: Bariatrics

## 2018-07-16 ENCOUNTER — Ambulatory Visit (INDEPENDENT_AMBULATORY_CARE_PROVIDER_SITE_OTHER): Payer: BC Managed Care – PPO | Admitting: Bariatrics

## 2018-07-16 VITALS — BP 111/73 | HR 75 | Temp 97.8°F | Ht 66.0 in | Wt 199.0 lb

## 2018-07-16 DIAGNOSIS — E669 Obesity, unspecified: Secondary | ICD-10-CM | POA: Diagnosis not present

## 2018-07-16 DIAGNOSIS — Z6832 Body mass index (BMI) 32.0-32.9, adult: Secondary | ICD-10-CM | POA: Diagnosis not present

## 2018-07-16 DIAGNOSIS — K5909 Other constipation: Secondary | ICD-10-CM | POA: Diagnosis not present

## 2018-07-17 NOTE — Progress Notes (Signed)
Office: [phone]  /  Fax: [phone]   HPI:   Chief Complaint: OBESITY Tamara Ball is here to discuss her progress with her obesity treatment plan. She is on the Category 4 plan and is following her eating plan approximately 100% of the time. She states she is exercising 0 minutes 0 times per week. Tamara Ball is doing well overall. She still struggles with getting enough protein. Her weight is 199 lb (90.3 kg) today and has had a weight loss of 4 pounds over a period of 4 weeks since her last visit. She has lost 6 lbs since starting treatment with [name].  Constipation Tamara Ball notes constipation for the last few weeks, worse since attempting weight loss. She denies melena or hematochezia. 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  ASSESSMENT AND PLAN:  Other constipation  Class 1 obesity with serious comorbidity and body mass index (BMI) of 32.0 to 32.9 in adult, unspecified obesity type  PLAN:  Constipation Tamara Ball was informed decrease bowel movement frequency is normal while losing weight, but stools should not be hard or painful. She was advised to take MiraLax consistently, take an OTC laxative, increase her H20 intake, wheat bran and flax seed.   I spent > than 50% of the 15 minute visit on counseling as documented in the note.  Obesity Tamara Ball is currently in the action stage of change. As such, her goal is to continue with weight loss efforts. She has agreed to follow the Category 4 plan. Tamara Ball has been instructed to get back into the gym, "[employer]" elliptical and weights. We discussed the following Behavioral Modification Strategies today: increasing lean protein intake, decreasing simple carbohydrates, increasing vegetables, increase H20 intake, decrease eating out, no skipping meals, work on meal planning and easy cooking plans, and keeping healthy foods in the home.  Tamara Ball has agreed to follow-up with our clinic in 2 weeks. She was informed of the importance of frequent  follow-up visits to maximize her success with intensive lifestyle modifications for her multiple health conditions.  ALLERGIES: No Known Allergies  MEDICATIONS: Current Outpatient Medications on File Prior to Visit  Medication Sig Dispense Refill  . AVIANE 0.1-20 MG-MCG tablet     . b complex vitamins tablet Take 1 tablet by mouth daily.    . Biotin 10000 MCG TABS Take 1 tablet by mouth daily.    Marland Kitchen DYMISTA 137-50 MCG/ACT SUSP     . levocetirizine (XYZAL) 5 MG tablet Take 1 tablet (5 mg total) by mouth every evening. 30 tablet 3  . montelukast (SINGULAIR) 10 MG tablet Take 1 tablet (10 mg total) by mouth at bedtime. 30 tablet 3  . Multiple Vitamin (MULTIVITAMIN WITH MINERALS) TABS tablet Take 1 tablet by mouth daily.    . Omega-3 Fatty Acids (FISH OIL) 1000 MG CAPS Take 1 capsule by mouth daily.     No current facility-administered medications on file prior to visit.     PAST MEDICAL HISTORY: Past Medical History:  Diagnosis Date  . Anemia   . Anxiety   . Chicken pox   . Chronic headaches   . Constipation   . Fibroids   . Iron deficiency anemia due to chronic blood loss    from heavy periods  . Menorrhagia   . Palpitations     PAST SURGICAL HISTORY: History reviewed. No pertinent surgical history.  SOCIAL HISTORY: Social History   Tobacco Use  . Smoking status: Never Smoker  . Smokeless tobacco: Never Used  Substance Use Topics  .  Alcohol use: Yes    Alcohol/week: 0.0 standard drinks    Comment: social  . Drug use: No    FAMILY HISTORY: Family History  Problem Relation Age of Onset  . Hypertension Mother   . Cancer Mother   . Diabetes Maternal Grandfather   . Hypertension Maternal Uncle    ROS: Review of Systems  Constitutional: Positive for weight loss.  Gastrointestinal: Positive for constipation. Negative for melena.       Negative for hematochezia.   PHYSICAL EXAM: Blood pressure 111/73, pulse 75, temperature 97.8 F (36.6 C), temperature source  Oral, height 5\' 6"  (1.676 m), weight 199 lb (90.3 kg), SpO2 97 %. Body mass index is 32.12 kg/m. Physical Exam Vitals signs reviewed.  Constitutional:      Appearance: Normal appearance. She is obese.  Cardiovascular:     Rate and Rhythm: Normal rate.     Pulses: Normal pulses.  Pulmonary:     Effort: Pulmonary effort is normal.     Breath sounds: Normal breath sounds.  Musculoskeletal: Normal range of motion.  Skin:    General: Skin is warm and dry.  Neurological:     Mental Status: She is alert and oriented to person, place, and time.  Psychiatric:        Behavior: Behavior normal.   RECENT LABS AND TESTS: BMET    Component Value Date/Time   NA 140 10/16/2016 1853   NA 136 08/11/2016 1404   K 3.9 10/16/2016 1853   K 4.2 08/11/2016 1404   CL 107 10/16/2016 1853   CL 103 08/11/2016 1404   CO2 25 10/16/2016 1853   CO2 26 08/11/2016 1404   GLUCOSE 103 (H) 10/16/2016 1853   BUN 15 10/16/2016 1853   BUN 13 08/11/2016 1404   CREATININE 0.85 10/16/2016 1853   CREATININE 0.87 08/11/2016 1404   CALCIUM 9.1 10/16/2016 1853   CALCIUM 9.5 08/11/2016 1404   GFRNONAA >60 10/16/2016 1853   GFRAA >60 10/16/2016 1853   Lab Results  Component Value Date   HGBA1C 5.0 05/27/2018   HGBA1C 5.1 05/22/2017   HGBA1C 5.1 05/16/2016   HGBA1C 5.0 04/07/2015   Lab Results  Component Value Date   INSULIN 8.9 06/13/2018   CBC    Component Value Date/Time   WBC 9.3 05/03/2018 1408   WBC 8.2 10/16/2016 1853   RBC 4.85 05/03/2018 1408   HGB 14.3 05/03/2018 1408   HGB 14.2 04/20/2017 1524   HCT 45.4 05/03/2018 1408   HCT 43.2 04/20/2017 1524   PLT 195 05/03/2018 1408   PLT 199 04/20/2017 1524   MCV 93.6 05/03/2018 1408   MCV 90 04/20/2017 1524   MCH 29.5 05/03/2018 1408   MCHC 31.5 05/03/2018 1408   RDW 12.9 05/03/2018 1408   RDW 13.5 04/20/2017 1524   LYMPHSABS 1.8 05/03/2018 1408   LYMPHSABS 1.6 04/20/2017 1524   MONOABS 0.7 05/03/2018 1408   EOSABS 0.5 05/03/2018 1408    EOSABS 0.4 04/20/2017 1524   BASOSABS 0.0 05/03/2018 1408   BASOSABS 0.1 04/20/2017 1524   Iron/TIBC/Ferritin/ %Sat    Component Value Date/Time   IRON 91 05/03/2018 1408   IRON 103 04/20/2017 1524   TIBC 383 05/03/2018 1408   TIBC 450 (H) 04/20/2017 1524   FERRITIN 60 05/03/2018 1408   FERRITIN 23 04/20/2017 1524   IRONPCTSAT 24 05/03/2018 1408   IRONPCTSAT 23 04/20/2017 1524   IRONPCTSAT 18 (L) 10/30/2014 1213   Lipid Panel     Component Value Date/Time  CHOL 119 05/27/2018 0848   TRIG 52.0 05/27/2018 0848   HDL 50.60 05/27/2018 0848   CHOLHDL 2 05/27/2018 0848   VLDL 10.4 05/27/2018 0848   LDLCALC 58 05/27/2018 0848   Hepatic Function Panel     Component Value Date/Time   PROT 7.3 08/11/2016 1404   ALBUMIN 4.2 08/11/2016 1404   AST 9 08/11/2016 1404   ALT 8 08/11/2016 1404   ALKPHOS 58 08/11/2016 1404   BILITOT 0.5 08/11/2016 1404   BILIDIR 0.0 06/24/2012 1055      Component Value Date/Time   TSH 2.250 06/13/2018 1123   TSH 1.28 01/26/2016 1358   TSH 2.46 04/28/2014 0957   Results for SHIRLETTE, SCARBER (MRN [id number]) as of 07/17/2018 09:08  Ref. Range 06/13/2018 11:23  Vitamin D, 25-Hydroxy Latest Ref Range: 30.0 - 100.0 ng/mL 40.4   OBESITY BEHAVIORAL INTERVENTION VISIT  Today's visit was #3  Starting weight: 205 lbs Starting date: 06/13/2018 Today's weight: 199 lbs Today's date: 07/16/2018 Total lbs lost to date: 6    07/16/2018  Height 5\' 6"  (1.676 m)  Weight 199 lb (90.3 kg)  BMI (Calculated) 32.13  BLOOD PRESSURE - SYSTOLIC 109  BLOOD PRESSURE - DIASTOLIC 73   Body Fat % 32.3 %  Total Body Water (lbs) 78.4 lbs   ASK: We discussed the diagnosis of obesity with Mayo Ao today and [name] agreed to give [name] permission to discuss obesity behavioral modification therapy today.  ASSESS: Lakeshia has the diagnosis of obesity and her BMI today is 32.13. Ahri is in the action stage of change.   ADVISE: Anniece was educated on the multiple health  risks of obesity as well as the benefit of weight loss to improve her health. She was advised of the need for long term treatment and the importance of lifestyle modifications to improve her current health and to decrease her risk of future health problems.  AGREE: Multiple dietary modification options and treatment options were discussed and  Kadedra agreed to follow the recommendations documented in the above note.  ARRANGE: Yeny was educated on the importance of frequent visits to treat obesity as outlined per CMS and USPSTF guidelines and agreed to schedule her next follow up appointment today.  Migdalia Dk, am acting as Location manager for [employer], DO  I have reviewed the above documentation for accuracy and completeness, and I agree with the above. -Jearld Lesch, DO

## 2018-07-18 ENCOUNTER — Encounter: Payer: Self-pay | Admitting: Family Medicine

## 2018-07-22 NOTE — Telephone Encounter (Signed)
I called the pt and she stated she is not having any symptoms and went back to work.

## 2018-07-31 ENCOUNTER — Encounter (INDEPENDENT_AMBULATORY_CARE_PROVIDER_SITE_OTHER): Payer: Self-pay

## 2018-08-03 ENCOUNTER — Encounter (INDEPENDENT_AMBULATORY_CARE_PROVIDER_SITE_OTHER): Payer: Self-pay | Admitting: Bariatrics

## 2018-08-03 NOTE — Telephone Encounter (Signed)
Forward

## 2018-08-05 ENCOUNTER — Ambulatory Visit (INDEPENDENT_AMBULATORY_CARE_PROVIDER_SITE_OTHER): Payer: BC Managed Care – PPO | Admitting: Bariatrics

## 2018-09-25 ENCOUNTER — Other Ambulatory Visit: Payer: Self-pay

## 2018-09-25 ENCOUNTER — Ambulatory Visit (INDEPENDENT_AMBULATORY_CARE_PROVIDER_SITE_OTHER): Payer: BC Managed Care – PPO | Admitting: Bariatrics

## 2018-09-25 ENCOUNTER — Encounter (INDEPENDENT_AMBULATORY_CARE_PROVIDER_SITE_OTHER): Payer: Self-pay | Admitting: Bariatrics

## 2018-09-25 DIAGNOSIS — E669 Obesity, unspecified: Secondary | ICD-10-CM | POA: Diagnosis not present

## 2018-09-25 DIAGNOSIS — Z6832 Body mass index (BMI) 32.0-32.9, adult: Secondary | ICD-10-CM | POA: Diagnosis not present

## 2018-09-25 DIAGNOSIS — E559 Vitamin D deficiency, unspecified: Secondary | ICD-10-CM

## 2018-09-25 NOTE — Progress Notes (Signed)
Office: [phone]  /  Fax: [phone] TeleHealth Visit:  Mayo Ao has verbally consented to this TeleHealth visit today. The patient is located at home, the provider is located at the [facility] office. The participants in this visit include the listed provider and patient and any and all parties involved. The visit was conducted today via FaceTime.  HPI:   Chief Complaint: OBESITY Tamara Ball is here to discuss her progress with her obesity treatment plan. She is on the Category 4 plan and is following her eating plan approximately 70 % of the time. She states she is walking 3 to 4 miles 3 to 4 times per week. Tamara Ball states that she is unsure if she has lost weight, but thinks maybe 1 to 3 pounds. She has not been doing well (lowest weight 192 pounds). We were unable to weigh the patient today for this TeleHealth visit. She feels as if she has lost weight since her last visit. She has lost 9 lbs since starting treatment with [name].  Vitamin D deficiency Tamara Ball has a diagnosis of vitamin D deficiency. She is taking OTC vit D and denies nausea, vomiting or muscle weakness.  ASSESSMENT AND PLAN:  Vitamin D deficiency  Class 1 obesity with serious comorbidity and body mass index (BMI) of 32.0 to 32.9 in adult, unspecified obesity type  PLAN:  Vitamin D Deficiency Tamara Ball was informed that low vitamin D levels contributes to fatigue and are associated with obesity, breast, and colon cancer. She will continue to take OTC vitamin D and will follow up for routine testing of vitamin D, at least 2-3 times per year. She was informed of the risk of over-replacement of vitamin D and agrees to not increase her dose unless she discusses this with [name] first.  Obesity Tamara Ball is currently in the action stage of change. As such, her goal is to continue with weight loss efforts She has agreed to follow the Category 4 plan Tamara Ball will continue her exercise regimen for weight loss and  overall health benefits. We discussed the following Behavioral Modification Strategies today: increase H2O intake, no skipping meals, keeping healthy foods in the home, increasing lean protein intake, decreasing simple carbohydrates, increasing vegetables, decrease eating out and work on meal planning and intentional eating.  Tamara Ball has agreed to follow up with our clinic in 2 weeks. She was informed of the importance of frequent follow up visits to maximize her success with intensive lifestyle modifications for her multiple health conditions.  ALLERGIES: No Known Allergies  MEDICATIONS: Current Outpatient Medications on File Prior to Visit  Medication Sig Dispense Refill  . AVIANE 0.1-20 MG-MCG tablet     . b complex vitamins tablet Take 1 tablet by mouth daily.    . Biotin 10000 MCG TABS Take 1 tablet by mouth daily.    Marland Kitchen DYMISTA 137-50 MCG/ACT SUSP     . levocetirizine (XYZAL) 5 MG tablet Take 1 tablet (5 mg total) by mouth every evening. 30 tablet 3  . montelukast (SINGULAIR) 10 MG tablet Take 1 tablet (10 mg total) by mouth at bedtime. 30 tablet 3  . Multiple Vitamin (MULTIVITAMIN WITH MINERALS) TABS tablet Take 1 tablet by mouth daily.    . Omega-3 Fatty Acids (FISH OIL) 1000 MG CAPS Take 1 capsule by mouth daily.     No current facility-administered medications on file prior to visit.     PAST MEDICAL HISTORY: Past Medical History:  Diagnosis Date  . Anemia   .  Anxiety   . Chicken pox   . Chronic headaches   . Constipation   . Fibroids   . Iron deficiency anemia due to chronic blood loss    from heavy periods  . Menorrhagia   . Palpitations     PAST SURGICAL HISTORY: History reviewed. No pertinent surgical history.  SOCIAL HISTORY: Social History   Tobacco Use  . Smoking status: Never Smoker  . Smokeless tobacco: Never Used  Substance Use Topics  . Alcohol use: Yes    Alcohol/week: 0.0 standard drinks    Comment: social  . Drug use: No    FAMILY HISTORY:  Family History  Problem Relation Age of Onset  . Hypertension Mother   . Cancer Mother   . Diabetes Maternal Grandfather   . Hypertension Maternal Uncle     ROS: Review of Systems  Constitutional: Positive for weight loss.  Gastrointestinal: Negative for nausea and vomiting.  Musculoskeletal:       Negative for muscle weakness    PHYSICAL EXAM: Pt in no acute distress  RECENT LABS AND TESTS: BMET    Component Value Date/Time   NA 140 10/16/2016 1853   NA 136 08/11/2016 1404   K 3.9 10/16/2016 1853   K 4.2 08/11/2016 1404   CL 107 10/16/2016 1853   CL 103 08/11/2016 1404   CO2 25 10/16/2016 1853   CO2 26 08/11/2016 1404   GLUCOSE 103 (H) 10/16/2016 1853   BUN 15 10/16/2016 1853   BUN 13 08/11/2016 1404   CREATININE 0.85 10/16/2016 1853   CREATININE 0.87 08/11/2016 1404   CALCIUM 9.1 10/16/2016 1853   CALCIUM 9.5 08/11/2016 1404   GFRNONAA >60 10/16/2016 1853   GFRAA >60 10/16/2016 1853   Lab Results  Component Value Date   HGBA1C 5.0 05/27/2018   HGBA1C 5.1 05/22/2017   HGBA1C 5.1 05/16/2016   HGBA1C 5.0 04/07/2015   Lab Results  Component Value Date   INSULIN 8.9 06/13/2018   CBC    Component Value Date/Time   WBC 9.3 05/03/2018 1408   WBC 8.2 10/16/2016 1853   RBC 4.85 05/03/2018 1408   HGB 14.3 05/03/2018 1408   HGB 14.2 04/20/2017 1524   HCT 45.4 05/03/2018 1408   HCT 43.2 04/20/2017 1524   PLT 195 05/03/2018 1408   PLT 199 04/20/2017 1524   MCV 93.6 05/03/2018 1408   MCV 90 04/20/2017 1524   MCH 29.5 05/03/2018 1408   MCHC 31.5 05/03/2018 1408   RDW 12.9 05/03/2018 1408   RDW 13.5 04/20/2017 1524   LYMPHSABS 1.8 05/03/2018 1408   LYMPHSABS 1.6 04/20/2017 1524   MONOABS 0.7 05/03/2018 1408   EOSABS 0.5 05/03/2018 1408   EOSABS 0.4 04/20/2017 1524   BASOSABS 0.0 05/03/2018 1408   BASOSABS 0.1 04/20/2017 1524   Iron/TIBC/Ferritin/ %Sat    Component Value Date/Time   IRON 91 05/03/2018 1408   IRON 103 04/20/2017 1524   TIBC 383  05/03/2018 1408   TIBC 450 (H) 04/20/2017 1524   FERRITIN 60 05/03/2018 1408   FERRITIN 23 04/20/2017 1524   IRONPCTSAT 24 05/03/2018 1408   IRONPCTSAT 23 04/20/2017 1524   IRONPCTSAT 18 (L) 10/30/2014 1213   Lipid Panel     Component Value Date/Time   CHOL 119 05/27/2018 0848   TRIG 52.0 05/27/2018 0848   HDL 50.60 05/27/2018 0848   CHOLHDL 2 05/27/2018 0848   VLDL 10.4 05/27/2018 0848   LDLCALC 58 05/27/2018 0848   Hepatic Function Panel  Component Value Date/Time   PROT 7.3 08/11/2016 1404   ALBUMIN 4.2 08/11/2016 1404   AST 9 08/11/2016 1404   ALT 8 08/11/2016 1404   ALKPHOS 58 08/11/2016 1404   BILITOT 0.5 08/11/2016 1404   BILIDIR 0.0 06/24/2012 1055      Component Value Date/Time   TSH 2.250 06/13/2018 1123   TSH 1.28 01/26/2016 1358   TSH 2.46 04/28/2014 0957     Ref. Range 06/13/2018 11:23  Vitamin D, 25-Hydroxy Latest Ref Range: 30.0 - 100.0 ng/mL 40.4    I, Doreene Nest, am acting as Location manager for [employer]. Owens Shark, DO  I have reviewed the above documentation for accuracy and completeness, and I agree with the above. -Jearld Lesch, DO

## 2018-10-01 ENCOUNTER — Encounter (INDEPENDENT_AMBULATORY_CARE_PROVIDER_SITE_OTHER): Payer: Self-pay | Admitting: Bariatrics

## 2018-10-01 ENCOUNTER — Encounter: Payer: Self-pay | Admitting: Family Medicine

## 2018-10-01 MED ORDER — MONTELUKAST SODIUM 10 MG PO TABS: 10.0000 mg | ORAL_TABLET | Freq: Every day | ORAL | 3 refills | Status: DC

## 2018-10-09 ENCOUNTER — Ambulatory Visit (INDEPENDENT_AMBULATORY_CARE_PROVIDER_SITE_OTHER): Payer: BC Managed Care – PPO | Admitting: Bariatrics

## 2018-10-09 ENCOUNTER — Other Ambulatory Visit: Payer: Self-pay

## 2018-10-09 ENCOUNTER — Encounter (INDEPENDENT_AMBULATORY_CARE_PROVIDER_SITE_OTHER): Payer: Self-pay | Admitting: Bariatrics

## 2018-10-09 VITALS — BP 101/67 | HR 78 | Temp 98.1°F | Ht 66.0 in | Wt 196.0 lb

## 2018-10-09 DIAGNOSIS — Z9189 Other specified personal risk factors, not elsewhere classified: Secondary | ICD-10-CM | POA: Diagnosis not present

## 2018-10-09 DIAGNOSIS — E669 Obesity, unspecified: Secondary | ICD-10-CM | POA: Diagnosis not present

## 2018-10-09 DIAGNOSIS — D508 Other iron deficiency anemias: Secondary | ICD-10-CM | POA: Diagnosis not present

## 2018-10-09 DIAGNOSIS — E559 Vitamin D deficiency, unspecified: Secondary | ICD-10-CM | POA: Diagnosis not present

## 2018-10-09 DIAGNOSIS — Z6831 Body mass index (BMI) 31.0-31.9, adult: Secondary | ICD-10-CM

## 2018-10-09 MED ORDER — INSULIN PEN NEEDLE 32G X 4 MM MISC: 1.0000 | Freq: Two times a day (BID) | 0 refills | Status: DC

## 2018-10-09 MED ORDER — LIRAGLUTIDE -WEIGHT MANAGEMENT 18 MG/3ML ~~LOC~~ SOPN: 3.0000 mg | PEN_INJECTOR | Freq: Every day | SUBCUTANEOUS | 0 refills | Status: DC

## 2018-10-10 ENCOUNTER — Encounter (INDEPENDENT_AMBULATORY_CARE_PROVIDER_SITE_OTHER): Payer: Self-pay

## 2018-10-10 NOTE — Progress Notes (Signed)
Office: [phone]  /  Fax: [phone]   HPI:   Chief Complaint: OBESITY Tamara Ball is here to discuss her progress with her obesity treatment plan. She is on the Category 4 plan and is following her eating plan approximately 75 to 80 % of the time. She states she is walking 6 miles 1 to 4 times per week. Tamara Ball is ready to get back on track. She struggled some during the COVID-19 epidemic. She is doing better with the meal plan. She is struggling with "eating out". Her weight is 196 lb (88.9 kg) today and has had a weight loss of 3 pounds since her last visit in the office. She has lost 9 lbs since starting treatment with [name].  Vitamin D deficiency Tamara Ball has a diagnosis of vitamin D deficiency. Her last vitamin D level was at 40.4 She is currently taking OTC vit D and denies nausea, vomiting or muscle weakness.  At risk for osteopenia and osteoporosis Tamara Ball is at higher risk of osteopenia and osteoporosis due to vitamin D deficiency.   Iron Deficiency Anemia, history of menorrhagia Tamara Ball has a diagnosis of iron deficiency anemia with a history of menorrhagia.  She is taking birth control pills and a multi-vitamin.  ASSESSMENT AND PLAN:  Vitamin D deficiency  Other iron deficiency anemia  At risk for osteoporosis  Class 1 obesity with serious comorbidity and body mass index (BMI) of 31.0 to 31.9 in adult, unspecified obesity type - Plan: Liraglutide -Weight Management (SAXENDA) 18 MG/3ML SOPN, Insulin Pen Needle (BD PEN NEEDLE NANO 2ND GEN) 32G X 4 MM MISC  PLAN:  Vitamin D Deficiency Tamara Ball was informed that low vitamin D levels contributes to fatigue and are associated with obesity, breast, and colon cancer. She will continue to take OTC Vitamin D and will follow up for routine testing of vitamin D, at least 2-3 times per year. She was informed of the risk of over-replacement of vitamin D and agrees to not increase her dose unless she discusses this with [name] first.  At risk  for osteopenia and osteoporosis Tamara Ball was given extended  (15 minutes) osteoporosis prevention counseling today. Tamara Ball is at risk for osteopenia and osteoporosis due to her vitamin D deficiency. She was encouraged to take her vitamin D and follow her higher calcium diet and increase strengthening exercise to help strengthen her bones and decrease her risk of osteopenia and osteoporosis.  Iron Deficiency Anemia, history of menorrhagia The diagnosis of Iron deficiency anemia was discussed with Tamara Ball and was explained in detail. She was given suggestions of iron rich foods. She will continue to follow up with her PCP. She will continue her medications and multi-vitamin and will follow up as directed.  Obesity Tamara Ball is currently in the action stage of change. As such, her goal is to continue with weight loss efforts She has agreed to keep a food journal with 550 to 650 calories and 40+ grams of protein daily and the Category 4 plan Tamara Ball has been instructed to work up to a goal of 150 minutes of combined cardio and strengthening exercise per week for weight loss and overall health benefits. We discussed the following Behavioral Modification Strategies today: planning for success, increase H2O intake, no skipping meals, keeping healthy foods in the home, increasing lean protein intake (protein snacks), decreasing simple carbohydrates, increasing vegetables, decrease eating out, work on meal planning and easy cooking plans and ways to avoid boredom eating Tamara Ball will be more consistent with the plan. She will decrease "  eating out". Tamara Ball will use "MyFitnessPal". We discussed various medication options to help Tamara Ball with her weight loss efforts and we both agreed to start Saxenda (0.6 mg) subQ daily #5 pens with no refills and pen needles #100 with no refills.  Tamara Ball has agreed to follow up with our clinic in 2 weeks. She was informed of the importance of frequent follow up visits to maximize her  success with intensive lifestyle modifications for her multiple health conditions.  ALLERGIES: No Known Allergies  MEDICATIONS: Current Outpatient Medications on File Prior to Visit  Medication Sig Dispense Refill  . AVIANE 0.1-20 MG-MCG tablet     . b complex vitamins tablet Take 1 tablet by mouth daily.    . Biotin 10000 MCG TABS Take 1 tablet by mouth daily.    Marland Kitchen DYMISTA 137-50 MCG/ACT SUSP     . levocetirizine (XYZAL) 5 MG tablet Take 1 tablet (5 mg total) by mouth every evening. 30 tablet 3  . montelukast (SINGULAIR) 10 MG tablet Take 1 tablet (10 mg total) by mouth at bedtime. 30 tablet 3  . Multiple Vitamin (MULTIVITAMIN WITH MINERALS) TABS tablet Take 1 tablet by mouth daily.    . Omega-3 Fatty Acids (FISH OIL) 1000 MG CAPS Take 1 capsule by mouth daily.     No current facility-administered medications on file prior to visit.     PAST MEDICAL HISTORY: Past Medical History:  Diagnosis Date  . Anemia   . Anxiety   . Chicken pox   . Chronic headaches   . Constipation   . Fibroids   . Iron deficiency anemia due to chronic blood loss    from heavy periods  . Menorrhagia   . Palpitations     PAST SURGICAL HISTORY: No past surgical history on file.  SOCIAL HISTORY: Social History   Tobacco Use  . Smoking status: Never Smoker  . Smokeless tobacco: Never Used  Substance Use Topics  . Alcohol use: Yes    Alcohol/week: 0.0 standard drinks    Comment: social  . Drug use: No    FAMILY HISTORY: Family History  Problem Relation Age of Onset  . Hypertension Mother   . Cancer Mother   . Diabetes Maternal Grandfather   . Hypertension Maternal Uncle     ROS: Review of Systems  Constitutional: Positive for weight loss.  Gastrointestinal: Negative for nausea and vomiting.  Musculoskeletal:       Negative for muscle weakness    PHYSICAL EXAM: Blood pressure 101/67, pulse 78, temperature 98.1 F (36.7 C), temperature source Oral, height 5\' 6"  (1.676 m), weight  196 lb (88.9 kg), SpO2 99 %. Body mass index is 31.64 kg/m. Physical Exam Vitals signs reviewed.  Constitutional:      Appearance: Normal appearance. She is well-developed. She is obese.  Cardiovascular:     Rate and Rhythm: Normal rate.  Pulmonary:     Effort: Pulmonary effort is normal.  Musculoskeletal: Normal range of motion.  Skin:    General: Skin is warm and dry.  Neurological:     Mental Status: She is alert and oriented to person, place, and time.  Psychiatric:        Mood and Affect: Mood normal.        Behavior: Behavior normal.     RECENT LABS AND TESTS: BMET    Component Value Date/Time   NA 140 10/16/2016 1853   NA 136 08/11/2016 1404   K 3.9 10/16/2016 1853   K 4.2 08/11/2016 1404  CL 107 10/16/2016 1853   CL 103 08/11/2016 1404   CO2 25 10/16/2016 1853   CO2 26 08/11/2016 1404   GLUCOSE 103 (H) 10/16/2016 1853   BUN 15 10/16/2016 1853   BUN 13 08/11/2016 1404   CREATININE 0.85 10/16/2016 1853   CREATININE 0.87 08/11/2016 1404   CALCIUM 9.1 10/16/2016 1853   CALCIUM 9.5 08/11/2016 1404   GFRNONAA >60 10/16/2016 1853   GFRAA >60 10/16/2016 1853   Lab Results  Component Value Date   HGBA1C 5.0 05/27/2018   HGBA1C 5.1 05/22/2017   HGBA1C 5.1 05/16/2016   HGBA1C 5.0 04/07/2015   Lab Results  Component Value Date   INSULIN 8.9 06/13/2018   CBC    Component Value Date/Time   WBC 9.3 05/03/2018 1408   WBC 8.2 10/16/2016 1853   RBC 4.85 05/03/2018 1408   HGB 14.3 05/03/2018 1408   HGB 14.2 04/20/2017 1524   HCT 45.4 05/03/2018 1408   HCT 43.2 04/20/2017 1524   PLT 195 05/03/2018 1408   PLT 199 04/20/2017 1524   MCV 93.6 05/03/2018 1408   MCV 90 04/20/2017 1524   MCH 29.5 05/03/2018 1408   MCHC 31.5 05/03/2018 1408   RDW 12.9 05/03/2018 1408   RDW 13.5 04/20/2017 1524   LYMPHSABS 1.8 05/03/2018 1408   LYMPHSABS 1.6 04/20/2017 1524   MONOABS 0.7 05/03/2018 1408   EOSABS 0.5 05/03/2018 1408   EOSABS 0.4 04/20/2017 1524   BASOSABS 0.0  05/03/2018 1408   BASOSABS 0.1 04/20/2017 1524   Iron/TIBC/Ferritin/ %Sat    Component Value Date/Time   IRON 91 05/03/2018 1408   IRON 103 04/20/2017 1524   TIBC 383 05/03/2018 1408   TIBC 450 (H) 04/20/2017 1524   FERRITIN 60 05/03/2018 1408   FERRITIN 23 04/20/2017 1524   IRONPCTSAT 24 05/03/2018 1408   IRONPCTSAT 23 04/20/2017 1524   IRONPCTSAT 18 (L) 10/30/2014 1213   Lipid Panel     Component Value Date/Time   CHOL 119 05/27/2018 0848   TRIG 52.0 05/27/2018 0848   HDL 50.60 05/27/2018 0848   CHOLHDL 2 05/27/2018 0848   VLDL 10.4 05/27/2018 0848   LDLCALC 58 05/27/2018 0848   Hepatic Function Panel     Component Value Date/Time   PROT 7.3 08/11/2016 1404   ALBUMIN 4.2 08/11/2016 1404   AST 9 08/11/2016 1404   ALT 8 08/11/2016 1404   ALKPHOS 58 08/11/2016 1404   BILITOT 0.5 08/11/2016 1404   BILIDIR 0.0 06/24/2012 1055      Component Value Date/Time   TSH 2.250 06/13/2018 1123   TSH 1.28 01/26/2016 1358   TSH 2.46 04/28/2014 0957     Ref. Range 06/13/2018 11:23  Vitamin D, 25-Hydroxy Latest Ref Range: 30.0 - 100.0 ng/mL 40.4     OBESITY BEHAVIORAL INTERVENTION VISIT  Today's visit was # 5   Starting weight: 205 lbs Starting date: 06/13/2018 Today's weight : 196 lbs Today's date: 10/09/2018 Total lbs lost to date: 9    10/09/2018  Height 5\' 6"  (1.676 m)  Weight 196 lb (88.9 kg)  BMI (Calculated) 31.65  BLOOD PRESSURE - SYSTOLIC 353  BLOOD PRESSURE - DIASTOLIC 67   Body Fat % 61.4 %  Total Body Water (lbs) 81 lbs    ASK: We discussed the diagnosis of obesity with Mayo Ao today and Tamara Ball agreed to give [name] permission to discuss obesity behavioral modification therapy today.  ASSESS: Aldine has the diagnosis of obesity and her BMI today is 31.65 [place]  is in the action stage of change   ADVISE: Shelaine was educated on the multiple health risks of obesity as well as the benefit of weight loss to improve her health. She was advised of the need  for long term treatment and the importance of lifestyle modifications to improve her current health and to decrease her risk of future health problems.  AGREE: Multiple dietary modification options and treatment options were discussed and  Dorethea agreed to follow the recommendations documented in the above note.  ARRANGE: Myiesha was educated on the importance of frequent visits to treat obesity as outlined per CMS and USPSTF guidelines and agreed to schedule her next follow up appointment today.  Corey Skains, am acting as Location manager for [employer]. Owens Shark, DO   I have reviewed the above documentation for accuracy and completeness, and I agree with the above. -Jearld Lesch, DO

## 2018-10-12 ENCOUNTER — Encounter (INDEPENDENT_AMBULATORY_CARE_PROVIDER_SITE_OTHER): Payer: Self-pay | Admitting: Bariatrics

## 2018-10-14 ENCOUNTER — Encounter (INDEPENDENT_AMBULATORY_CARE_PROVIDER_SITE_OTHER): Payer: Self-pay | Admitting: Bariatrics

## 2018-10-14 MED ORDER — ONDANSETRON HCL 4 MG PO TABS: 4.0000 mg | ORAL_TABLET | Freq: Three times a day (TID) | ORAL | 0 refills | Status: DC | PRN

## 2018-10-14 NOTE — Telephone Encounter (Signed)
Please review

## 2018-11-11 ENCOUNTER — Telehealth: Payer: Self-pay | Admitting: Family

## 2018-11-11 NOTE — Telephone Encounter (Signed)
Called and spoke with patient regarding appointments added per 7/6 staff message

## 2018-11-14 ENCOUNTER — Other Ambulatory Visit (INDEPENDENT_AMBULATORY_CARE_PROVIDER_SITE_OTHER): Payer: Self-pay | Admitting: Bariatrics

## 2018-11-14 DIAGNOSIS — E669 Obesity, unspecified: Secondary | ICD-10-CM

## 2018-11-14 DIAGNOSIS — Z6831 Body mass index (BMI) 31.0-31.9, adult: Secondary | ICD-10-CM

## 2018-11-16 ENCOUNTER — Other Ambulatory Visit (INDEPENDENT_AMBULATORY_CARE_PROVIDER_SITE_OTHER): Payer: Self-pay | Admitting: Bariatrics

## 2018-11-16 DIAGNOSIS — E669 Obesity, unspecified: Secondary | ICD-10-CM

## 2018-11-16 DIAGNOSIS — Z6831 Body mass index (BMI) 31.0-31.9, adult: Secondary | ICD-10-CM

## 2018-11-18 ENCOUNTER — Encounter (INDEPENDENT_AMBULATORY_CARE_PROVIDER_SITE_OTHER): Payer: Self-pay | Admitting: Bariatrics

## 2018-11-18 ENCOUNTER — Other Ambulatory Visit: Payer: Self-pay

## 2018-11-18 ENCOUNTER — Ambulatory Visit (INDEPENDENT_AMBULATORY_CARE_PROVIDER_SITE_OTHER): Payer: BC Managed Care – PPO | Admitting: Bariatrics

## 2018-11-18 VITALS — BP 116/77 | HR 70 | Temp 99.3°F | Ht 66.0 in | Wt 186.0 lb

## 2018-11-18 DIAGNOSIS — Z6830 Body mass index (BMI) 30.0-30.9, adult: Secondary | ICD-10-CM

## 2018-11-18 DIAGNOSIS — E669 Obesity, unspecified: Secondary | ICD-10-CM

## 2018-11-18 DIAGNOSIS — D508 Other iron deficiency anemias: Secondary | ICD-10-CM | POA: Diagnosis not present

## 2018-11-18 DIAGNOSIS — E559 Vitamin D deficiency, unspecified: Secondary | ICD-10-CM | POA: Diagnosis not present

## 2018-11-18 DIAGNOSIS — Z9189 Other specified personal risk factors, not elsewhere classified: Secondary | ICD-10-CM

## 2018-11-18 DIAGNOSIS — K5909 Other constipation: Secondary | ICD-10-CM | POA: Diagnosis not present

## 2018-11-18 MED ORDER — SAXENDA 18 MG/3ML ~~LOC~~ SOPN: 3.0000 mg | PEN_INJECTOR | Freq: Every day | SUBCUTANEOUS | 0 refills | Status: DC

## 2018-11-18 MED ORDER — BD PEN NEEDLE NANO 2ND GEN 32G X 4 MM MISC: 1.0000 | Freq: Two times a day (BID) | 0 refills | Status: DC

## 2018-11-19 NOTE — Progress Notes (Signed)
Office: [phone]  /  Fax: [phone]   HPI:   Chief Complaint: OBESITY Tamara Ball is here to discuss her progress with her obesity treatment plan. She is on the Category 4 plan and is following her eating plan approximately 90% of the time. She states she is exercising 0 minutes 0 times per week. Tamara Ball is doing well since the last visit and overall. She is taking [name] and initially had nausea. She thinks that the medication is helping.  Her weight is 186 lb (84.4 kg) today and has had a weight loss of 10 pounds over a period of 6 weeks since her last visit. She has lost 19 lbs since starting treatment with [name].  Vitamin D deficiency Tamara Ball has a diagnosis of Vitamin D deficiency. Her last Vitamin D level was reported to be 40.4 on 06/13/2018. She is currently taking OTC Vit D and denies nausea, vomiting or muscle weakness.  At risk for osteopenia and osteoporosis Tamara Ball is at higher risk of osteopenia and osteoporosis due to vitamin D deficiency.   Iron Deficiency Anemia Tamara Ball is on no medication. She has had iron infusions and increased fatigue.  Constipation Tamara Ball notes constipation and currently takes a stool softener.  ASSESSMENT AND PLAN:  Vitamin D deficiency  Other iron deficiency anemia  Other constipation  At risk for osteoporosis  Class 1 obesity with serious comorbidity and body mass index (BMI) of 30.0 to 30.9 in adult, unspecified obesity type - Plan: Liraglutide -Weight Management (SAXENDA) 18 MG/3ML SOPN, Insulin Pen Needle (BD PEN NEEDLE NANO 2ND GEN) 32G X 4 MM MISC  PLAN:  Vitamin D Deficiency Tamara Ball was informed that low Vitamin D levels contributes to fatigue and are associated with obesity, breast, and colon cancer. She agrees to continue taking OTC Vit D and will follow-up for routine testing of Vitamin D, at least 2-3 times per year. She was informed of the risk of over-replacement of Vitamin D and agrees to not increase her dose unless she  discusses this with [name] first. Tamara Ball agrees to follow-up with our clinic in 2 weeks.  At risk for osteopenia and osteoporosis Tamara Ball was given extended  (15 minutes) osteoporosis prevention counseling today. Tamara Ball is at risk for osteopenia and osteoporsis due to her Vitamin D deficiency. She was encouraged to take her Vitamin D and follow her higher calcium diet and increase strengthening exercise to help strengthen her bones and decrease her risk of osteopenia and osteoporosis.  Iron Deficiency Anemia Tamara Ball was instructed to see her hematologist next week.  Constipation Tamara Ball will take MiraLax OTC. She was instructed to increase fruits and vegetables and ground flaxseed in her yogurt.  Obesity Tamara Ball is currently in the action stage of change. As such, her goal is to continue with weight loss efforts. She has agreed to follow the Category 4 plan.  Tamara Ball will work on meal planning and intentional eating. She was given a one month supply of Saxenda 3 mg a day #5 pens with 0 refills and insulin needles 6 x 4 mm 1 month supply with 0 refills. Tamara Ball has been instructed to work up to a goal of 150 minutes of combined cardio and strengthening exercise per week for weight loss and overall health benefits. We discussed the following Behavioral Modification Strategies today: increasing lean protein intake, decreasing simple carbohydrates, increasing vegetables, increase H20 intake, decrease eating out, no skipping meals, work on meal planning and easy cooking plans, and keeping healthy foods in the home.  Tamara Ball has agreed  to follow-up with our clinic in 2 weeks. She was informed of the importance of frequent follow-up visits to maximize her success with intensive lifestyle modifications for her multiple health conditions.  ALLERGIES: No Known Allergies  MEDICATIONS: Current Outpatient Medications on File Prior to Visit  Medication Sig Dispense Refill  . AVIANE 0.1-20 MG-MCG tablet     . b  complex vitamins tablet Take 1 tablet by mouth daily.    . Biotin 10000 MCG TABS Take 1 tablet by mouth daily.    Marland Kitchen DYMISTA 137-50 MCG/ACT SUSP     . levocetirizine (XYZAL) 5 MG tablet Take 1 tablet (5 mg total) by mouth every evening. 30 tablet 3  . montelukast (SINGULAIR) 10 MG tablet Take 1 tablet (10 mg total) by mouth at bedtime. 30 tablet 3  . Multiple Vitamin (MULTIVITAMIN WITH MINERALS) TABS tablet Take 1 tablet by mouth daily.    . Omega-3 Fatty Acids (FISH OIL) 1000 MG CAPS Take 1 capsule by mouth daily.    . ondansetron (ZOFRAN) 4 MG tablet Take 1 tablet (4 mg total) by mouth every 8 (eight) hours as needed for nausea or vomiting. 20 tablet 0   No current facility-administered medications on file prior to visit.     PAST MEDICAL HISTORY: Past Medical History:  Diagnosis Date  . Anemia   . Anxiety   . Chicken pox   . Chronic headaches   . Constipation   . Fibroids   . Iron deficiency anemia due to chronic blood loss    from heavy periods  . Menorrhagia   . Palpitations     PAST SURGICAL HISTORY: History reviewed. No pertinent surgical history.  SOCIAL HISTORY: Social History   Tobacco Use  . Smoking status: Never Smoker  . Smokeless tobacco: Never Used  Substance Use Topics  . Alcohol use: Yes    Alcohol/week: 0.0 standard drinks    Comment: social  . Drug use: No    FAMILY HISTORY: Family History  Problem Relation Age of Onset  . Hypertension Mother   . Cancer Mother   . Diabetes Maternal Grandfather   . Hypertension Maternal Uncle    ROS: Review of Systems  Gastrointestinal: Positive for constipation. Negative for nausea and vomiting.  Musculoskeletal:       Negative for muscle weakness.  Endo/Heme/Allergies:       Positive for iron deficiency anemia.   PHYSICAL EXAM: Blood pressure 116/77, pulse 70, temperature 99.3 F (37.4 C), temperature source Oral, height 5\' 6"  (1.676 m), weight 186 lb (84.4 kg), SpO2 100 %. Body mass index is 30.02  kg/m. Physical Exam Vitals signs reviewed.  Constitutional:      Appearance: Normal appearance. She is obese.  Cardiovascular:     Rate and Rhythm: Normal rate.     Pulses: Normal pulses.  Pulmonary:     Effort: Pulmonary effort is normal.     Breath sounds: Normal breath sounds.  Musculoskeletal: Normal range of motion.  Skin:    General: Skin is warm and dry.  Neurological:     Mental Status: She is alert and oriented to person, place, and time.  Psychiatric:        Behavior: Behavior normal.   RECENT LABS AND TESTS: BMET    Component Value Date/Time   NA 140 10/16/2016 1853   NA 136 08/11/2016 1404   K 3.9 10/16/2016 1853   K 4.2 08/11/2016 1404   CL 107 10/16/2016 1853   CL 103 08/11/2016 1404  CO2 25 10/16/2016 1853   CO2 26 08/11/2016 1404   GLUCOSE 103 (H) 10/16/2016 1853   BUN 15 10/16/2016 1853   BUN 13 08/11/2016 1404   CREATININE 0.85 10/16/2016 1853   CREATININE 0.87 08/11/2016 1404   CALCIUM 9.1 10/16/2016 1853   CALCIUM 9.5 08/11/2016 1404   GFRNONAA >60 10/16/2016 1853   GFRAA >60 10/16/2016 1853   Lab Results  Component Value Date   HGBA1C 5.0 05/27/2018   HGBA1C 5.1 05/22/2017   HGBA1C 5.1 05/16/2016   HGBA1C 5.0 04/07/2015   Lab Results  Component Value Date   INSULIN 8.9 06/13/2018   CBC    Component Value Date/Time   WBC 9.3 05/03/2018 1408   WBC 8.2 10/16/2016 1853   RBC 4.85 05/03/2018 1408   HGB 14.3 05/03/2018 1408   HGB 14.2 04/20/2017 1524   HCT 45.4 05/03/2018 1408   HCT 43.2 04/20/2017 1524   PLT 195 05/03/2018 1408   PLT 199 04/20/2017 1524   MCV 93.6 05/03/2018 1408   MCV 90 04/20/2017 1524   MCH 29.5 05/03/2018 1408   MCHC 31.5 05/03/2018 1408   RDW 12.9 05/03/2018 1408   RDW 13.5 04/20/2017 1524   LYMPHSABS 1.8 05/03/2018 1408   LYMPHSABS 1.6 04/20/2017 1524   MONOABS 0.7 05/03/2018 1408   EOSABS 0.5 05/03/2018 1408   EOSABS 0.4 04/20/2017 1524   BASOSABS 0.0 05/03/2018 1408   BASOSABS 0.1 04/20/2017 1524    Iron/TIBC/Ferritin/ %Sat    Component Value Date/Time   IRON 91 05/03/2018 1408   IRON 103 04/20/2017 1524   TIBC 383 05/03/2018 1408   TIBC 450 (H) 04/20/2017 1524   FERRITIN 60 05/03/2018 1408   FERRITIN 23 04/20/2017 1524   IRONPCTSAT 24 05/03/2018 1408   IRONPCTSAT 23 04/20/2017 1524   IRONPCTSAT 18 (L) 10/30/2014 1213   Lipid Panel     Component Value Date/Time   CHOL 119 05/27/2018 0848   TRIG 52.0 05/27/2018 0848   HDL 50.60 05/27/2018 0848   CHOLHDL 2 05/27/2018 0848   VLDL 10.4 05/27/2018 0848   LDLCALC 58 05/27/2018 0848   Hepatic Function Panel     Component Value Date/Time   PROT 7.3 08/11/2016 1404   ALBUMIN 4.2 08/11/2016 1404   AST 9 08/11/2016 1404   ALT 8 08/11/2016 1404   ALKPHOS 58 08/11/2016 1404   BILITOT 0.5 08/11/2016 1404   BILIDIR 0.0 06/24/2012 1055      Component Value Date/Time   TSH 2.250 06/13/2018 1123   TSH 1.28 01/26/2016 1358   TSH 2.46 04/28/2014 0957   Results for GRACEANNE, GUIN (MRN [id number]) as of 11/19/2018 08:09  Ref. Range 06/13/2018 11:23  Vitamin D, 25-Hydroxy Latest Ref Range: 30.0 - 100.0 ng/mL 40.4   OBESITY BEHAVIORAL INTERVENTION VISIT  Today's visit was #6  Starting weight: 205 lbs Starting date: 06/13/2018 Today's weight: 186 lbs  Today's date: 11/18/2018 Total lbs lost to date: 19   11/18/2018  Height 5\' 6"  (1.676 m)  Weight 186 lb (84.4 kg)  BMI (Calculated) 30.04  BLOOD PRESSURE - SYSTOLIC 810  BLOOD PRESSURE - DIASTOLIC 77   Body Fat % 17.5 %  Total Body Water (lbs) 76.8 lbs   ASK: We discussed the diagnosis of obesity with Tamara Ball today and Tamara Ball agreed to give [name] permission to discuss obesity behavioral modification therapy today.  ASSESS: Tamara Ball has the diagnosis of obesity and her BMI today is 30.1. Tamara Ball is in the action stage of change.  ADVISE: Tamara Ball was educated on the multiple health risks of obesity as well as the benefit of weight loss to improve her health. She was advised  of the need for long term treatment and the importance of lifestyle modifications to improve her current health and to decrease her risk of future health problems.  AGREE: Multiple dietary modification options and treatment options were discussed and  Tamara Ball agreed to follow the recommendations documented in the above note.  ARRANGE: Tamara Ball was educated on the importance of frequent visits to treat obesity as outlined per CMS and USPSTF guidelines and agreed to schedule her next follow up appointment today.  Migdalia Dk, am acting as Location manager for [employer], DO  I have reviewed the above documentation for accuracy and completeness, and I agree with the above. -Jearld Lesch, DO

## 2018-11-27 ENCOUNTER — Encounter: Payer: Self-pay | Admitting: Family

## 2018-11-27 ENCOUNTER — Other Ambulatory Visit: Payer: Self-pay

## 2018-11-27 ENCOUNTER — Inpatient Hospital Stay: Payer: BC Managed Care – PPO | Attending: Hematology & Oncology

## 2018-11-27 ENCOUNTER — Inpatient Hospital Stay (HOSPITAL_BASED_OUTPATIENT_CLINIC_OR_DEPARTMENT_OTHER): Payer: BC Managed Care – PPO | Admitting: Family

## 2018-11-27 VITALS — BP 117/68 | HR 72 | Temp 98.4°F | Resp 17 | Ht 66.0 in | Wt 188.0 lb

## 2018-11-27 DIAGNOSIS — D500 Iron deficiency anemia secondary to blood loss (chronic): Secondary | ICD-10-CM | POA: Insufficient documentation

## 2018-11-27 DIAGNOSIS — N920 Excessive and frequent menstruation with regular cycle: Secondary | ICD-10-CM | POA: Insufficient documentation

## 2018-11-27 DIAGNOSIS — K5909 Other constipation: Secondary | ICD-10-CM | POA: Diagnosis not present

## 2018-11-27 LAB — CBC WITH DIFFERENTIAL (CANCER CENTER ONLY)
Abs Immature Granulocytes: 0.02 10*3/uL (ref 0.00–0.07)
Basophils Absolute: 0 10*3/uL (ref 0.0–0.1)
Basophils Relative: 1 %
Eosinophils Absolute: 0.4 10*3/uL (ref 0.0–0.5)
Eosinophils Relative: 5 %
HCT: 46 % (ref 36.0–46.0)
Hemoglobin: 14.8 g/dL (ref 12.0–15.0)
Immature Granulocytes: 0 %
Lymphocytes Relative: 18 %
Lymphs Abs: 1.5 10*3/uL (ref 0.7–4.0)
MCH: 29.4 pg (ref 26.0–34.0)
MCHC: 32.2 g/dL (ref 30.0–36.0)
MCV: 91.3 fL (ref 80.0–100.0)
Monocytes Absolute: 0.4 10*3/uL (ref 0.1–1.0)
Monocytes Relative: 5 %
Neutro Abs: 5.8 10*3/uL (ref 1.7–7.7)
Neutrophils Relative %: 71 %
Platelet Count: 211 10*3/uL (ref 150–400)
RBC: 5.04 MIL/uL (ref 3.87–5.11)
RDW: 12.8 % (ref 11.5–15.5)
WBC Count: 8.1 10*3/uL (ref 4.0–10.5)
nRBC: 0 % (ref 0.0–0.2)

## 2018-11-27 NOTE — Progress Notes (Signed)
Hematology and Oncology Follow Up Visit  Tamara Ball [id number] Sep 01, 1980 38 y.o. 11/27/2018   Principle Diagnosis:  Iron deficiency anemia secondary to menorrhagia  Current Therapy:   IV iron as needed    Interim History:  Tamara Ball is here today for follow-up. She is symptomatic with fatigue, lightheadedness and an increase in episodes of migraines which she states happens when her iron is low.  She is still on an oral contraceptive and has heavy, regular cycles.  She has chronic constipation and takes a stool softener and mirilax when needed. She states that she will occasionally have a little blood on her toilet tissue due to straining and hemorrhoids.  No other blood loss noted by patient. No petechiae. She does bruise easily but not in excess.  No fever, chills, n/v, cough, rash, SOB, chest pain, palpitations, abdominal pain or changes in bowel or bladder habits.  No swelling or tenderness in her extremities. She states that she has restless legs and has to stretch at times for some relief.  She has maintained a good appetite and is staying well hydrated. Her weight is stable.  She is walking 3 1/2 miles a day for exercise.  She is staying busy with work as well.   ECOG Performance Status: 1 - Symptomatic but completely ambulatory  Medications:  Allergies as of 11/27/2018   No Known Allergies     Medication List       Accurate as of November 27, 2018 11:09 AM. If you have any questions, ask your nurse or doctor.        Aviane 0.1-20 MG-MCG tablet Generic drug: levonorgestrel-ethinyl estradiol   b complex vitamins tablet Take 1 tablet by mouth daily.   BD Pen Needle Nano 2nd Gen 32G X 4 MM Misc Generic drug: Insulin Pen Needle 1 Package by Does not apply route 2 (two) times daily.   Biotin 10000 MCG Tabs Take 1 tablet by mouth daily.   Dymista 137-50 MCG/ACT Susp Generic drug: Azelastine-Fluticasone   Fish Oil 1000 MG Caps Take 1 capsule by mouth daily.    levocetirizine 5 MG tablet Commonly known as: XYZAL Take 1 tablet (5 mg total) by mouth every evening.   montelukast 10 MG tablet Commonly known as: SINGULAIR Take 1 tablet (10 mg total) by mouth at bedtime.   multivitamin with minerals Tabs tablet Take 1 tablet by mouth daily.   ondansetron 4 MG tablet Commonly known as: Zofran Take 1 tablet (4 mg total) by mouth every 8 (eight) hours as needed for nausea or vomiting.   Saxenda 18 MG/3ML Sopn Generic drug: Liraglutide -Weight Management Inject 3 mg into the skin daily.       Allergies: No Known Allergies  Past Medical History, Surgical history, Social history, and Family History were reviewed and updated.  Review of Systems: All other 10 point review of systems is negative.   Physical Exam:  vitals were not taken for this visit.   Wt Readings from Last 3 Encounters:  11/18/18 186 lb (84.4 kg)  10/09/18 196 lb (88.9 kg)  07/16/18 199 lb (90.3 kg)    Ocular: Sclerae unicteric, pupils equal, round and reactive to light Ear-nose-throat: Oropharynx clear, dentition fair Lymphatic: No cervical or supraclavicular adenopathy Lungs no rales or rhonchi, good excursion bilaterally Heart regular rate and rhythm, no murmur appreciated Abd soft, nontender, positive bowel sounds, no liver or spleen tip palpated on exam, no fluid wave  MSK no focal spinal tenderness, no joint edema Neuro:  non-focal, well-oriented, appropriate affect Breasts: Deferred   Lab Results  Component Value Date   WBC 8.1 11/27/2018   HGB 14.8 11/27/2018   HCT 46.0 11/27/2018   MCV 91.3 11/27/2018   PLT 211 11/27/2018   Lab Results  Component Value Date   FERRITIN 60 05/03/2018   IRON 91 05/03/2018   TIBC 383 05/03/2018   UIBC 292 05/03/2018   IRONPCTSAT 24 05/03/2018   Lab Results  Component Value Date   RETICCTPCT 1.4 05/16/2016   RBC 5.04 11/27/2018   RETICCTABS 64,400 05/16/2016   No results found for: KPAFRELGTCHN, LAMBDASER,  KAPLAMBRATIO No results found for: IGGSERUM, IGA, IGMSERUM No results found for: Odetta Pink, SPEI   Chemistry      Component Value Date/Time   NA 140 10/16/2016 1853   NA 136 08/11/2016 1404   K 3.9 10/16/2016 1853   K 4.2 08/11/2016 1404   CL 107 10/16/2016 1853   CL 103 08/11/2016 1404   CO2 25 10/16/2016 1853   CO2 26 08/11/2016 1404   BUN 15 10/16/2016 1853   BUN 13 08/11/2016 1404   CREATININE 0.85 10/16/2016 1853   CREATININE 0.87 08/11/2016 1404      Component Value Date/Time   CALCIUM 9.1 10/16/2016 1853   CALCIUM 9.5 08/11/2016 1404   ALKPHOS 58 08/11/2016 1404   AST 9 08/11/2016 1404   ALT 8 08/11/2016 1404   BILITOT 0.5 08/11/2016 1404       Impression and Plan: Tamara Ball is a very pleasant 38 yo African American female with iron deficiency secondary to heavy cycles.  She is symptomatic as mentioned above.  We will see what her iron studies show and bring her back in for infusion if needed.  We will go ahead and plan to see her back in another 3 months.  She will contact our office with any questions or concerns. We can certainly see her sooner if needed.   [name], NP 7/22/202011:09 AM

## 2018-11-28 ENCOUNTER — Telehealth: Payer: Self-pay | Admitting: Family

## 2018-11-28 LAB — IRON AND TIBC
Iron: 52 ug/dL (ref 41–142)
Saturation Ratios: 12 % — ABNORMAL LOW (ref 21–57)
TIBC: 448 ug/dL — ABNORMAL HIGH (ref 236–444)
UIBC: 396 ug/dL — ABNORMAL HIGH (ref 120–384)

## 2018-11-28 LAB — FERRITIN: Ferritin: 43 ng/mL (ref 11–307)

## 2018-11-28 NOTE — Telephone Encounter (Signed)
No los 7/22 los 

## 2018-12-06 ENCOUNTER — Other Ambulatory Visit: Payer: Self-pay

## 2018-12-06 ENCOUNTER — Inpatient Hospital Stay: Payer: BC Managed Care – PPO

## 2018-12-06 VITALS — BP 106/67 | HR 84 | Temp 97.0°F | Resp 17

## 2018-12-06 DIAGNOSIS — D500 Iron deficiency anemia secondary to blood loss (chronic): Secondary | ICD-10-CM

## 2018-12-06 DIAGNOSIS — N920 Excessive and frequent menstruation with regular cycle: Secondary | ICD-10-CM

## 2018-12-06 MED ORDER — SODIUM CHLORIDE 0.9 % IV SOLN
Freq: Once | INTRAVENOUS | Status: AC
  Administered 2018-12-06: 13:00:00 via INTRAVENOUS
  Filled 2018-12-06: qty 250

## 2018-12-06 MED ORDER — SODIUM CHLORIDE 0.9 % IV SOLN
510.0000 mg | Freq: Once | INTRAVENOUS | Status: AC
  Administered 2018-12-06: 510 mg via INTRAVENOUS
  Filled 2018-12-06: qty 510

## 2018-12-06 NOTE — Patient Instructions (Signed)

## 2018-12-09 ENCOUNTER — Encounter (INDEPENDENT_AMBULATORY_CARE_PROVIDER_SITE_OTHER): Payer: Self-pay | Admitting: Bariatrics

## 2018-12-09 ENCOUNTER — Ambulatory Visit (INDEPENDENT_AMBULATORY_CARE_PROVIDER_SITE_OTHER): Payer: BC Managed Care – PPO | Admitting: Bariatrics

## 2018-12-09 ENCOUNTER — Other Ambulatory Visit: Payer: Self-pay

## 2018-12-09 VITALS — BP 94/58 | HR 63 | Temp 97.9°F | Ht 66.0 in | Wt 182.0 lb

## 2018-12-09 DIAGNOSIS — E559 Vitamin D deficiency, unspecified: Secondary | ICD-10-CM | POA: Diagnosis not present

## 2018-12-09 DIAGNOSIS — E669 Obesity, unspecified: Secondary | ICD-10-CM

## 2018-12-09 DIAGNOSIS — Z9189 Other specified personal risk factors, not elsewhere classified: Secondary | ICD-10-CM | POA: Diagnosis not present

## 2018-12-09 DIAGNOSIS — K5909 Other constipation: Secondary | ICD-10-CM | POA: Diagnosis not present

## 2018-12-09 DIAGNOSIS — Z6830 Body mass index (BMI) 30.0-30.9, adult: Secondary | ICD-10-CM

## 2018-12-09 NOTE — Progress Notes (Signed)
Office: [phone]  /  Fax: [phone]   HPI:   Chief Complaint: OBESITY Tamara Ball is here to discuss her progress with her obesity treatment plan. She is on the Category 3 plan and is following her eating plan approximately 80 % of the time. She states she is walking 4 miles 3 times per week. Tamara Ball is down 4 pounds and she is doing well overall. She is taking [name]. Trannie is not getting in adequate water. Her weight is 182 lb (82.6 kg) today and has had a weight loss of 4 pounds over a period of 3 weeks since her last visit. She has lost 23 lbs since starting treatment with [name].  Vitamin D deficiency Tamara Ball has a diagnosis of vitamin D deficiency. She is currently taking vit D and denies nausea, vomiting or muscle weakness.  At risk for osteopenia and osteoporosis Tamara Ball is at higher risk of osteopenia and osteoporosis due to vitamin D deficiency.   Constipation Tamara Ball notes constipation has improved. She denies retained stool. She is not getting in adequate water.  ASSESSMENT AND PLAN:  Vitamin D deficiency  Other constipation  At risk for osteoporosis  Class 1 obesity with serious comorbidity and body mass index (BMI) of 30.0 to 30.9 in adult, unspecified obesity type - BMI greater than 30 at start of program  - Plan: Liraglutide -Weight Management (SAXENDA) 18 MG/3ML SOPN, Insulin Pen Needle (BD PEN NEEDLE NANO 2ND GEN) 32G X 4 MM MISC  Class 1 obesity with serious comorbidity and body mass index (BMI) of 30.0 to 30.9 in adult, unspecified obesity type - Plan: Liraglutide -Weight Management (SAXENDA) 18 MG/3ML SOPN, Insulin Pen Needle (BD PEN NEEDLE NANO 2ND GEN) 32G X 4 MM MISC  PLAN:  Vitamin D Deficiency Tamara Ball was informed that low vitamin D levels contributes to fatigue and are associated with obesity, breast, and colon cancer. Tamara Ball will continue taking OTC vitamin D and she will follow up for routine testing of vitamin D, at least 2-3 times per year. She was  informed of the risk of over-replacement of vitamin D and agrees to not increase her dose unless she discusses this with [name] first.  At risk for osteopenia and osteoporosis Tamara Ball was given extended  (15 minutes) osteoporosis prevention counseling today. Tamara Ball is at risk for osteopenia and osteoporosis due to her vitamin D deficiency. She was encouraged to take her vitamin D and follow her higher calcium diet and increase strengthening exercise to help strengthen her bones and decrease her risk    of osteopenia and osteoporosis.  Constipation Tamara Ball was informed decrease bowel movement frequency is normal while losing weight, but stools should not be hard or painful. She was advised to increase her H20 intake and increase raw vegetables and fruit. Tamara Ball will continue taking Fibercon and stool softeners. She agrees to follow up with our clinic in 2 weeks.  Obesity Tamara Ball is currently in the action stage of change. As such, her goal is to continue with weight loss efforts She has agreed to follow the Category 4 plan Tamara Ball will continue walking 3 times a week and add weights and resistance gradually for weight loss and overall health benefits. We discussed the following Behavioral Modification Strategies today: increase H2O intake, no skipping meals, keeping healthy foods in the home, increasing lean protein intake, decreasing simple carbohydrates , increasing vegetables, decrease eating out and work on meal planning and easy cooking plans  Tamara Ball will continue Saxenda 3 mg subQ #5 pens with no refills,  insulin needles 32 gauge x 4 mm #100 with no refills.  Tamara Ball has agreed to follow up with our clinic in 2 weeks. She w/as informed of the importance of frequent follow up visits to maximize her success with intensive lifestyle modifications for her multiple health conditions.  ALLERGIES: No Known Allergies  MEDICATIONS: Current Outpatient Medications on File Prior to Visit  Medication Sig  Dispense Refill  . AVIANE 0.1-20 MG-MCG tablet     . b complex vitamins tablet Take 1 tablet by mouth daily.    . Biotin 10000 MCG TABS Take 1 tablet by mouth daily.    Marland Kitchen DYMISTA 137-50 MCG/ACT SUSP     . Insulin Pen Needle (BD PEN NEEDLE NANO 2ND GEN) 32G X 4 MM MISC 1 Package by Does not apply route 2 (two) times daily. 100 each 0  . levocetirizine (XYZAL) 5 MG tablet Take 1 tablet (5 mg total) by mouth every evening. 30 tablet 3  . Liraglutide -Weight Management (SAXENDA) 18 MG/3ML SOPN Inject 3 mg into the skin daily. 5 pen 0  . montelukast (SINGULAIR) 10 MG tablet Take 1 tablet (10 mg total) by mouth at bedtime. 30 tablet 3  . Multiple Vitamin (MULTIVITAMIN WITH MINERALS) TABS tablet Take 1 tablet by mouth daily.    . Omega-3 Fatty Acids (FISH OIL) 1000 MG CAPS Take 1 capsule by mouth daily.    . ondansetron (ZOFRAN) 4 MG tablet Take 1 tablet (4 mg total) by mouth every 8 (eight) hours as needed for nausea or vomiting. 20 tablet 0   No current facility-administered medications on file prior to visit.     PAST MEDICAL HISTORY: Past Medical History:  Diagnosis Date  . Anemia   . Anxiety   . Chicken pox   . Chronic headaches   . Constipation   . Fibroids   . Iron deficiency anemia due to chronic blood loss    from heavy periods  . Menorrhagia   . Palpitations     PAST SURGICAL HISTORY: History reviewed. No pertinent surgical history.  SOCIAL HISTORY: Social History   Tobacco Use  . Smoking status: Never Smoker  . Smokeless tobacco: Never Used  Substance Use Topics  . Alcohol use: Yes    Alcohol/week: 0.0 standard drinks    Comment: social  . Drug use: No    FAMILY HISTORY: Family History  Problem Relation Age of Onset  . Hypertension Mother   . Cancer Mother   . Diabetes Maternal Grandfather   . Hypertension Maternal Uncle     ROS: Review of Systems  Constitutional: Positive for weight loss.  Gastrointestinal: Positive for constipation. Negative for nausea  and vomiting.  Musculoskeletal:       Negative for muscle weakness    PHYSICAL EXAM: Blood pressure (!) 94/58, pulse 63, temperature 97.9 F (36.6 C), temperature source Oral, height 5\' 6"  (1.676 m), weight 182 lb (82.6 kg), SpO2 100 %. Body mass index is 29.38 kg/m. Physical Exam Vitals signs reviewed.  Constitutional:      Appearance: Normal appearance. She is well-developed. She is obese.  Cardiovascular:     Rate and Rhythm: Normal rate.  Pulmonary:     Effort: Pulmonary effort is normal.  Musculoskeletal: Normal range of motion.  Skin:    General: Skin is warm and dry.  Neurological:     Mental Status: She is alert and oriented to person, place, and time.  Psychiatric:        Mood and Affect: Mood  normal.        Behavior: Behavior normal.     RECENT LABS AND TESTS: BMET    Component Value Date/Time   NA 140 10/16/2016 1853   NA 136 08/11/2016 1404   K 3.9 10/16/2016 1853   K 4.2 08/11/2016 1404   CL 107 10/16/2016 1853   CL 103 08/11/2016 1404   CO2 25 10/16/2016 1853   CO2 26 08/11/2016 1404   GLUCOSE 103 (H) 10/16/2016 1853   BUN 15 10/16/2016 1853   BUN 13 08/11/2016 1404   CREATININE 0.85 10/16/2016 1853   CREATININE 0.87 08/11/2016 1404   CALCIUM 9.1 10/16/2016 1853   CALCIUM 9.5 08/11/2016 1404   GFRNONAA >60 10/16/2016 1853   GFRAA >60 10/16/2016 1853   Lab Results  Component Value Date   HGBA1C 5.0 05/27/2018   HGBA1C 5.1 05/22/2017   HGBA1C 5.1 05/16/2016   HGBA1C 5.0 04/07/2015   Lab Results  Component Value Date   INSULIN 8.9 06/13/2018   CBC    Component Value Date/Time   WBC 8.1 11/27/2018 1044   WBC 8.2 10/16/2016 1853   RBC 5.04 11/27/2018 1044   HGB 14.8 11/27/2018 1044   HGB 14.2 04/20/2017 1524   HCT 46.0 11/27/2018 1044   HCT 43.2 04/20/2017 1524   PLT 211 11/27/2018 1044   PLT 199 04/20/2017 1524   MCV 91.3 11/27/2018 1044   MCV 90 04/20/2017 1524   MCH 29.4 11/27/2018 1044   MCHC 32.2 11/27/2018 1044   RDW 12.8  11/27/2018 1044   RDW 13.5 04/20/2017 1524   LYMPHSABS 1.5 11/27/2018 1044   LYMPHSABS 1.6 04/20/2017 1524   MONOABS 0.4 11/27/2018 1044   EOSABS 0.4 11/27/2018 1044   EOSABS 0.4 04/20/2017 1524   BASOSABS 0.0 11/27/2018 1044   BASOSABS 0.1 04/20/2017 1524   Iron/TIBC/Ferritin/ %Sat    Component Value Date/Time   IRON 52 11/27/2018 1044   IRON 103 04/20/2017 1524   TIBC 448 (H) 11/27/2018 1044   TIBC 450 (H) 04/20/2017 1524   FERRITIN 43 11/27/2018 1044   FERRITIN 23 04/20/2017 1524   IRONPCTSAT 12 (L) 11/27/2018 1044   IRONPCTSAT 23 04/20/2017 1524   IRONPCTSAT 18 (L) 10/30/2014 1213   Lipid Panel     Component Value Date/Time   CHOL 119 05/27/2018 0848   TRIG 52.0 05/27/2018 0848   HDL 50.60 05/27/2018 0848   CHOLHDL 2 05/27/2018 0848   VLDL 10.4 05/27/2018 0848   LDLCALC 58 05/27/2018 0848   Hepatic Function Panel     Component Value Date/Time   PROT 7.3 08/11/2016 1404   ALBUMIN 4.2 08/11/2016 1404   AST 9 08/11/2016 1404   ALT 8 08/11/2016 1404   ALKPHOS 58 08/11/2016 1404   BILITOT 0.5 08/11/2016 1404   BILIDIR 0.0 06/24/2012 1055      Component Value Date/Time   TSH 2.250 06/13/2018 1123   TSH 1.28 01/26/2016 1358   TSH 2.46 04/28/2014 0957      OBESITY BEHAVIORAL INTERVENTION VISIT  Today's visit was # 7   Starting weight: 205 lbs Starting date: 06/13/2018 Today's weight : 182 lbs Today's date: 12/09/2018 Total lbs lost to date: 23    12/09/2018  Height 5\' 6"  (1.676 m)  Weight 182 lb (82.6 kg)  BMI (Calculated) 29.39  BLOOD PRESSURE - SYSTOLIC 94  BLOOD PRESSURE - DIASTOLIC 58   Body Fat % 83.3 %  Total Body Water (lbs) 74.6 lbs    ASK: We discussed the diagnosis of  obesity with Mayo Ao today and [name] agreed to give [name] permission to discuss obesity behavioral modification therapy today.  ASSESS: Zoriana has the diagnosis of obesity and her BMI today is 29.39 Blandina is in the action stage of change   ADVISE: Cathyann was educated  on the multiple health risks of obesity as well as the benefit of weight loss to improve her health. She was advised of the need for long term treatment and the importance of lifestyle modifications to improve her current health and to decrease her risk of future health problems.  AGREE: Multiple dietary modification options and treatment options were discussed and  Denaya agreed to follow the recommendations documented in the above note.  ARRANGE: [name] was educated on the importance of frequent visits to treat obesity as outlined per CMS and USPSTF guidelines and agreed to schedule her next follow up appointment today.  Corey Skains, am acting as Location manager for [employer]. Owens Shark, DO  I have reviewed the above documentation for accuracy and completeness, and I agree with the above. -Jearld Lesch, DO

## 2018-12-10 MED ORDER — SAXENDA 18 MG/3ML ~~LOC~~ SOPN: 3.0000 mg | PEN_INJECTOR | Freq: Every day | SUBCUTANEOUS | 0 refills | Status: DC

## 2018-12-10 MED ORDER — BD PEN NEEDLE NANO 2ND GEN 32G X 4 MM MISC: 1.0000 | Freq: Two times a day (BID) | 0 refills | Status: DC

## 2018-12-16 ENCOUNTER — Telehealth: Payer: Self-pay | Admitting: Family Medicine

## 2018-12-16 NOTE — Telephone Encounter (Signed)
Called patient and scheduled appt for Blood in stool. I know I have hemorrhoids which cause bleeding and it definitely hurts when I use the restybut I want to see about getting a colonoscopy just be sure because this has been a consistent problem

## 2018-12-17 ENCOUNTER — Telehealth (INDEPENDENT_AMBULATORY_CARE_PROVIDER_SITE_OTHER): Payer: BC Managed Care – PPO | Admitting: Family Medicine

## 2018-12-17 ENCOUNTER — Other Ambulatory Visit: Payer: Self-pay

## 2018-12-17 ENCOUNTER — Encounter: Payer: Self-pay | Admitting: Gastroenterology

## 2018-12-17 ENCOUNTER — Encounter: Payer: Self-pay | Admitting: Family Medicine

## 2018-12-17 DIAGNOSIS — K5900 Constipation, unspecified: Secondary | ICD-10-CM

## 2018-12-17 DIAGNOSIS — K649 Unspecified hemorrhoids: Secondary | ICD-10-CM | POA: Diagnosis not present

## 2018-12-17 MED ORDER — HYDROCORTISONE (PERIANAL) 2.5 % EX CREA: 1.0000 "application " | TOPICAL_CREAM | Freq: Two times a day (BID) | CUTANEOUS | 0 refills | Status: DC

## 2018-12-17 NOTE — Patient Instructions (Signed)
-We placed a referral for you as discussed. It usually takes about 1-2 weeks to process and schedule this referral. If you have not heard from [name] regarding this appointment in 2 weeks please contact our office.  -daily fiber such as benafiber, metamucil, etc in full glass of liquid every morning.  -Mirilax 1 capful daily or Magnsium (CALM powder disolved in hot water then in tea or lemonade or Pure encapsulations - 1 tsp daily)  -Sitz baths and Anusol for hemorrhoids  I hope you are feeling better soon! Seek care promptly if your symptoms worsen, new concerns arise or you are not improving with treatment.    Hemorrhoids Hemorrhoids are swollen veins that may develop:  In the butt (rectum). These are called internal hemorrhoids.  Around the opening of the butt (anus). These are called external hemorrhoids. Hemorrhoids can cause pain, itching, or bleeding. Most of the time, they do not cause serious problems. They usually get better with diet changes, lifestyle changes, and other home treatments. What are the causes? This condition may be caused by:  Having trouble pooping (constipation).  Pushing hard (straining) to poop.  Watery poop (diarrhea).  Pregnancy.  Being very overweight (obese).  Sitting for long periods of time.  Heavy lifting or other activity that causes you to strain.  Anal sex.  Riding a bike for a long period of time. What are the signs or symptoms? Symptoms of this condition include:  Pain.  Itching or soreness in the butt.  Bleeding from the butt.  Leaking poop.  Swelling in the area.  One or more lumps around the opening of your butt. How is this diagnosed? A doctor can often diagnose this condition by looking at the affected area. The doctor may also:  Do an exam that involves feeling the area with a gloved hand (digital rectal exam).  Examine the area inside your butt using a small tube (anoscope).  Order blood tests. This may be done  if you have lost a lot of blood.  Have you get a test that involves looking inside the colon using a flexible tube with a camera on the end (sigmoidoscopy or colonoscopy). How is this treated? This condition can usually be treated at home. Your doctor may tell you to change what you eat, make lifestyle changes, or try home treatments. If these do not help, procedures can be done to remove the hemorrhoids or make them smaller. These may involve:  Placing rubber bands at the base of the hemorrhoids to cut off their blood supply.  Injecting medicine into the hemorrhoids to shrink them.  Shining a type of light energy onto the hemorrhoids to cause them to fall off.  Doing surgery to remove the hemorrhoids or cut off their blood supply. Follow these instructions at home: Eating and drinking   Eat foods that have a lot of fiber in them. These include whole grains, beans, nuts, fruits, and vegetables.  Ask your doctor about taking products that have added fiber (fibersupplements).  Reduce the amount of fat in your diet. You can do this by: ? Eating low-fat dairy products. ? Eating less red meat. ? Avoiding processed foods.  Drink enough fluid to keep your pee (urine) pale yellow. Managing pain and swelling   Take a warm-water bath (sitz bath) for 20 minutes to ease pain. Do this 3-4 times a day. You may do this in a bathtub or using a portable sitz bath that fits over the toilet.  If told, put  ice on the painful area. It may be helpful to use ice between your warm baths. ? Put ice in a plastic bag. ? Place a towel between your skin and the bag. ? Leave the ice on for 20 minutes, 2-3 times a day. General instructions  Take over-the-counter and prescription medicines only as told by your doctor. ? Medicated creams and medicines may be used as told.  Exercise often. Ask your doctor how much and what kind of exercise is best for you.  Go to the bathroom when you have the urge to poop.  Do not wait.  Avoid pushing too hard when you poop.  Keep your butt dry and clean. Use wet toilet paper or moist towelettes after pooping.  Do not sit on the toilet for a long time.  Keep all follow-up visits as told by your doctor. This is important. Contact a doctor if you:  Have pain and swelling that do not get better with treatment or medicine.  Have trouble pooping.  Cannot poop.  Have pain or swelling outside the area of the hemorrhoids. Get help right away if you have:  Bleeding that will not stop. Summary  Hemorrhoids are swollen veins in the butt or around the opening of the butt.  They can cause pain, itching, or bleeding.  Eat foods that have a lot of fiber in them. These include whole grains, beans, nuts, fruits, and vegetables.  Take a warm-water bath (sitz bath) for 20 minutes to ease pain. Do this 3-4 times a day. This information is not intended to replace advice given to you by your health care provider. Make sure you discuss any questions you have with your health care provider. Document Released: 02/01/2008 Document Revised: 05/02/2018 Document Reviewed: 09/13/2017 Elsevier Patient Education  2020 [employer].

## 2018-12-17 NOTE — Progress Notes (Signed)
Virtual Visit via Video Note  I connected with Tamara Ball  on 12/17/18 at 12:00 PM EDT by a video enabled telemedicine application and verified that I am speaking with the correct person using two identifiers.  HPI:  Acute visit for Constipation and Hemorrhoids: -she has long history of constipation -now on medications for weight reduction with wt management clinic and constipation is worse -can sometimes spend a long time on the toilet straining, flares in hemorrhoids, BRBPR with straining, pain sometimes -she wants referral to GI for this -she is treating with: fiber and an OTC stool softener and that has helped some, not using anything for the hemorroids  ROS: See pertinent positives and negatives per HPI.  Past Medical History:  Diagnosis Date  . Anemia   . Anxiety   . Chicken pox   . Chronic headaches   . Constipation   . Fibroids   . Iron deficiency anemia due to chronic blood loss    from heavy periods  . Menorrhagia   . Palpitations     No past surgical history on file.  Family History  Problem Relation Age of Onset  . Hypertension Mother   . Cancer Mother   . Diabetes Maternal Grandfather   . Hypertension Maternal Uncle     SOCIAL HX: see hpi   Current Outpatient Medications:  .  AVIANE 0.1-20 MG-MCG tablet, , Disp: , Rfl:  .  b complex vitamins tablet, Take 1 tablet by mouth daily., Disp: , Rfl:  .  Biotin 10000 MCG TABS, Take 1 tablet by mouth daily., Disp: , Rfl:  .  DYMISTA 137-50 MCG/ACT SUSP, , Disp: , Rfl:  .  Insulin Pen Needle (BD PEN NEEDLE NANO 2ND GEN) 32G X 4 MM MISC, 1 Package by Does not apply route 2 (two) times daily., Disp: 100 each, Rfl: 0 .  levocetirizine (XYZAL) 5 MG tablet, Take 1 tablet (5 mg total) by mouth every evening., Disp: 30 tablet, Rfl: 3 .  Liraglutide -Weight Management (SAXENDA) 18 MG/3ML SOPN, Inject 3 mg into the skin daily., Disp: 5 pen, Rfl: 0 .  montelukast (SINGULAIR) 10 MG tablet, Take 1 tablet (10 mg total) by mouth  at bedtime., Disp: 30 tablet, Rfl: 3 .  Multiple Vitamin (MULTIVITAMIN WITH MINERALS) TABS tablet, Take 1 tablet by mouth daily., Disp: , Rfl:  .  Omega-3 Fatty Acids (FISH OIL) 1000 MG CAPS, Take 1 capsule by mouth daily., Disp: , Rfl:  .  ondansetron (ZOFRAN) 4 MG tablet, Take 1 tablet (4 mg total) by mouth every 8 (eight) hours as needed for nausea or vomiting., Disp: 20 tablet, Rfl: 0 .  hydrocortisone (ANUSOL-HC) 2.5 % rectal cream, Place 1 application rectally 2 (two) times daily., Disp: 30 g, Rfl: 0  EXAM:  VITALS per patient if applicable:  GENERAL: alert, oriented, appears well and in no acute distress  HEENT: atraumatic, conjunttiva clear, no obvious abnormalities on inspection of external nose and ears  NECK: normal movements of the head and neck  LUNGS: on inspection no signs of respiratory distress, breathing rate appears normal, no obvious gross SOB, gasping or wheezing  CV: no obvious cyanosis  MS: moves all visible extremities without noticeable abnormality  PSYCH/NEURO: pleasant and cooperative, no obvious depression or anxiety, speech and thought processing grossly intact  ASSESSMENT AND PLAN:  Discussed the following assessment and plan:  Hemorrhoids, unspecified hemorrhoid type - Plan: Ambulatory referral to Gastroenterology Constipation, unspecified constipation type   -we discussed possible serious and likely etiologies,  workup and treatment, treatment risks and return precautions -after this discussion, Tamara Ball opted for: -referral to GI -tx constipation with daily fiber, mirilax or magnesium when constipated until soft stools for several weeks -anusol and sitz baths -follow up advised  if symptoms worsen or persist or new concerns arise.    Tamara Kern, DO    Patient Instructions  -We placed a referral for you as discussed. It usually takes about 1-2 weeks to process and schedule this referral. If you have not heard from [name] regarding this appointment  in 2 weeks please contact our office.  -daily fiber such as benafiber, metamucil, etc in full glass of liquid every morning.  -Mirilax 1 capful daily or Magnsium (CALM powder disolved in hot water then in tea or lemonade or Pure encapsulations - 1 tsp daily)  -Sitz baths and Anusol for hemorrhoids  I hope you are feeling better soon! Seek care promptly if your symptoms worsen, new concerns arise or you are not improving with treatment.    Hemorrhoids Hemorrhoids are swollen veins that may develop:  In the butt (rectum). These are called internal hemorrhoids.  Around the opening of the butt (anus). These are called external hemorrhoids. Hemorrhoids can cause pain, itching, or bleeding. Most of the time, they do not cause serious problems. They usually get better with diet changes, lifestyle changes, and other home treatments. What are the causes? This condition may be caused by:  Having trouble pooping (constipation).  Pushing hard (straining) to poop.  Watery poop (diarrhea).  Pregnancy.  Being very overweight (obese).  Sitting for long periods of time.  Heavy lifting or other activity that causes you to strain.  Anal sex.  Riding a bike for a long period of time. What are the signs or symptoms? Symptoms of this condition include:  Pain.  Itching or soreness in the butt.  Bleeding from the butt.  Leaking poop.  Swelling in the area.  One or more lumps around the opening of your butt. How is this diagnosed? A doctor can often diagnose this condition by looking at the affected area. The doctor may also:  Do an exam that involves feeling the area with a gloved hand (digital rectal exam).  Examine the area inside your butt using a small tube (anoscope).  Order blood tests. This may be done if you have lost a lot of blood.  Have you get a test that involves looking inside the colon using a flexible tube with a camera on the end (sigmoidoscopy or colonoscopy).  How is this treated? This condition can usually be treated at home. Your doctor may tell you to change what you eat, make lifestyle changes, or try home treatments. If these do not help, procedures can be done to remove the hemorrhoids or make them smaller. These may involve:  Placing rubber bands at the base of the hemorrhoids to cut off their blood supply.  Injecting medicine into the hemorrhoids to shrink them.  Shining a type of light energy onto the hemorrhoids to cause them to fall off.  Doing surgery to remove the hemorrhoids or cut off their blood supply. Follow these instructions at home: Eating and drinking   Eat foods that have a lot of fiber in them. These include whole grains, beans, nuts, fruits, and vegetables.  Ask your doctor about taking products that have added fiber (fibersupplements).  Reduce the amount of fat in your diet. You can do this by: ? Eating low-fat dairy products. ? Eating  less red meat. ? Avoiding processed foods.  Drink enough fluid to keep your pee (urine) pale yellow. Managing pain and swelling   Take a warm-water bath (sitz bath) for 20 minutes to ease pain. Do this 3-4 times a day. You may do this in a bathtub or using a portable sitz bath that fits over the toilet.  If told, put ice on the painful area. It may be helpful to use ice between your warm baths. ? Put ice in a plastic bag. ? Place a towel between your skin and the bag. ? Leave the ice on for 20 minutes, 2-3 times a day. General instructions  Take over-the-counter and prescription medicines only as told by your doctor. ? Medicated creams and medicines may be used as told.  Exercise often. Ask your doctor how much and what kind of exercise is best for you.  Go to the bathroom when you have the urge to poop. Do not wait.  Avoid pushing too hard when you poop.  Keep your butt dry and clean. Use wet toilet paper or moist towelettes after pooping.  Do not sit on the toilet  for a long time.  Keep all follow-up visits as told by your doctor. This is important. Contact a doctor if you:  Have pain and swelling that do not get better with treatment or medicine.  Have trouble pooping.  Cannot poop.  Have pain or swelling outside the area of the hemorrhoids. Get help right away if you have:  Bleeding that will not stop. Summary  Hemorrhoids are swollen veins in the butt or around the opening of the butt.  They can cause pain, itching, or bleeding.  Eat foods that have a lot of fiber in them. These include whole grains, beans, nuts, fruits, and vegetables.  Take a warm-water bath (sitz bath) for 20 minutes to ease pain. Do this 3-4 times a day. This information is not intended to replace advice given to you by your health care provider. Make sure you discuss any questions you have with your health care provider. Document Released: 02/01/2008 Document Revised: 05/02/2018 Document Reviewed: 09/13/2017 Elsevier Patient Education  2020 [employer].

## 2018-12-23 ENCOUNTER — Ambulatory Visit (INDEPENDENT_AMBULATORY_CARE_PROVIDER_SITE_OTHER): Payer: BC Managed Care – PPO | Admitting: Bariatrics

## 2018-12-23 ENCOUNTER — Other Ambulatory Visit: Payer: Self-pay

## 2018-12-23 ENCOUNTER — Encounter (INDEPENDENT_AMBULATORY_CARE_PROVIDER_SITE_OTHER): Payer: Self-pay | Admitting: Bariatrics

## 2018-12-23 VITALS — BP 113/73 | HR 69 | Temp 98.5°F | Ht 66.0 in | Wt 183.0 lb

## 2018-12-23 DIAGNOSIS — E559 Vitamin D deficiency, unspecified: Secondary | ICD-10-CM

## 2018-12-23 DIAGNOSIS — D508 Other iron deficiency anemias: Secondary | ICD-10-CM | POA: Diagnosis not present

## 2018-12-23 DIAGNOSIS — Z6830 Body mass index (BMI) 30.0-30.9, adult: Secondary | ICD-10-CM

## 2018-12-23 DIAGNOSIS — E669 Obesity, unspecified: Secondary | ICD-10-CM | POA: Diagnosis not present

## 2018-12-25 ENCOUNTER — Encounter (INDEPENDENT_AMBULATORY_CARE_PROVIDER_SITE_OTHER): Payer: Self-pay | Admitting: Bariatrics

## 2018-12-25 NOTE — Progress Notes (Signed)
Office: [phone]  /  Fax: [phone]   HPI:   Chief Complaint: OBESITY Tamara Ball is here to discuss her progress with her obesity treatment plan. She is on the Category 4 plan and is following her eating plan approximately 85 % of the time. She states she is exercising 0 minutes 0 times per week. [name] is taking [name]. She is up 1 pound. [name] is doing well overall. She is up 3 pounds of water weight according to the bioimpendence scale  (on her cycle). Her goal weight is 170 pounds. Her weight is 183 lb (83 kg) today and has had a weight gain of 1 pound over a period of 2 weeks since her last visit. She has lost 22 lbs since starting treatment with [name].  Vitamin D deficiency [name] has a diagnosis of vitamin D deficiency. She is currently taking vit D and her last vitamin D level was at 40.4 [name] denies nausea, vomiting or muscle weakness.  Iron Deficiency Anemia [name] has a diagnosis of anemia and she gets iron infusions. Her energy has improved.  ASSESSMENT AND PLAN:  Vitamin D deficiency  Other iron deficiency anemia  Class 1 obesity with serious comorbidity and body mass index (BMI) of 30.0 to 30.9 in adult, unspecified obesity type - BMI greater than 30 at start of program   PLAN:  Vitamin D Deficiency [name] was informed that low vitamin D levels contributes to fatigue and are associated with obesity, breast, and colon cancer. She agrees to continue to take prescription Vit D @50 ,000 IU every week and will follow up for routine testing of vitamin D, at least 2-3 times per year. She was informed of the risk of over-replacement of vitamin D and agrees to not increase her dose unless she discusses this with [name] first.  Iron Deficiency Anemia We will continue to monitor and [name] will follow up at the agreed upon time.  I spent > than 50% of the 15 minute visit on counseling as documented in the note.  Obesity [name] is currently in the action stage of change. As  such, her goal is to continue with weight loss efforts She has agreed to follow the Category 4 plan [name] has been instructed to work up to a goal of 150 minutes of combined cardio and strengthening exercise per week for weight loss and overall health benefits. We discussed the following Behavioral Modification Strategies today: increase H2O intake, no skipping meals, keeping healthy foods in the home, increasing lean protein intake, decreasing simple carbohydrates, increasing vegetables, decrease eating out and work on meal planning and intentional eating  [name] has agreed to follow up with our clinic in 2 weeks. She was informed of the importance of frequent follow up visits to maximize her success with intensive lifestyle modifications for her multiple health conditions.  ALLERGIES: No Known Allergies  MEDICATIONS: Current Outpatient Medications on File Prior to Visit  Medication Sig Dispense Refill   AVIANE 0.1-20 MG-MCG tablet      b complex vitamins tablet Take 1 tablet by mouth daily.     Biotin 10000 MCG TABS Take 1 tablet by mouth daily.     DYMISTA 137-50 MCG/ACT SUSP      hydrocortisone (ANUSOL-HC) 2.5 % rectal cream Place 1 application rectally 2 (two) times daily. 30 g 0   Insulin Pen Needle (BD PEN NEEDLE NANO 2ND GEN) 32G X 4 MM MISC 1 Package by Does not apply route 2 (two) times daily. 100 each 0  levocetirizine (XYZAL) 5 MG tablet Take 1 tablet (5 mg total) by mouth every evening. 30 tablet 3   Liraglutide -Weight Management (SAXENDA) 18 MG/3ML SOPN Inject 3 mg into the skin daily. 5 pen 0   montelukast (SINGULAIR) 10 MG tablet Take 1 tablet (10 mg total) by mouth at bedtime. 30 tablet 3   Multiple Vitamin (MULTIVITAMIN WITH MINERALS) TABS tablet Take 1 tablet by mouth daily.     Omega-3 Fatty Acids (FISH OIL) 1000 MG CAPS Take 1 capsule by mouth daily.     ondansetron (ZOFRAN) 4 MG tablet Take 1 tablet (4 mg total) by mouth every 8 (eight) hours as needed for  nausea or vomiting. 20 tablet 0   No current facility-administered medications on file prior to visit.     PAST MEDICAL HISTORY: Past Medical History:  Diagnosis Date   Anemia    Anxiety    Chicken pox    Chronic headaches    Constipation    Fibroids    Iron deficiency anemia due to chronic blood loss    from heavy periods   Menorrhagia    Palpitations     PAST SURGICAL HISTORY: History reviewed. No pertinent surgical history.  SOCIAL HISTORY: Social History   Tobacco Use   Smoking status: Never Smoker   Smokeless tobacco: Never Used  Substance Use Topics   Alcohol use: Yes    Alcohol/week: 0.0 standard drinks    Comment: social   Drug use: No    FAMILY HISTORY: Family History  Problem Relation Age of Onset   Hypertension Mother    Cancer Mother    Diabetes Maternal Grandfather    Hypertension Maternal Uncle     ROS: Review of Systems  Constitutional: Negative for malaise/fatigue and weight loss.  Gastrointestinal: Negative for nausea and vomiting.  Musculoskeletal:       Negative for muscle weakness    PHYSICAL EXAM: Blood pressure 113/73, pulse 69, temperature 98.5 F (36.9 C), temperature source Oral, height 5\' 6"  (1.676 m), weight 183 lb (83 kg), SpO2 99 %. Body mass index is 29.54 kg/m. Physical Exam Vitals signs reviewed.  Constitutional:      Appearance: Normal appearance. She is well-developed. She is obese.  Cardiovascular:     Rate and Rhythm: Normal rate.  Pulmonary:     Effort: Pulmonary effort is normal.  Musculoskeletal: Normal range of motion.  Skin:    General: Skin is warm and dry.  Neurological:     Mental Status: She is alert and oriented to person, place, and time.  Psychiatric:        Mood and Affect: Mood normal.        Behavior: Behavior normal.     RECENT LABS AND TESTS: BMET    Component Value Date/Time   NA 140 10/16/2016 1853   NA 136 08/11/2016 1404   K 3.9 10/16/2016 1853   K 4.2  08/11/2016 1404   CL 107 10/16/2016 1853   CL 103 08/11/2016 1404   CO2 25 10/16/2016 1853   CO2 26 08/11/2016 1404   GLUCOSE 103 (H) 10/16/2016 1853   BUN 15 10/16/2016 1853   BUN 13 08/11/2016 1404   CREATININE 0.85 10/16/2016 1853   CREATININE 0.87 08/11/2016 1404   CALCIUM 9.1 10/16/2016 1853   CALCIUM 9.5 08/11/2016 1404   GFRNONAA >60 10/16/2016 1853   GFRAA >60 10/16/2016 1853   Lab Results  Component Value Date   HGBA1C 5.0 05/27/2018   HGBA1C 5.1 05/22/2017  HGBA1C 5.1 05/16/2016   HGBA1C 5.0 04/07/2015   Lab Results  Component Value Date   INSULIN 8.9 06/13/2018   CBC    Component Value Date/Time   WBC 8.1 11/27/2018 1044   WBC 8.2 10/16/2016 1853   RBC 5.04 11/27/2018 1044   HGB 14.8 11/27/2018 1044   HGB 14.2 04/20/2017 1524   HCT 46.0 11/27/2018 1044   HCT 43.2 04/20/2017 1524   PLT 211 11/27/2018 1044   PLT 199 04/20/2017 1524   MCV 91.3 11/27/2018 1044   MCV 90 04/20/2017 1524   MCH 29.4 11/27/2018 1044   MCHC 32.2 11/27/2018 1044   RDW 12.8 11/27/2018 1044   RDW 13.5 04/20/2017 1524   LYMPHSABS 1.5 11/27/2018 1044   LYMPHSABS 1.6 04/20/2017 1524   MONOABS 0.4 11/27/2018 1044   EOSABS 0.4 11/27/2018 1044   EOSABS 0.4 04/20/2017 1524   BASOSABS 0.0 11/27/2018 1044   BASOSABS 0.1 04/20/2017 1524   Iron/TIBC/Ferritin/ %Sat    Component Value Date/Time   IRON 52 11/27/2018 1044   IRON 103 04/20/2017 1524   TIBC 448 (H) 11/27/2018 1044   TIBC 450 (H) 04/20/2017 1524   FERRITIN 43 11/27/2018 1044   FERRITIN 23 04/20/2017 1524   IRONPCTSAT 12 (L) 11/27/2018 1044   IRONPCTSAT 23 04/20/2017 1524   IRONPCTSAT 18 (L) 10/30/2014 1213   Lipid Panel     Component Value Date/Time   CHOL 119 05/27/2018 0848   TRIG 52.0 05/27/2018 0848   HDL 50.60 05/27/2018 0848   CHOLHDL 2 05/27/2018 0848   VLDL 10.4 05/27/2018 0848   LDLCALC 58 05/27/2018 0848   Hepatic Function Panel     Component Value Date/Time   PROT 7.3 08/11/2016 1404   ALBUMIN  4.2 08/11/2016 1404   AST 9 08/11/2016 1404   ALT 8 08/11/2016 1404   ALKPHOS 58 08/11/2016 1404   BILITOT 0.5 08/11/2016 1404   BILIDIR 0.0 06/24/2012 1055      Component Value Date/Time   TSH 2.250 06/13/2018 1123   TSH 1.28 01/26/2016 1358   TSH 2.46 04/28/2014 0957     Ref. Range 06/13/2018 11:23  Vitamin D, 25-Hydroxy Latest Ref Range: 30.0 - 100.0 ng/mL 40.4    OBESITY BEHAVIORAL INTERVENTION VISIT  Today's visit was # 8   Starting weight: 205 lbs Starting date: 06/13/2018 Today's weight : 183 lbs Today's date: 12/23/2018 Total lbs lost to date: 22    12/23/2018  Height 5\' 6"  (1.676 m)  Weight 183 lb (83 kg)  BMI (Calculated) 29.55  BLOOD PRESSURE - SYSTOLIC 284  BLOOD PRESSURE - DIASTOLIC 73   Body Fat % 13.2 %  Total Body Water (lbs) 77.6 lbs    ASK: We discussed the diagnosis of obesity with Mayo Ao today and [name] agreed to give [name] permission to discuss obesity behavioral modification therapy today.  ASSESS: Kerston has the diagnosis of obesity and her BMI today is 29.55 Jasminemarie is in the action stage of change   ADVISE: Cortnee was educated on the multiple health risks of obesity as well as the benefit of weight loss to improve her health. She was advised of the need for long term treatment and the importance of lifestyle modifications to improve her current health and to decrease her risk of future health problems.  AGREE: Multiple dietary modification options and treatment options were discussed and  Alaylah agreed to follow the recommendations documented in the above note.  ARRANGE: Keslie was educated on the importance of frequent  visits to treat obesity as outlined per CMS and USPSTF guidelines and agreed to schedule her next follow up appointment today.  Corey Skains, am acting as Location manager for [employer]. Owens Shark, DO  I have reviewed the above documentation for accuracy and completeness, and I agree with the above. -Jearld Lesch, DO

## 2019-01-06 ENCOUNTER — Encounter (INDEPENDENT_AMBULATORY_CARE_PROVIDER_SITE_OTHER): Payer: Self-pay | Admitting: Bariatrics

## 2019-01-06 ENCOUNTER — Encounter (INDEPENDENT_AMBULATORY_CARE_PROVIDER_SITE_OTHER): Payer: Self-pay

## 2019-01-06 ENCOUNTER — Ambulatory Visit (INDEPENDENT_AMBULATORY_CARE_PROVIDER_SITE_OTHER): Payer: BC Managed Care – PPO | Admitting: Bariatrics

## 2019-01-06 ENCOUNTER — Other Ambulatory Visit: Payer: Self-pay

## 2019-01-06 VITALS — BP 107/61 | HR 78 | Temp 98.5°F | Ht 66.0 in | Wt 184.0 lb

## 2019-01-06 DIAGNOSIS — E669 Obesity, unspecified: Secondary | ICD-10-CM

## 2019-01-06 DIAGNOSIS — E559 Vitamin D deficiency, unspecified: Secondary | ICD-10-CM

## 2019-01-06 DIAGNOSIS — Z6830 Body mass index (BMI) 30.0-30.9, adult: Secondary | ICD-10-CM

## 2019-01-06 DIAGNOSIS — D508 Other iron deficiency anemias: Secondary | ICD-10-CM | POA: Diagnosis not present

## 2019-01-06 DIAGNOSIS — Z9189 Other specified personal risk factors, not elsewhere classified: Secondary | ICD-10-CM

## 2019-01-06 MED ORDER — SAXENDA 18 MG/3ML ~~LOC~~ SOPN: 3.0000 mg | PEN_INJECTOR | Freq: Every day | SUBCUTANEOUS | 0 refills | Status: DC

## 2019-01-07 NOTE — Progress Notes (Signed)
Office: [phone]  /  Fax: [phone]   HPI:   Chief Complaint: OBESITY Tamara Ball is here to discuss her progress with her obesity treatment plan. She is on the Category 3 plan and is following her eating plan approximately 85 % of the time. She states she is exercising 0 minutes 0 times per week. [name] is eating out a lot, but she states she is making good choices. She is up 1 pound. Tamara Ball is taking Tamara Ball. Her weight is 184 lb (83.5 kg) today and has had a weight gain of 1 pound over a period of 2 weeks since her last visit. She has lost 21 lbs since starting treatment with Tamara Ball.  Vitamin D deficiency Tamara Ball has a diagnosis of vitamin D deficiency. She is currently taking vit D and denies nausea, vomiting or muscle weakness.  At risk for osteopenia and osteoporosis Tamara Ball is at higher risk of osteopenia and osteoporosis due to vitamin D deficiency.   Other Iron Deficiency Anemia Tamara Ball has a diagnosis of anemia and she gets periodic iron infusions.   ASSESSMENT AND PLAN:  Vitamin D deficiency  Other iron deficiency anemia  At risk for osteoporosis  Class 1 obesity with serious comorbidity and body mass index (BMI) of 30.0 to 30.9 in adult, unspecified obesity type - BMI was greater than 30 at start of program - Plan: Liraglutide -Weight Management (SAXENDA) 18 MG/3ML SOPN  PLAN:  Vitamin D Deficiency Tamara Ball was informed that low vitamin D levels contributes to fatigue and are associated with obesity, breast, and colon cancer. She agrees to continue to take prescription Vit D @50 ,000 IU every week and will follow up for routine testing of vitamin D, at least 2-3 times per year. She was informed of the risk of over-replacement of vitamin D and agrees to not increase her dose unless she discusses this with Tamara Ball first.  At risk for osteopenia and osteoporosis Tamara Ball was given extended  (15 minutes) osteoporosis prevention counseling today. Tamara Ball is at risk for osteopenia and  osteoporosis due to her vitamin D deficiency. She was encouraged to take her vitamin D and follow her higher calcium diet and increase strengthening exercise to help strengthen her bones and decrease her risk of osteopenia and osteoporosis.  Other Iron Deficiency Anemia Tamara Ball will continue to follow up with her PCP and hematology as needed. She will follow up with our clinic in 2 weeks.  Obesity Tamara Ball is currently in the action stage of change. As such, her goal is to continue with weight loss efforts She has agreed to follow the Category 3 plan+100 calories Tamara Ball has been instructed to work up to a goal of 150 minutes of combined cardio and strengthening exercise per week for weight loss and overall health benefits. We discussed the following Behavioral Modification Strategies today: increase H2O intake, no skipping meals, keeping healthy foods in the home, increasing lean protein intake, decreasing simple carbohydrates, increasing vegetables, decrease eating out, work on meal planning and intentional eating, decrease calories, dealing with family or coworker sabotage, travel eating strategies, celebration eating strategies and holiday eating strategies  Tamara Ball agrees to continue Saxenda 3 mg into skin sub Q #5 pens with no refills.  Tamara Ball has agreed to follow up with our clinic in 2 weeks. She was informed of the importance of frequent follow up visits to maximize her success with intensive lifestyle modifications for her multiple health conditions.  ALLERGIES: No Known Allergies  MEDICATIONS: Current Outpatient Medications on File Prior to Visit  Medication Sig Dispense Refill  . AVIANE 0.1-20 MG-MCG tablet     . b complex vitamins tablet Take 1 tablet by mouth daily.    . Biotin 10000 MCG TABS Take 1 tablet by mouth daily.    Marland Kitchen DYMISTA 137-50 MCG/ACT SUSP     . hydrocortisone (ANUSOL-HC) 2.5 % rectal cream Place 1 application rectally 2 (two) times daily. 30 g 0  . Insulin Pen  Needle (BD PEN NEEDLE NANO 2ND GEN) 32G X 4 MM MISC 1 Package by Does not apply route 2 (two) times daily. 100 each 0  . levocetirizine (XYZAL) 5 MG tablet Take 1 tablet (5 mg total) by mouth every evening. 30 tablet 3  . montelukast (SINGULAIR) 10 MG tablet Take 1 tablet (10 mg total) by mouth at bedtime. 30 tablet 3  . Multiple Vitamin (MULTIVITAMIN WITH MINERALS) TABS tablet Take 1 tablet by mouth daily.    . Omega-3 Fatty Acids (FISH OIL) 1000 MG CAPS Take 1 capsule by mouth daily.    . ondansetron (ZOFRAN) 4 MG tablet Take 1 tablet (4 mg total) by mouth every 8 (eight) hours as needed for nausea or vomiting. 20 tablet 0   No current facility-administered medications on file prior to visit.     PAST MEDICAL HISTORY: Past Medical History:  Diagnosis Date  . Anemia   . Anxiety   . Chicken pox   . Chronic headaches   . Constipation   . Fibroids   . Iron deficiency anemia due to chronic blood loss    from heavy periods  . Menorrhagia   . Palpitations     PAST SURGICAL HISTORY: History reviewed. No pertinent surgical history.  SOCIAL HISTORY: Social History   Tobacco Use  . Smoking status: Never Smoker  . Smokeless tobacco: Never Used  Substance Use Topics  . Alcohol use: Yes    Alcohol/week: 0.0 standard drinks    Comment: social  . Drug use: No    FAMILY HISTORY: Family History  Problem Relation Age of Onset  . Hypertension Mother   . Cancer Mother   . Diabetes Maternal Grandfather   . Hypertension Maternal Uncle     ROS: Review of Systems  Constitutional: Negative for weight loss.  Gastrointestinal: Negative for nausea and vomiting.  Musculoskeletal:       Negative for muscle weakness    PHYSICAL EXAM: Blood pressure 107/61, pulse 78, temperature 98.5 F (36.9 C), temperature source Oral, height 5\' 6"  (1.676 m), weight 184 lb (83.5 kg), SpO2 99 %. Body mass index is 29.7 kg/m. Physical Exam Vitals signs reviewed.  Constitutional:      Appearance:  Normal appearance. She is well-developed. She is obese.  Cardiovascular:     Rate and Rhythm: Normal rate.  Pulmonary:     Effort: Pulmonary effort is normal.  Musculoskeletal: Normal range of motion.  Skin:    Tamara: Skin is warm and dry.  Neurological:     Mental Status: She is alert and oriented to person, place, and time.  Psychiatric:        Mood and Affect: Mood normal.        Behavior: Behavior normal.     RECENT LABS AND TESTS: BMET    Component Value Date/Time   NA 140 10/16/2016 1853   NA 136 08/11/2016 1404   K 3.9 10/16/2016 1853   K 4.2 08/11/2016 1404   CL 107 10/16/2016 1853   CL 103 08/11/2016 1404   CO2 25 10/16/2016 1853  CO2 26 08/11/2016 1404   GLUCOSE 103 (H) 10/16/2016 1853   BUN 15 10/16/2016 1853   BUN 13 08/11/2016 1404   CREATININE 0.85 10/16/2016 1853   CREATININE 0.87 08/11/2016 1404   CALCIUM 9.1 10/16/2016 1853   CALCIUM 9.5 08/11/2016 1404   GFRNONAA >60 10/16/2016 1853   GFRAA >60 10/16/2016 1853   Lab Results  Component Value Date   HGBA1C 5.0 05/27/2018   HGBA1C 5.1 05/22/2017   HGBA1C 5.1 05/16/2016   HGBA1C 5.0 04/07/2015   Lab Results  Component Value Date   INSULIN 8.9 06/13/2018   CBC    Component Value Date/Time   WBC 8.1 11/27/2018 1044   WBC 8.2 10/16/2016 1853   RBC 5.04 11/27/2018 1044   HGB 14.8 11/27/2018 1044   HGB 14.2 04/20/2017 1524   HCT 46.0 11/27/2018 1044   HCT 43.2 04/20/2017 1524   PLT 211 11/27/2018 1044   PLT 199 04/20/2017 1524   MCV 91.3 11/27/2018 1044   MCV 90 04/20/2017 1524   MCH 29.4 11/27/2018 1044   MCHC 32.2 11/27/2018 1044   RDW 12.8 11/27/2018 1044   RDW 13.5 04/20/2017 1524   LYMPHSABS 1.5 11/27/2018 1044   LYMPHSABS 1.6 04/20/2017 1524   MONOABS 0.4 11/27/2018 1044   EOSABS 0.4 11/27/2018 1044   EOSABS 0.4 04/20/2017 1524   BASOSABS 0.0 11/27/2018 1044   BASOSABS 0.1 04/20/2017 1524   Iron/TIBC/Ferritin/ %Sat    Component Value Date/Time   IRON 52 11/27/2018 1044    IRON 103 04/20/2017 1524   TIBC 448 (H) 11/27/2018 1044   TIBC 450 (H) 04/20/2017 1524   FERRITIN 43 11/27/2018 1044   FERRITIN 23 04/20/2017 1524   IRONPCTSAT 12 (L) 11/27/2018 1044   IRONPCTSAT 23 04/20/2017 1524   IRONPCTSAT 18 (L) 10/30/2014 1213   Lipid Panel     Component Value Date/Time   CHOL 119 05/27/2018 0848   TRIG 52.0 05/27/2018 0848   HDL 50.60 05/27/2018 0848   CHOLHDL 2 05/27/2018 0848   VLDL 10.4 05/27/2018 0848   LDLCALC 58 05/27/2018 0848   Hepatic Function Panel     Component Value Date/Time   PROT 7.3 08/11/2016 1404   ALBUMIN 4.2 08/11/2016 1404   AST 9 08/11/2016 1404   ALT 8 08/11/2016 1404   ALKPHOS 58 08/11/2016 1404   BILITOT 0.5 08/11/2016 1404   BILIDIR 0.0 06/24/2012 1055      Component Value Date/Time   TSH 2.250 06/13/2018 1123   TSH 1.28 01/26/2016 1358   TSH 2.46 04/28/2014 0957     Ref. Range 06/13/2018 11:23  Vitamin D, 25-Hydroxy Latest Ref Range: 30.0 - 100.0 ng/mL 40.4    OBESITY BEHAVIORAL INTERVENTION VISIT  Today's visit was # 9   Starting weight: 205 lbs Starting date: 06/13/2018 Today's weight : 184 lbs Today's date: 01/06/2019 Total lbs lost to date: 21    01/06/2019  Height 5\' 6"  (1.676 m)  Weight 184 lb (83.5 kg)  BMI (Calculated) 29.71  BLOOD PRESSURE - SYSTOLIC [id number]  BLOOD PRESSURE - DIASTOLIC 61   Body Fat % [id number] %  Total Body Water (lbs) 77.6 lbs    ASK: We discussed the diagnosis of obesity with Tamara Ball today and Tamara Ball agreed to give Tamara Ball permission to discuss obesity behavioral modification therapy today.  ASSESS: Tamara Ball has the diagnosis of obesity and her BMI today is 29.71 Tamara Ball is in the action stage of change   ADVISE: Tamara Ball was educated on the multiple health  risks of obesity as well as the benefit of weight loss to improve her health. She was advised of the need for long term treatment and the importance of lifestyle modifications to improve her current health and to decrease her risk of  future health problems.  AGREE: Multiple dietary modification options and treatment options were discussed and  Tamara Ball agreed to follow the recommendations documented in the above note.  ARRANGE: Tamara Ball was educated on the importance of frequent visits to treat obesity as outlined per CMS and USPSTF guidelines and agreed to schedule her next follow up appointment today.  Tamara Ball, am acting as Location manager for Tamara Ball. Tamara Shark, DO  I have reviewed the above documentation for accuracy and completeness, and I agree with the above. -Tamara Lesch, DO

## 2019-01-08 ENCOUNTER — Encounter (INDEPENDENT_AMBULATORY_CARE_PROVIDER_SITE_OTHER): Payer: Self-pay | Admitting: Bariatrics

## 2019-01-13 ENCOUNTER — Encounter: Payer: Self-pay | Admitting: Family Medicine

## 2019-01-14 ENCOUNTER — Other Ambulatory Visit: Payer: Self-pay | Admitting: Family Medicine

## 2019-01-14 MED ORDER — DYMISTA 137-50 MCG/ACT NA SUSP: NASAL | 3 refills | Status: DC

## 2019-01-23 ENCOUNTER — Ambulatory Visit: Payer: BC Managed Care – PPO | Admitting: Gastroenterology

## 2019-01-23 ENCOUNTER — Other Ambulatory Visit: Payer: Self-pay

## 2019-01-23 ENCOUNTER — Encounter: Payer: Self-pay | Admitting: Gastroenterology

## 2019-01-23 VITALS — BP 102/60 | HR 89 | Temp 97.6°F | Ht 66.0 in | Wt 183.0 lb

## 2019-01-23 DIAGNOSIS — K6289 Other specified diseases of anus and rectum: Secondary | ICD-10-CM

## 2019-01-23 DIAGNOSIS — K625 Hemorrhage of anus and rectum: Secondary | ICD-10-CM | POA: Diagnosis not present

## 2019-01-23 MED ORDER — NA SULFATE-K SULFATE-MG SULF 17.5-3.13-1.6 GM/177ML PO SOLN: 1.0000 | ORAL | 0 refills | Status: AC

## 2019-01-23 NOTE — Patient Instructions (Addendum)
High fiber diet recommended. Please drink at least 1.5-2 liters of water.  Daily stool bulking agent with psyllium or methylcellulose recommended.  Use Miralax daily as needed to insure a soft bowel movement daily.  Continue to use Anusol HC 2.5% applied sparingly twice daily as needed.  Colonoscopy recommended.  Please call with any questions or concerns prior to that time.   Fiber Chart  You should be consuming 25-30g of fiber per day and drinking 8 glasses of water to help your bowels move regularly.  In the chart below you can look up how much fiber you are getting in an average day.  If you are not getting enough fiber, you should add a fiber supplement to your diet.  Examples of this include Metamucil, FiberCon and Citrucel.  These can be purchased at your local grocery store or pharmacy.      [email or web address]

## 2019-01-23 NOTE — Progress Notes (Signed)
Referring Provider: [name], DO Primary Care Physician:  [name], DO  Reason for Consultation:  Hemorrhoids   IMPRESSION:  Rectal pain and bleeding Chronic constipation  The differential for rectal pain and bleeding is broad.  It includes outlet sources such as fissure or hemorrhoids, but we must also consider polyps, mass, ulcers, and colitis.  Given this differential I am recommending a colonoscopy.  PLAN: High fiber diet recommended, drink at least 1.5-2 liters of water Daily stool bulking agent with psyllium or methylcellulose recommended Use Miralax daily as needed Continue Anusol HC 2.5% applied sparingly PR BID Colonoscopy  I consented the patient discussing the risks, benefits, and alternatives to endoscopic evaluation. In particular, we discussed the risks that include, but are not limited to, reaction to medication, cardiopulmonary compromise, bleeding requiring blood transfusion, aspiration resulting in pneumonia, perforation requiring surgery, lack of diagnosis, severe illness requiring hospitalization, and even death. We reviewed the risk of missed lesion including polyps or even cancer. The patient acknowledges these risks and asks that we proceed.  Please see the "Patient Instructions" section for addition details about the plan.  HPI: Tamara Ball is a 38 y.o. female referred by Dr. Colin Benton for further evaluation of hemorrhoids.  The history is obtained to the patient and review of her electronic health record.  She has obesity, vitamin D deficiency, and iron deficiency anemia due to menorrhagia requiring IV iron infusions with Hematology twice year.    She is a longstanding history of constipation that is recently exacerbated by [name] that she is using for weight management. Will often go 2-3 days between bowel movements. Stools are pellets.  She notes flare of her hemorrhoids with straining.  No complete relief with fiber and OTC stool softener.  Dr.  [name] recommended daily fiber, MiraLAX, or magnesium when constipated.  She also prescribed Anusol and sitz bath's.  Associated pain and bleeding with bleeding that can be so bad that she avoids having bowel movements. Pain with defecation sometimes hurts even more than labor. No history of rectal foreign body or trauma.  No abdominal pain, diarrhea, gas, or bloating.  Currently uses Fibercon QAM at the same time that she takes her other medications each day. She often forgets to take Miralax every day. No other associated symptoms. No identified exacerbating or relieving features.   No prior endoscopic evaluation.   Mom with metastatic melanoma. Aunt with anal cancer in the setting of HIV and she understands this was due to medications. Father's family history is unknown. No known family history of colon cancer or polyps. No family history of uterine/endometrial cancer, pancreatic cancer or gastric/stomach cancer.   Past Medical History:  Diagnosis Date  . Anemia   . Anxiety   . Chicken pox   . Chronic headaches   . Constipation   . Fibroids   . Iron deficiency anemia due to chronic blood loss    from heavy periods  . Menorrhagia   . Palpitations     No past surgical history on file.  Current Outpatient Medications  Medication Sig Dispense Refill  . AVIANE 0.1-20 MG-MCG tablet     . b complex vitamins tablet Take 1 tablet by mouth daily.    . Biotin 10000 MCG TABS Take 1 tablet by mouth daily.    Marland Kitchen DYMISTA 137-50 MCG/ACT SUSP 1 actuation each nostril twice daily 23 g 3  . hydrocortisone (ANUSOL-HC) 2.5 % rectal cream Place 1 application rectally 2 (two) times daily.  30 g 0  . Insulin Pen Needle (BD PEN NEEDLE NANO 2ND GEN) 32G X 4 MM MISC 1 Package by Does not apply route 2 (two) times daily. 100 each 0  . levocetirizine (XYZAL) 5 MG tablet Take 1 tablet (5 mg total) by mouth every evening. 30 tablet 3  . Liraglutide -Weight Management (SAXENDA) 18 MG/3ML SOPN Inject 3 mg into the  skin daily. 5 pen 0  . montelukast (SINGULAIR) 10 MG tablet Take 1 tablet (10 mg total) by mouth at bedtime. 30 tablet 3  . Multiple Vitamin (MULTIVITAMIN WITH MINERALS) TABS tablet Take 1 tablet by mouth daily.    . Omega-3 Fatty Acids (FISH OIL) 1000 MG CAPS Take 1 capsule by mouth daily.    . ondansetron (ZOFRAN) 4 MG tablet Take 1 tablet (4 mg total) by mouth every 8 (eight) hours as needed for nausea or vomiting. 20 tablet 0   No current facility-administered medications for this visit.     Allergies as of 01/23/2019  . (No Known Allergies)    Family History  Problem Relation Age of Onset  . Hypertension Mother   . Cancer Mother   . Diabetes Maternal Grandfather   . Hypertension Maternal Uncle     Social History   Socioeconomic History  . Marital status: Single    Spouse name: Not on file  . Number of children: Not on file  . Years of education: Not on file  . Highest education level: Not on file  Occupational History  . Occupation: Scientist, physiological  Social Needs  . Financial resource strain: Not on file  . Food insecurity    Worry: Not on file    Inability: Not on file  . Transportation needs    Medical: Not on file    Non-medical: Not on file  Tobacco Use  . Smoking status: Never Smoker  . Smokeless tobacco: Never Used  Substance and Sexual Activity  . Alcohol use: Yes    Alcohol/week: 0.0 standard drinks    Comment: social  . Drug use: No  . Sexual activity: Not on file  Lifestyle  . Physical activity    Days per week: Not on file    Minutes per session: Not on file  . Stress: Not on file  Relationships  . Social Herbalist on phone: Not on file    Gets together: Not on file    Attends religious service: Not on file    Active member of club or organization: Not on file    Attends meetings of clubs or organizations: Not on file    Relationship status: Not on file  . Intimate partner violence    Fear of current or ex partner: Not on file     Emotionally abused: Not on file    Physically abused: Not on file    Forced sexual activity: Not on file  Other Topics Concern  . Not on file  Social History Narrative   Work or School: higher education - Engineer, site Situation: lives with 40 yo      Spiritual Beliefs: Christian      Lifestyle: no regular exercise; diet is so so       Review of Systems: 12 system ROS is negative except as noted above except for headaches.   Physical Exam: General:   Alert,  well-nourished, pleasant and cooperative in NAD Head:  Normocephalic and atraumatic. Eyes:  Sclera clear, no icterus.  Conjunctiva pink. Ears:  Normal auditory acuity. Nose:  No deformity, discharge,  or lesions. Mouth:  No deformity or lesions.   Neck:  Supple; no masses or thyromegaly. Lungs:  Clear throughout to auscultation.   No wheezes. Heart:  Regular rate and rhythm; no murmurs. Abdomen:  Soft,nontender, nondistended, normal bowel sounds, no rebound or guarding. No hepatosplenomegaly.   Rectal:  Deferred until time of colonoscopy.  Msk:  Symmetrical. No boney deformities LAD: No inguinal or umbilical LAD Extremities:  No clubbing or edema. Neurologic:  Alert and  oriented x4;  grossly nonfocal Skin:  Intact without significant lesions or rashes. Psych:  Alert and cooperative. Normal mood and affect.     Deiona Hooper L. Tarri Glenn, MD, MPH 01/23/2019, 8:22 AM

## 2019-01-27 ENCOUNTER — Encounter (INDEPENDENT_AMBULATORY_CARE_PROVIDER_SITE_OTHER): Payer: Self-pay | Admitting: Bariatrics

## 2019-01-27 ENCOUNTER — Encounter: Payer: Self-pay | Admitting: Bariatrics

## 2019-01-27 ENCOUNTER — Ambulatory Visit (INDEPENDENT_AMBULATORY_CARE_PROVIDER_SITE_OTHER): Payer: BC Managed Care – PPO | Admitting: Bariatrics

## 2019-01-27 ENCOUNTER — Other Ambulatory Visit: Payer: Self-pay

## 2019-01-27 VITALS — BP 103/64 | HR 83 | Temp 98.1°F | Ht 66.0 in | Wt 180.0 lb

## 2019-01-27 DIAGNOSIS — R0602 Shortness of breath: Secondary | ICD-10-CM

## 2019-01-27 DIAGNOSIS — E559 Vitamin D deficiency, unspecified: Secondary | ICD-10-CM | POA: Diagnosis not present

## 2019-01-27 DIAGNOSIS — E66811 Obesity, class 1: Secondary | ICD-10-CM

## 2019-01-27 DIAGNOSIS — R5383 Other fatigue: Secondary | ICD-10-CM

## 2019-01-27 DIAGNOSIS — E669 Obesity, unspecified: Secondary | ICD-10-CM

## 2019-01-27 DIAGNOSIS — Z9189 Other specified personal risk factors, not elsewhere classified: Secondary | ICD-10-CM | POA: Diagnosis not present

## 2019-01-27 DIAGNOSIS — Z6830 Body mass index (BMI) 30.0-30.9, adult: Secondary | ICD-10-CM

## 2019-01-28 NOTE — Progress Notes (Signed)
Office: [phone]  /  Fax: [phone]   HPI:   Chief Complaint: OBESITY Tamara Ball is here to discuss her progress with her obesity treatment plan. She is on the Category 4 plan and is following her eating plan approximately 80% of the time. She states she is exercising 0 minutes 0 times per week. Tamara Ball's previous IC was 2174; her IC today is 44. Her weight is 180 lb (81.6 kg) today and has had a weight loss of 4 pounds over a period of 3 weeks since her last visit. She has lost 25 lbs since starting treatment with [name].  Fatigue and Shortness of Breath Tamara Ball feels her energy is lower than it should be. She had IC today which showed RMR of 1977, down from 2174 previously.  Vitamin D deficiency Tamara Ball has a diagnosis of Vitamin D deficiency. She is currently taking OTC Vit D and denies nausea, vomiting or muscle weakness.  At risk for osteopenia and osteoporosis Tamara Ball is at higher risk of osteopenia and osteoporosis due to Vitamin D deficiency.   ASSESSMENT AND PLAN:  Other fatigue  Shortness of breath on exertion  Vitamin D deficiency  At risk for osteoporosis  Class 1 obesity with serious comorbidity and body mass index (BMI) of 30.0 to 30.9 in adult, unspecified obesity type - Starting BMI greater then 30  PLAN:  Fatigue and Shortness of Breath Tamara Ball was informed that her fatigue may be related to obesity, depression or many other causes. She will gradually increase exercise with cardio and resistance.  Vitamin D Deficiency Tamara Ball was informed that low Vitamin D levels contributes to fatigue and are associated with obesity, breast, and colon cancer. She agrees to continue taking OTC Vit D and will follow-up for routine testing of Vitamin D, at least 2-3 times per year. She was informed of the risk of over-replacement of Vitamin D and agrees to not increase her dose unless she discusses this with [name] first. Tamara Ball agrees to follow-up with our clinic in 2 weeks.  At  risk for osteopenia and osteoporosis Tamara Ball was given extended  (15 minutes) osteoporosis prevention counseling today. Tamara Ball is at risk for osteopenia and osteoporosis due to her Vitamin D deficiency. She was encouraged to take her Vitamin D and follow her higher calcium diet and increase strengthening exercise to help strengthen her bones and decrease her risk of osteopenia and osteoporosis.  Obesity Tamara Ball is currently in the action stage of change. As such, her goal is to continue with weight loss efforts. She has agreed to change plans and will follow a lower carbohydrate, vegetable and lean protein rich diet plan. Tamara Ball will work on meal planning, intentional eating, and will begin a low carb diet. Tamara Ball has been instructed to do cardio and resistance for weight loss and overall health benefits. We discussed the following Behavioral Modification Strategies today: increasing lean protein intake, decreasing simple carbohydrates, increasing vegetables, increase H20 intake, decrease eating out, no skipping meals, work on meal planning and easy cooking plans, and keeping healthy foods in the home.  Tamara Ball has agreed to follow-up with our clinic in 2 weeks. She was informed of the importance of frequent follow-up visits to maximize her success with intensive lifestyle modifications for her multiple health conditions.  ALLERGIES: No Known Allergies  MEDICATIONS: Current Outpatient Medications on File Prior to Visit  Medication Sig Dispense Refill  . AVIANE 0.1-20 MG-MCG tablet     . b complex vitamins tablet Take 1 tablet by mouth daily.    Marland Kitchen  Biotin 10000 MCG TABS Take 1 tablet by mouth daily.    Marland Kitchen DYMISTA 137-50 MCG/ACT SUSP 1 actuation each nostril twice daily 23 g 3  . hydrocortisone (ANUSOL-HC) 2.5 % rectal cream Place 1 application rectally 2 (two) times daily. 30 g 0  . Insulin Pen Needle (BD PEN NEEDLE NANO 2ND GEN) 32G X 4 MM MISC 1 Package by Does not apply route 2 (two) times  daily. 100 each 0  . levocetirizine (XYZAL) 5 MG tablet Take 1 tablet (5 mg total) by mouth every evening. 30 tablet 3  . Liraglutide -Weight Management (SAXENDA) 18 MG/3ML SOPN Inject 3 mg into the skin daily. 5 pen 0  . montelukast (SINGULAIR) 10 MG tablet Take 1 tablet (10 mg total) by mouth at bedtime. 30 tablet 3  . Multiple Vitamin (MULTIVITAMIN WITH MINERALS) TABS tablet Take 1 tablet by mouth daily.    . Na Sulfate-K Sulfate-Mg Sulf 17.5-3.13-1.6 GM/177ML SOLN Take 1 kit by mouth as directed. 354 mL 0  . Omega-3 Fatty Acids (FISH OIL) 1000 MG CAPS Take 1 capsule by mouth daily.    . ondansetron (ZOFRAN) 4 MG tablet Take 1 tablet (4 mg total) by mouth every 8 (eight) hours as needed for nausea or vomiting. 20 tablet 0  . polyethylene glycol (MIRALAX / GLYCOLAX) 17 g packet Take 17 g by mouth daily.     No current facility-administered medications on file prior to visit.     PAST MEDICAL HISTORY: Past Medical History:  Diagnosis Date  . Anemia   . Anxiety   . Chicken pox   . Chronic headaches   . Constipation   . Fibroids   . Iron deficiency anemia due to chronic blood loss    from heavy periods  . Menorrhagia   . Palpitations     PAST SURGICAL HISTORY: History reviewed. No pertinent surgical history.  SOCIAL HISTORY: Social History   Tobacco Use  . Smoking status: Never Smoker  . Smokeless tobacco: Never Used  Substance Use Topics  . Alcohol use: Yes    Alcohol/week: 0.0 standard drinks    Comment: social  . Drug use: No    FAMILY HISTORY: Family History  Problem Relation Age of Onset  . Hypertension Mother   . Cancer Mother   . Diabetes Maternal Grandfather   . Hypertension Maternal Uncle    ROS: Review of Systems  Constitutional: Positive for malaise/fatigue.  Respiratory: Positive for shortness of breath.   Gastrointestinal: Negative for nausea and vomiting.  Musculoskeletal:       Negative for muscle weakness.   PHYSICAL EXAM: Blood pressure  103/64, pulse 83, temperature 98.1 F (36.7 C), temperature source Oral, height '5\' 6"'  (1.676 m), weight 180 lb (81.6 kg), SpO2 100 %. Body mass index is 29.05 kg/m. Physical Exam Vitals signs reviewed.  Constitutional:      Appearance: Normal appearance. She is obese.  Cardiovascular:     Rate and Rhythm: Normal rate.     Pulses: Normal pulses.  Pulmonary:     Effort: Pulmonary effort is normal.     Breath sounds: Normal breath sounds.  Musculoskeletal: Normal range of motion.  Skin:    General: Skin is warm and dry.  Neurological:     Mental Status: She is alert and oriented to person, place, and time.  Psychiatric:        Behavior: Behavior normal.   RECENT LABS AND TESTS: BMET    Component Value Date/Time   NA 140 10/16/2016  1853   NA 136 08/11/2016 1404   K 3.9 10/16/2016 1853   K 4.2 08/11/2016 1404   CL 107 10/16/2016 1853   CL 103 08/11/2016 1404   CO2 25 10/16/2016 1853   CO2 26 08/11/2016 1404   GLUCOSE 103 (H) 10/16/2016 1853   BUN 15 10/16/2016 1853   BUN 13 08/11/2016 1404   CREATININE 0.85 10/16/2016 1853   CREATININE 0.87 08/11/2016 1404   CALCIUM 9.1 10/16/2016 1853   CALCIUM 9.5 08/11/2016 1404   GFRNONAA >60 10/16/2016 1853   GFRAA >60 10/16/2016 1853   Lab Results  Component Value Date   HGBA1C 5.0 05/27/2018   HGBA1C 5.1 05/22/2017   HGBA1C 5.1 05/16/2016   HGBA1C 5.0 04/07/2015   Lab Results  Component Value Date   INSULIN 8.9 06/13/2018   CBC    Component Value Date/Time   WBC 8.1 11/27/2018 1044   WBC 8.2 10/16/2016 1853   RBC 5.04 11/27/2018 1044   HGB 14.8 11/27/2018 1044   HGB 14.2 04/20/2017 1524   HCT 46.0 11/27/2018 1044   HCT 43.2 04/20/2017 1524   PLT 211 11/27/2018 1044   PLT 199 04/20/2017 1524   MCV 91.3 11/27/2018 1044   MCV 90 04/20/2017 1524   MCH 29.4 11/27/2018 1044   MCHC 32.2 11/27/2018 1044   RDW 12.8 11/27/2018 1044   RDW 13.5 04/20/2017 1524   LYMPHSABS 1.5 11/27/2018 1044   LYMPHSABS 1.6 04/20/2017  1524   MONOABS 0.4 11/27/2018 1044   EOSABS 0.4 11/27/2018 1044   EOSABS 0.4 04/20/2017 1524   BASOSABS 0.0 11/27/2018 1044   BASOSABS 0.1 04/20/2017 1524   Iron/TIBC/Ferritin/ %Sat    Component Value Date/Time   IRON 52 11/27/2018 1044   IRON 103 04/20/2017 1524   TIBC 448 (H) 11/27/2018 1044   TIBC 450 (H) 04/20/2017 1524   FERRITIN 43 11/27/2018 1044   FERRITIN 23 04/20/2017 1524   IRONPCTSAT 12 (L) 11/27/2018 1044   IRONPCTSAT 23 04/20/2017 1524   IRONPCTSAT 18 (L) 10/30/2014 1213   Lipid Panel     Component Value Date/Time   CHOL 119 05/27/2018 0848   TRIG 52.0 05/27/2018 0848   HDL 50.60 05/27/2018 0848   CHOLHDL 2 05/27/2018 0848   VLDL 10.4 05/27/2018 0848   LDLCALC 58 05/27/2018 0848   Hepatic Function Panel     Component Value Date/Time   PROT 7.3 08/11/2016 1404   ALBUMIN 4.2 08/11/2016 1404   AST 9 08/11/2016 1404   ALT 8 08/11/2016 1404   ALKPHOS 58 08/11/2016 1404   BILITOT 0.5 08/11/2016 1404   BILIDIR 0.0 06/24/2012 1055      Component Value Date/Time   TSH 2.250 06/13/2018 1123   TSH 1.28 01/26/2016 1358   TSH 2.46 04/28/2014 0957   Results for FARAH, LEPAK (MRN [id number]) as of 01/28/2019 12:43  Ref. Range 06/13/2018 11:23  Vitamin D, 25-Hydroxy Latest Ref Range: 30.0 - 100.0 ng/mL 40.4   OBESITY BEHAVIORAL INTERVENTION VISIT  Today's visit was #10  Starting weight: 205 lbs Starting date: 06/13/2018 Today's weight: 180 lbs Today's date: 01/27/2019 Total lbs lost to date: 25   01/27/2019  Height '5\' 6"'  (1.676 m)  Weight 180 lb (81.6 kg)  BMI (Calculated) 29.07  BLOOD PRESSURE - SYSTOLIC 791  BLOOD PRESSURE - DIASTOLIC 64   Body Fat % 50.5 %  Total Body Water (lbs) 75 lbs  RMR 1977   ASK: We discussed the diagnosis of obesity with Mayo Ao today  and Annice agreed to give [name] permission to discuss obesity behavioral modification therapy today.  ASSESS: Regla has the diagnosis of obesity and her BMI today is 29.2. [place] is  in the action stage of change.   ADVISE: Shakaya was educated on the multiple health risks of obesity as well as the benefit of weight loss to improve her health. She was advised of the need for long term treatment and the importance of lifestyle modifications to improve her current health and to decrease her risk of future health problems.  AGREE: Multiple dietary modification options and treatment options were discussed and  Michaila agreed to follow the recommendations documented in the above note.  ARRANGE: Meisha was educated on the importance of frequent visits to treat obesity as outlined per CMS and USPSTF guidelines and agreed to schedule her next follow up appointment today.  Migdalia Dk, am acting as Location manager for [employer], DO  I have reviewed the above documentation for accuracy and completeness, and I agree with the above. -Jearld Lesch, DO

## 2019-02-10 ENCOUNTER — Encounter (INDEPENDENT_AMBULATORY_CARE_PROVIDER_SITE_OTHER): Payer: Self-pay

## 2019-02-11 ENCOUNTER — Ambulatory Visit (INDEPENDENT_AMBULATORY_CARE_PROVIDER_SITE_OTHER): Payer: BC Managed Care – PPO | Admitting: Bariatrics

## 2019-02-14 ENCOUNTER — Encounter: Payer: Self-pay | Admitting: Family Medicine

## 2019-02-14 MED ORDER — MONTELUKAST SODIUM 10 MG PO TABS: 10.0000 mg | ORAL_TABLET | Freq: Every day | ORAL | 3 refills | Status: DC

## 2019-02-14 MED ORDER — LEVOCETIRIZINE DIHYDROCHLORIDE 5 MG PO TABS: 5.0000 mg | ORAL_TABLET | Freq: Every evening | ORAL | 3 refills | Status: DC

## 2019-02-18 ENCOUNTER — Encounter: Payer: Self-pay | Admitting: Gastroenterology

## 2019-02-19 ENCOUNTER — Ambulatory Visit (INDEPENDENT_AMBULATORY_CARE_PROVIDER_SITE_OTHER): Payer: BC Managed Care – PPO | Admitting: Bariatrics

## 2019-02-20 ENCOUNTER — Other Ambulatory Visit: Payer: Self-pay

## 2019-02-20 ENCOUNTER — Ambulatory Visit (INDEPENDENT_AMBULATORY_CARE_PROVIDER_SITE_OTHER): Payer: BC Managed Care – PPO | Admitting: Physician Assistant

## 2019-02-20 VITALS — BP 94/62 | HR 99 | Temp 98.0°F | Ht 66.0 in | Wt 179.0 lb

## 2019-02-20 DIAGNOSIS — E669 Obesity, unspecified: Secondary | ICD-10-CM | POA: Diagnosis not present

## 2019-02-20 DIAGNOSIS — Z6830 Body mass index (BMI) 30.0-30.9, adult: Secondary | ICD-10-CM | POA: Diagnosis not present

## 2019-02-20 DIAGNOSIS — E559 Vitamin D deficiency, unspecified: Secondary | ICD-10-CM

## 2019-02-20 DIAGNOSIS — Z9189 Other specified personal risk factors, not elsewhere classified: Secondary | ICD-10-CM

## 2019-02-20 MED ORDER — SAXENDA 18 MG/3ML ~~LOC~~ SOPN: 3.0000 mg | PEN_INJECTOR | Freq: Every day | SUBCUTANEOUS | 0 refills | Status: DC

## 2019-02-20 MED ORDER — BD PEN NEEDLE NANO 2ND GEN 32G X 4 MM MISC: 1.0000 | Freq: Two times a day (BID) | 0 refills | Status: DC

## 2019-02-24 ENCOUNTER — Telehealth: Payer: Self-pay

## 2019-02-24 NOTE — Telephone Encounter (Signed)
Covid-19 screening questions   Do you now or have you had a fever in the last 14 days?  Do you have any respiratory symptoms of shortness of breath or cough now or in the last 14 days?  Do you have any family members or close contacts with diagnosed or suspected Covid-19 in the past 14 days?  Have you been tested for Covid-19 and found to be positive?       

## 2019-02-24 NOTE — Telephone Encounter (Signed)
Pt responded "no" to all screening questions °

## 2019-02-25 ENCOUNTER — Encounter: Payer: Self-pay | Admitting: Family Medicine

## 2019-02-25 ENCOUNTER — Encounter: Payer: Self-pay | Admitting: Gastroenterology

## 2019-02-25 ENCOUNTER — Telehealth: Payer: Self-pay | Admitting: Gastroenterology

## 2019-02-25 ENCOUNTER — Ambulatory Visit (AMBULATORY_SURGERY_CENTER): Payer: BC Managed Care – PPO | Admitting: Gastroenterology

## 2019-02-25 ENCOUNTER — Other Ambulatory Visit: Payer: Self-pay

## 2019-02-25 VITALS — BP 121/68 | HR 79 | Temp 97.5°F | Resp 16 | Ht 66.0 in | Wt 183.0 lb

## 2019-02-25 DIAGNOSIS — D122 Benign neoplasm of ascending colon: Secondary | ICD-10-CM | POA: Diagnosis not present

## 2019-02-25 DIAGNOSIS — K5730 Diverticulosis of large intestine without perforation or abscess without bleeding: Secondary | ICD-10-CM | POA: Diagnosis not present

## 2019-02-25 DIAGNOSIS — K648 Other hemorrhoids: Secondary | ICD-10-CM

## 2019-02-25 DIAGNOSIS — K625 Hemorrhage of anus and rectum: Secondary | ICD-10-CM

## 2019-02-25 MED ORDER — FLEET ENEMA 7-19 GM/118ML RE ENEM
1.0000 | ENEMA | Freq: Once | RECTAL | Status: AC
  Administered 2019-02-25: 1 via RECTAL

## 2019-02-25 MED ORDER — SODIUM CHLORIDE 0.9 % IV SOLN: 500.0000 mL | Freq: Once | INTRAVENOUS | Status: DC

## 2019-02-25 NOTE — Progress Notes (Signed)
Medical history reviewed.  Patient states that her stool is brown and she can't see through it.  Dr. Tarri Glenn ordered an enema. Patient instructed not to flush the toilet after enema.  Patient's stool looked like brown sludge.  Can see through it, but not perfectly. CRNA aware.

## 2019-02-25 NOTE — Progress Notes (Signed)
Office: [phone]  /  Fax: [phone]   HPI:   Chief Complaint: OBESITY Tamara Ball is here to discuss her progress with her obesity treatment plan. She is on the "meat and vegetables" eating plan and is following her eating plan approximately 70% of the time. She states she is exercising 0 minutes 0 times per week. Tamara Ball states that she did not like low carb as she was not able to eat the snacks that she enjoys. She reports that she is going to have a colonoscopy next week due to constipation issues and blood in stools with hemorrhoids. Her weight is 179 lb (81.2 kg) today and has had a weight loss of 1 pound over a period of 3 weeks since her last visit. She has lost 26 lbs since starting treatment with [name].  Vitamin D deficiency Tamara Ball has a diagnosis of Vitamin D deficiency. She is currently taking multivitamin Vit D and denies nausea, vomiting or muscle weakness.  At risk for osteopenia and osteoporosis Tamara Ball is at higher risk of osteopenia and osteoporosis due to Vitamin D deficiency.   ASSESSMENT AND PLAN:  Vitamin D deficiency  At risk for osteoporosis  Class 1 obesity with serious comorbidity and body mass index (BMI) of 30.0 to 30.9 in adult, unspecified obesity type - BMI greater than 30 at start of program  - Plan: Insulin Pen Needle (BD PEN NEEDLE NANO 2ND GEN) 32G X 4 MM MISC, Liraglutide -Weight Management (SAXENDA) 18 MG/3ML SOPN  PLAN:  Vitamin D Deficiency Tamara Ball was informed that low Vitamin D levels contributes to fatigue and are associated with obesity, breast, and colon cancer. She agrees to continue taking multivitamin Vit D and will follow-up for routine testing of Vitamin D, at least 2-3 times per year. She was informed of the risk of over-replacement of Vitamin D and agrees to not increase her dose unless she discusses this with [name] first. Tamara Ball agrees to follow-up with our clinic in 2 weeks.  At risk for osteopenia and osteoporosis Tamara Ball was given  extended  (15 minutes) osteoporosis prevention counseling today. Tamara Ball is at risk for osteopenia and osteoporosis due to her Vitamin D deficiency. She was encouraged to take her Vitamin D and follow her higher calcium diet and increase strengthening exercise to help strengthen her bones and decrease her risk of osteopenia and osteoporosis.  Obesity Tamara Ball is currently in the action stage of change. As such, her goal is to continue with weight loss efforts. She has agreed to follow the Category 3 plan. Tamara Ball has been instructed to work up to a goal of 150 minutes of combined cardio and strengthening exercise per week for weight loss and overall health benefits. We discussed the following Behavioral Modification Strategies today: work on meal planning and easy cooking plans, and keeping healthy foods in the home.  Tamara Ball was given a refill on her Saxenda #5 pens and #100 needles. She agrees to follow-up with our clinic in 2 weeks.  Tamara Ball has agreed to follow-up with our clinic in 2 weeks. She was informed of the importance of frequent follow-up visits to maximize her success with intensive lifestyle modifications for her multiple health conditions. Ball spent > than 50% of the 25 minute visit on counseling as documented in the note.    ALLERGIES: No Known Allergies  MEDICATIONS: Current Outpatient Medications on File Prior to Visit  Medication Sig Dispense Refill   AVIANE 0.1-20 MG-MCG tablet      b complex vitamins tablet Take 1 tablet  by mouth daily.     Biotin 10000 MCG TABS Take 1 tablet by mouth daily.     DYMISTA 137-50 MCG/ACT SUSP 1 actuation each nostril twice daily 23 g 3   hydrocortisone (ANUSOL-HC) 2.5 % rectal cream Place 1 application rectally 2 (two) times daily. 30 g 0   levocetirizine (XYZAL) 5 MG tablet Take 1 tablet (5 mg total) by mouth every evening. 30 tablet 3   montelukast (SINGULAIR) 10 MG tablet Take 1 tablet (10 mg total) by mouth at bedtime. 30 tablet 3     Multiple Vitamin (MULTIVITAMIN WITH MINERALS) TABS tablet Take 1 tablet by mouth daily.     Omega-3 Fatty Acids (FISH OIL) 1000 MG CAPS Take 1 capsule by mouth daily.     ondansetron (ZOFRAN) 4 MG tablet Take 1 tablet (4 mg total) by mouth every 8 (eight) hours as needed for nausea or vomiting. 20 tablet 0   polyethylene glycol (MIRALAX / GLYCOLAX) 17 g packet Take 17 g by mouth daily.     No current facility-administered medications on file prior to visit.     PAST MEDICAL HISTORY: Past Medical History:  Diagnosis Date   Anemia    Anxiety    Chicken pox    Chronic headaches    Constipation    Fibroids    Iron deficiency anemia due to chronic blood loss    from heavy periods   Menorrhagia    Palpitations     PAST SURGICAL HISTORY: No past surgical history on file.  SOCIAL HISTORY: Social History   Tobacco Use   Smoking status: Never Smoker   Smokeless tobacco: Never Used  Substance Use Topics   Alcohol use: Yes    Alcohol/week: 0.0 standard drinks    Comment: social   Drug use: No    FAMILY HISTORY: Family History  Problem Relation Age of Onset   Hypertension Mother    Cancer Mother    Diabetes Maternal Grandfather    Hypertension Maternal Uncle    ROS: Review of Systems  Gastrointestinal: Negative for nausea and vomiting.  Musculoskeletal:       Negative for muscle weakness.   PHYSICAL EXAM: Blood pressure 94/62, pulse 99, temperature 98 F (36.7 C), temperature source Oral, height 5\' 6"  (1.676 m), weight 179 lb (81.2 kg), SpO2 100 %. Body mass index is 28.89 kg/m. Physical Exam Vitals signs reviewed.  Constitutional:      Appearance: Normal appearance. She is obese.  Cardiovascular:     Rate and Rhythm: Normal rate.     Pulses: Normal pulses.  Pulmonary:     Effort: Pulmonary effort is normal.     Breath sounds: Normal breath sounds.  Musculoskeletal: Normal range of motion.  Skin:    General: Skin is warm and dry.   Neurological:     Mental Status: She is alert and oriented to person, place, and time.  Psychiatric:        Behavior: Behavior normal.   RECENT LABS AND TESTS: BMET    Component Value Date/Time   NA 140 10/16/2016 1853   NA 136 08/11/2016 1404   Tamara 3.9 10/16/2016 1853   Tamara 4.2 08/11/2016 1404   CL 107 10/16/2016 1853   CL 103 08/11/2016 1404   CO2 25 10/16/2016 1853   CO2 26 08/11/2016 1404   GLUCOSE 103 (H) 10/16/2016 1853   BUN 15 10/16/2016 1853   BUN 13 08/11/2016 1404   CREATININE 0.85 10/16/2016 1853   CREATININE 0.87 08/11/2016  1404   CALCIUM 9.1 10/16/2016 1853   CALCIUM 9.5 08/11/2016 1404   GFRNONAA >60 10/16/2016 1853   GFRAA >60 10/16/2016 1853   Lab Results  Component Value Date   HGBA1C 5.0 05/27/2018   HGBA1C 5.1 05/22/2017   HGBA1C 5.1 05/16/2016   HGBA1C 5.0 04/07/2015   Lab Results  Component Value Date   INSULIN 8.9 06/13/2018   CBC    Component Value Date/Time   WBC 8.1 11/27/2018 1044   WBC 8.2 10/16/2016 1853   RBC 5.04 11/27/2018 1044   HGB 14.8 11/27/2018 1044   HGB 14.2 04/20/2017 1524   HCT 46.0 11/27/2018 1044   HCT 43.2 04/20/2017 1524   PLT 211 11/27/2018 1044   PLT 199 04/20/2017 1524   MCV 91.3 11/27/2018 1044   MCV 90 04/20/2017 1524   MCH 29.4 11/27/2018 1044   MCHC 32.2 11/27/2018 1044   RDW 12.8 11/27/2018 1044   RDW 13.5 04/20/2017 1524   LYMPHSABS 1.5 11/27/2018 1044   LYMPHSABS 1.6 04/20/2017 1524   MONOABS 0.4 11/27/2018 1044   EOSABS 0.4 11/27/2018 1044   EOSABS 0.4 04/20/2017 1524   BASOSABS 0.0 11/27/2018 1044   BASOSABS 0.1 04/20/2017 1524   Iron/TIBC/Ferritin/ %Sat    Component Value Date/Time   IRON 52 11/27/2018 1044   IRON 103 04/20/2017 1524   TIBC 448 (H) 11/27/2018 1044   TIBC 450 (H) 04/20/2017 1524   FERRITIN 43 11/27/2018 1044   FERRITIN 23 04/20/2017 1524   IRONPCTSAT 12 (L) 11/27/2018 1044   IRONPCTSAT 23 04/20/2017 1524   IRONPCTSAT 18 (L) 10/30/2014 1213   Lipid Panel     Component  Value Date/Time   CHOL 119 05/27/2018 0848   TRIG 52.0 05/27/2018 0848   HDL 50.60 05/27/2018 0848   CHOLHDL 2 05/27/2018 0848   VLDL 10.4 05/27/2018 0848   LDLCALC 58 05/27/2018 0848   Hepatic Function Panel     Component Value Date/Time   PROT 7.3 08/11/2016 1404   ALBUMIN 4.2 08/11/2016 1404   AST 9 08/11/2016 1404   ALT 8 08/11/2016 1404   ALKPHOS 58 08/11/2016 1404   BILITOT 0.5 08/11/2016 1404   BILIDIR 0.0 06/24/2012 1055      Component Value Date/Time   TSH 2.250 06/13/2018 1123   TSH 1.28 01/26/2016 1358   TSH 2.46 04/28/2014 0957   Results for SHAM, CORAL (MRN [id number]) as of 02/25/2019 10:36  Ref. Range 06/13/2018 11:23  Vitamin D, 25-Hydroxy Latest Ref Range: 30.0 - 100.0 ng/mL 40.4   OBESITY BEHAVIORAL INTERVENTION VISIT  Today's visit was #11  Starting weight: 205 lbs Starting date: 06/13/2018 Today's weight: 179 lbs  Today's date: 02/20/2019 Total lbs lost to date: 26    02/20/2019  Height 5\' 6"  (1.676 m)  Weight 179 lb (81.2 kg)  BMI (Calculated) 28.91  BLOOD PRESSURE - SYSTOLIC 94  BLOOD PRESSURE - DIASTOLIC 62   Body Fat % 123XX123 %  Total Body Water (lbs) 75 lbs   ASK: We discussed the diagnosis of obesity with Tamara Ball today and Tamara Ball agreed to give [name] permission to discuss obesity behavioral modification therapy today.  ASSESS: Tamara Ball has the diagnosis of obesity and her BMI today is 28.9. Tamara Ball is in the action stage of change.   ADVISE: Tamara Ball was educated on the multiple health risks of obesity as well as the benefit of weight loss to improve her health. She was advised of the need for long term treatment and the importance  of lifestyle modifications to improve her current health and to decrease her risk of future health problems.  AGREE: Multiple dietary modification options and treatment options were discussed and  Tamara Ball agreed to follow the recommendations documented in the above note.  ARRANGE: Tamara Ball was educated on  the importance of frequent visits to treat obesity as outlined per CMS and USPSTF guidelines and agreed to schedule her next follow up appointment today.  IMichaelene Song, am acting as Location manager for [employer], PA-C

## 2019-02-25 NOTE — Op Note (Signed)
[facility] Patient Name: Tamara Ball Procedure Date: 02/25/2019 1:58 PM MRN: [id number] Endoscopist: Thornton Park MD, MD Age: 38 Referring MD:  Date of Birth: 12/28/1980 Gender: Female Account #: [id number] Procedure:                Colonoscopy Indications:              Rectal bleeding, Rectal pain Medicines:                See the Anesthesia note for documentation of the                            administered medications Procedure:                Pre-Anesthesia Assessment:                           - Prior to the procedure, a History and Physical                            was performed, and patient medications and                            allergies were reviewed. The patient's tolerance of                            previous anesthesia was also reviewed. The risks                            and benefits of the procedure and the sedation                            options and risks were discussed with the patient.                            All questions were answered, and informed consent                            was obtained. Prior Anticoagulants: The patient has                            taken no previous anticoagulant or antiplatelet                            agents. ASA Grade Assessment: II - A patient with                            mild systemic disease. After reviewing the risks                            and benefits, the patient was deemed in                            satisfactory condition to undergo the procedure.  After obtaining informed consent, the colonoscope                            was passed under direct vision. Throughout the                            procedure, the patient's blood pressure, pulse, and                            oxygen saturations were monitored continuously. The                            Colonoscope was introduced through the anus and                            advanced to the the terminal  ileum, with                            identification of the appendiceal orifice and IC                            valve. The colonoscopy was performed with moderate                            difficulty due to a redundant colon, significant                            looping and a tortuous colon. Successful completion                            of the procedure was aided by applying abdominal                            pressure. The patient tolerated the procedure well.                            The quality of the bowel preparation was good. The                            terminal ileum, ileocecal valve, appendiceal                            orifice, and rectum were photographed. Scope In: 2:04:14 PM Scope Out: 2:19:21 PM Scope Withdrawal Time: 0 hours 11 minutes 53 seconds  Total Procedure Duration: 0 hours 15 minutes 7 seconds  Findings:                 A few small-mouthed diverticula were found in the                            sigmoid colon and ascending colon.                           A 1 mm polyp was found in the  proximal ascending                            colon. The polyp was sessile. The polyp was removed                            with a piecemeal technique using a cold biopsy                            forceps. Resection and retrieval were complete.                            Estimated blood loss was minimal.                           Non-bleeding small internal hemorrhoids were found.                           The exam was otherwise without abnormality on                            direct and retroflexion views. No fissure seen on                            anal exam. Complications:            No immediate complications. Estimated blood loss:                            Minimal. Estimated Blood Loss:     Estimated blood loss was minimal. Impression:               - Diverticulosis in the sigmoid colon and in the                            ascending colon.                            - One 1 mm polyp in the proximal ascending colon,                            removed piecemeal using a cold biopsy forceps.                            Resected and retrieved.                           - Non-bleeding external and internal hemorrhoids.                           - The examination was otherwise normal on direct                            and retroflexion views. Recommendation:           - Patient has a contact number available for  emergencies. The signs and symptoms of potential                            delayed complications were discussed with the                            patient. Return to normal activities tomorrow.                            Written discharge instructions were provided to the                            patient.                           - Resume previous diet today. Follow a high fiber                            diet.                           - Continue to drink at least 64 ounces of water                            daily.                           - Continue present medications including Miralax 17                            grams daily.                           - Await pathology results.                           - Repeat colonoscopy date to be determined after                            pending pathology results are reviewed for                            surveillance based on pathology results. Thornton Park MD, MD 02/25/2019 2:25:13 PM This report has been signed electronically.

## 2019-02-25 NOTE — Progress Notes (Signed)
To PACU, VSS. Report to [email or web address] 

## 2019-02-25 NOTE — Patient Instructions (Signed)
Handouts given for polyps, diverticulosis, hemorrhoids and high fiber diet.    Drink at least 64 oz water daily and continue Miralax 17 gm daily.  YOU HAD AN ENDOSCOPIC PROCEDURE TODAY AT [facility]:   Refer to the procedure report that was given to you for any specific questions about what was found during the examination.  If the procedure report does not answer your questions, please call your gastroenterologist to clarify.  If you requested that your care partner not be given the details of your procedure findings, then the procedure report has been included in a sealed envelope for you to review at your convenience later.  YOU SHOULD EXPECT: Some feelings of bloating in the abdomen. Passage of more gas than usual.  Walking can help get rid of the air that was put into your GI tract during the procedure and reduce the bloating. If you had a lower endoscopy (such as a colonoscopy or flexible sigmoidoscopy) you may notice spotting of blood in your stool or on the toilet paper. If you underwent a bowel prep for your procedure, you may not have a normal bowel movement for a few days.  Please Note:  You might notice some irritation and congestion in your nose or some drainage.  This is from the oxygen used during your procedure.  There is no need for concern and it should clear up in a day or so.  SYMPTOMS TO REPORT IMMEDIATELY:   Following lower endoscopy (colonoscopy or flexible sigmoidoscopy):  Excessive amounts of blood in the stool  Significant tenderness or worsening of abdominal pains  Swelling of the abdomen that is new, acute  Fever of 100F or higher  For urgent or emergent issues, a gastroenterologist can be reached at any hour by calling [phone].   DIET:  We do recommend a small meal at first, but then you may proceed to your regular diet.  Drink plenty of fluids but you should avoid alcoholic beverages for 24 hours.  ACTIVITY:  You should plan to take it  easy for the rest of today and you should NOT DRIVE or use heavy machinery until tomorrow (because of the sedation medicines used during the test).    FOLLOW UP: Our staff will call the number listed on your records 48-72 hours following your procedure to check on you and address any questions or concerns that you may have regarding the information given to you following your procedure. If we do not reach you, we will leave a message.  We will attempt to reach you two times.  During this call, we will ask if you have developed any symptoms of COVID 19. If you develop any symptoms (ie: fever, flu-like symptoms, shortness of breath, cough etc.) before then, please call [phone].  If you test positive for Covid 19 in the 2 weeks post procedure, please call and report this information to [name].    If any biopsies were taken you will be contacted by phone or by letter within the next 1-3 weeks.  Please call us at [phone] if you have not heard about the biopsies in 3 weeks.    SIGNATURES/CONFIDENTIALITY: You and/or your care partner have signed paperwork which will be entered into your electronic medical record.  These signatures attest to the fact that that the information above on your After Visit Summary has been reviewed and is understood.  Full responsibility of the confidentiality of this discharge information lies with you and/or your care-partner.

## 2019-02-25 NOTE — Telephone Encounter (Signed)
Pt reported that she has vomited her prep.  Please advise.  She is scheduled for a colon today at 2:00 PM.

## 2019-02-25 NOTE — Telephone Encounter (Signed)
Returned pts call. She states that she was able to keep down prep last night but vomited the 2nd half this morning.  She was able to keep it down for about 20 mins.  She states that her stools are all liquid and says "it looks like i'm peeing".  I explained that she is most likely cleaned out enough for procedure but to try to sip some clear liquids until 12:00.  She verbalizes understanding.

## 2019-02-27 ENCOUNTER — Encounter: Payer: Self-pay | Admitting: Family Medicine

## 2019-02-27 ENCOUNTER — Telehealth: Payer: Self-pay

## 2019-02-27 NOTE — Telephone Encounter (Signed)
  Follow up Call-  Call back number 02/25/2019  Post procedure Call Back phone  # [phone]  Permission to leave phone message Yes  Some recent data might be hidden     Patient questions:  Do you have a fever, pain , or abdominal swelling? No. Pain Score  0 *  Have you tolerated food without any problems? Yes.    Have you been able to return to your normal activities? Yes.    Do you have any questions about your discharge instructions: Diet   No. Medications  No. Follow up visit  No.  Do you have questions or concerns about your Care? No.  Actions: * If pain score is 4 or above: No action needed, pain <4. 1. Have you developed a fever since your procedure? no  2.   Have you had an respiratory symptoms (SOB or cough) since your procedure? no  3.   Have you tested positive for COVID 19 since your procedure no  4.   Have you had any family members/close contacts diagnosed with the COVID 19 since your procedure?  no   If yes to any of these questions please route to Joylene John, RN and Alphonsa Gin, Therapist, sports.

## 2019-02-28 ENCOUNTER — Encounter: Payer: Self-pay | Admitting: Gastroenterology

## 2019-03-10 ENCOUNTER — Encounter (INDEPENDENT_AMBULATORY_CARE_PROVIDER_SITE_OTHER): Payer: Self-pay

## 2019-03-11 ENCOUNTER — Other Ambulatory Visit: Payer: Self-pay

## 2019-03-11 ENCOUNTER — Encounter (INDEPENDENT_AMBULATORY_CARE_PROVIDER_SITE_OTHER): Payer: Self-pay | Admitting: Physician Assistant

## 2019-03-11 ENCOUNTER — Ambulatory Visit (INDEPENDENT_AMBULATORY_CARE_PROVIDER_SITE_OTHER): Payer: BC Managed Care – PPO | Admitting: Physician Assistant

## 2019-03-11 VITALS — BP 107/57 | HR 71 | Temp 97.7°F | Ht 66.0 in | Wt 181.0 lb

## 2019-03-11 DIAGNOSIS — Z6830 Body mass index (BMI) 30.0-30.9, adult: Secondary | ICD-10-CM | POA: Diagnosis not present

## 2019-03-11 DIAGNOSIS — E669 Obesity, unspecified: Secondary | ICD-10-CM

## 2019-03-11 DIAGNOSIS — E559 Vitamin D deficiency, unspecified: Secondary | ICD-10-CM

## 2019-03-11 DIAGNOSIS — E66811 Obesity, class 1: Secondary | ICD-10-CM

## 2019-03-12 NOTE — Progress Notes (Signed)
Office: [phone]  /  Fax: [phone]   HPI:   Chief Complaint: OBESITY Tamara Ball is here to discuss her progress with her obesity treatment plan. She is on the  follow the Category 3 plan and is following her eating plan approximately 60 % of the time. She states she is exercising 0 minutes 0 times per week. Tamara Ball reports that she has been eating out more often. 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 Her weight is 181 lb (82.1 kg) today and has had a weight gain of 2 pounds over a period of 2 to 3 weeks since her last visit. She has lost 24 lbs since starting treatment with [name].  Vitamin D deficiency Tamara Ball has a diagnosis of vitamin D deficiency. She is on OTC multi-vitamin with vitamin D. Tamara Ball denies nausea, vomiting or muscle weakness.  ASSESSMENT AND PLAN:  Vitamin D deficiency  Class 1 obesity with serious comorbidity and body mass index (BMI) of 30.0 to 30.9 in adult, unspecified obesity type - BMI greater than 30 at start of program   PLAN:  Vitamin D Deficiency Tamara Ball was informed that low vitamin D levels contributes to fatigue and are associated with obesity, breast, and colon cancer. Tamara Ball will continue with OTC multi-vitamin with vitamin D and she will follow up for routine testing of vitamin D, at least 2-3 times per year. She was informed of the risk of over-replacement of vitamin D and agrees to not increase her dose unless she discusses this with [name] first.  I spent > than 50% of the 15 minute visit on counseling as documented in the note.  Obesity Tamara Ball is currently in the action stage of change. As such, her goal is to continue with weight loss efforts She has agreed to keep a food journal with 450 to 600 calories and 40 grams of protein at supper daily and follow the Category 3 plan Tamara Ball has been instructed to work up to a goal of 150 minutes of combined cardio and strengthening exercise  per week for weight loss and overall health benefits. We discussed the following Behavioral Modification Strategies today: decrease eating out   Tamara Ball has agreed to follow up with our clinic in 2 weeks. She was informed of the importance of frequent follow up visits to maximize her success with intensive lifestyle modifications for her multiple health conditions.  ALLERGIES: No Known Allergies  MEDICATIONS: Current Outpatient Medications on File Prior to Visit  Medication Sig Dispense Refill   AVIANE 0.1-20 MG-MCG tablet      b complex vitamins tablet Take 1 tablet by mouth daily.     Biotin 10000 MCG TABS Take 1 tablet by mouth daily.     DYMISTA 137-50 MCG/ACT SUSP 1 actuation each nostril twice daily 23 g 3   hydrocortisone (ANUSOL-HC) 2.5 % rectal cream Place 1 application rectally 2 (two) times daily. 30 g 0   Insulin Pen Needle (BD PEN NEEDLE NANO 2ND GEN) 32G X 4 MM MISC 1 Package by Does not apply route 2 (two) times daily. 100 each 0   levocetirizine (XYZAL) 5 MG tablet Take 1 tablet (5 mg total) by mouth every evening. 30 tablet 3   Liraglutide -Weight Management (SAXENDA) 18 MG/3ML SOPN Inject 3 mg into the skin daily. 5 pen 0   montelukast (SINGULAIR) 10 MG tablet Take 1 tablet (10 mg total) by mouth at bedtime. 30 tablet 3  Multiple Vitamin (MULTIVITAMIN WITH MINERALS) TABS tablet Take 1 tablet by mouth daily.     Omega-3 Fatty Acids (FISH OIL) 1000 MG CAPS Take 1 capsule by mouth daily.     ondansetron (ZOFRAN) 4 MG tablet Take 1 tablet (4 mg total) by mouth every 8 (eight) hours as needed for nausea or vomiting. 20 tablet 0   polyethylene glycol (MIRALAX / GLYCOLAX) 17 g packet Take 17 g by mouth daily.     No current facility-administered medications on file prior to visit.     PAST MEDICAL HISTORY: Past Medical History:  Diagnosis Date   Anemia    Anxiety    Chicken pox    Chronic headaches    Constipation    Fibroids    Iron deficiency  anemia due to chronic blood loss    from heavy periods   Menorrhagia    Palpitations     PAST SURGICAL HISTORY: History reviewed. No pertinent surgical history.  SOCIAL HISTORY: Social History   Tobacco Use   Smoking status: Never Smoker   Smokeless tobacco: Never Used  Substance Use Topics   Alcohol use: Yes    Alcohol/week: 0.0 standard drinks    Comment: social   Drug use: No    FAMILY HISTORY: Family History  Problem Relation Age of Onset   Hypertension Mother    Cancer Mother    Diabetes Maternal Grandfather    Hypertension Maternal Uncle    Colon polyps Neg Hx    Colon cancer Neg Hx    Esophageal cancer Neg Hx     ROS: Review of Systems  Constitutional: Negative for weight loss.  Gastrointestinal: Negative for nausea and vomiting.  Musculoskeletal:       Negative for muscle weakness    PHYSICAL EXAM: Blood pressure (!) 107/57, pulse 71, temperature 97.7 F (36.5 C), temperature source Oral, height 5\' 6"  (1.676 m), weight 181 lb (82.1 kg), SpO2 100 %. Body mass index is 29.21 kg/m. Physical Exam Vitals signs reviewed.  Constitutional:      Appearance: Normal appearance. She is well-developed. She is obese.  Cardiovascular:     Rate and Rhythm: Normal rate.  Pulmonary:     Effort: Pulmonary effort is normal.  Musculoskeletal: Normal range of motion.  Skin:    General: Skin is warm and dry.  Neurological:     Mental Status: She is alert and oriented to person, place, and time.  Psychiatric:        Mood and Affect: Mood normal.        Behavior: Behavior normal.     RECENT LABS AND TESTS: BMET    Component Value Date/Time   NA 140 10/16/2016 1853   NA 136 08/11/2016 1404   K 3.9 10/16/2016 1853   K 4.2 08/11/2016 1404   CL 107 10/16/2016 1853   CL 103 08/11/2016 1404   CO2 25 10/16/2016 1853   CO2 26 08/11/2016 1404   GLUCOSE 103 (H) 10/16/2016 1853   BUN 15 10/16/2016 1853   BUN 13 08/11/2016 1404   CREATININE 0.85  10/16/2016 1853   CREATININE 0.87 08/11/2016 1404   CALCIUM 9.1 10/16/2016 1853   CALCIUM 9.5 08/11/2016 1404   GFRNONAA >60 10/16/2016 1853   GFRAA >60 10/16/2016 1853   Lab Results  Component Value Date   HGBA1C 5.0 05/27/2018   HGBA1C 5.1 05/22/2017   HGBA1C 5.1 05/16/2016   HGBA1C 5.0 04/07/2015   Lab Results  Component Value Date   INSULIN 8.9  06/13/2018   CBC    Component Value Date/Time   WBC 8.1 11/27/2018 1044   WBC 8.2 10/16/2016 1853   RBC 5.04 11/27/2018 1044   HGB 14.8 11/27/2018 1044   HGB 14.2 04/20/2017 1524   HCT 46.0 11/27/2018 1044   HCT 43.2 04/20/2017 1524   PLT 211 11/27/2018 1044   PLT 199 04/20/2017 1524   MCV 91.3 11/27/2018 1044   MCV 90 04/20/2017 1524   MCH 29.4 11/27/2018 1044   MCHC 32.2 11/27/2018 1044   RDW 12.8 11/27/2018 1044   RDW 13.5 04/20/2017 1524   LYMPHSABS 1.5 11/27/2018 1044   LYMPHSABS 1.6 04/20/2017 1524   MONOABS 0.4 11/27/2018 1044   EOSABS 0.4 11/27/2018 1044   EOSABS 0.4 04/20/2017 1524   BASOSABS 0.0 11/27/2018 1044   BASOSABS 0.1 04/20/2017 1524   Iron/TIBC/Ferritin/ %Sat    Component Value Date/Time   IRON 52 11/27/2018 1044   IRON 103 04/20/2017 1524   TIBC 448 (H) 11/27/2018 1044   TIBC 450 (H) 04/20/2017 1524   FERRITIN 43 11/27/2018 1044   FERRITIN 23 04/20/2017 1524   IRONPCTSAT 12 (L) 11/27/2018 1044   IRONPCTSAT 23 04/20/2017 1524   IRONPCTSAT 18 (L) 10/30/2014 1213   Lipid Panel     Component Value Date/Time   CHOL 119 05/27/2018 0848   TRIG 52.0 05/27/2018 0848   HDL 50.60 05/27/2018 0848   CHOLHDL 2 05/27/2018 0848   VLDL 10.4 05/27/2018 0848   LDLCALC 58 05/27/2018 0848   Hepatic Function Panel     Component Value Date/Time   PROT 7.3 08/11/2016 1404   ALBUMIN 4.2 08/11/2016 1404   AST 9 08/11/2016 1404   ALT 8 08/11/2016 1404   ALKPHOS 58 08/11/2016 1404   BILITOT 0.5 08/11/2016 1404   BILIDIR 0.0 06/24/2012 1055      Component Value Date/Time   TSH 2.250 06/13/2018 1123    TSH 1.28 01/26/2016 1358   TSH 2.46 04/28/2014 0957     Ref. Range 06/13/2018 11:23  Vitamin D, 25-Hydroxy Latest Ref Range: 30.0 - 100.0 ng/mL 40.4    OBESITY BEHAVIORAL INTERVENTION VISIT  Today's visit was # 12   Starting weight: 205 lbs Starting date: 06/13/2018 Today's weight :  181 lbs Today's date: 03/11/2019 Total lbs lost to date: 24    03/11/2019  Height 5\' 6"  (1.676 m)  Weight 181 lb (82.1 kg)  BMI (Calculated) 29.23  BLOOD PRESSURE - SYSTOLIC [id number]  BLOOD PRESSURE - DIASTOLIC 57   Body Fat % 0000000 %  Total Body Water (lbs) 78.4 lbs    ASK: We discussed the diagnosis of obesity with Mayo Ao today and Tamara Ball agreed to give [name] permission to discuss obesity behavioral modification therapy today.  ASSESS: Tamara Ball has the diagnosis of obesity and her BMI today is 29.23 Tamara Ball is in the action stage of change   ADVISE: Tamara Ball was educated on the multiple health risks of obesity as well as the benefit of weight loss to improve her health. She was advised of the need for long term treatment and the importance of lifestyle modifications to improve her current health and to decrease her risk of future health problems.  AGREE: Multiple dietary modification options and treatment options were discussed and  Tamara Ball agreed to follow the recommendations documented in the above note.  ARRANGE: Tamara Ball was educated on the importance of frequent visits to treat obesity as outlined per CMS and USPSTF guidelines and agreed to schedule her next follow up  appointment today.  Corey Skains, am acting as transcriptionist for Abby Potash, PA-C I, Abby Potash, PA-C have reviewed above note and agree with its content

## 2019-03-13 ENCOUNTER — Other Ambulatory Visit: Payer: Self-pay

## 2019-03-13 DIAGNOSIS — Z20822 Contact with and (suspected) exposure to covid-19: Secondary | ICD-10-CM

## 2019-03-15 LAB — NOVEL CORONAVIRUS, NAA: SARS-CoV-2, NAA: NOT DETECTED

## 2019-03-26 ENCOUNTER — Encounter (INDEPENDENT_AMBULATORY_CARE_PROVIDER_SITE_OTHER): Payer: Self-pay | Admitting: Physician Assistant

## 2019-03-26 NOTE — Telephone Encounter (Signed)
I would have her change to a [facility] visit. Thanks

## 2019-03-26 NOTE — Telephone Encounter (Signed)
Please advise 

## 2019-03-27 ENCOUNTER — Other Ambulatory Visit: Payer: Self-pay

## 2019-03-27 ENCOUNTER — Ambulatory Visit (INDEPENDENT_AMBULATORY_CARE_PROVIDER_SITE_OTHER): Payer: BC Managed Care – PPO | Admitting: Physician Assistant

## 2019-03-27 ENCOUNTER — Encounter (INDEPENDENT_AMBULATORY_CARE_PROVIDER_SITE_OTHER): Payer: Self-pay | Admitting: Physician Assistant

## 2019-03-27 DIAGNOSIS — E559 Vitamin D deficiency, unspecified: Secondary | ICD-10-CM | POA: Diagnosis not present

## 2019-03-27 DIAGNOSIS — E669 Obesity, unspecified: Secondary | ICD-10-CM

## 2019-03-27 DIAGNOSIS — Z6830 Body mass index (BMI) 30.0-30.9, adult: Secondary | ICD-10-CM

## 2019-03-27 DIAGNOSIS — E66811 Obesity, class 1: Secondary | ICD-10-CM

## 2019-04-01 ENCOUNTER — Other Ambulatory Visit: Payer: Self-pay

## 2019-04-01 DIAGNOSIS — Z20822 Contact with and (suspected) exposure to covid-19: Secondary | ICD-10-CM

## 2019-04-01 NOTE — Progress Notes (Signed)
Office: [phone]  /  Fax: [phone] TeleHealth Visit:  Mayo Ao has verbally consented to this TeleHealth visit today. The patient is located at work, the provider is located at the [employer] [facility] office. The participants in this visit include the listed provider and patient and any and all parties involved. The visit was conducted today via FaceTime.  HPI:   Chief Complaint: OBESITY Zoeie is here to discuss her progress with her obesity treatment plan. She is on the Category 3 plan and is following her eating plan approximately 75 % of the time. She states she is exercising 0 minutes 0 times per week. Louvella reports a decrease in hunger and "feeling of fullness", since changing her Saxenda from evenings to mornings. She is not able to eat all of the food on the plan. We were unable to weigh the patient today for this TeleHealth visit. She feels as if she has lost weight since her last visit (weight not reported). She has lost 24 lbs since starting treatment with [name].  Vitamin D deficiency Serafina has a diagnosis of vitamin D deficiency. Su is currently taking a multi-vitamin and she denies nausea, vomiting or muscle weakness.  ASSESSMENT AND PLAN:  Vitamin D deficiency  Class 1 obesity with serious comorbidity and body mass index (BMI) of 30.0 to 30.9 in adult, unspecified obesity type  PLAN:  Vitamin D Deficiency Refugio was informed that low vitamin D levels contributes to fatigue and are associated with obesity, breast, and colon cancer. Sudie will continue with multi-vitamin and she will follow up for routine testing of vitamin D, at least 2-3 times per year. She was informed of the risk of over-replacement of vitamin D and agrees to not increase her dose unless she discusses this with [name] first.  Obesity Afi is currently in the action stage of change. As such, her goal is to continue with weight loss efforts She has agreed to follow the Category 3  plan Lorrayne has been instructed to work up to a goal of 150 minutes of combined cardio and strengthening exercise per week for weight loss and overall health benefits. We discussed the following Behavioral Modification Strategies today: no skipping meals and holiday eating strategies   Shonta agreed to continue Saxenda 3 mg #5 pens with no refills (patient to decrease to 2.4 mg).  Lyris has agreed to follow up with our clinic in 3 weeks. She was informed of the importance of frequent follow up visits to maximize her success with intensive lifestyle modifications for her multiple health conditions.  ALLERGIES: No Known Allergies  MEDICATIONS: Current Outpatient Medications on File Prior to Visit  Medication Sig Dispense Refill   AVIANE 0.1-20 MG-MCG tablet      b complex vitamins tablet Take 1 tablet by mouth daily.     Biotin 10000 MCG TABS Take 1 tablet by mouth daily.     DYMISTA 137-50 MCG/ACT SUSP 1 actuation each nostril twice daily 23 g 3   hydrocortisone (ANUSOL-HC) 2.5 % rectal cream Place 1 application rectally 2 (two) times daily. 30 g 0   Insulin Pen Needle (BD PEN NEEDLE NANO 2ND GEN) 32G X 4 MM MISC 1 Package by Does not apply route 2 (two) times daily. 100 each 0   levocetirizine (XYZAL) 5 MG tablet Take 1 tablet (5 mg total) by mouth every evening. 30 tablet 3   Liraglutide -Weight Management (SAXENDA) 18 MG/3ML SOPN Inject 3 mg into the skin daily. 5 pen 0  montelukast (SINGULAIR) 10 MG tablet Take 1 tablet (10 mg total) by mouth at bedtime. 30 tablet 3   Multiple Vitamin (MULTIVITAMIN WITH MINERALS) TABS tablet Take 1 tablet by mouth daily.     Omega-3 Fatty Acids (FISH OIL) 1000 MG CAPS Take 1 capsule by mouth daily.     ondansetron (ZOFRAN) 4 MG tablet Take 1 tablet (4 mg total) by mouth every 8 (eight) hours as needed for nausea or vomiting. 20 tablet 0   polyethylene glycol (MIRALAX / GLYCOLAX) 17 g packet Take 17 g by mouth daily.     No current  facility-administered medications on file prior to visit.     PAST MEDICAL HISTORY: Past Medical History:  Diagnosis Date   Anemia    Anxiety    Chicken pox    Chronic headaches    Constipation    Fibroids    Iron deficiency anemia due to chronic blood loss    from heavy periods   Menorrhagia    Palpitations     PAST SURGICAL HISTORY: History reviewed. No pertinent surgical history.  SOCIAL HISTORY: Social History   Tobacco Use   Smoking status: Never Smoker   Smokeless tobacco: Never Used  Substance Use Topics   Alcohol use: Yes    Alcohol/week: 0.0 standard drinks    Comment: social   Drug use: No    FAMILY HISTORY: Family History  Problem Relation Age of Onset   Hypertension Mother    Cancer Mother    Diabetes Maternal Grandfather    Hypertension Maternal Uncle    Colon polyps Neg Hx    Colon cancer Neg Hx    Esophageal cancer Neg Hx     ROS: Review of Systems  Constitutional: Positive for weight loss.  Gastrointestinal: Negative for nausea and vomiting.    PHYSICAL EXAM: Pt in no acute distress  RECENT LABS AND TESTS: BMET    Component Value Date/Time   NA 140 10/16/2016 1853   NA 136 08/11/2016 1404   K 3.9 10/16/2016 1853   K 4.2 08/11/2016 1404   CL 107 10/16/2016 1853   CL 103 08/11/2016 1404   CO2 25 10/16/2016 1853   CO2 26 08/11/2016 1404   GLUCOSE 103 (H) 10/16/2016 1853   BUN 15 10/16/2016 1853   BUN 13 08/11/2016 1404   CREATININE 0.85 10/16/2016 1853   CREATININE 0.87 08/11/2016 1404   CALCIUM 9.1 10/16/2016 1853   CALCIUM 9.5 08/11/2016 1404   GFRNONAA >60 10/16/2016 1853   GFRAA >60 10/16/2016 1853   Lab Results  Component Value Date   HGBA1C 5.0 05/27/2018   HGBA1C 5.1 05/22/2017   HGBA1C 5.1 05/16/2016   HGBA1C 5.0 04/07/2015   Lab Results  Component Value Date   INSULIN 8.9 06/13/2018   CBC    Component Value Date/Time   WBC 8.1 11/27/2018 1044   WBC 8.2 10/16/2016 1853   RBC 5.04  11/27/2018 1044   HGB 14.8 11/27/2018 1044   HGB 14.2 04/20/2017 1524   HCT 46.0 11/27/2018 1044   HCT 43.2 04/20/2017 1524   PLT 211 11/27/2018 1044   PLT 199 04/20/2017 1524   MCV 91.3 11/27/2018 1044   MCV 90 04/20/2017 1524   MCH 29.4 11/27/2018 1044   MCHC 32.2 11/27/2018 1044   RDW 12.8 11/27/2018 1044   RDW 13.5 04/20/2017 1524   LYMPHSABS 1.5 11/27/2018 1044   LYMPHSABS 1.6 04/20/2017 1524   MONOABS 0.4 11/27/2018 1044   EOSABS 0.4 11/27/2018 1044  EOSABS 0.4 04/20/2017 1524   BASOSABS 0.0 11/27/2018 1044   BASOSABS 0.1 04/20/2017 1524   Iron/TIBC/Ferritin/ %Sat    Component Value Date/Time   IRON 52 11/27/2018 1044   IRON 103 04/20/2017 1524   TIBC 448 (H) 11/27/2018 1044   TIBC 450 (H) 04/20/2017 1524   FERRITIN 43 11/27/2018 1044   FERRITIN 23 04/20/2017 1524   IRONPCTSAT 12 (L) 11/27/2018 1044   IRONPCTSAT 23 04/20/2017 1524   IRONPCTSAT 18 (L) 10/30/2014 1213   Lipid Panel     Component Value Date/Time   CHOL 119 05/27/2018 0848   TRIG 52.0 05/27/2018 0848   HDL 50.60 05/27/2018 0848   CHOLHDL 2 05/27/2018 0848   VLDL 10.4 05/27/2018 0848   LDLCALC 58 05/27/2018 0848   Hepatic Function Panel     Component Value Date/Time   PROT 7.3 08/11/2016 1404   ALBUMIN 4.2 08/11/2016 1404   AST 9 08/11/2016 1404   ALT 8 08/11/2016 1404   ALKPHOS 58 08/11/2016 1404   BILITOT 0.5 08/11/2016 1404   BILIDIR 0.0 06/24/2012 1055      Component Value Date/Time   TSH 2.250 06/13/2018 1123   TSH 1.28 01/26/2016 1358   TSH 2.46 04/28/2014 0957     Ref. Range 06/13/2018 11:23  Vitamin D, 25-Hydroxy Latest Ref Range: 30.0 - 100.0 ng/mL 40.4    I, Doreene Nest, am acting as Location manager for Abby Potash, PA-C I, Abby Potash, PA-C have reviewed above note and agree with its content

## 2019-04-03 LAB — NOVEL CORONAVIRUS, NAA: SARS-CoV-2, NAA: NOT DETECTED

## 2019-04-05 ENCOUNTER — Other Ambulatory Visit (INDEPENDENT_AMBULATORY_CARE_PROVIDER_SITE_OTHER): Payer: Self-pay | Admitting: Physician Assistant

## 2019-04-05 DIAGNOSIS — E669 Obesity, unspecified: Secondary | ICD-10-CM

## 2019-04-05 DIAGNOSIS — Z6830 Body mass index (BMI) 30.0-30.9, adult: Secondary | ICD-10-CM

## 2019-04-08 ENCOUNTER — Other Ambulatory Visit (INDEPENDENT_AMBULATORY_CARE_PROVIDER_SITE_OTHER): Payer: Self-pay

## 2019-04-08 ENCOUNTER — Encounter (INDEPENDENT_AMBULATORY_CARE_PROVIDER_SITE_OTHER): Payer: Self-pay | Admitting: Physician Assistant

## 2019-04-08 DIAGNOSIS — Z6830 Body mass index (BMI) 30.0-30.9, adult: Secondary | ICD-10-CM

## 2019-04-08 MED ORDER — SAXENDA 18 MG/3ML ~~LOC~~ SOPN: 3.0000 mg | PEN_INJECTOR | Freq: Every day | SUBCUTANEOUS | 0 refills | Status: DC

## 2019-05-19 ENCOUNTER — Inpatient Hospital Stay: Payer: BC Managed Care – PPO | Attending: Family | Admitting: Family

## 2019-05-19 ENCOUNTER — Inpatient Hospital Stay: Payer: BC Managed Care – PPO

## 2019-05-19 ENCOUNTER — Other Ambulatory Visit: Payer: Self-pay

## 2019-05-19 ENCOUNTER — Telehealth: Payer: Self-pay | Admitting: Family

## 2019-05-19 ENCOUNTER — Encounter: Payer: Self-pay | Admitting: Family

## 2019-05-19 VITALS — BP 106/59 | HR 87 | Temp 97.1°F | Resp 18 | Ht 66.0 in | Wt 180.8 lb

## 2019-05-19 DIAGNOSIS — N920 Excessive and frequent menstruation with regular cycle: Secondary | ICD-10-CM | POA: Diagnosis present

## 2019-05-19 DIAGNOSIS — D500 Iron deficiency anemia secondary to blood loss (chronic): Secondary | ICD-10-CM | POA: Insufficient documentation

## 2019-05-19 LAB — CBC WITH DIFFERENTIAL (CANCER CENTER ONLY)
Abs Immature Granulocytes: 0.03 10*3/uL (ref 0.00–0.07)
Basophils Absolute: 0.1 10*3/uL (ref 0.0–0.1)
Basophils Relative: 1 %
Eosinophils Absolute: 0.4 10*3/uL (ref 0.0–0.5)
Eosinophils Relative: 4 %
HCT: 43.8 % (ref 36.0–46.0)
Hemoglobin: 14.2 g/dL (ref 12.0–15.0)
Immature Granulocytes: 0 %
Lymphocytes Relative: 20 %
Lymphs Abs: 2 10*3/uL (ref 0.7–4.0)
MCH: 29.8 pg (ref 26.0–34.0)
MCHC: 32.4 g/dL (ref 30.0–36.0)
MCV: 91.8 fL (ref 80.0–100.0)
Monocytes Absolute: 0.6 10*3/uL (ref 0.1–1.0)
Monocytes Relative: 6 %
Neutro Abs: 6.9 10*3/uL (ref 1.7–7.7)
Neutrophils Relative %: 69 %
Platelet Count: 214 10*3/uL (ref 150–400)
RBC: 4.77 MIL/uL (ref 3.87–5.11)
RDW: 12.8 % (ref 11.5–15.5)
WBC Count: 10 10*3/uL (ref 4.0–10.5)
nRBC: 0 % (ref 0.0–0.2)

## 2019-05-19 NOTE — Progress Notes (Signed)
Hematology and Oncology Follow Up Visit  Tamara Ball [id number] [birth year] 39 y.o. 05/19/2019   Principle Diagnosis:  Iron deficiency anemia secondary to menorrhagia  Current Therapy:   IV iron as indicated    Interim History:  Tamara Ball is here today for follow-up. She is symptomatic with increased headaches, dizzy/ligtheadedness, fatigue, and easy bruising.  Her cycle remains regular and very heavy the first 2 days (total of 7 days). She states that she is still on oral contraceptive but that the only benefit has been no growth of her fibroids. She has a follow-up appointment with her gynecologist on 06/02/19.  She had a colonoscopy in October and was found to have diverticula and 1 precancerous polyp (tubular adenoma, negative for high grade dysplasia) was removed.  She states that she has an appointment with her headache specialist later this month as well.  No fever, chills, n/v, cough, rash, SOB, chest pain, palpitations, abdominal pain or changes in bowel or bladder habits.  No swelling, tenderness, numbness or tingling in her extremities.  No falls or syncopal episodes to report.  She has maintained a good appetite and is hydrating appropriately. Her weight is stable.   ECOG Performance Status: 1 - Symptomatic but completely ambulatory  Medications:  Allergies as of 05/19/2019   No Known Allergies     Medication List       Accurate as of May 19, 2019  2:06 PM. If you have any questions, ask your nurse or doctor.        Aviane 0.1-20 MG-MCG tablet Generic drug: levonorgestrel-ethinyl estradiol   b complex vitamins tablet Take 1 tablet by mouth daily.   BD Pen Needle Nano 2nd Gen 32G X 4 MM Misc Generic drug: Insulin Pen Needle 1 Package by Does not apply route 2 (two) times daily.   Biotin 10000 MCG Tabs Take 1 tablet by mouth daily.   Dymista 137-50 MCG/ACT Susp Generic drug: Azelastine-Fluticasone 1 actuation each nostril twice daily   Fish Oil 1000  MG Caps Take 1 capsule by mouth daily.   hydrocortisone 2.5 % rectal cream Commonly known as: Anusol-HC Place 1 application rectally 2 (two) times daily.   levocetirizine 5 MG tablet Commonly known as: XYZAL Take 1 tablet (5 mg total) by mouth every evening.   montelukast 10 MG tablet Commonly known as: SINGULAIR Take 1 tablet (10 mg total) by mouth at bedtime.   multivitamin with minerals Tabs tablet Take 1 tablet by mouth daily.   ondansetron 4 MG tablet Commonly known as: Zofran Take 1 tablet (4 mg total) by mouth every 8 (eight) hours as needed for nausea or vomiting.   polyethylene glycol 17 g packet Commonly known as: MIRALAX / GLYCOLAX Take 17 g by mouth daily.   Saxenda 18 MG/3ML Sopn Generic drug: Liraglutide -Weight Management Inject 3 mg into the skin daily.       Allergies: No Known Allergies  Past Medical History, Surgical history, Social history, and Family History were reviewed and updated.  Review of Systems: All other 10 point review of systems is negative.   Physical Exam:  height is 5\' 6"  (1.676 m) and weight is 180 lb 12.8 oz (82 kg). Her temporal temperature is 97.1 F (36.2 C) (abnormal). Her blood pressure is 106/59 (abnormal) and her pulse is 87. Her respiration is 18 and oxygen saturation is 100%.   Wt Readings from Last 3 Encounters:  05/19/19 180 lb 12.8 oz (82 kg)  03/11/19 181 lb (82.1 kg)  02/25/19 183 lb (83 kg)    Ocular: Sclerae unicteric, pupils equal, round and reactive to light Ear-nose-throat: Oropharynx clear, dentition fair Lymphatic: No cervical or supraclavicular adenopathy Lungs no rales or rhonchi, good excursion bilaterally Heart regular rate and rhythm, no murmur appreciated Abd soft, nontender, positive bowel sounds, no liver or spleen tip palpated on exam, no fluid wave  MSK no focal spinal tenderness, no joint edema Neuro: non-focal, well-oriented, appropriate affect Breasts: Deferred   Lab Results  Component  Value Date   WBC 10.0 05/19/2019   HGB 14.2 05/19/2019   HCT 43.8 05/19/2019   MCV 91.8 05/19/2019   PLT 214 05/19/2019   Lab Results  Component Value Date   FERRITIN 43 11/27/2018   IRON 52 11/27/2018   TIBC 448 (H) 11/27/2018   UIBC 396 (H) 11/27/2018   IRONPCTSAT 12 (L) 11/27/2018   Lab Results  Component Value Date   RETICCTPCT 1.4 05/16/2016   RBC 4.77 05/19/2019   RETICCTABS 64,400 05/16/2016   No results found for: KPAFRELGTCHN, LAMBDASER, KAPLAMBRATIO No results found for: IGGSERUM, IGA, IGMSERUM No results found for: Odetta Pink, SPEI   Chemistry      Component Value Date/Time   NA 140 10/16/2016 1853   NA 136 08/11/2016 1404   K 3.9 10/16/2016 1853   K 4.2 08/11/2016 1404   CL 107 10/16/2016 1853   CL 103 08/11/2016 1404   CO2 25 10/16/2016 1853   CO2 26 08/11/2016 1404   BUN 15 10/16/2016 1853   BUN 13 08/11/2016 1404   CREATININE 0.85 10/16/2016 1853   CREATININE 0.87 08/11/2016 1404      Component Value Date/Time   CALCIUM 9.1 10/16/2016 1853   CALCIUM 9.5 08/11/2016 1404   ALKPHOS 58 08/11/2016 1404   AST 9 08/11/2016 1404   ALT 8 08/11/2016 1404   BILITOT 0.5 08/11/2016 1404       Impression and Plan: Tamara Ball is a very pleasant 39 yo African American female with iron deficiency secondary to heavy cycles.  We will see what her iron studies look like and replace if needed.  We will plan to see her back in another 4 months.  She will contact our office with any questions or concerns. We can certainly see her sooner if needed.   [name], NP 1/11/20212:06 PM

## 2019-05-19 NOTE — Telephone Encounter (Signed)
Appointments scheduled calendar printed per 1/11 los 

## 2019-05-20 LAB — IRON AND TIBC
Iron: 80 ug/dL (ref 41–142)
Saturation Ratios: 20 % — ABNORMAL LOW (ref 21–57)
TIBC: 390 ug/dL (ref 236–444)
UIBC: 310 ug/dL (ref 120–384)

## 2019-05-20 LAB — FERRITIN: Ferritin: 75 ng/mL (ref 11–307)

## 2019-05-27 ENCOUNTER — Encounter (INDEPENDENT_AMBULATORY_CARE_PROVIDER_SITE_OTHER): Payer: Self-pay | Admitting: Physician Assistant

## 2019-05-27 ENCOUNTER — Ambulatory Visit (INDEPENDENT_AMBULATORY_CARE_PROVIDER_SITE_OTHER): Payer: BC Managed Care – PPO | Admitting: Physician Assistant

## 2019-05-27 ENCOUNTER — Other Ambulatory Visit: Payer: Self-pay

## 2019-05-27 VITALS — BP 112/67 | HR 83 | Temp 97.6°F | Ht 66.0 in | Wt 175.0 lb

## 2019-05-27 DIAGNOSIS — E669 Obesity, unspecified: Secondary | ICD-10-CM

## 2019-05-27 DIAGNOSIS — Z9189 Other specified personal risk factors, not elsewhere classified: Secondary | ICD-10-CM

## 2019-05-27 DIAGNOSIS — Z6830 Body mass index (BMI) 30.0-30.9, adult: Secondary | ICD-10-CM

## 2019-05-27 DIAGNOSIS — R110 Nausea: Secondary | ICD-10-CM

## 2019-05-27 DIAGNOSIS — E559 Vitamin D deficiency, unspecified: Secondary | ICD-10-CM

## 2019-05-27 MED ORDER — SAXENDA 18 MG/3ML ~~LOC~~ SOPN: 3.0000 mg | PEN_INJECTOR | Freq: Every day | SUBCUTANEOUS | 0 refills | Status: DC

## 2019-05-27 MED ORDER — BD PEN NEEDLE NANO 2ND GEN 32G X 4 MM MISC: 1.0000 | Freq: Two times a day (BID) | 0 refills | Status: DC

## 2019-05-27 MED ORDER — ONDANSETRON HCL 4 MG PO TABS: 4.0000 mg | ORAL_TABLET | Freq: Three times a day (TID) | ORAL | 0 refills | Status: DC | PRN

## 2019-05-27 NOTE — Progress Notes (Signed)
Chief Complaint:   OBESITY Tamara Ball is here to discuss her progress with her obesity treatment plan along with follow-up of her obesity related diagnoses. Tamara Ball is on the Category 3 Plan and states she is following her eating plan approximately 75 to 80% of the time. Tamara Ball states she is exercising 0 minutes 0 times per week.  Today's visit was #: 14 Starting weight: 205 lbs Starting date: 06/13/2018 Today's weight: 175 lbs Today's date: 05/27/2019 Total lbs lost to date: 30 Total lbs lost since last in-office visit: 6  Interim History: Tamara Ball reports that Tamara Ball helps control her appetite and she can tell a difference in her hunger cues when she doesn't take it.  Subjective:   Nausea  Lucille is on Zofran, which helps her a great deal when taking Saxenda. Initially she had vomiting, but she now only has nausea intermittently.  Vitamin D deficiency  Tamara Ball's Vitamin D level was 40.4 on 06/13/2018. She is currently on a multi-vitamin. She denies nausea, vomiting or muscle weakness. Matteson is due for labs.  At risk for osteoporosis Tamara Ball is at higher risk of osteopenia and osteoporosis due to Vitamin D deficiency.   Assessment/Plan:   Nausea  Acire agrees to continue Zofran 4 mg #30 with no refills.  Vitamin D deficiency Low Vitamin D level contributes to fatigue and are associated with obesity, breast, and colon cancer. She will continue taking multi-vitamin and we will check labs at the next visit. She will follow-up for routine testing of Vitamin D, at least 2-3 times per year to avoid over-replacement.  At risk for osteoporosis Tamara Ball was given approximately 15 minutes of osteoporosis prevention counseling today. Tamara Ball is at risk for osteopenia and osteoporosis due to her Vitamin D deficiency. She was encouraged to take her Vitamin D and follow her higher calcium diet and increase strengthening exercise to help strengthen her bones and decrease her risk of osteopenia  and osteoporosis.  Repetitive spaced learning was employed today to elicit superior memory formation and behavioral change. Obesity Tamara Ball is currently in the action stage of change. As such, her goal is to continue with weight loss efforts. She has agreed to the Category 3 Plan.   Exercise goals: For substantial health benefits, adults should do at least 150 minutes (2 hours and 30 minutes) a week of moderate-intensity, or 75 minutes (1 hour and 15 minutes) a week of vigorous-intensity aerobic physical activity, or an equivalent combination of moderate- and vigorous-intensity aerobic activity. Aerobic activity should be performed in episodes of at least 10 minutes, and preferably, it should be spread throughout the week. Adults should also include muscle-strengthening activities that involve all major muscle groups on 2 or more days a week.  Behavioral modification strategies: no skipping meals and meal planning and cooking strategies.  Tamara Ball agreed to continue Saxenda and a prescription was written for #3 pens with no refills and a prescription was also written for #100 needles.  Tamara Ball has agreed to follow-up with our clinic in 2 weeks. She was informed of the importance of frequent follow-up visits to maximize her success with intensive lifestyle modifications for her multiple health conditions.   Objective:   Blood pressure 112/67, pulse 83, temperature 97.6 F (36.4 C), temperature source Oral, height 5\' 6"  (1.676 m), weight 175 lb (79.4 kg), SpO2 99 %. Body mass index is 28.25 kg/m.  General: Cooperative, alert, well developed, in no acute distress. HEENT: Conjunctivae and lids unremarkable. Cardiovascular: Regular rhythm.  Lungs: Normal  work of breathing. Neurologic: No focal deficits.   Lab Results  Component Value Date   CREATININE 0.85 10/16/2016   BUN 15 10/16/2016   NA 140 10/16/2016   K 3.9 10/16/2016   CL 107 10/16/2016   CO2 25 10/16/2016   Lab Results  Component  Value Date   ALT 8 08/11/2016   AST 9 08/11/2016   ALKPHOS 58 08/11/2016   BILITOT 0.5 08/11/2016   Lab Results  Component Value Date   HGBA1C 5.0 05/27/2018   HGBA1C 5.1 05/22/2017   HGBA1C 5.1 05/16/2016   HGBA1C 5.0 04/07/2015   Lab Results  Component Value Date   INSULIN 8.9 06/13/2018   Lab Results  Component Value Date   TSH 2.250 06/13/2018   Lab Results  Component Value Date   CHOL 119 05/27/2018   HDL 50.60 05/27/2018   LDLCALC 58 05/27/2018   TRIG 52.0 05/27/2018   CHOLHDL 2 05/27/2018   Lab Results  Component Value Date   WBC 10.0 05/19/2019   HGB 14.2 05/19/2019   HCT 43.8 05/19/2019   MCV 91.8 05/19/2019   PLT 214 05/19/2019   Lab Results  Component Value Date   IRON 80 05/19/2019   TIBC 390 05/19/2019   FERRITIN 75 05/19/2019    Ref. Range 06/13/2018 11:23  Vitamin D, 25-Hydroxy Latest Ref Range: 30.0 - 100.0 ng/mL 40.4    Attestation Statements:   Reviewed by clinician on day of visit: allergies, medications, problem list, medical history, surgical history, family history, social history, and previous encounter notes.  Corey Skains, am acting as Location manager for [employer], PA-C.  I have reviewed the above documentation for accuracy and completeness, and I agree with the above. Abby Potash, PA-C

## 2019-05-28 ENCOUNTER — Inpatient Hospital Stay: Payer: BC Managed Care – PPO

## 2019-05-28 VITALS — BP 113/60 | HR 81 | Temp 97.1°F | Resp 17

## 2019-05-28 DIAGNOSIS — N920 Excessive and frequent menstruation with regular cycle: Secondary | ICD-10-CM | POA: Diagnosis not present

## 2019-05-28 DIAGNOSIS — D500 Iron deficiency anemia secondary to blood loss (chronic): Secondary | ICD-10-CM

## 2019-05-28 MED ORDER — SODIUM CHLORIDE 0.9 % IV SOLN
INTRAVENOUS | Status: DC
  Filled 2019-05-28: qty 250

## 2019-05-28 MED ORDER — SODIUM CHLORIDE 0.9 % IV SOLN
510.0000 mg | Freq: Once | INTRAVENOUS | Status: AC
  Administered 2019-05-28: 14:00:00 510 mg via INTRAVENOUS
  Filled 2019-05-28: qty 17

## 2019-05-28 NOTE — Patient Instructions (Signed)

## 2019-06-02 LAB — HM MAMMOGRAPHY

## 2019-06-04 ENCOUNTER — Other Ambulatory Visit: Payer: Self-pay

## 2019-06-04 ENCOUNTER — Inpatient Hospital Stay: Payer: BC Managed Care – PPO

## 2019-06-04 VITALS — BP 118/61 | HR 80 | Temp 97.6°F | Resp 16

## 2019-06-04 DIAGNOSIS — D500 Iron deficiency anemia secondary to blood loss (chronic): Secondary | ICD-10-CM

## 2019-06-04 DIAGNOSIS — N920 Excessive and frequent menstruation with regular cycle: Secondary | ICD-10-CM | POA: Diagnosis not present

## 2019-06-04 MED ORDER — SODIUM CHLORIDE 0.9 % IV SOLN
INTRAVENOUS | Status: DC
  Filled 2019-06-04: qty 250

## 2019-06-04 MED ORDER — SODIUM CHLORIDE 0.9 % IV SOLN
510.0000 mg | Freq: Once | INTRAVENOUS | Status: AC
  Administered 2019-06-04: 510 mg via INTRAVENOUS
  Filled 2019-06-04: qty 17

## 2019-06-04 NOTE — Patient Instructions (Signed)

## 2019-06-05 ENCOUNTER — Encounter: Payer: Self-pay | Admitting: Family Medicine

## 2019-06-17 ENCOUNTER — Other Ambulatory Visit: Payer: Self-pay

## 2019-06-17 ENCOUNTER — Ambulatory Visit (INDEPENDENT_AMBULATORY_CARE_PROVIDER_SITE_OTHER): Payer: BC Managed Care – PPO | Admitting: Physician Assistant

## 2019-06-17 VITALS — BP 102/58 | HR 82 | Temp 98.1°F | Ht 66.0 in | Wt 174.0 lb

## 2019-06-17 DIAGNOSIS — Z9189 Other specified personal risk factors, not elsewhere classified: Secondary | ICD-10-CM

## 2019-06-17 DIAGNOSIS — Z6830 Body mass index (BMI) 30.0-30.9, adult: Secondary | ICD-10-CM

## 2019-06-17 DIAGNOSIS — R739 Hyperglycemia, unspecified: Secondary | ICD-10-CM

## 2019-06-17 DIAGNOSIS — E559 Vitamin D deficiency, unspecified: Secondary | ICD-10-CM | POA: Diagnosis not present

## 2019-06-17 DIAGNOSIS — R0602 Shortness of breath: Secondary | ICD-10-CM | POA: Diagnosis not present

## 2019-06-17 DIAGNOSIS — E669 Obesity, unspecified: Secondary | ICD-10-CM

## 2019-06-17 NOTE — Progress Notes (Signed)
Chief Complaint:   OBESITY Tamara Ball is here to discuss her progress with her obesity treatment plan along with follow-up of her obesity related diagnoses. Tamara Ball is on the Category 3 Plan and states she is following her eating plan approximately 70% of the time. Tamara Ball states she is exercising 0 minutes 0 times per week.  Today's visit was #: 15 Starting weight: 205 lbs Starting date: 06/13/2018 Today's weight: 174 lbs Today's date: 06/17/2019 Total lbs lost to date: 31 Total lbs lost since last in-office visit: 1  Interim History: Tamara Ball reports eating out more often over the last few weeks. She also states she has had more headaches recently and is now seeing a headache specialist.   Subjective:   Vitamin D deficiency. Last Vitamin D level 40.4 on 06/13/2018. Tamara Ball on a multivitamin and states she is on Vitamin D as well.  Shortness of breath on exertion. Tamara Ball notes increasing shortness of breath with exertion. She has a history of shortness of breath. She reports recent headaches as well.  Hyperglycemia. Tamara Ball has a history of some elevated blood glucose readings without a diagnosis of diabetes. She is on no medications. No polyphagia, polydipsia, or polyuria.  At risk for osteoporosis. Tamara Ball is at higher risk of osteopenia and osteoporosis due to Vitamin D deficiency.   Assessment/Plan:   Vitamin D deficiency.  Low Vitamin D level contributes to fatigue and are associated with obesity, breast, and colon cancer. VITAMIN D 25 Hydroxy (Vit-D Deficiency, Fractures) level ordered.  Shortness of breath on exertion. Junnie's shortness of breath appears to be obesity related and exercise induced. She has agreed to work on weight loss and gradually increase exercise to treat her exercise induced shortness of breath. Will continue to monitor closely. Lipid Panel With LDL/HDL Ratio labs ordered today.  Hyperglycemia. Fasting labs will be obtained and results with be discussed  with Tamara Ball in 2 weeks at her follow up visit. In the meanwhile Tamara Ball was started on a lower simple carbohydrate diet and will work on weight loss efforts. Comprehensive metabolic panel, Hemoglobin A1c, Insulin, random, Lipid Panel With LDL/HDL Ratio labs ordered.  At risk for osteoporosis. Tamara Ball was given approximately 15 minutes of osteoporosis prevention counseling today. Tamara Ball is at risk for osteopenia and osteoporosis due to her Vitamin D deficiency. She was encouraged to take her Vitamin D and follow her higher calcium diet and increase strengthening exercise to help strengthen her bones and decrease her risk of osteopenia and osteoporosis.  Repetitive spaced learning was employed today to elicit superior memory formation and behavioral change.  Class 1 obesity with serious comorbidity and body mass index (BMI) of 30.0 to 30.9 in adult, unspecified obesity type - BMI greater than 30 at start of program.  Tamara Ball is currently in the action stage of change. As such, her goal is to continue with weight loss efforts. She has agreed to keeping a food journal and adhering to recommended goals of 1500 calories and 95 grams of protein.   Exercise goals: For substantial health benefits, adults should do at least 150 minutes (2 hours and 30 minutes) a week of moderate-intensity, or 75 minutes (1 hour and 15 minutes) a week of vigorous-intensity aerobic physical activity, or an equivalent combination of moderate- and vigorous-intensity aerobic activity. Aerobic activity should be performed in episodes of at least 10 minutes, and preferably, it should be spread throughout the week.  Behavioral modification strategies: increasing lean protein intake and no skipping meals.  Tamara Ball  has agreed to follow-up with our clinic in 2 weeks. She was informed of the importance of frequent follow-up visits to maximize her success with intensive lifestyle modifications for her multiple health conditions.   Tamara Ball was  informed we would discuss her lab results at her next visit unless there is a critical issue that needs to be addressed sooner. Tamara Ball agreed to keep her next visit at the agreed upon time to discuss these results.  Objective:   Blood pressure (!) 102/58, pulse 82, temperature 98.1 F (36.7 C), temperature source Oral, height 5\' 6"  (1.676 m), weight 174 lb (78.9 kg), SpO2 100 %. Body mass index is 28.08 kg/m.  General: Cooperative, alert, well developed, in no acute distress. HEENT: Conjunctivae and lids unremarkable. Cardiovascular: Regular rhythm.  Lungs: Normal work of breathing. Neurologic: No focal deficits.   Lab Results  Component Value Date   CREATININE 0.85 10/16/2016   BUN 15 10/16/2016   NA 140 10/16/2016   K 3.9 10/16/2016   CL 107 10/16/2016   CO2 25 10/16/2016   Lab Results  Component Value Date   ALT 8 08/11/2016   AST 9 08/11/2016   ALKPHOS 58 08/11/2016   BILITOT 0.5 08/11/2016   Lab Results  Component Value Date   HGBA1C 5.0 05/27/2018   HGBA1C 5.1 05/22/2017   HGBA1C 5.1 05/16/2016   HGBA1C 5.0 04/07/2015   Lab Results  Component Value Date   INSULIN 8.9 06/13/2018   Lab Results  Component Value Date   TSH 2.250 06/13/2018   Lab Results  Component Value Date   CHOL 119 05/27/2018   HDL 50.60 05/27/2018   LDLCALC 58 05/27/2018   TRIG 52.0 05/27/2018   CHOLHDL 2 05/27/2018   Lab Results  Component Value Date   WBC 10.0 05/19/2019   HGB 14.2 05/19/2019   HCT 43.8 05/19/2019   MCV 91.8 05/19/2019   PLT 214 05/19/2019   Lab Results  Component Value Date   IRON 80 05/19/2019   TIBC 390 05/19/2019   FERRITIN 75 05/19/2019   Attestation Statements:   Reviewed by clinician on day of visit: allergies, medications, problem list, medical history, surgical history, family history, social history, and previous encounter notes.  IMichaelene Song, am acting as transcriptionist for Abby Potash, PA-C   I have reviewed the above documentation  for accuracy and completeness, and I agree with the above. Abby Potash, PA-C

## 2019-06-18 ENCOUNTER — Encounter (INDEPENDENT_AMBULATORY_CARE_PROVIDER_SITE_OTHER): Payer: Self-pay | Admitting: Physician Assistant

## 2019-06-18 NOTE — Telephone Encounter (Signed)
Please advise 

## 2019-06-19 LAB — LIPID PANEL WITH LDL/HDL RATIO
Cholesterol, Total: 109 mg/dL (ref 100–199)
HDL: 49 mg/dL (ref >39)
LDL Chol Calc (NIH): 46 mg/dL (ref 0–99)
LDL/HDL Ratio: 0.9 ratio (ref 0.0–3.2)
Triglycerides: 64 mg/dL (ref 0–149)
VLDL Cholesterol Cal: 14 mg/dL (ref 5–40)

## 2019-06-19 LAB — COMPREHENSIVE METABOLIC PANEL
ALT: 13 IU/L (ref 0–32)
AST: 16 IU/L (ref 0–40)
Albumin/Globulin Ratio: 1.5 (ref 1.2–2.2)
Albumin: 4 g/dL (ref 3.8–4.8)
Alkaline Phosphatase: 59 IU/L (ref 39–117)
BUN/Creatinine Ratio: 11 (ref 9–23)
BUN: 10 mg/dL (ref 6–24)
Bilirubin Total: 0.4 mg/dL (ref 0.0–1.2)
CO2: 19 mmol/L — ABNORMAL LOW (ref 20–29)
Calcium: 9.5 mg/dL (ref 8.7–10.2)
Chloride: 104 mmol/L (ref 96–106)
Creatinine, Ser: 0.92 mg/dL (ref 0.57–1.00)
GFR calc Af Amer: 90 mL/min/{1.73_m2} (ref >59)
GFR calc non Af Amer: 78 mL/min/{1.73_m2} (ref >59)
Globulin, Total: 2.7 g/dL (ref 1.5–4.5)
Glucose: 80 mg/dL (ref 65–99)
Potassium: 4.1 mmol/L (ref 3.5–5.2)
Sodium: 139 mmol/L (ref 134–144)
Total Protein: 6.7 g/dL (ref 6.0–8.5)

## 2019-06-19 LAB — HEMOGLOBIN A1C
Est. average glucose Bld gHb Est-mCnc: 94 mg/dL
Hgb A1c MFr Bld: 4.9 % (ref 4.8–5.6)

## 2019-06-19 LAB — VITAMIN D 25 HYDROXY (VIT D DEFICIENCY, FRACTURES): Vit D, 25-Hydroxy: 53.6 ng/mL (ref 30.0–100.0)

## 2019-06-19 LAB — INSULIN, RANDOM: INSULIN: 9.1 u[IU]/mL (ref 2.6–24.9)

## 2019-07-01 ENCOUNTER — Encounter (INDEPENDENT_AMBULATORY_CARE_PROVIDER_SITE_OTHER): Payer: Self-pay | Admitting: Physician Assistant

## 2019-07-01 ENCOUNTER — Other Ambulatory Visit: Payer: Self-pay

## 2019-07-01 ENCOUNTER — Ambulatory Visit (INDEPENDENT_AMBULATORY_CARE_PROVIDER_SITE_OTHER): Payer: BC Managed Care – PPO | Admitting: Physician Assistant

## 2019-07-01 VITALS — BP 106/51 | HR 75 | Temp 98.8°F | Ht 66.0 in | Wt 175.0 lb

## 2019-07-01 DIAGNOSIS — Z6830 Body mass index (BMI) 30.0-30.9, adult: Secondary | ICD-10-CM | POA: Diagnosis not present

## 2019-07-01 DIAGNOSIS — Z9189 Other specified personal risk factors, not elsewhere classified: Secondary | ICD-10-CM

## 2019-07-01 DIAGNOSIS — E559 Vitamin D deficiency, unspecified: Secondary | ICD-10-CM

## 2019-07-01 DIAGNOSIS — E669 Obesity, unspecified: Secondary | ICD-10-CM

## 2019-07-01 MED ORDER — SAXENDA 18 MG/3ML ~~LOC~~ SOPN: 3.0000 mg | PEN_INJECTOR | Freq: Every day | SUBCUTANEOUS | 0 refills | Status: DC

## 2019-07-02 NOTE — Progress Notes (Signed)
Chief Complaint:   OBESITY Tamara Ball is here to discuss her progress with her obesity treatment plan along with follow-up of her obesity related diagnoses. [name] is on the Category 3 Plan and states she is following her eating plan approximately 60% of the time. [name] states she is exercising 0 minutes 0 times per week.  Today's visit was #: 66 Starting weight: 205 lbs Starting date: 06/13/2018 Today's weight: 175 lbs Today's date: 07/01/2019 Total lbs lost to date: 30 Total lbs lost since last in-office visit: 0  Interim History: [name] reports doing well with lunch. She has trouble getting all of the protein in at dinner. She is hungry when only taking 1.8 mg of Saxenda, but is nauseous when taking 2.4 mg.  Subjective:   Vitamin D deficiency. No nausea, vomiting, or muscle weakness on 2,000 units. Last Vitamin D level at goal at 53.6 on 06/17/2019.  At risk for osteoporosis. [name] is at higher risk of osteopenia and osteoporosis due to Vitamin D deficiency.   Class 1 obesity with serious comorbidity and body mass index (BMI) of 30.0 to 30.9 in adult, unspecified obesity type - BMI greater than 30 at start of program. [name] notes that 1.8 mg of Saxenda does not decrease her appetite, but 2.4 mg makes her nauseous.  Assessment/Plan:   Vitamin D deficiency. Low Vitamin D level contributes to fatigue and are associated with obesity, breast, and colon cancer. She agrees to continue to take Vitamin D and will follow-up for routine testing of Vitamin D, at least 2-3 times per year to avoid over-replacement.  At risk for osteoporosis. [name] was given approximately 15 minutes of osteoporosis prevention counseling today. [name] is at risk for osteopenia and osteoporosis due to her Vitamin D deficiency. She was encouraged to take her Vitamin D and follow her higher calcium diet and increase strengthening exercise to help strengthen her bones and decrease her risk of osteopenia and  osteoporosis.  Repetitive spaced learning was employed today to elicit superior memory formation and behavioral change.  Class 1 obesity with serious comorbidity and body mass index (BMI) of 30.0 to 30.9 in adult, unspecified obesity type - BMI greater than 30 at start of program. [name] was given a refill on her Liraglutide -Weight Management (SAXENDA) 18 MG/3ML SOPN #5 with 0 refills.  [name] is currently in the action stage of change. As such, her goal is to continue with weight loss efforts. She has agreed to the Category 3 Plan.   Exercise goals: For substantial health benefits, adults should do at least 150 minutes (2 hours and 30 minutes) a week of moderate-intensity, or 75 minutes (1 hour and 15 minutes) a week of vigorous-intensity aerobic physical activity, or an equivalent combination of moderate- and vigorous-intensity aerobic activity. Aerobic activity should be performed in episodes of at least 10 minutes, and preferably, it should be spread throughout the week.  Behavioral modification strategies: meal planning and cooking strategies and keeping healthy foods in the home.  [name] has agreed to follow-up with our clinic in 2-3 weeks. She was informed of the importance of frequent follow-up visits to maximize her success with intensive lifestyle modifications for her multiple health conditions.   Objective:   Blood pressure (!) 106/51, pulse 75, temperature 98.8 F (37.1 C), temperature source Oral, height 5\' 6"  (1.676 m), weight 175 lb (79.4 kg), SpO2 100 %. Body mass index is 28.25 kg/m.  General: Cooperative, alert, well developed, in no acute distress. HEENT: Conjunctivae  and lids unremarkable. Cardiovascular: Regular rhythm.  Lungs: Normal work of breathing. Neurologic: No focal deficits.   Lab Results  Component Value Date   CREATININE 0.92 06/17/2019   BUN 10 06/17/2019   NA 139 06/17/2019   K 4.1 06/17/2019   CL 104 06/17/2019   CO2 19 (L) 06/17/2019   Lab  Results  Component Value Date   ALT 13 06/17/2019   AST 16 06/17/2019   ALKPHOS 59 06/17/2019   BILITOT 0.4 06/17/2019   Lab Results  Component Value Date   HGBA1C 4.9 06/17/2019   HGBA1C 5.0 05/27/2018   HGBA1C 5.1 05/22/2017   HGBA1C 5.1 05/16/2016   HGBA1C 5.0 04/07/2015   Lab Results  Component Value Date   INSULIN 9.1 06/17/2019   INSULIN 8.9 06/13/2018   Lab Results  Component Value Date   TSH 2.250 06/13/2018   Lab Results  Component Value Date   CHOL 109 06/17/2019   HDL 49 06/17/2019   LDLCALC 46 06/17/2019   TRIG 64 06/17/2019   CHOLHDL 2 05/27/2018   Lab Results  Component Value Date   WBC 10.0 05/19/2019   HGB 14.2 05/19/2019   HCT 43.8 05/19/2019   MCV 91.8 05/19/2019   PLT 214 05/19/2019   Lab Results  Component Value Date   IRON 80 05/19/2019   TIBC 390 05/19/2019   FERRITIN 75 05/19/2019   Attestation Statements:   Reviewed by clinician on day of visit: allergies, medications, problem list, medical history, surgical history, family history, social history, and previous encounter notes.  IMichaelene Song, am acting as transcriptionist for Abby Potash, PA-C   I have reviewed the above documentation for accuracy and completeness, and I agree with the above. Abby Potash, PA-C

## 2019-07-09 ENCOUNTER — Encounter: Payer: Self-pay | Admitting: Family Medicine

## 2019-07-10 MED ORDER — LEVOCETIRIZINE DIHYDROCHLORIDE 5 MG PO TABS: 5.0000 mg | ORAL_TABLET | Freq: Every evening | ORAL | 1 refills | Status: DC

## 2019-07-10 MED ORDER — MONTELUKAST SODIUM 10 MG PO TABS: 10.0000 mg | ORAL_TABLET | Freq: Every day | ORAL | 1 refills | Status: DC

## 2019-07-11 ENCOUNTER — Encounter (HOSPITAL_BASED_OUTPATIENT_CLINIC_OR_DEPARTMENT_OTHER): Payer: Self-pay | Admitting: *Deleted

## 2019-07-11 ENCOUNTER — Other Ambulatory Visit: Payer: Self-pay

## 2019-07-11 ENCOUNTER — Telehealth: Payer: Self-pay | Admitting: Family Medicine

## 2019-07-11 ENCOUNTER — Emergency Department (HOSPITAL_BASED_OUTPATIENT_CLINIC_OR_DEPARTMENT_OTHER)
Admission: EM | Admit: 2019-07-11 | Discharge: 2019-07-11 | Disposition: A | Discharge status: Home / Self Care | Payer: BC Managed Care – PPO | Attending: Emergency Medicine | Admitting: Emergency Medicine

## 2019-07-11 DIAGNOSIS — Z79899 Other long term (current) drug therapy: Secondary | ICD-10-CM | POA: Insufficient documentation

## 2019-07-11 DIAGNOSIS — N938 Other specified abnormal uterine and vaginal bleeding: Secondary | ICD-10-CM

## 2019-07-11 DIAGNOSIS — D219 Benign neoplasm of connective and other soft tissue, unspecified: Secondary | ICD-10-CM

## 2019-07-11 DIAGNOSIS — Z793 Long term (current) use of hormonal contraceptives: Secondary | ICD-10-CM | POA: Insufficient documentation

## 2019-07-11 DIAGNOSIS — D259 Leiomyoma of uterus, unspecified: Secondary | ICD-10-CM | POA: Diagnosis not present

## 2019-07-11 DIAGNOSIS — N939 Abnormal uterine and vaginal bleeding, unspecified: Secondary | ICD-10-CM | POA: Diagnosis present

## 2019-07-11 LAB — URINALYSIS, MICROSCOPIC (REFLEX)

## 2019-07-11 LAB — URINALYSIS, ROUTINE W REFLEX MICROSCOPIC
Bilirubin Urine: NEGATIVE
Glucose, UA: NEGATIVE mg/dL
Ketones, ur: 15 mg/dL — AB
Nitrite: NEGATIVE
Protein, ur: NEGATIVE mg/dL
Specific Gravity, Urine: 1.025 (ref 1.005–1.030)
pH: 6.5 (ref 5.0–8.0)

## 2019-07-11 LAB — BASIC METABOLIC PANEL
Anion gap: 8 (ref 5–15)
BUN: 12 mg/dL (ref 6–20)
CO2: 25 mmol/L (ref 22–32)
Calcium: 9.3 mg/dL (ref 8.9–10.3)
Chloride: 105 mmol/L (ref 98–111)
Creatinine, Ser: 1.02 mg/dL — ABNORMAL HIGH (ref 0.44–1.00)
GFR calc Af Amer: >60 mL/min (ref >60)
GFR calc non Af Amer: >60 mL/min (ref >60)
Glucose, Bld: 88 mg/dL (ref 70–99)
Potassium: 3.5 mmol/L (ref 3.5–5.1)
Sodium: 138 mmol/L (ref 135–145)

## 2019-07-11 LAB — CBC WITH DIFFERENTIAL/PLATELET
Abs Immature Granulocytes: 0.02 10*3/uL (ref 0.00–0.07)
Basophils Absolute: 0.1 10*3/uL (ref 0.0–0.1)
Basophils Relative: 1 %
Eosinophils Absolute: 0.5 10*3/uL (ref 0.0–0.5)
Eosinophils Relative: 6 %
HCT: 44.2 % (ref 36.0–46.0)
Hemoglobin: 14.5 g/dL (ref 12.0–15.0)
Immature Granulocytes: 0 %
Lymphocytes Relative: 21 %
Lymphs Abs: 1.8 10*3/uL (ref 0.7–4.0)
MCH: 30.3 pg (ref 26.0–34.0)
MCHC: 32.8 g/dL (ref 30.0–36.0)
MCV: 92.5 fL (ref 80.0–100.0)
Monocytes Absolute: 0.5 10*3/uL (ref 0.1–1.0)
Monocytes Relative: 6 %
Neutro Abs: 5.7 10*3/uL (ref 1.7–7.7)
Neutrophils Relative %: 66 %
Platelets: 258 10*3/uL (ref 150–400)
RBC: 4.78 MIL/uL (ref 3.87–5.11)
RDW: 13.6 % (ref 11.5–15.5)
WBC: 8.6 10*3/uL (ref 4.0–10.5)
nRBC: 0 % (ref 0.0–0.2)

## 2019-07-11 LAB — PREGNANCY, URINE: Preg Test, Ur: NEGATIVE

## 2019-07-11 MED ORDER — MEGESTROL ACETATE 40 MG PO TABS: ORAL_TABLET | ORAL | 0 refills | Status: DC

## 2019-07-11 NOTE — ED Triage Notes (Signed)
Heavy vaginal bleeding since February 22. She has a hx of fibroids. She is taking hormones to try and control the bleeding with no improvement. Hx of anemia. States she gets iron infusions for anemia.

## 2019-07-11 NOTE — Telephone Encounter (Signed)
Spoke with the pt and informed her of the message below. Patient stated she will go to an urgent care close to her home and was advised to call back if needed.

## 2019-07-11 NOTE — Telephone Encounter (Signed)
Pt has been bleeding heavily for about 2 weeks, blood clots, has leg cramps, fatigue, light headed off/on. Pt is currently on birth control and is not sure if that is causing bleeding and blood clots. Pt would like to know if she needs to be seen in the office? Thanks

## 2019-07-11 NOTE — ED Provider Notes (Signed)
[facility] EMERGENCY DEPARTMENT Provider Note   CSN: [id number] Arrival date & time: 07/11/19  1657     History No chief complaint on file.   Tamara Ball is a 39 y.o. female.  Patient is a 39 year old female with history of anemia, uterine fibroids.  She presents today for evaluation of vaginal bleeding.  She describes a "constant period" for the past two weeks.  She feels weak but denies dizziness.  She spoke with her primary doctor and was instructed to come to the ER to be evaluated.  Patient normally takes oral contraceptives and was instructed recently to begin taking her next pack as soon as she completes her last, and not wait the week in between.  This does not seem to be helping.  Patient also has a history of anemia and has received iron infusions in the past, most recently last week.  The history is provided by the patient.       Past Medical History:  Diagnosis Date  . Anemia   . Anxiety   . Chicken pox   . Chronic headaches   . Constipation   . Fibroids   . Iron deficiency anemia due to chronic blood loss    from heavy periods  . Menorrhagia   . Palpitations     Patient Active Problem List   Diagnosis Date Noted  . Class 1 obesity with serious comorbidity and body mass index (BMI) of 33.0 to 33.9 in adult 06/17/2018  . Menorrhagia   . Iron deficiency anemia due to chronic blood loss   . Allergic rhinitis 02/05/2014    History reviewed. No pertinent surgical history.   OB History    Gravida  3   Para      Term      Preterm      AB      Living  1     SAB      TAB      Ectopic      Multiple      Live Births              Family History  Problem Relation Age of Onset  . Hypertension Mother   . Cancer Mother   . Diabetes Maternal Grandfather   . Hypertension Maternal Uncle   . Colon polyps Neg Hx   . Colon cancer Neg Hx   . Esophageal cancer Neg Hx     Social History   Tobacco Use  . Smoking status: Never  Smoker  . Smokeless tobacco: Never Used  Substance Use Topics  . Alcohol use: Yes    Alcohol/week: 0.0 standard drinks    Comment: social  . Drug use: No    Home Medications Prior to Admission medications   Medication Sig Start Date End Date Taking? Authorizing Provider  AVIANE 0.1-20 MG-MCG tablet  09/19/16   [provider]  b complex vitamins tablet Take 1 tablet by mouth daily.    [provider]  baclofen (LIORESAL) 10 MG tablet Take 10 mg by mouth as needed for muscle spasms.    [provider]  Biotin 10000 MCG TABS Take 1 tablet by mouth daily.    [provider]  DYMISTA 137-50 MCG/ACT SUSP 1 actuation each nostril twice daily 01/14/19   [name], DO  hydrocortisone (ANUSOL-HC) 2.5 % rectal cream Place 1 application rectally 2 (two) times daily. 12/17/18   [name], DO  Insulin Pen Needle (BD PEN NEEDLE  NANO 2ND GEN) 32G X 4 MM MISC 1 Package by Does not apply route 2 (two) times daily. 05/27/19   Abby Potash, PA-C  levocetirizine (XYZAL) 5 MG tablet Take 1 tablet (5 mg total) by mouth every evening. 07/10/19   [name], DO  Liraglutide -Weight Management (SAXENDA) 18 MG/3ML SOPN Inject 3 mg into the skin daily. 07/01/19   Abby Potash, PA-C  montelukast (SINGULAIR) 10 MG tablet Take 1 tablet (10 mg total) by mouth at bedtime. 07/10/19   [name], DO  Multiple Vitamin (MULTIVITAMIN WITH MINERALS) TABS tablet Take 1 tablet by mouth daily.    [provider]  Omega-3 Fatty Acids (FISH OIL) 1000 MG CAPS Take 1 capsule by mouth daily.    [provider]  ondansetron (ZOFRAN) 4 MG tablet Take 1 tablet (4 mg total) by mouth every 8 (eight) hours as needed for nausea or vomiting. 05/27/19   Abby Potash, PA-C  polyethylene glycol (MIRALAX / GLYCOLAX) 17 g packet Take 17 g by mouth daily.    [provider]  zonisamide (ZONEGRAN) 25 MG capsule Take 25 mg by mouth daily.    [provider]     Allergies    Patient has no known allergies.  Review of Systems   Review of Systems  All other systems reviewed and are negative.   Physical Exam Updated Vital Signs BP 124/87   Pulse 74   Temp 98.1 F (36.7 C) (Oral)   Resp 20   Ht 5\' 6"  (1.676 m)   Wt 78.9 kg   SpO2 100%   BMI 28.08 kg/m   Physical Exam Vitals and nursing note reviewed.  Constitutional:      General: She is not in acute distress.    Appearance: She is well-developed. She is not diaphoretic.  HENT:     Head: Normocephalic and atraumatic.  Cardiovascular:     Rate and Rhythm: Normal rate and regular rhythm.     Heart sounds: No murmur. No friction rub. No gallop.   Pulmonary:     Effort: Pulmonary effort is normal. No respiratory distress.     Breath sounds: Normal breath sounds. No wheezing.  Abdominal:     General: Bowel sounds are normal. There is no distension.     Palpations: Abdomen is soft.     Tenderness: There is no abdominal tenderness.  Musculoskeletal:        General: Normal range of motion.     Cervical back: Normal range of motion and neck supple.  Skin:    General: Skin is warm and dry.  Neurological:     Mental Status: She is alert and oriented to person, place, and time.     ED Results / Procedures / Treatments   Labs (all labs ordered are listed, but only abnormal results are displayed) Labs Reviewed  BASIC METABOLIC PANEL  CBC WITH DIFFERENTIAL/PLATELET  URINALYSIS, ROUTINE W REFLEX MICROSCOPIC  PREGNANCY, URINE    EKG None  Radiology No results found.  Procedures Procedures (including critical care time)  Medications Ordered in ED Medications - No data to display  ED Course  I have reviewed the triage vital signs and the nursing notes.  Pertinent labs & imaging results that were available during my care of the patient were reviewed by me and considered in my medical decision making (see chart for details).    MDM Rules/Calculators/A&P  Patient is  a 39 year old female with history of known fibroids presenting with complaints  of vaginal bleeding.  She reports what she describes as a heavy period for the past 2 weeks.  She is hemodynamically stable and hemoglobin has returned at 14.5.  Pregnancy test is negative and laboratory studies are otherwise unremarkable.  I spoke with Dr. Elonda Husky from GYN.  We will stop her Aviane and prescribe a course of Megace.  Patient to follow-up with her GYN in the next few days.  Final Clinical Impression(s) / ED Diagnoses Final diagnoses:  None    Rx / DC Orders ED Discharge Orders    None       Veryl Speak, MD 07/11/19 1801

## 2019-07-11 NOTE — Discharge Instructions (Addendum)
Begin taking Megace as prescribed.  Stop taking your Aviane.  Follow-up with your OB/GYN in the next 3 to 4 days, and return to the ER if you develop any new or worsening symptoms.

## 2019-07-11 NOTE — Telephone Encounter (Signed)
Yes should be seen and should have bloodwork. If light headed/dizzy could do urgent care as start so she is not waiting through the weekend. Our lab is closing soon so we cannot draw today unfortunately. If she feels that symptoms are stable, we can see about appointment next week?

## 2019-07-21 ENCOUNTER — Ambulatory Visit (INDEPENDENT_AMBULATORY_CARE_PROVIDER_SITE_OTHER): Payer: BC Managed Care – PPO | Admitting: Physician Assistant

## 2019-07-21 ENCOUNTER — Other Ambulatory Visit: Payer: Self-pay

## 2019-07-21 ENCOUNTER — Encounter (INDEPENDENT_AMBULATORY_CARE_PROVIDER_SITE_OTHER): Payer: Self-pay | Admitting: Physician Assistant

## 2019-07-21 VITALS — BP 102/67 | HR 90 | Temp 97.7°F | Ht 66.0 in | Wt 173.0 lb

## 2019-07-21 DIAGNOSIS — E8881 Metabolic syndrome: Secondary | ICD-10-CM

## 2019-07-21 DIAGNOSIS — Z6830 Body mass index (BMI) 30.0-30.9, adult: Secondary | ICD-10-CM | POA: Diagnosis not present

## 2019-07-21 DIAGNOSIS — E669 Obesity, unspecified: Secondary | ICD-10-CM | POA: Diagnosis not present

## 2019-07-21 DIAGNOSIS — E66811 Obesity, class 1: Secondary | ICD-10-CM

## 2019-07-21 DIAGNOSIS — E88819 Insulin resistance, unspecified: Secondary | ICD-10-CM

## 2019-07-21 NOTE — Progress Notes (Signed)
Chief Complaint:   OBESITY Tamara Ball is here to discuss her progress with her obesity treatment plan along with follow-up of her obesity related diagnoses. Tamara Ball is on the Category 3 Plan and states she is following her eating plan approximately 70% of the time. Tamara Ball states she is exercising 0 minutes 0 times per week.  Today's visit was #: 51 Starting weight: 205 lbs Starting date: 06/13/2018 Today's weight: 173 lbs Today's date: 07/21/2019 Total lbs lost to date: 32 Total lbs lost since last in-office visit: 2  Interim History: Tamara Ball reports that she has done a better job getting in more protein the last few weeks. She continues to report excessive hunger and has to eat multiple small meals a day to satisfy her hunger. She is on Saxenda 2.4 mg daily. She is not exercising.    Subjective:   Insulin resistance. Tamara Ball has a diagnosis of insulin resistance based on her elevated fasting insulin level >5. She continues to work on diet and exercise to decrease her risk of diabetes. She is on no medications. She reports polyphagia. She is not exercising.  Lab Results  Component Value Date   INSULIN 9.1 06/17/2019   INSULIN 8.9 06/13/2018   Lab Results  Component Value Date   HGBA1C 4.9 06/17/2019   Assessment/Plan:   Insulin resistance. Tamara Ball will continue to work on weight loss, start exercis, and decreasing simple carbohydrates to help decrease the risk of diabetes. Tamara Ball agreed to follow-up with [name] as directed to closely monitor her progress.  Class 1 obesity with serious comorbidity and body mass index (BMI) of 30.0 to 30.9 in adult, unspecified obesity type - BMI greater than 30 at start of program.   Tamara Ball is currently in the action stage of change. As such, her goal is to continue with weight loss efforts. She has agreed to the Category 3 Plan.   Exercise goals: For substantial health benefits, adults should do at least 150 minutes (2 hours and 30 minutes) a  week of moderate-intensity, or 75 minutes (1 hour and 15 minutes) a week of vigorous-intensity aerobic physical activity, or an equivalent combination of moderate- and vigorous-intensity aerobic activity. Aerobic activity should be performed in episodes of at least 10 minutes, and preferably, it should be spread throughout the week.  Behavioral modification strategies: increasing lean protein intake and meal planning and cooking strategies.  Tamara Ball has agreed to follow-up with our clinic in 2 weeks. She was informed of the importance of frequent follow-up visits to maximize her success with intensive lifestyle modifications for her multiple health conditions.   Objective:   Blood pressure 102/67, pulse 90, temperature 97.7 F (36.5 C), temperature source Oral, height 5\' 6"  (1.676 m), weight 173 lb (78.5 kg), SpO2 99 %. Body mass index is 27.92 kg/m.  General: Cooperative, alert, well developed, in no acute distress. HEENT: Conjunctivae and lids unremarkable. Cardiovascular: Regular rhythm.  Lungs: Normal work of breathing. Neurologic: No focal deficits.   Lab Results  Component Value Date   CREATININE 1.02 (H) 07/11/2019   BUN 12 07/11/2019   NA 138 07/11/2019   K 3.5 07/11/2019   CL 105 07/11/2019   CO2 25 07/11/2019   Lab Results  Component Value Date   ALT 13 06/17/2019   AST 16 06/17/2019   ALKPHOS 59 06/17/2019   BILITOT 0.4 06/17/2019   Lab Results  Component Value Date   HGBA1C 4.9 06/17/2019   HGBA1C 5.0 05/27/2018   HGBA1C 5.1 05/22/2017  HGBA1C 5.1 05/16/2016   HGBA1C 5.0 04/07/2015   Lab Results  Component Value Date   INSULIN 9.1 06/17/2019   INSULIN 8.9 06/13/2018   Lab Results  Component Value Date   TSH 2.250 06/13/2018   Lab Results  Component Value Date   CHOL 109 06/17/2019   HDL 49 06/17/2019   LDLCALC 46 06/17/2019   TRIG 64 06/17/2019   CHOLHDL 2 05/27/2018   Lab Results  Component Value Date   WBC 8.6 07/11/2019   HGB 14.5  07/11/2019   HCT 44.2 07/11/2019   MCV 92.5 07/11/2019   PLT 258 07/11/2019   Lab Results  Component Value Date   IRON 80 05/19/2019   TIBC 390 05/19/2019   FERRITIN 75 05/19/2019   Attestation Statements:   Reviewed by clinician on day of visit: allergies, medications, problem list, medical history, surgical history, family history, social history, and previous encounter notes.  Time spent on visit including pre-visit chart review and post-visit charting and care was 22 minutes.   IMichaelene Song, am acting as transcriptionist for Abby Potash, PA-C   I have reviewed the above documentation for accuracy and completeness, and I agree with the above. Abby Potash, PA-C

## 2019-07-31 ENCOUNTER — Encounter (INDEPENDENT_AMBULATORY_CARE_PROVIDER_SITE_OTHER): Payer: Self-pay | Admitting: Physician Assistant

## 2019-08-05 ENCOUNTER — Ambulatory Visit (INDEPENDENT_AMBULATORY_CARE_PROVIDER_SITE_OTHER): Payer: BC Managed Care – PPO | Admitting: Physician Assistant

## 2019-08-24 ENCOUNTER — Encounter: Payer: Self-pay | Admitting: Family Medicine

## 2019-08-24 ENCOUNTER — Encounter: Payer: Self-pay | Admitting: Family

## 2019-08-25 ENCOUNTER — Encounter (INDEPENDENT_AMBULATORY_CARE_PROVIDER_SITE_OTHER): Payer: Self-pay | Admitting: Physician Assistant

## 2019-08-25 NOTE — Telephone Encounter (Signed)
Please advise her nxt appt is 09/02/19

## 2019-09-02 ENCOUNTER — Ambulatory Visit (INDEPENDENT_AMBULATORY_CARE_PROVIDER_SITE_OTHER): Payer: BC Managed Care – PPO | Admitting: Physician Assistant

## 2019-09-02 ENCOUNTER — Encounter (INDEPENDENT_AMBULATORY_CARE_PROVIDER_SITE_OTHER): Payer: Self-pay | Admitting: Physician Assistant

## 2019-09-02 ENCOUNTER — Other Ambulatory Visit: Payer: Self-pay

## 2019-09-02 VITALS — BP 115/76 | HR 67 | Temp 98.4°F | Ht 66.0 in | Wt 182.0 lb

## 2019-09-02 DIAGNOSIS — Z9189 Other specified personal risk factors, not elsewhere classified: Secondary | ICD-10-CM

## 2019-09-02 DIAGNOSIS — E8881 Metabolic syndrome: Secondary | ICD-10-CM | POA: Diagnosis not present

## 2019-09-02 DIAGNOSIS — Z6830 Body mass index (BMI) 30.0-30.9, adult: Secondary | ICD-10-CM | POA: Diagnosis not present

## 2019-09-02 DIAGNOSIS — E669 Obesity, unspecified: Secondary | ICD-10-CM | POA: Diagnosis not present

## 2019-09-02 MED ORDER — SAXENDA 18 MG/3ML ~~LOC~~ SOPN: 3.0000 mg | PEN_INJECTOR | Freq: Every day | SUBCUTANEOUS | 0 refills | Status: DC

## 2019-09-02 NOTE — Telephone Encounter (Signed)
Please advise 

## 2019-09-03 NOTE — Progress Notes (Signed)
Chief Complaint:   OBESITY Mayo Ao is here to discuss her progress with her obesity treatment plan along with follow-up of her obesity related diagnoses. Ginni is on the Category 3 Plan and states she is following her eating plan approximately 55-60% of the time. Karina states she is exercising 0 minutes 0 times per week.  Today's visit was #: 18 Starting weight: 205 lbs Starting date: 06/13/2018 Today's weight: 182 lbs Today's date: 09/02/2019 Total lbs lost to date: 23 Total lbs lost since last in-office visit: 0  Interim History: Jumana states that she ran out of [name] 2 week ago and wanted to see how she would do off of it. She reports that she has been journaling.  Subjective:   Insulin resistance. Teague has a diagnosis of insulin resistance based on her elevated fasting insulin level >5. She continues to work on diet and exercise to decrease her risk of diabetes. She endorses polyphagia. She has been off of [place] for 2 weeks.  Lab Results  Component Value Date   INSULIN 9.1 06/17/2019   INSULIN 8.9 06/13/2018   Lab Results  Component Value Date   HGBA1C 4.9 06/17/2019   At risk for diabetes mellitus. Norielle is at higher than average risk for developing diabetes due to her obesity.   Assessment/Plan:   Insulin resistance. Corrie will continue to work on weight loss, exercise, and decreasing simple carbohydrates to help decrease the risk of diabetes. Amarea agreed to follow-up with [name] as directed to closely monitor her progress. She will restart Saxenda and eat more protein.  At risk for diabetes mellitus. Akirah was given approximately 15 minutes of diabetes education and counseling today. We discussed intensive lifestyle modifications today with an emphasis on weight loss as well as increasing exercise and decreasing simple carbohydrates in her diet. We also reviewed medication options with an emphasis on risk versus benefit of those discussed.    Repetitive spaced learning was employed today to elicit superior memory formation and behavioral change.  Class 1 obesity with serious comorbidity and body mass index (BMI) of 30.0 to 30.9 in adult, unspecified obesity type - BMI greater than 30 at start of program. Refill was given for Liraglutide - Weight Management (SAXENDA) 18 MG/3ML SOPN #5 with 0 refills.  Khadeeja is currently in the action stage of change. As such, her goal is to continue with weight loss efforts. She has agreed to the Category 3 Plan.   Exercise goals: For substantial health benefits, adults should do at least 150 minutes (2 hours and 30 minutes) a week of moderate-intensity, or 75 minutes (1 hour and 15 minutes) a week of vigorous-intensity aerobic physical activity, or an equivalent combination of moderate- and vigorous-intensity aerobic activity. Aerobic activity should be performed in episodes of at least 10 minutes, and preferably, it should be spread throughout the week.  Behavioral modification strategies: increasing lean protein intake and keeping healthy foods in the home.  Eliabeth has agreed to follow-up with our clinic in 2 weeks. She was informed of the importance of frequent follow-up visits to maximize her success with intensive lifestyle modifications for her multiple health conditions.   Objective:   Blood pressure 115/76, pulse 67, temperature 98.4 F (36.9 C), temperature source Oral, height 5\' 6"  (1.676 m), weight 182 lb (82.6 kg), last menstrual period 08/04/2019, SpO2 100 %. Body mass index is 29.38 kg/m.  General: Cooperative, alert, well developed, in no acute distress. HEENT: Conjunctivae and lids unremarkable. Cardiovascular: Regular  rhythm.  Lungs: Normal work of breathing. Neurologic: No focal deficits.   Lab Results  Component Value Date   CREATININE 1.02 (H) 07/11/2019   BUN 12 07/11/2019   NA 138 07/11/2019   K 3.5 07/11/2019   CL 105 07/11/2019   CO2 25 07/11/2019   Lab  Results  Component Value Date   ALT 13 06/17/2019   AST 16 06/17/2019   ALKPHOS 59 06/17/2019   BILITOT 0.4 06/17/2019   Lab Results  Component Value Date   HGBA1C 4.9 06/17/2019   HGBA1C 5.0 05/27/2018   HGBA1C 5.1 05/22/2017   HGBA1C 5.1 05/16/2016   HGBA1C 5.0 04/07/2015   Lab Results  Component Value Date   INSULIN 9.1 06/17/2019   INSULIN 8.9 06/13/2018   Lab Results  Component Value Date   TSH 2.250 06/13/2018   Lab Results  Component Value Date   CHOL 109 06/17/2019   HDL 49 06/17/2019   LDLCALC 46 06/17/2019   TRIG 64 06/17/2019   CHOLHDL 2 05/27/2018   Lab Results  Component Value Date   WBC 8.6 07/11/2019   HGB 14.5 07/11/2019   HCT 44.2 07/11/2019   MCV 92.5 07/11/2019   PLT 258 07/11/2019   Lab Results  Component Value Date   IRON 80 05/19/2019   TIBC 390 05/19/2019   FERRITIN 75 05/19/2019   Attestation Statements:   Reviewed by clinician on day of visit: allergies, medications, problem list, medical history, surgical history, family history, social history, and previous encounter notes.  IMichaelene Song, am acting as transcriptionist for Abby Potash, PA-C   I have reviewed the above documentation for accuracy and completeness, and I agree with the above. Abby Potash, PA-C

## 2019-09-08 ENCOUNTER — Encounter: Payer: Self-pay | Admitting: Family

## 2019-09-08 ENCOUNTER — Inpatient Hospital Stay: Payer: BC Managed Care – PPO | Attending: Family

## 2019-09-08 ENCOUNTER — Other Ambulatory Visit: Payer: Self-pay

## 2019-09-08 ENCOUNTER — Inpatient Hospital Stay (HOSPITAL_BASED_OUTPATIENT_CLINIC_OR_DEPARTMENT_OTHER): Payer: BC Managed Care – PPO | Admitting: Family

## 2019-09-08 ENCOUNTER — Telehealth: Payer: Self-pay | Admitting: *Deleted

## 2019-09-08 ENCOUNTER — Telehealth: Payer: Self-pay | Admitting: Family

## 2019-09-08 VITALS — BP 114/47 | HR 84 | Temp 97.3°F | Resp 16 | Wt 185.0 lb

## 2019-09-08 DIAGNOSIS — D500 Iron deficiency anemia secondary to blood loss (chronic): Secondary | ICD-10-CM

## 2019-09-08 DIAGNOSIS — K5900 Constipation, unspecified: Secondary | ICD-10-CM | POA: Insufficient documentation

## 2019-09-08 DIAGNOSIS — N920 Excessive and frequent menstruation with regular cycle: Secondary | ICD-10-CM | POA: Diagnosis not present

## 2019-09-08 LAB — RETICULOCYTES
Immature Retic Fract: 8.6 % (ref 2.3–15.9)
RBC.: 4.48 MIL/uL (ref 3.87–5.11)
Retic Count, Absolute: 98.1 10*3/uL (ref 19.0–186.0)
Retic Ct Pct: 2.2 % (ref 0.4–3.1)

## 2019-09-08 LAB — CBC WITH DIFFERENTIAL (CANCER CENTER ONLY)
Abs Immature Granulocytes: 0.22 10*3/uL — ABNORMAL HIGH (ref 0.00–0.07)
Basophils Absolute: 0.1 10*3/uL (ref 0.0–0.1)
Basophils Relative: 0 %
Eosinophils Absolute: 0.3 10*3/uL (ref 0.0–0.5)
Eosinophils Relative: 2 %
HCT: 42.6 % (ref 36.0–46.0)
Hemoglobin: 13.9 g/dL (ref 12.0–15.0)
Immature Granulocytes: 2 %
Lymphocytes Relative: 9 %
Lymphs Abs: 1.2 10*3/uL (ref 0.7–4.0)
MCH: 30.2 pg (ref 26.0–34.0)
MCHC: 32.6 g/dL (ref 30.0–36.0)
MCV: 92.6 fL (ref 80.0–100.0)
Monocytes Absolute: 0.8 10*3/uL (ref 0.1–1.0)
Monocytes Relative: 6 %
Neutro Abs: 11.2 10*3/uL — ABNORMAL HIGH (ref 1.7–7.7)
Neutrophils Relative %: 81 %
Platelet Count: 209 10*3/uL (ref 150–400)
RBC: 4.6 MIL/uL (ref 3.87–5.11)
RDW: 13.1 % (ref 11.5–15.5)
WBC Count: 13.7 10*3/uL — ABNORMAL HIGH (ref 4.0–10.5)
nRBC: 0 % (ref 0.0–0.2)

## 2019-09-08 LAB — IRON AND TIBC
Iron: 110 ug/dL (ref 41–142)
Saturation Ratios: 30 % (ref 21–57)
TIBC: 368 ug/dL (ref 236–444)
UIBC: 257 ug/dL (ref 120–384)

## 2019-09-08 LAB — FERRITIN: Ferritin: 237 ng/mL (ref 11–307)

## 2019-09-08 NOTE — Telephone Encounter (Signed)
Patient notified per order of S. Homer NP that no IV iron is needed at this time and that appt on Friday will be canceled.  Pt appreciative of call and has no questions or concerns at this time.

## 2019-09-08 NOTE — Telephone Encounter (Signed)
-----   Message from Eliezer Bottom, NP sent at 09/08/2019  1:49 PM EDT ----- No Iv iron needed. Let tonya know to cancel her appointment please. Thank you!  ----- Message ----- From: Interface, Lab In [place] Sent: 09/08/2019   8:31 AM EDT To: Eliezer Bottom, NP

## 2019-09-08 NOTE — Progress Notes (Signed)
Hematology and Oncology Follow Up Visit  Tamara Ball [id number] 1980-05-25 39 y.o. 09/08/2019   Principle Diagnosis:  Iron deficiency anemia secondary to menorrhagia  Current Therapy:        IV iron as indicated    Interim History:  Tamara Ball is here today for follow-up. She had issues with her birthcontrol and states that she had a heavy cycles for 2 months. She has finally gotten her Aviane regulated and has stopped spotting.  No other blood loss noted. No petechiae.  She is symptomatic with fatigue, worsening headaches, intermittent tingling in her legs and feet and occasional SOB. She has also noted she is bruising a little more easily.  No fever, chills, n/v, cough, rash, dizziness, chest pain, palpitations, abdominal pain or changes in bowel or bladder habits.  She still has issues with constipation.  No swelling or tenderness in her extremities at this time.  No falls or syncope.  She has maintained a good appetite and doing her best to stay well hydrated.  Her weight is stable.   ECOG Performance Status: 1 - Symptomatic but completely ambulatory  Medications:  Allergies as of 09/08/2019   No Known Allergies     Medication List       Accurate as of Sep 08, 2019  8:44 AM. If you have any questions, ask your nurse or doctor.        Aviane 0.1-20 MG-MCG tablet Generic drug: levonorgestrel-ethinyl estradiol   b complex vitamins tablet Take 1 tablet by mouth daily.   baclofen 10 MG tablet Commonly known as: LIORESAL Take 10 mg by mouth as needed for muscle spasms.   BD Pen Needle Nano 2nd Gen 32G X 4 MM Misc Generic drug: Insulin Pen Needle 1 Package by Does not apply route 2 (two) times daily.   Biotin 10000 MCG Tabs Take 1 tablet by mouth daily.   Dymista 137-50 MCG/ACT Susp Generic drug: Azelastine-Fluticasone 1 actuation each nostril twice daily   Fish Oil 1000 MG Caps Take 1 capsule by mouth daily.   hydrocortisone 2.5 % rectal cream Commonly known  as: Anusol-HC Place 1 application rectally 2 (two) times daily.   levocetirizine 5 MG tablet Commonly known as: XYZAL Take 1 tablet (5 mg total) by mouth every evening.   megestrol 40 MG tablet Commonly known as: MEGACE Take 3 pills once daily for 5 days, then 2 pills daily for 5 days, followed by 1 pill daily for 5 days.   montelukast 10 MG tablet Commonly known as: SINGULAIR Take 1 tablet (10 mg total) by mouth at bedtime.   multivitamin with minerals Tabs tablet Take 1 tablet by mouth daily.   ondansetron 4 MG tablet Commonly known as: Zofran Take 1 tablet (4 mg total) by mouth every 8 (eight) hours as needed for nausea or vomiting.   polyethylene glycol 17 g packet Commonly known as: MIRALAX / GLYCOLAX Take 17 g by mouth daily.   rizatriptan 10 MG tablet Commonly known as: MAXALT Take 10 mg by mouth as needed for migraine. May repeat in 2 hours if needed   Saxenda 18 MG/3ML Sopn Generic drug: Liraglutide -Weight Management Inject 0.5 mLs (3 mg total) into the skin daily.   tiZANidine 4 MG tablet Commonly known as: ZANAFLEX Take 4 mg by mouth daily as needed.   zonisamide 25 MG capsule Commonly known as: ZONEGRAN Take 25 mg by mouth daily.   zonisamide 50 MG capsule Commonly known as: ZONEGRAN Take 150 mg by mouth  daily.       Allergies: No Known Allergies  Past Medical History, Surgical history, Social history, and Family History were reviewed and updated.  Review of Systems: All other 10 point review of systems is negative.   Physical Exam:  weight is 185 lb (83.9 kg). Her temporal temperature is 97.3 F (36.3 C) (abnormal). Her blood pressure is 114/47 (abnormal) and her pulse is 84. Her respiration is 16 and oxygen saturation is 100%.   Wt Readings from Last 3 Encounters:  09/08/19 185 lb (83.9 kg)  09/02/19 182 lb (82.6 kg)  07/21/19 173 lb (78.5 kg)    Ocular: Sclerae unicteric, pupils equal, round and reactive to light Ear-nose-throat:  Oropharynx clear, dentition fair Lymphatic: No cervical or supraclavicular adenopathy Lungs no rales or rhonchi, good excursion bilaterally Heart regular rate and rhythm, no murmur appreciated Abd soft, nontender, positive bowel sounds, no liver or spleen tip palpated on exam, no fluid wave  MSK no focal spinal tenderness, no joint edema Neuro: non-focal, well-oriented, appropriate affect Breasts: Deferred   Lab Results  Component Value Date   WBC 13.7 (H) 09/08/2019   HGB 13.9 09/08/2019   HCT 42.6 09/08/2019   MCV 92.6 09/08/2019   PLT 209 09/08/2019   Lab Results  Component Value Date   FERRITIN 75 05/19/2019   IRON 80 05/19/2019   TIBC 390 05/19/2019   UIBC 310 05/19/2019   IRONPCTSAT 20 (L) 05/19/2019   Lab Results  Component Value Date   RETICCTPCT 2.2 09/08/2019   RBC 4.60 09/08/2019   RBC 4.48 09/08/2019   RETICCTABS 64,400 05/16/2016   No results found for: KPAFRELGTCHN, LAMBDASER, KAPLAMBRATIO No results found for: IGGSERUM, IGA, IGMSERUM No results found for: Odetta Pink, SPEI   Chemistry      Component Value Date/Time   NA 138 07/11/2019 1716   NA 139 06/17/2019 1101   K 3.5 07/11/2019 1716   K 4.2 08/11/2016 1404   CL 105 07/11/2019 1716   CL 103 08/11/2016 1404   CO2 25 07/11/2019 1716   CO2 26 08/11/2016 1404   BUN 12 07/11/2019 1716   BUN 10 06/17/2019 1101   CREATININE 1.02 (H) 07/11/2019 1716   CREATININE 0.87 08/11/2016 1404      Component Value Date/Time   CALCIUM 9.3 07/11/2019 1716   CALCIUM 9.5 08/11/2016 1404   ALKPHOS 59 06/17/2019 1101   ALKPHOS 58 08/11/2016 1404   AST 16 06/17/2019 1101   AST 9 08/11/2016 1404   ALT 13 06/17/2019 1101   ALT 8 08/11/2016 1404   BILITOT 0.4 06/17/2019 1101       Impression and Plan: Tamara Ball is a very pleasant 39 yo African American female with iron deficiency secondary to heavy cycles. We will see what her iron studies look like and  replace if needed.  We will plan to see her back in another 3 months.  She will contact our office with any questions or concerns. We can certainly see her sooner if needed.   [name], NP 5/3/20218:44 AM

## 2019-09-08 NOTE — Telephone Encounter (Signed)
Appointments scheduled patient is aware and has My Chart Access per 5/3 los

## 2019-09-12 ENCOUNTER — Ambulatory Visit: Payer: BC Managed Care – PPO

## 2019-09-16 ENCOUNTER — Other Ambulatory Visit: Payer: BC Managed Care – PPO

## 2019-09-16 ENCOUNTER — Ambulatory Visit: Payer: BC Managed Care – PPO | Admitting: Family

## 2019-09-17 ENCOUNTER — Ambulatory Visit: Payer: BC Managed Care – PPO | Admitting: Family Medicine

## 2019-09-17 ENCOUNTER — Other Ambulatory Visit: Payer: Self-pay

## 2019-09-17 ENCOUNTER — Encounter: Payer: Self-pay | Admitting: Family Medicine

## 2019-09-17 VITALS — BP 102/64 | HR 83 | Temp 97.7°F | Ht 66.0 in | Wt 186.7 lb

## 2019-09-17 DIAGNOSIS — G4700 Insomnia, unspecified: Secondary | ICD-10-CM

## 2019-09-17 DIAGNOSIS — N920 Excessive and frequent menstruation with regular cycle: Secondary | ICD-10-CM | POA: Diagnosis not present

## 2019-09-17 DIAGNOSIS — G43809 Other migraine, not intractable, without status migrainosus: Secondary | ICD-10-CM

## 2019-09-17 MED ORDER — LEVOCETIRIZINE DIHYDROCHLORIDE 5 MG PO TABS: 5.0000 mg | ORAL_TABLET | Freq: Every evening | ORAL | 1 refills | Status: DC

## 2019-09-17 MED ORDER — MONTELUKAST SODIUM 10 MG PO TABS: 10.0000 mg | ORAL_TABLET | Freq: Every day | ORAL | 1 refills | Status: DC

## 2019-09-17 NOTE — Progress Notes (Signed)
Mayo Ao DOB: August 03, 1980 Encounter date: 09/17/2019  This isa 39 y.o. female who presents to establish care. Chief Complaint  Patient presents with  . Establish Care  . Hypotension  . Medication Refill    History of present illness:  On last Monday at hematologist bp was low. Hemoglobin was low, but iron was low. Diastolic wasn't low when she checked over several days - bottom between [id number] but systolic was 0000000. States now that she had a migraine that day; felt "like crap".   Feels like she is thirsty all the time. If she drinks anything it is water. Some days just thirsty no matter how much she drinks.   Sees gyn: Dr. Garwin Brothers. Has several fibroids. Has [name] q 6 months. She really doesn't want to have surgery and that is the only option for her at this point.   Migraines intensify week before and week of cycle. Cramping with menses. Has 7 day cycle - 2 days heavy. [facility] Has done trigger points; didn't feel like this worked. Taking zonegran daily (sometimes forgets); abortants: don't work/also make her drowsy. maxalt not helpful. zanaflex not helpful. Baclofen not helpful.  excedrin works best for; not taken away, but does allow her to function.   Staying asleep is issue for her. Tossing and turning. Has sleep consult May 20th.   Sees hematology for iron def anemia In 2020 working with [facility]: on [facility]  Constipation: still an issue. When she remembers she takes miralax.   Past Medical History:  Diagnosis Date  . Anemia   . Anxiety   . Chicken pox   . Chronic headaches   . Constipation   . Fibroids   . Iron deficiency anemia due to chronic blood loss    from heavy periods  . Menorrhagia   . Palpitations    History reviewed. No pertinent surgical history. No Known Allergies Current Meds  Medication Sig  . AVIANE 0.1-20 MG-MCG tablet   . b complex vitamins tablet Take 1 tablet by mouth daily.  . baclofen (LIORESAL)  10 MG tablet Take 10 mg by mouth as needed for muscle spasms.  . Biotin 10000 MCG TABS Take 1 tablet by mouth daily.  Marland Kitchen DYMISTA 137-50 MCG/ACT SUSP 1 actuation each nostril twice daily  . hydrocortisone (ANUSOL-HC) 2.5 % rectal cream Place 1 application rectally 2 (two) times daily.  . Insulin Pen Needle (BD PEN NEEDLE NANO 2ND GEN) 32G X 4 MM MISC 1 Package by Does not apply route 2 (two) times daily.  Marland Kitchen levocetirizine (XYZAL) 5 MG tablet Take 1 tablet (5 mg total) by mouth every evening.  . Liraglutide -Weight Management (SAXENDA) 18 MG/3ML SOPN Inject 0.5 mLs (3 mg total) into the skin daily.  . megestrol (MEGACE) 40 MG tablet Take 3 pills once daily for 5 days, then 2 pills daily for 5 days, followed by 1 pill daily for 5 days.  . montelukast (SINGULAIR) 10 MG tablet Take 1 tablet (10 mg total) by mouth at bedtime.  . Multiple Vitamin (MULTIVITAMIN WITH MINERALS) TABS tablet Take 1 tablet by mouth daily.  . Omega-3 Fatty Acids (FISH OIL) 1000 MG CAPS Take 1 capsule by mouth daily.  . ondansetron (ZOFRAN) 4 MG tablet Take 1 tablet (4 mg total) by mouth every 8 (eight) hours as needed for nausea or vomiting.  . polyethylene glycol (MIRALAX / GLYCOLAX) 17 g packet Take 17 g by mouth daily.  Marland Kitchen zonisamide (ZONEGRAN) 50 MG  capsule Take 150 mg by mouth daily.  . [DISCONTINUED] levocetirizine (XYZAL) 5 MG tablet Take 1 tablet (5 mg total) by mouth every evening.  . [DISCONTINUED] montelukast (SINGULAIR) 10 MG tablet Take 1 tablet (10 mg total) by mouth at bedtime.  . [DISCONTINUED] rizatriptan (MAXALT) 10 MG tablet Take 10 mg by mouth as needed for migraine. May repeat in 2 hours if needed  . [DISCONTINUED] tiZANidine (ZANAFLEX) 4 MG tablet Take 4 mg by mouth daily as needed.  . [DISCONTINUED] zonisamide (ZONEGRAN) 25 MG capsule Take 25 mg by mouth daily.   Social History   Tobacco Use  . Smoking status: Never Smoker  . Smokeless tobacco: Never Used  Substance Use Topics  . Alcohol use: Yes     Alcohol/week: 0.0 standard drinks    Comment: social   Family History  Problem Relation Age of Onset  . Hypertension Mother   . Cancer Mother        skin cancer ?type but required radiation  . Healthy Maternal Grandmother   . Diabetes Maternal Grandfather   . Glaucoma Maternal Grandfather   . Hypertension Maternal Grandfather   . Hyperlipidemia Maternal Grandfather   . Gout Maternal Grandfather   . Hypertension Maternal Uncle   . Colon polyps Neg Hx   . Colon cancer Neg Hx   . Esophageal cancer Neg Hx      Review of Systems  Constitutional: Negative for chills, fatigue and fever.  Respiratory: Negative for cough, chest tightness, shortness of breath and wheezing.   Cardiovascular: Negative for chest pain, palpitations and leg swelling.  Genitourinary: Menstrual problem: menorrhagia.  Neurological: Positive for headaches.    Objective:  BP 102/64 (BP Location: Left Arm, Patient Position: Sitting, Cuff Size: Large)   Pulse 83   Temp 97.7 F (36.5 C) (Temporal)   Ht 5\' 6"  (1.676 m)   Wt 186 lb 11.2 oz (84.7 kg)   LMP 09/05/2019 (Approximate)   SpO2 97%   BMI 30.13 kg/m   Weight: 186 lb 11.2 oz (84.7 kg)   BP Readings from Last 3 Encounters:  09/17/19 102/64  09/08/19 (!) 114/47  09/02/19 115/76   Wt Readings from Last 3 Encounters:  09/17/19 186 lb 11.2 oz (84.7 kg)  09/08/19 185 lb (83.9 kg)  09/02/19 182 lb (82.6 kg)    Physical Exam Constitutional:      General: She is not in acute distress.    Appearance: She is well-developed.  Cardiovascular:     Rate and Rhythm: Normal rate and regular rhythm.     Heart sounds: Normal heart sounds. No murmur. No friction rub.  Pulmonary:     Effort: Pulmonary effort is normal. No respiratory distress.     Breath sounds: Normal breath sounds. No wheezing or rales.  Musculoskeletal:     Right lower leg: No edema.     Left lower leg: No edema.  Neurological:     Mental Status: She is alert and oriented to person,  place, and time.  Psychiatric:        Behavior: Behavior normal.     Assessment/Plan:  1. Menorrhagia with regular cycle Following with gynecology  2. Other migraine without status migrainosus, not intractable Following with headache clinic. Encouraged her to discuss alternative treatment options since she has not noted improvemenet with medications she has tried thus far. Discussed B2 prevention as well. If sleep is abnormal this is likely contributing so further treatment will pend on sleep eval.   3. Insomnia Has  sleep study coming up. I will review notes from this and get back in touch with her regarding followup pending this report.  Return for pending specialty note review.  Micheline Rough, MD

## 2019-09-17 NOTE — Patient Instructions (Signed)
*  consider vitamin B2 400mg  daily.   *would ubrelvy or nurtec be options? How about Aimovig? How about effexor?

## 2019-09-19 DIAGNOSIS — G43909 Migraine, unspecified, not intractable, without status migrainosus: Secondary | ICD-10-CM | POA: Insufficient documentation

## 2019-09-22 ENCOUNTER — Encounter (INDEPENDENT_AMBULATORY_CARE_PROVIDER_SITE_OTHER): Payer: Self-pay | Admitting: Bariatrics

## 2019-09-22 ENCOUNTER — Ambulatory Visit (INDEPENDENT_AMBULATORY_CARE_PROVIDER_SITE_OTHER): Payer: BC Managed Care – PPO | Admitting: Bariatrics

## 2019-09-22 ENCOUNTER — Other Ambulatory Visit: Payer: Self-pay

## 2019-09-22 VITALS — BP 108/71 | HR 79 | Temp 98.7°F | Ht 66.0 in | Wt 179.0 lb

## 2019-09-22 DIAGNOSIS — Z6830 Body mass index (BMI) 30.0-30.9, adult: Secondary | ICD-10-CM | POA: Diagnosis not present

## 2019-09-22 DIAGNOSIS — E8881 Metabolic syndrome: Secondary | ICD-10-CM

## 2019-09-22 DIAGNOSIS — G43909 Migraine, unspecified, not intractable, without status migrainosus: Secondary | ICD-10-CM | POA: Diagnosis not present

## 2019-09-22 DIAGNOSIS — E669 Obesity, unspecified: Secondary | ICD-10-CM | POA: Diagnosis not present

## 2019-09-22 MED ORDER — SAXENDA 18 MG/3ML ~~LOC~~ SOPN: 3.0000 mg | PEN_INJECTOR | Freq: Every day | SUBCUTANEOUS | 0 refills | Status: DC

## 2019-09-23 ENCOUNTER — Encounter (INDEPENDENT_AMBULATORY_CARE_PROVIDER_SITE_OTHER): Payer: Self-pay | Admitting: Bariatrics

## 2019-09-23 NOTE — Progress Notes (Signed)
Chief Complaint:   OBESITY Mayo Ao is here to discuss her progress with her obesity treatment plan along with follow-up of her obesity related diagnoses. Ilma is on the Category 3 Plan and states she is following her eating plan approximately 70% of the time. Hazzel states she is doing HIIT 30 minutes 5 times per week.  Today's visit was #: 61 Starting weight: 205 lbs Starting date: 06/13/2018 Today's weight: 179 lbs Today's date: 09/22/2019 Total lbs lost to date: 26 Total lbs lost since last in-office visit: 3  Interim History: Danitza is down 3 lbs and doing well overall. She is taking Saxenda at the maximum dose.  Subjective:   Insulin resistance. Levenia has a diagnosis of insulin resistance based on her elevated fasting insulin level >5. She continues to work on diet and exercise to decrease her risk of diabetes. She is on no medication.  Lab Results  Component Value Date   INSULIN 9.1 06/17/2019   INSULIN 8.9 06/13/2018   Lab Results  Component Value Date   HGBA1C 4.9 06/17/2019   Migraine without status migrainosus, not intractable, unspecified migraine type. See medication list.  Assessment/Plan:   Insulin resistance. Darly will continue to work on weight loss, exercise, increasing healthy fats and protein, and decreasing simple carbohydrates to help decrease the risk of diabetes. Annikka agreed to follow-up with [name] as directed to closely monitor her progress.  Migraine without status migrainosus, not intractable, unspecified migraine type. Sarahrose will follow-up with her PCP or neurologist as scheduled. She was informed that migraines can improve with weight loss.  Class 1 obesity with serious comorbidity and body mass index (BMI) of 30.0 to 30.9 in adult, unspecified obesity type - BMI greater than 30 at start of program. Prescription was given for Liraglutide - Weight Management (SAXENDA) 18 MG/3ML SOPN inject 0.5 mL (3 mg total) into skin daily, 1 month  supply with 0 refills.  Nisi is currently in the action stage of change. As such, her goal is to continue with weight loss efforts. She has agreed to the Category 3 Plan.   She will work on meal planning, intentional eating, and increasing her protein intake.  Exercise goals: Atia started working out more and doing more HIIT.  Behavioral modification strategies: increasing lean protein intake, decreasing simple carbohydrates, increasing vegetables, increasing water intake, decreasing eating out, no skipping meals, meal planning and cooking strategies and keeping healthy foods in the home.  Tiann has agreed to follow-up with our clinic in 2 weeks. She was informed of the importance of frequent follow-up visits to maximize her success with intensive lifestyle modifications for her multiple health conditions.   Objective:   Blood pressure 108/71, pulse 79, temperature 98.7 F (37.1 C), height 5\' 6"  (1.676 m), weight 179 lb (81.2 kg), last menstrual period 09/05/2019, SpO2 100 %. Body mass index is 28.89 kg/m.  General: Cooperative, alert, well developed, in no acute distress. HEENT: Conjunctivae and lids unremarkable. Cardiovascular: Regular rhythm.  Lungs: Normal work of breathing. Neurologic: No focal deficits.   Lab Results  Component Value Date   CREATININE 1.02 (H) 07/11/2019   BUN 12 07/11/2019   NA 138 07/11/2019   K 3.5 07/11/2019   CL 105 07/11/2019   CO2 25 07/11/2019   Lab Results  Component Value Date   ALT 13 06/17/2019   AST 16 06/17/2019   ALKPHOS 59 06/17/2019   BILITOT 0.4 06/17/2019   Lab Results  Component Value Date  HGBA1C 4.9 06/17/2019   HGBA1C 5.0 05/27/2018   HGBA1C 5.1 05/22/2017   HGBA1C 5.1 05/16/2016   HGBA1C 5.0 04/07/2015   Lab Results  Component Value Date   INSULIN 9.1 06/17/2019   INSULIN 8.9 06/13/2018   Lab Results  Component Value Date   TSH 2.250 06/13/2018   Lab Results  Component Value Date   CHOL 109 06/17/2019    HDL 49 06/17/2019   LDLCALC 46 06/17/2019   TRIG 64 06/17/2019   CHOLHDL 2 05/27/2018   Lab Results  Component Value Date   WBC 13.7 (H) 09/08/2019   HGB 13.9 09/08/2019   HCT 42.6 09/08/2019   MCV 92.6 09/08/2019   PLT 209 09/08/2019   Lab Results  Component Value Date   IRON 110 09/08/2019   TIBC 368 09/08/2019   FERRITIN 237 09/08/2019   Attestation Statements:   Reviewed by clinician on day of visit: allergies, medications, problem list, medical history, surgical history, family history, social history, and previous encounter notes.  Migdalia Dk, am acting as Location manager for [employer], DO   I have reviewed the above documentation for accuracy and completeness, and I agree with the above. Jearld Lesch, DO

## 2019-09-25 ENCOUNTER — Encounter: Payer: Self-pay | Admitting: Pulmonary Disease

## 2019-09-25 ENCOUNTER — Ambulatory Visit: Payer: BC Managed Care – PPO | Admitting: Pulmonary Disease

## 2019-09-25 ENCOUNTER — Other Ambulatory Visit: Payer: Self-pay

## 2019-09-25 VITALS — BP 104/66 | HR 74 | Temp 98.4°F | Ht 66.0 in | Wt 188.0 lb

## 2019-09-25 DIAGNOSIS — G4733 Obstructive sleep apnea (adult) (pediatric): Secondary | ICD-10-CM | POA: Diagnosis not present

## 2019-09-25 MED ORDER — ESZOPICLONE 1 MG PO TABS: 2.0000 mg | ORAL_TABLET | Freq: Every evening | ORAL | 1 refills | Status: DC | PRN

## 2019-09-25 NOTE — Progress Notes (Signed)
Tamara Ball    [id number]    [birth year]  Primary Care Physician:Koberlein, Steele Berg, MD  Referring Physician: Orie Rout, [address] [place],  [place] [address]  Chief complaint:   Patient with chronic migraines Trouble staying asleep  HPI:  Chronic migraines since teenage years Trouble staying asleep in the last 4 to 5 years No significant antecedent history to difficulty staying asleep No family history of difficulty staying asleep  Usually goes to bed between 9 and 11 PM Falls asleep easily , Sometimes through the night Final wake up time between 6 AM and 7 AM  No significant history of snoring No dryness of the mouth in the morning Occasional night sweats  Mom does snore  Does not usually have any difficulty falling asleep but describes her self as a very light sleeper Will wake up easily to any noise, Difficulty going back to sleep sometimes  Never smoker  Outpatient Encounter Medications as of 09/25/2019  Medication Sig  . AVIANE 0.1-20 MG-MCG tablet   . b complex vitamins tablet Take 1 tablet by mouth daily.  . baclofen (LIORESAL) 10 MG tablet Take 10 mg by mouth as needed for muscle spasms.  . Biotin 10000 MCG TABS Take 1 tablet by mouth daily.  Marland Kitchen DYMISTA 137-50 MCG/ACT SUSP 1 actuation each nostril twice daily  . hydrocortisone (ANUSOL-HC) 2.5 % rectal cream Place 1 application rectally 2 (two) times daily.  . Insulin Pen Needle (BD PEN NEEDLE NANO 2ND GEN) 32G X 4 MM MISC 1 Package by Does not apply route 2 (two) times daily.  Marland Kitchen levocetirizine (XYZAL) 5 MG tablet Take 1 tablet (5 mg total) by mouth every evening.  . Liraglutide -Weight Management (SAXENDA) 18 MG/3ML SOPN Inject 0.5 mLs (3 mg total) into the skin daily.  . montelukast (SINGULAIR) 10 MG tablet Take 1 tablet (10 mg total) by mouth at bedtime.  . Multiple Vitamin (MULTIVITAMIN WITH MINERALS) TABS tablet Take 1 tablet by mouth daily.  . Omega-3 Fatty Acids (FISH OIL)  1000 MG CAPS Take 1 capsule by mouth daily.  . ondansetron (ZOFRAN) 4 MG tablet Take 1 tablet (4 mg total) by mouth every 8 (eight) hours as needed for nausea or vomiting.  . polyethylene glycol (MIRALAX / GLYCOLAX) 17 g packet Take 17 g by mouth daily.  Marland Kitchen zonisamide (ZONEGRAN) 50 MG capsule Take 150 mg by mouth daily.   No facility-administered encounter medications on file as of 09/25/2019.    Allergies as of 09/25/2019  . (No Known Allergies)    Past Medical History:  Diagnosis Date  . Anemia   . Anxiety   . Chicken pox   . Chronic headaches   . Constipation   . Fibroids   . Iron deficiency anemia due to chronic blood loss    from heavy periods  . Menorrhagia   . Palpitations     History reviewed. No pertinent surgical history.  Family History  Problem Relation Age of Onset  . Hypertension Mother   . Cancer Mother        skin cancer ?type but required radiation  . Healthy Maternal Grandmother   . Diabetes Maternal Grandfather   . Glaucoma Maternal Grandfather   . Hypertension Maternal Grandfather   . Hyperlipidemia Maternal Grandfather   . Gout Maternal Grandfather   . Hypertension Maternal Uncle   . Colon polyps Neg Hx   . Colon cancer Neg Hx   . Esophageal cancer  Neg Hx     Social History   Socioeconomic History  . Marital status: Single    Spouse name: Not on file  . Number of children: Not on file  . Years of education: Not on file  . Highest education level: Not on file  Occupational History  . Occupation: Scientist, physiological  Tobacco Use  . Smoking status: Never Smoker  . Smokeless tobacco: Never Used  Substance and Sexual Activity  . Alcohol use: Yes    Alcohol/week: 0.0 standard drinks    Comment: social  . Drug use: No  . Sexual activity: Not on file  Other Topics Concern  . Not on file  Social History Narrative   Work or School: higher education - Engineer, site Situation: lives with 41 yo      Spiritual Beliefs: Christian       Lifestyle: no regular exercise; diet is so so      Social Determinants of Radio broadcast assistant Strain:   . Difficulty of Paying Living Expenses:   Food Insecurity:   . Worried About Charity fundraiser in the Last Year:   . Arboriculturist in the Last Year:   Transportation Needs:   . Film/video editor (Medical):   Marland Kitchen Lack of Transportation (Non-Medical):   Physical Activity:   . Days of Exercise per Week:   . Minutes of Exercise per Session:   Stress:   . Feeling of Stress :   Social Connections:   . Frequency of Communication with Friends and Family:   . Frequency of Social Gatherings with Friends and Family:   . Attends Religious Services:   . Active Member of Clubs or Organizations:   . Attends Archivist Meetings:   Marland Kitchen Marital Status:   Intimate Partner Violence:   . Fear of Current or Ex-Partner:   . Emotionally Abused:   Marland Kitchen Physically Abused:   . Sexually Abused:     Review of Systems  Neurological: Positive for headaches.  Psychiatric/Behavioral: Positive for sleep disturbance.  All other systems reviewed and are negative.   Vitals:   09/25/19 1531  BP: 104/66  Pulse: 74  Temp: 98.4 F (36.9 C)  SpO2: 98%     Physical Exam  Constitutional: She appears well-developed and well-nourished.  HENT:  Head: Normocephalic.  Mallampati 2, crowded oropharynx  Eyes: Pupils are equal, round, and reactive to light. Right eye exhibits no discharge. Left eye exhibits no discharge.  Neck: No tracheal deviation present. No thyromegaly present.  Cardiovascular: Normal rate and regular rhythm.  Pulmonary/Chest: Effort normal and breath sounds normal. No respiratory distress. She has no wheezes. She has no rales. She exhibits no tenderness.  Neurological: She is alert.  Skin: Skin is warm.  Psychiatric: She has a normal mood and affect.    Results of the Epworth flowsheet 09/25/2019  Sitting and reading 1  Watching TV 1  Sitting, inactive in a  public place (e.g. a theatre or a meeting) 1  As a passenger in a car for an hour without a break 1  Lying down to rest in the afternoon when circumstances permit 1  Sitting and talking to someone 0  Sitting quietly after a lunch without alcohol 1  In a car, while stopped for a few minutes in traffic 1  Total score 7   Assessment:  Migraine headaches  Nonrestorative sleep  Multiple awakenings  Daytime sleepiness  Possibility of  sleep disordered breathing discussed as contributed to multiple awakenings, though she does not have a history of snoring  Plan/Recommendations: We will schedule for home sleep study  Empirically try Lunesta 1 to 2 mg nightly, use about 30 minutes to an hour before bedtime  Call with any significant concerns  Follow-up in 6 weeks   Sherrilyn Rist MD [facility] 09/25/2019, 3:35 PM  CC: Orie Rout, MD

## 2019-09-25 NOTE — Patient Instructions (Signed)
Chronic headaches Concern for sleep disordered breathing Nonrestorative sleep  Trial with Lunesta  Obtain home sleep study  Follow-up in about 6 to 8 weeks  Call with significant concerns

## 2019-10-07 ENCOUNTER — Telehealth: Payer: Self-pay | Admitting: Pulmonary Disease

## 2019-10-07 NOTE — Telephone Encounter (Signed)
PA request was received from (pharmacy): [facility] on [address] Phone: [phone]   Medication name and strength: eszopiclone 1mg  Ordering Provider: AO  Was PA started with CMM?: Yes If yes, please enter KEY: [id number] Medication tried and failed: N/A Covered Alternatives: N/A  PA sent to plan, time frame for approval / denial: Unable to determine determination time, will check back later. Information was sent to insurance company.  Routing to [place] for follow-up.

## 2019-10-13 ENCOUNTER — Other Ambulatory Visit: Payer: Self-pay

## 2019-10-13 ENCOUNTER — Encounter (INDEPENDENT_AMBULATORY_CARE_PROVIDER_SITE_OTHER): Payer: Self-pay | Admitting: Physician Assistant

## 2019-10-13 ENCOUNTER — Ambulatory Visit (INDEPENDENT_AMBULATORY_CARE_PROVIDER_SITE_OTHER): Payer: BC Managed Care – PPO | Admitting: Physician Assistant

## 2019-10-13 VITALS — BP 119/74 | HR 74 | Temp 97.9°F | Ht 66.0 in | Wt 184.0 lb

## 2019-10-13 DIAGNOSIS — E669 Obesity, unspecified: Secondary | ICD-10-CM

## 2019-10-13 DIAGNOSIS — E8881 Metabolic syndrome: Secondary | ICD-10-CM

## 2019-10-13 DIAGNOSIS — Z6830 Body mass index (BMI) 30.0-30.9, adult: Secondary | ICD-10-CM

## 2019-10-13 NOTE — Progress Notes (Signed)
Chief Complaint:   OBESITY Tamara Ball is here to discuss her progress with her obesity treatment plan along with follow-up of her obesity related diagnoses. Tamara Ball is on the Category 3 Plan and states she is following her eating plan approximately 75% of the time. Tamara Ball states she is doing HIIT 45 minutes 5 times per week.  Today's visit was #: 20 Starting weight: 205 lbs Starting date: 06/13/2018 Today's weight: 184 lbs Today's date: 10/13/2019 Total lbs lost to date: 21 Total lbs lost since last in-office visit: 0  Interim History: Tamara Ball reports that she was out-of-town the last week and ate out every meal. She skips breakfast due to not enjoying the choices. Tamara Ball is helping control her hunger.  Subjective:   Insulin resistance. Tamara Ball has a diagnosis of insulin resistance based on her elevated fasting insulin level >5. She continues to work on diet and exercise to decrease her risk of diabetes. Tamara Ball is on Saxenda. No polyphagia. No nausea, vomiting, or diarrhea.   Lab Results  Component Value Date   INSULIN 9.1 06/17/2019   INSULIN 8.9 06/13/2018   Lab Results  Component Value Date   HGBA1C 4.9 06/17/2019   Assessment/Plan:   Insulin resistance. Tamara Ball will continue to work on weight loss, exercise, and decreasing simple carbohydrates to help decrease the risk of diabetes. Tamara Ball agreed to follow-up with [name] as directed to closely monitor her progress. She will continue her medication as directed.  Class 1 obesity with serious comorbidity and body mass index (BMI) of 30.0 to 30.9 in adult, unspecified obesity type - starting BMI greater than 30.   Tamara Ball is currently in the action stage of change. As such, her goal is to continue with weight loss efforts. She has agreed to the Category 3 Plan.   Exercise goals: For substantial health benefits, adults should do at least 150 minutes (2 hours and 30 minutes) a week of moderate-intensity, or 75 minutes (1 hour and  15 minutes) a week of vigorous-intensity aerobic physical activity, or an equivalent combination of moderate- and vigorous-intensity aerobic activity. Aerobic activity should be performed in episodes of at least 10 minutes, and preferably, it should be spread throughout the week.  Behavioral modification strategies: meal planning and cooking strategies and keeping healthy foods in the home.  Tamara Ball has agreed to follow-up with our clinic in 2-3 weeks. She was informed of the importance of frequent follow-up visits to maximize her success with intensive lifestyle modifications for her multiple health conditions.   Objective:   Blood pressure 119/74, pulse 74, temperature 97.9 F (36.6 C), temperature source Oral, height 5\' 6"  (1.676 m), weight 184 lb (83.5 kg), SpO2 99 %. Body mass index is 29.7 kg/m.  General: Cooperative, alert, well developed, in no acute distress. HEENT: Conjunctivae and lids unremarkable. Cardiovascular: Regular rhythm.  Lungs: Normal work of breathing. Neurologic: No focal deficits.   Lab Results  Component Value Date   CREATININE 1.02 (H) 07/11/2019   BUN 12 07/11/2019   NA 138 07/11/2019   K 3.5 07/11/2019   CL 105 07/11/2019   CO2 25 07/11/2019   Lab Results  Component Value Date   ALT 13 06/17/2019   AST 16 06/17/2019   ALKPHOS 59 06/17/2019   BILITOT 0.4 06/17/2019   Lab Results  Component Value Date   HGBA1C 4.9 06/17/2019   HGBA1C 5.0 05/27/2018   HGBA1C 5.1 05/22/2017   HGBA1C 5.1 05/16/2016   HGBA1C 5.0 04/07/2015   Lab Results  Component Value Date   INSULIN 9.1 06/17/2019   INSULIN 8.9 06/13/2018   Lab Results  Component Value Date   TSH 2.250 06/13/2018   Lab Results  Component Value Date   CHOL 109 06/17/2019   HDL 49 06/17/2019   LDLCALC 46 06/17/2019   TRIG 64 06/17/2019   CHOLHDL 2 05/27/2018   Lab Results  Component Value Date   WBC 13.7 (H) 09/08/2019   HGB 13.9 09/08/2019   HCT 42.6 09/08/2019   MCV 92.6  09/08/2019   PLT 209 09/08/2019   Lab Results  Component Value Date   IRON 110 09/08/2019   TIBC 368 09/08/2019   FERRITIN 237 09/08/2019   Attestation Statements:   Reviewed by clinician on day of visit: allergies, medications, problem list, medical history, surgical history, family history, social history, and previous encounter notes.  Time spent on visit including pre-visit chart review and post-visit charting and care was 30 minutes.   IMichaelene Song, am acting as transcriptionist for Abby Potash, PA-C   I have reviewed the above documentation for accuracy and completeness, and I agree with the above. Abby Potash, PA-C

## 2019-10-16 NOTE — Telephone Encounter (Signed)
Routing for triage for follow up.

## 2019-10-16 NOTE — Telephone Encounter (Signed)
Per CMM-Eszopiclone 1mg  PA has been denied. Dr Ander Slade, do you want to appeal or change medication?  Lattie Corns Key: [id number] - PA Case ID: [id number] Need help? Call us at [phone]  Outcome  Denied on June 2  Your PA request has been denied. Additional information will be provided in the denial communication. (Message 1140) Your PA request has been denied. Additional information will be provided in the denial communication. (Message 1140)  Message routed to [facility] to follow up and Dr Ander Slade to advise

## 2019-10-17 NOTE — Telephone Encounter (Signed)
Dr. Ander Slade, please advise.

## 2019-10-22 ENCOUNTER — Other Ambulatory Visit: Payer: Self-pay | Admitting: Pulmonary Disease

## 2019-10-22 MED ORDER — ZOLPIDEM TARTRATE ER 6.25 MG PO TBCR: 6.2500 mg | EXTENDED_RELEASE_TABLET | Freq: Every evening | ORAL | 2 refills | Status: DC | PRN

## 2019-10-22 NOTE — Telephone Encounter (Signed)
Prescription for Ambien sent in  If this is also not covered then patient needs to find out from her insurance company what they will cover for insomnia

## 2019-10-22 NOTE — Telephone Encounter (Signed)
Attempted to call pt but unable to reach. Left message for her to return call. 

## 2019-10-28 ENCOUNTER — Encounter (INDEPENDENT_AMBULATORY_CARE_PROVIDER_SITE_OTHER): Payer: Self-pay | Admitting: Physician Assistant

## 2019-10-28 ENCOUNTER — Telehealth: Payer: Self-pay | Admitting: Pulmonary Disease

## 2019-10-28 NOTE — Telephone Encounter (Signed)
[facility]  received a request from your provider for coverage of Eszopiclone. The request was denied because: You have requested more than the maximum quantity allowed by your plan. Current plan approved Insomnia Agents Post Limit criteria cover up to: - 30 tablets per month of Lunesta (eszopiclone) You have been approved for the maximum quantity that your plan covers for a duration of 36 months. Your request for additional quantities of the requested drug and strength has been denied  Dr. Ander Slade Request for 60 tablets per month has been denied.  Please advise.

## 2019-10-29 NOTE — Telephone Encounter (Signed)
Patient was switched to Ambien

## 2019-10-31 ENCOUNTER — Ambulatory Visit: Payer: BC Managed Care – PPO

## 2019-10-31 ENCOUNTER — Other Ambulatory Visit: Payer: Self-pay

## 2019-10-31 DIAGNOSIS — G4710 Hypersomnia, unspecified: Secondary | ICD-10-CM | POA: Diagnosis not present

## 2019-10-31 DIAGNOSIS — G4733 Obstructive sleep apnea (adult) (pediatric): Secondary | ICD-10-CM

## 2019-11-03 ENCOUNTER — Ambulatory Visit (INDEPENDENT_AMBULATORY_CARE_PROVIDER_SITE_OTHER): Payer: BC Managed Care – PPO | Admitting: Physician Assistant

## 2019-11-03 ENCOUNTER — Ambulatory Visit: Payer: BC Managed Care – PPO | Admitting: Family Medicine

## 2019-11-06 ENCOUNTER — Encounter (INDEPENDENT_AMBULATORY_CARE_PROVIDER_SITE_OTHER): Payer: Self-pay

## 2019-11-06 ENCOUNTER — Other Ambulatory Visit (INDEPENDENT_AMBULATORY_CARE_PROVIDER_SITE_OTHER): Payer: Self-pay | Admitting: Bariatrics

## 2019-11-06 DIAGNOSIS — E669 Obesity, unspecified: Secondary | ICD-10-CM

## 2019-11-10 ENCOUNTER — Telehealth: Payer: Self-pay | Admitting: Pulmonary Disease

## 2019-11-10 DIAGNOSIS — G4710 Hypersomnia, unspecified: Secondary | ICD-10-CM

## 2019-11-10 NOTE — Telephone Encounter (Signed)
Call patient  Sleep study result  Date of study: 10/31/2019  Impression: Negative study for significant obstructive sleep apnea No significant oxygen desaturations  Recommendation: Clinical follow-up of symptoms  If there is significant clinical concern for sleep disordered breathing, will recommend an in lab polysomnogram however this negative study does suggest absence of moderate to severe disease  Encourage regular exercise and weight loss efforts  Caution against driving when sleepy  Sleep position modification, encouraging sleeping on lateral position, elevation of the head of the bed by about 30 degrees may help  Follow-up as scheduled

## 2019-11-11 ENCOUNTER — Other Ambulatory Visit: Payer: Self-pay

## 2019-11-11 ENCOUNTER — Other Ambulatory Visit: Payer: Self-pay | Admitting: Pulmonary Disease

## 2019-11-11 ENCOUNTER — Telehealth: Payer: Self-pay | Admitting: Pulmonary Disease

## 2019-11-11 ENCOUNTER — Ambulatory Visit (INDEPENDENT_AMBULATORY_CARE_PROVIDER_SITE_OTHER): Payer: BC Managed Care – PPO | Admitting: Physician Assistant

## 2019-11-11 ENCOUNTER — Encounter (INDEPENDENT_AMBULATORY_CARE_PROVIDER_SITE_OTHER): Payer: Self-pay | Admitting: Physician Assistant

## 2019-11-11 VITALS — BP 102/69 | HR 72 | Temp 98.2°F | Ht 66.0 in | Wt 185.0 lb

## 2019-11-11 DIAGNOSIS — E8881 Metabolic syndrome: Secondary | ICD-10-CM | POA: Diagnosis not present

## 2019-11-11 DIAGNOSIS — F3289 Other specified depressive episodes: Secondary | ICD-10-CM | POA: Diagnosis not present

## 2019-11-11 DIAGNOSIS — Z9189 Other specified personal risk factors, not elsewhere classified: Secondary | ICD-10-CM

## 2019-11-11 DIAGNOSIS — Z6830 Body mass index (BMI) 30.0-30.9, adult: Secondary | ICD-10-CM

## 2019-11-11 DIAGNOSIS — E669 Obesity, unspecified: Secondary | ICD-10-CM

## 2019-11-11 MED ORDER — ESZOPICLONE 1 MG PO TABS: 2.0000 mg | ORAL_TABLET | Freq: Every evening | ORAL | 5 refills | Status: DC | PRN

## 2019-11-11 MED ORDER — ESZOPICLONE 1 MG PO TABS: 1.0000 mg | ORAL_TABLET | Freq: Every evening | ORAL | 3 refills | Status: DC | PRN

## 2019-11-11 MED ORDER — BUPROPION HCL ER (SR) 150 MG PO TB12: 150.0000 mg | ORAL_TABLET | Freq: Every day | ORAL | 0 refills | Status: DC

## 2019-11-11 MED ORDER — SAXENDA 18 MG/3ML ~~LOC~~ SOPN: 3.0000 mg | PEN_INJECTOR | Freq: Every day | SUBCUTANEOUS | 0 refills | Status: DC

## 2019-11-11 NOTE — Progress Notes (Signed)
Chief Complaint:   OBESITY Tamara Ball is here to discuss her progress with her obesity treatment plan along with follow-up of her obesity related diagnoses. Tamara Ball is on the Category 3 Plan and states she is following her eating plan approximately 75% of the time. Tamara Ball states she is doing Zumba/cardio 60 minutes 3 times per week.  Today's visit was #: 21 Starting weight: 205 lbs Starting date: 06/13/2018 Today's weight: 185 lbs Today's date: 11/11/2019 Total lbs lost to date: 20 Total lbs lost since last in-office visit: 0  Interim History: Tamara Ball states that she is unsure whether or not she is reaching her protein goal daily as she is not weighing her protein. She finds herself excessively hungry on some days, but not others. She is traveling to [place] next week.  Subjective:   Insulin resistance. Tamara Ball has a diagnosis of insulin resistance based on her elevated fasting insulin level >5. She continues to work on diet and exercise to decrease her risk of diabetes. Tamara Ball is on Saxenda 3.0 mg. No nausea, vomiting, or diarrhea.   Lab Results  Component Value Date   INSULIN 9.1 06/17/2019   INSULIN 8.9 06/13/2018   Lab Results  Component Value Date   HGBA1C 4.9 06/17/2019   Other depression, with emotional eating. Tamara Ball is struggling with emotional eating and using food for comfort to the extent that it is negatively impacting her health. She has been working on behavior modification techniques to help reduce her emotional eating and has been somewhat successful. She shows no sign of suicidal or homicidal ideations. Tamara Ball reports cravings.  At risk for hyperglycemia. Tamara Ball is at risk for hyperglycemia due to insulin resistance, prediabetes or diabetes.  Assessment/Plan:   Insulin resistance. Tamara Ball will continue to work on weight loss, exercise, and decreasing simple carbohydrates to help decrease the risk of diabetes. Tamara Ball agreed to follow-up with [name] as directed to  closely monitor her progress. She will continue her medication as directed.   Other depression, with emotional eating. Behavior modification techniques were discussed today to help Tamara Ball deal with her emotional/non-hunger eating behaviors.  Orders and follow up as documented in patient record. Tamara Ball will start buPROPion (WELLBUTRIN SR) 150 MG 12 hr tablet QAM #30 with 0 refills.  At risk for hyperglycemia. Tamara Ball was given approximately 15 minutes of counseling today regarding prevention of hyperglycemia. She was advised of hyperglycemia causes and the fact hyperglycemia is often asymptomatic. Tamara Ball was instructed to avoid skipping meals, eat regular protein rich meals and schedule low calorie but protein rich snacks as needed.   Repetitive spaced learning was employed today to elicit superior memory formation and behavioral change.  Class 1 obesity with serious comorbidity and body mass index (BMI) of 30.0 to 30.9 in adult, unspecified obesity type - BMI greater than 30 at start of program. Refill was given for Liraglutide - Weight Management (SAXENDA) 18 MG/3ML SOPN #5 with 0 refills.  Tamara Ball is currently in the action stage of change. As such, her goal is to continue with weight loss efforts. She has agreed to the Category 3 Plan.   Exercise goals: For substantial health benefits, adults should do at least 150 minutes (2 hours and 30 minutes) a week of moderate-intensity, or 75 minutes (1 hour and 15 minutes) a week of vigorous-intensity aerobic physical activity, or an equivalent combination of moderate- and vigorous-intensity aerobic activity. Aerobic activity should be performed in episodes of at least 10 minutes, and preferably, it should be spread  throughout the week.  Behavioral modification strategies: meal planning and cooking strategies and keeping healthy foods in the home.  Tamara Ball has agreed to follow-up with our clinic in 3 weeks. She was informed of the importance of frequent  follow-up visits to maximize her success with intensive lifestyle modifications for her multiple health conditions.   Objective:   Blood pressure 102/69, pulse 72, temperature 98.2 F (36.8 C), temperature source Oral, height 5\' 6"  (1.676 m), weight 185 lb (83.9 kg), SpO2 98 %. Body mass index is 29.86 kg/m.  General: Cooperative, alert, well developed, in no acute distress. HEENT: Conjunctivae and lids unremarkable. Cardiovascular: Regular rhythm.  Lungs: Normal work of breathing. Neurologic: No focal deficits.   Lab Results  Component Value Date   CREATININE 1.02 (H) 07/11/2019   BUN 12 07/11/2019   NA 138 07/11/2019   K 3.5 07/11/2019   CL 105 07/11/2019   CO2 25 07/11/2019   Lab Results  Component Value Date   ALT 13 06/17/2019   AST 16 06/17/2019   ALKPHOS 59 06/17/2019   BILITOT 0.4 06/17/2019   Lab Results  Component Value Date   HGBA1C 4.9 06/17/2019   HGBA1C 5.0 05/27/2018   HGBA1C 5.1 05/22/2017   HGBA1C 5.1 05/16/2016   HGBA1C 5.0 04/07/2015   Lab Results  Component Value Date   INSULIN 9.1 06/17/2019   INSULIN 8.9 06/13/2018   Lab Results  Component Value Date   TSH 2.250 06/13/2018   Lab Results  Component Value Date   CHOL 109 06/17/2019   HDL 49 06/17/2019   LDLCALC 46 06/17/2019   TRIG 64 06/17/2019   CHOLHDL 2 05/27/2018   Lab Results  Component Value Date   WBC 13.7 (H) 09/08/2019   HGB 13.9 09/08/2019   HCT 42.6 09/08/2019   MCV 92.6 09/08/2019   PLT 209 09/08/2019   Lab Results  Component Value Date   IRON 110 09/08/2019   TIBC 368 09/08/2019   FERRITIN 237 09/08/2019   Attestation Statements:   Reviewed by clinician on day of visit: allergies, medications, problem list, medical history, surgical history, family history, social history, and previous encounter notes.  IMichaelene Song, am acting as transcriptionist for Abby Potash, PA-C   I have reviewed the above documentation for accuracy and completeness, and I agree  with the above. Abby Potash, PA-C

## 2019-11-11 NOTE — Progress Notes (Signed)
Prescription for Lunesta sent into pharmacy 

## 2019-11-11 NOTE — Telephone Encounter (Signed)
Called and left message for patient to call our office.

## 2019-11-11 NOTE — Telephone Encounter (Signed)
Spoke with patient regarding home sleep study results. Patient was negative for obstructive sleep apnea. Patient requesting refill of Lunesta and the removal of Ambien from her med profile. Dr. Ander Slade gave verbal order to do this. 2 month recall placed for follow up with Dr. Ander Slade.

## 2019-11-11 NOTE — Addendum Note (Signed)
Addended by: Tery Sanfilippo R on: 11/11/2019 02:29 PM   Modules accepted: Orders

## 2019-11-12 NOTE — Telephone Encounter (Signed)
Spoke with patient regarding home sleep study results. Patient was negative for obstructive sleep apnea. Patient requesting refill of Lunesta and the removal of Ambien from her med profile. Dr. Ander Slade gave verbal order to do this. 2 month recall placed for follow up with Dr. Ander Slade.

## 2019-12-01 ENCOUNTER — Ambulatory Visit: Payer: BC Managed Care – PPO | Admitting: Family Medicine

## 2019-12-02 ENCOUNTER — Other Ambulatory Visit: Payer: Self-pay

## 2019-12-02 ENCOUNTER — Encounter (INDEPENDENT_AMBULATORY_CARE_PROVIDER_SITE_OTHER): Payer: Self-pay | Admitting: Physician Assistant

## 2019-12-02 ENCOUNTER — Ambulatory Visit (INDEPENDENT_AMBULATORY_CARE_PROVIDER_SITE_OTHER): Payer: BC Managed Care – PPO | Admitting: Nurse Practitioner

## 2019-12-02 ENCOUNTER — Ambulatory Visit
Admission: RE | Admit: 2019-12-02 | Discharge: 2019-12-02 | Disposition: A | Discharge status: Home / Self Care | Payer: BC Managed Care – PPO | Source: Ambulatory Visit | Attending: Nurse Practitioner | Admitting: Nurse Practitioner

## 2019-12-02 VITALS — HR 85 | Temp 97.3°F | Resp 18

## 2019-12-02 DIAGNOSIS — R059 Cough, unspecified: Secondary | ICD-10-CM | POA: Insufficient documentation

## 2019-12-02 DIAGNOSIS — R0989 Other specified symptoms and signs involving the circulatory and respiratory systems: Secondary | ICD-10-CM

## 2019-12-02 DIAGNOSIS — R05 Cough: Secondary | ICD-10-CM | POA: Diagnosis not present

## 2019-12-02 MED ORDER — BENZONATATE 100 MG PO CAPS: 100.0000 mg | ORAL_CAPSULE | Freq: Two times a day (BID) | ORAL | 0 refills | Status: DC | PRN

## 2019-12-02 NOTE — Assessment & Plan Note (Addendum)
Post nasal drip:  Will order chest x ray - per patient request - will call with results - will order antibiotic if indicated  Will order tessalon perles  May start mucinex DM twice daily  Please restart Dymista  Stay active  Stay well hydrated  Continue current medications  Follow up:  Follow up as needed

## 2019-12-02 NOTE — Progress Notes (Signed)
@Patient  ID: Tamara Ball, female    DOB: 06-10-1980, 39 y.o.   MRN: [id number]  Chief Complaint  Patient presents with  . Cough    cough since 11/26/19    Referring provider: Caren Macadam, MD   39 year old female with history of migraine, allergic rhinitis, obesity, anemia.  HPI  Patient presents today for respiratory clinic visit.  Patient states that she was sent here by her PCP office because she has a complaint of cough.  Her PCP office would not see her in office, but only through televisit even though she did have a negative Covid test at [place] on Friday.  Patient states her cough started on 11/26/2019.  She denies any fever.  She states that she does have postnasal drip.  She denies any sinus pressure or pain.  She denies any recent nosebleed. States that cough is non-productive. She has noticed some chest tightness over the past couple of days associated with the cough. Patient has a long history of allergies. Denies f/c/s, n/v/d, hemoptysis, PND, chest pain or edema.        No Known Allergies  Immunization History  Administered Date(s) Administered  . Influenza,inj,Quad PF,6+ Mos 03/16/2014, 03/28/2016, 05/22/2017, 05/27/2018  . Influenza-Unspecified 02/27/2019  . Janssen (J&J) SARS-COV-2 Vaccination 05/09/2019  . Tdap 03/16/2014    Past Medical History:  Diagnosis Date  . Anemia   . Anxiety   . Chicken pox   . Chronic headaches   . Constipation   . Fibroids   . Iron deficiency anemia due to chronic blood loss    from heavy periods  . Menorrhagia   . Palpitations     Tobacco History: Social History   Tobacco Use  Smoking Status Never Smoker  Smokeless Tobacco Never Used   Counseling given: Yes   Outpatient Encounter Medications as of 12/02/2019  Medication Sig  . AVIANE 0.1-20 MG-MCG tablet   . b complex vitamins tablet Take 1 tablet by mouth daily.  . baclofen (LIORESAL) 10 MG tablet Take 10 mg by mouth as needed for muscle spasms.   . benzonatate (TESSALON) 100 MG capsule Take 1 capsule (100 mg total) by mouth 2 (two) times daily as needed for cough.  . Biotin 10000 MCG TABS Take 1 tablet by mouth daily.  Marland Kitchen buPROPion (WELLBUTRIN SR) 150 MG 12 hr tablet Take 1 tablet (150 mg total) by mouth daily.  Marland Kitchen DYMISTA 137-50 MCG/ACT SUSP 1 actuation each nostril twice daily  . eszopiclone (LUNESTA) 1 MG TABS tablet Take 1 tablet (1 mg total) by mouth at bedtime as needed for sleep. Take immediately before bedtime  . Insulin Pen Needle (BD PEN NEEDLE NANO 2ND GEN) 32G X 4 MM MISC 1 Package by Does not apply route 2 (two) times daily.  Marland Kitchen levocetirizine (XYZAL) 5 MG tablet Take 1 tablet (5 mg total) by mouth every evening.  . Liraglutide -Weight Management (SAXENDA) 18 MG/3ML SOPN Inject 0.5 mLs (3 mg total) into the skin daily.  . montelukast (SINGULAIR) 10 MG tablet Take 1 tablet (10 mg total) by mouth at bedtime.  . Multiple Vitamin (MULTIVITAMIN WITH MINERALS) TABS tablet Take 1 tablet by mouth daily.  . Omega-3 Fatty Acids (FISH OIL) 1000 MG CAPS Take 1 capsule by mouth daily.  . ondansetron (ZOFRAN) 4 MG tablet Take 1 tablet (4 mg total) by mouth every 8 (eight) hours as needed for nausea or vomiting.  . polyethylene glycol (MIRALAX / GLYCOLAX) 17 g packet Take  17 g by mouth daily.  Marland Kitchen zonisamide (ZONEGRAN) 50 MG capsule Take 150 mg by mouth daily.   No facility-administered encounter medications on file as of 12/02/2019.     Review of Systems  Review of Systems  Constitutional: Negative.  Negative for chills and fever.  HENT: Negative.   Respiratory: Positive for cough (non productive). Negative for shortness of breath and wheezing.   Cardiovascular: Negative.   Gastrointestinal: Negative.   Allergic/Immunologic: Negative.   Neurological: Negative.   Psychiatric/Behavioral: Negative.        Physical Exam  Pulse 85   Temp (!) 97.3 F (36.3 C)   Resp 18   SpO2 99% Comment: RA  Wt Readings from Last 5 Encounters:   11/11/19 185 lb (83.9 kg)  10/13/19 184 lb (83.5 kg)  09/25/19 188 lb (85.3 kg)  09/22/19 179 lb (81.2 kg)  09/17/19 186 lb 11.2 oz (84.7 kg)     Physical Exam Vitals and nursing note reviewed.  Constitutional:      General: She is not in acute distress.    Appearance: She is well-developed.  Cardiovascular:     Rate and Rhythm: Normal rate.  Pulmonary:     Effort: Pulmonary effort is normal.     Breath sounds: Normal breath sounds. No wheezing.     Comments: Lung sounds diminished throughout Musculoskeletal:     Right lower leg: No edema.     Left lower leg: No edema.  Neurological:     Mental Status: She is alert and oriented to person, place, and time.  Psychiatric:        Mood and Affect: Mood normal.        Behavior: Behavior normal.       Assessment & Plan:   Cough Post nasal drip:  Will order chest x ray - per patient request - will call with results - will order antibiotic if indicated  Will order tessalon perles  May start mucinex DM twice daily  Please restart Dymista  Stay active  Stay well hydrated  Continue current medications  Follow up:  Follow up as needed      Fenton Foy, NP 12/02/2019

## 2019-12-02 NOTE — Patient Instructions (Addendum)
Cough Post nasal drip:  Will order chest x ray - per patient request - will call with results - will order antibiotic if indicated  Will order tessalon perles  May start mucinex DM twice daily  Please restart Dymista  Stay active  Stay well hydrated  Continue current medications  Follow up:  Follow up as needed

## 2019-12-05 ENCOUNTER — Encounter: Payer: Self-pay | Admitting: Family Medicine

## 2019-12-06 MED ORDER — SPACER/AERO-HOLDING CHAMBERS DEVI: 1.0000 | Freq: Every day | 0 refills | Status: DC | PRN

## 2019-12-06 MED ORDER — ALBUTEROL SULFATE HFA 108 (90 BASE) MCG/ACT IN AERS: 2.0000 | INHALATION_SPRAY | Freq: Four times a day (QID) | RESPIRATORY_TRACT | 1 refills | Status: DC | PRN

## 2019-12-08 ENCOUNTER — Ambulatory Visit (INDEPENDENT_AMBULATORY_CARE_PROVIDER_SITE_OTHER): Payer: BC Managed Care – PPO | Admitting: Physician Assistant

## 2019-12-09 ENCOUNTER — Inpatient Hospital Stay: Payer: BC Managed Care – PPO | Attending: Family

## 2019-12-09 ENCOUNTER — Other Ambulatory Visit: Payer: Self-pay

## 2019-12-09 DIAGNOSIS — N920 Excessive and frequent menstruation with regular cycle: Secondary | ICD-10-CM

## 2019-12-09 DIAGNOSIS — D500 Iron deficiency anemia secondary to blood loss (chronic): Secondary | ICD-10-CM

## 2019-12-09 LAB — IRON AND TIBC
Iron: 105 ug/dL (ref 41–142)
Saturation Ratios: 27 % (ref 21–57)
TIBC: 394 ug/dL (ref 236–444)
UIBC: 289 ug/dL (ref 120–384)

## 2019-12-09 LAB — CBC WITH DIFFERENTIAL (CANCER CENTER ONLY)
Abs Immature Granulocytes: 0.02 10*3/uL (ref 0.00–0.07)
Basophils Absolute: 0 10*3/uL (ref 0.0–0.1)
Basophils Relative: 1 %
Eosinophils Absolute: 0.3 10*3/uL (ref 0.0–0.5)
Eosinophils Relative: 4 %
HCT: 45.1 % (ref 36.0–46.0)
Hemoglobin: 14.7 g/dL (ref 12.0–15.0)
Immature Granulocytes: 0 %
Lymphocytes Relative: 22 %
Lymphs Abs: 1.7 10*3/uL (ref 0.7–4.0)
MCH: 29.9 pg (ref 26.0–34.0)
MCHC: 32.6 g/dL (ref 30.0–36.0)
MCV: 91.9 fL (ref 80.0–100.0)
Monocytes Absolute: 0.4 10*3/uL (ref 0.1–1.0)
Monocytes Relative: 5 %
Neutro Abs: 5.4 10*3/uL (ref 1.7–7.7)
Neutrophils Relative %: 68 %
Platelet Count: 191 10*3/uL (ref 150–400)
RBC: 4.91 MIL/uL (ref 3.87–5.11)
RDW: 13 % (ref 11.5–15.5)
WBC Count: 7.8 10*3/uL (ref 4.0–10.5)
nRBC: 0 % (ref 0.0–0.2)

## 2019-12-09 LAB — RETICULOCYTES
Immature Retic Fract: 12.3 % (ref 2.3–15.9)
RBC.: 4.95 MIL/uL (ref 3.87–5.11)
Retic Count, Absolute: 105.4 10*3/uL (ref 19.0–186.0)
Retic Ct Pct: 2.1 % (ref 0.4–3.1)

## 2019-12-09 LAB — FERRITIN: Ferritin: 160 ng/mL (ref 11–307)

## 2019-12-16 ENCOUNTER — Encounter (INDEPENDENT_AMBULATORY_CARE_PROVIDER_SITE_OTHER): Payer: Self-pay | Admitting: Physician Assistant

## 2019-12-22 ENCOUNTER — Telehealth: Payer: Self-pay | Admitting: Family Medicine

## 2019-12-22 NOTE — Telephone Encounter (Signed)
Pt is calling in wanted to know if it is okay to take singulair 10 MG and levocetirizine 5 MG in the morning and Benedryl in the afternoon for the itching that she is having.  Pt has an appointment scheduled for 12/29/2019 at 10:00.  Pt stated that the hydrocodone cream is not really helping her.  Pt would like to have a call back to discuss in detail of what is going on with her body (breaking out on different parts on different days not the same everyday).

## 2019-12-23 ENCOUNTER — Ambulatory Visit
Admission: RE | Admit: 2019-12-23 | Discharge: 2019-12-23 | Disposition: A | Discharge status: Home / Self Care | Payer: BC Managed Care – PPO | Source: Ambulatory Visit | Attending: Physician Assistant | Admitting: Physician Assistant

## 2019-12-23 ENCOUNTER — Other Ambulatory Visit: Payer: Self-pay

## 2019-12-23 VITALS — BP 124/81 | HR 74 | Temp 98.2°F | Resp 18

## 2019-12-23 DIAGNOSIS — R21 Rash and other nonspecific skin eruption: Secondary | ICD-10-CM

## 2019-12-23 MED ORDER — SARNA 0.5-0.5 % EX LOTN: 1.0000 "application " | TOPICAL_LOTION | CUTANEOUS | 0 refills | Status: DC | PRN

## 2019-12-23 MED ORDER — PREDNISONE 50 MG PO TABS: 50.0000 mg | ORAL_TABLET | Freq: Every day | ORAL | 0 refills | Status: DC

## 2019-12-23 NOTE — ED Triage Notes (Signed)
Pt c/o rash to arms, lower legs, chest, and back since Thursday. States taken benadryl and zyrtec with relief but the rash returns. C/o raised areas, itching, and red. States top of feet is the worse. Denies any changes in lotions or soap. States only change is eat candy from work since Thursday and after eating it today her lt arm started itching.

## 2019-12-23 NOTE — ED Provider Notes (Signed)
[facility]    CSN: [id number] Arrival date & time: 12/23/19  1543      History   Chief Complaint Chief Complaint  Patient presents with   appt 4 - rash    HPI Tamara Ball is a 39 y.o. female.   39 year old female comes in for 5 day history of intermittent rash to the body. Has noticed rash to BUE, BLE, trunk. States day of symptom onset, did increase candy consumption, though no new ingredients that she knows of. Also with increased heat exposure/sweating. No new hygiene product changes. Will have raised rash that is itching that will resolve with benadryl/zyrtec, then returns.      Past Medical History:  Diagnosis Date   Anemia    Anxiety    Chicken pox    Chronic headaches    Constipation    Fibroids    Iron deficiency anemia due to chronic blood loss    from heavy periods   Menorrhagia    Palpitations     Patient Active Problem List   Diagnosis Date Noted   Cough 12/02/2019   Chest congestion 12/02/2019   Migraine 09/19/2019   Class 1 obesity with serious comorbidity and body mass index (BMI) of 33.0 to 33.9 in adult 06/17/2018   Menorrhagia    Iron deficiency anemia due to chronic blood loss    Allergic rhinitis 02/05/2014    History reviewed. No pertinent surgical history.  OB History    Gravida  3   Para      Term      Preterm      AB      Living  1     SAB      TAB      Ectopic      Multiple      Live Births               Home Medications    Prior to Admission medications   Medication Sig Start Date End Date Taking? Authorizing Provider  albuterol (VENTOLIN HFA) 108 (90 Base) MCG/ACT inhaler Inhale 2 puffs into the lungs every 6 (six) hours as needed for wheezing or shortness of breath. 12/06/19   Caren Macadam, MD  AVIANE 0.1-20 MG-MCG tablet  09/19/16   [provider]  b complex vitamins tablet Take 1 tablet by mouth daily.    [provider]  baclofen (LIORESAL)  10 MG tablet Take 10 mg by mouth as needed for muscle spasms.    [provider]  Biotin 10000 MCG TABS Take 1 tablet by mouth daily.    [provider]  buPROPion (WELLBUTRIN SR) 150 MG 12 hr tablet Take 1 tablet (150 mg total) by mouth daily. 11/11/19   Abby Potash, PA-C  camphor-menthol [facility]) lotion Apply 1 application topically as needed for itching. 12/23/19   Ok Edwards, PA-C  DYMISTA 137-50 MCG/ACT SUSP 1 actuation each nostril twice daily 01/14/19   [name], DO  eszopiclone (LUNESTA) 1 MG TABS tablet Take 1 tablet (1 mg total) by mouth at bedtime as needed for sleep. Take immediately before bedtime 11/11/19   Olalere, Adewale A, MD  Insulin Pen Needle (BD PEN NEEDLE NANO 2ND GEN) 32G X 4 MM MISC 1 Package by Does not apply route 2 (two) times daily. 05/27/19   Abby Potash, PA-C  levocetirizine (XYZAL) 5 MG tablet Take 1 tablet (5 mg total) by mouth every evening. 09/17/19   Koberlein,  Steele Berg, MD  Liraglutide -Weight Management (SAXENDA) 18 MG/3ML SOPN Inject 0.5 mLs (3 mg total) into the skin daily. 11/11/19   Abby Potash, PA-C  montelukast (SINGULAIR) 10 MG tablet Take 1 tablet (10 mg total) by mouth at bedtime. 09/17/19   Caren Macadam, MD  Multiple Vitamin (MULTIVITAMIN WITH MINERALS) TABS tablet Take 1 tablet by mouth daily.    [provider]  Omega-3 Fatty Acids (FISH OIL) 1000 MG CAPS Take 1 capsule by mouth daily.    [provider]  ondansetron (ZOFRAN) 4 MG tablet Take 1 tablet (4 mg total) by mouth every 8 (eight) hours as needed for nausea or vomiting. 05/27/19   Abby Potash, PA-C  polyethylene glycol (MIRALAX / GLYCOLAX) 17 g packet Take 17 g by mouth daily.    [provider]  predniSONE (DELTASONE) 50 MG tablet Take 1 tablet (50 mg total) by mouth daily with breakfast. 12/23/19   Ok Edwards, PA-C  Spacer/Aero-Holding Chambers DEVI 1 Device by Does not apply route daily as needed. 12/06/19   Caren Macadam, MD    zonisamide (ZONEGRAN) 50 MG capsule Take 150 mg by mouth daily. 08/27/19   [provider]    Family History Family History  Problem Relation Age of Onset   Hypertension Mother    Cancer Mother        skin cancer ?type but required radiation   Healthy Maternal Grandmother    Diabetes Maternal Grandfather    Glaucoma Maternal Grandfather    Hypertension Maternal Grandfather    Hyperlipidemia Maternal Grandfather    Gout Maternal Grandfather    Hypertension Maternal Uncle    Colon polyps Neg Hx    Colon cancer Neg Hx    Esophageal cancer Neg Hx     Social History Social History   Tobacco Use   Smoking status: Never Smoker   Smokeless tobacco: Never Used  Vaping Use   Vaping Use: Never used  Substance Use Topics   Alcohol use: Yes    Alcohol/week: 0.0 standard drinks    Comment: social   Drug use: No     Allergies   Patient has no known allergies.   Review of Systems Review of Systems  Reason unable to perform ROS: See HPI as above.     Physical Exam Triage Vital Signs ED Triage Vitals  Enc Vitals Group     BP 12/23/19 1556 124/81     Pulse Rate 12/23/19 1556 74     Resp 12/23/19 1556 18     Temp 12/23/19 1556 98.2 F (36.8 C)     Temp Source 12/23/19 1556 Oral     SpO2 12/23/19 1556 95 %     Weight --      Height --      Head Circumference --      Peak Flow --      Pain Score 12/23/19 1557 0     Pain Loc --      Pain Edu? --      Excl. in [place]? --    No data found.  Updated Vital Signs BP 124/81 (BP Location: Left Arm)    Pulse 74    Temp 98.2 F (36.8 C) (Oral)    Resp 18    LMP 12/22/2019    SpO2 95%   Physical Exam Constitutional:      General: She is not in acute distress.    Appearance: Normal appearance. She is well-developed. She is not  toxic-appearing or diaphoretic.  HENT:     Head: Normocephalic and atraumatic.  Eyes:     Conjunctiva/sclera: Conjunctivae normal.     Pupils: Pupils are equal, round, and  reactive to light.  Pulmonary:     Effort: Pulmonary effort is normal. No respiratory distress.  Musculoskeletal:     Cervical back: Normal range of motion and neck supple.  Skin:    General: Skin is warm and dry.     Comments: Few hives to the left foot/medial ankle. Minimal erythema. No warmth, induration, fluctuance. No tenderness  Neurological:     Mental Status: She is alert and oriented to person, place, and time.      UC Treatments / Results  Labs (all labs ordered are listed, but only abnormal results are displayed) Labs Reviewed - No data to display  EKG   Radiology No results found.  Procedures Procedures (including critical care time)  Medications Ordered in UC Medications - No data to display  Initial Impression / Assessment and Plan / UC Course  I have reviewed the triage vital signs and the nursing notes.  Pertinent labs & imaging results that were available during my care of the patient were reviewed by me and considered in my medical decision making (see chart for details).    Prednisone as directed. Other symptomatic treatment discussed. Return precautions given.  Final Clinical Impressions(s) / UC Diagnoses   Final diagnoses:  Rash and nonspecific skin eruption    ED Prescriptions    Medication Sig Dispense Auth. Provider   predniSONE (DELTASONE) 50 MG tablet Take 1 tablet (50 mg total) by mouth daily with breakfast. 5 tablet Montgomery Favor V, PA-C   camphor-menthol [facility]) lotion Apply 1 application topically as needed for itching. 222 mL Ok Edwards, PA-C     PDMP not reviewed this encounter.   Ok Edwards, PA-C 12/23/19 1622

## 2019-12-23 NOTE — Telephone Encounter (Signed)
Noted  

## 2019-12-23 NOTE — Telephone Encounter (Signed)
Pt called to check to see what she should do about her skin breaking out and is aware that if it is getting worse before her appointment to seek assistance at [facility].

## 2019-12-23 NOTE — Discharge Instructions (Signed)
Prednisone as directed. Continue allergy medicine for itching. Can try SARNA lotion, ice compress for itching. Follow up with PCP if symptoms not improving.

## 2019-12-23 NOTE — Telephone Encounter (Signed)
Patient currently in ER.

## 2019-12-29 ENCOUNTER — Other Ambulatory Visit: Payer: Self-pay

## 2019-12-29 ENCOUNTER — Ambulatory Visit: Payer: BC Managed Care – PPO | Admitting: Family Medicine

## 2019-12-29 ENCOUNTER — Encounter: Payer: Self-pay | Admitting: Family Medicine

## 2019-12-29 VITALS — BP 102/80 | HR 69 | Temp 98.1°F | Ht 66.0 in | Wt 195.2 lb

## 2019-12-29 DIAGNOSIS — L509 Urticaria, unspecified: Secondary | ICD-10-CM

## 2019-12-29 MED ORDER — PREDNISONE 20 MG PO TABS: ORAL_TABLET | ORAL | 0 refills | Status: DC

## 2019-12-29 NOTE — Progress Notes (Signed)
Mayo Ao DOB: 15-Oct-1980 Encounter date: 12/29/2019  This is a 39 y.o. female who presents with Chief Complaint  Patient presents with  . Urticaria    x11 day on hands, feet and back, seen at an urgent care last week, given Prednisone and told symptoms could be due to food allergy, using Benadryl    History of present illness: Seen at urgent care 8/17 for rash on arms legs and trunk. Sx started on 8/12. Hasn't changed any products because she tends to be sensitive. Day that she broke out she had event at work - was outside and was sweating heavily outside. 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 That week there was candy - laffy taffy, nerds; way more than usual. That week at shrimp, chicken at restaurant and 2 days later (day of breakout) ate leftovers. Very itchy when she took shower that day. Just kept getting worse even after thinking through what was new. Morning rhinitis med sustains her until afternoon and then 3-4 she starts itching again.   Day she went to urgent care arm had broken out.   Has had allergy testing before - nothing has come back in past.   Still taking benadryl at this point. Itching not as bad, but is still needing to take it for the itching.   Can be anywhere on body - primary - top of feet, hands, inside wrists - most consistent. But has been on back, breast, chest, legs. Nothing on face, not under arms, not vaginal. It is better now overall.   In last couple of years can eat shrimp and maybe gets tingly, but not super bad - not itching. Just notes tingling.   Hadn't had laffy taffy type that she tried in past. Was strawberry but was different than previous.   Has been bumps, hives. Sometimes raised and sometimes just red and itchy.   Has been 5-6 years since allergy testing.   No sinus/allergy sx. Headaches better.   No Known Allergies Current Meds  Medication Sig  . albuterol (VENTOLIN HFA) 108 (90 Base) MCG/ACT inhaler Inhale 2 puffs into the lungs every  6 (six) hours as needed for wheezing or shortness of breath.  Vassie Moselle 0.1-20 MG-MCG tablet   . b complex vitamins tablet Take 1 tablet by mouth daily.  . baclofen (LIORESAL) 10 MG tablet Take 10 mg by mouth as needed for muscle spasms.  . Biotin 10000 MCG TABS Take 1 tablet by mouth daily.  Marland Kitchen buPROPion (WELLBUTRIN SR) 150 MG 12 hr tablet Take 1 tablet (150 mg total) by mouth daily.  . camphor-menthol (SARNA) lotion Apply 1 application topically as needed for itching.  . diphenhydrAMINE HCl (BENADRYL PO) Take by mouth as needed.  Marland Kitchen DYMISTA 137-50 MCG/ACT SUSP 1 actuation each nostril twice daily  . eszopiclone (LUNESTA) 1 MG TABS tablet Take 1 tablet (1 mg total) by mouth at bedtime as needed for sleep. Take immediately before bedtime  . Insulin Pen Needle (BD PEN NEEDLE NANO 2ND GEN) 32G X 4 MM MISC 1 Package by Does not apply route 2 (two) times daily.  Marland Kitchen levocetirizine (XYZAL) 5 MG tablet Take 1 tablet (5 mg total) by mouth every evening.  . Liraglutide -Weight Management (SAXENDA) 18 MG/3ML SOPN Inject 0.5 mLs (3 mg total) into the skin daily.  . montelukast (SINGULAIR) 10 MG tablet Take 1 tablet (10 mg total) by mouth at bedtime.  . Multiple Vitamin (MULTIVITAMIN WITH MINERALS) TABS tablet Take 1 tablet by mouth daily.  Marland Kitchen  Omega-3 Fatty Acids (FISH OIL) 1000 MG CAPS Take 1 capsule by mouth daily.  . ondansetron (ZOFRAN) 4 MG tablet Take 1 tablet (4 mg total) by mouth every 8 (eight) hours as needed for nausea or vomiting.  . polyethylene glycol (MIRALAX / GLYCOLAX) 17 g packet Take 17 g by mouth daily.  Marland Kitchen zonisamide (ZONEGRAN) 50 MG capsule Take 150 mg by mouth daily.    Review of Systems  Constitutional: Negative for chills, fatigue and fever.  Respiratory: Negative for cough, chest tightness, shortness of breath and wheezing.   Cardiovascular: Negative for chest pain, palpitations and leg swelling.  Skin: Positive for rash.    Objective:  BP 102/80 (BP Location: Left Arm, Patient  Position: Sitting, Cuff Size: Large)   Pulse 69   Temp 98.1 F (36.7 C) (Oral)   Ht 5\' 6"  (1.676 m)   Wt 195 lb 3.2 oz (88.5 kg)   LMP 12/22/2019   BMI 31.51 kg/m   Weight: 195 lb 3.2 oz (88.5 kg)   BP Readings from Last 3 Encounters:  12/29/19 102/80  12/23/19 124/81  11/11/19 102/69   Wt Readings from Last 3 Encounters:  12/29/19 195 lb 3.2 oz (88.5 kg)  11/11/19 185 lb (83.9 kg)  10/13/19 184 lb (83.5 kg)    Physical Exam Constitutional:      General: She is not in acute distress.    Appearance: She is well-developed.  Cardiovascular:     Rate and Rhythm: Normal rate and regular rhythm.     Heart sounds: Normal heart sounds. No murmur heard.  No friction rub.  Pulmonary:     Effort: Pulmonary effort is normal. No respiratory distress.     Breath sounds: Normal breath sounds. No wheezing or rales.  Chest:     Comments: Subtle hyperpigmentation under bilateral breasts, more notable on the left Musculoskeletal:     Right lower leg: No edema.     Left lower leg: No edema.  Skin:    Comments: No current hives or rashes.  There is slight excoriation right inner arm noted.  Neurological:     Mental Status: She is alert and oriented to person, place, and time.  Psychiatric:        Behavior: Behavior normal.     Assessment/Plan   1. Hives Uncertain exact etiology.  We will start with some blood work, and if nothing positive, consider referral to allergy for further evaluation.  Patient does not wish to see lumbar allergy, so will need to be referred outside of the system.  She is already using Dove, taking cool showers, limiting topical lotions, etc.  Okay to use Benadryl as needed.  Prednisone sent in in case of worsening to have on hand. - Allergy Panel 19, [employer]; Future - Alpha-Gal Panel; Future - Allergen, Strawberry, f44; Future - Allergen, Red (Carmine) Dye, [id number]; Future   Return for pending labs.     Micheline Rough, MD

## 2020-01-02 LAB — ALLERGY PANEL 19, SEAFOOD GROUP
Allergen, Salmon, f41: <0.1 kU/L
CLASS: 0
CLASS: 0
CLASS: 0
CLASS: 0
CLASS: 0
Class: 0
Crab: <0.1 kU/L
Fish Cod: <0.1 kU/L
Lobster: <0.1 kU/L
Shrimp IgE: <0.1 kU/L
Tuna IgE: <0.1 kU/L

## 2020-01-02 LAB — ALPHA-GAL PANEL
Beef IgE: <0.1 kU/L (ref <0.35)
Class: 0
Class: 0
Class: 0
Galactose-alpha-1,3-galactose IgE: <0.1 kU/L (ref <0.10)
LAMB/MUTTON IGE: <0.1 kU/L (ref <0.35)
Pork IgE: <0.1 kU/L (ref <0.35)

## 2020-01-02 LAB — ALLERGEN, RED (CARMINE) DYE, RF340
Allergen, Red (Carmine) Dye, f340: <0.1 kU/L
CLASS: 0

## 2020-01-02 LAB — ALLERGEN, STRAWBERRY, F44
Allergen, Strawberry, f44: <0.1 kU/L
CLASS: 0

## 2020-01-02 LAB — INTERPRETATION:

## 2020-01-03 ENCOUNTER — Encounter: Payer: Self-pay | Admitting: Family Medicine

## 2020-01-05 ENCOUNTER — Telehealth: Payer: Self-pay | Admitting: Family Medicine

## 2020-01-05 ENCOUNTER — Other Ambulatory Visit: Payer: Self-pay | Admitting: Family Medicine

## 2020-01-05 MED ORDER — FLUCONAZOLE 150 MG PO TABS: ORAL_TABLET | ORAL | 0 refills | Status: DC

## 2020-01-05 NOTE — Telephone Encounter (Signed)
Pharmacy is need clarification on the direction of Rx fluconazole (DIFLUCAN) 150 MG

## 2020-01-06 ENCOUNTER — Encounter (INDEPENDENT_AMBULATORY_CARE_PROVIDER_SITE_OTHER): Payer: Self-pay | Admitting: Physician Assistant

## 2020-01-06 ENCOUNTER — Other Ambulatory Visit: Payer: Self-pay

## 2020-01-06 ENCOUNTER — Ambulatory Visit (INDEPENDENT_AMBULATORY_CARE_PROVIDER_SITE_OTHER): Payer: BC Managed Care – PPO | Admitting: Physician Assistant

## 2020-01-06 VITALS — BP 109/63 | HR 66 | Temp 98.7°F | Ht 66.0 in | Wt 189.0 lb

## 2020-01-06 DIAGNOSIS — Z9189 Other specified personal risk factors, not elsewhere classified: Secondary | ICD-10-CM

## 2020-01-06 DIAGNOSIS — E8881 Metabolic syndrome: Secondary | ICD-10-CM

## 2020-01-06 DIAGNOSIS — E669 Obesity, unspecified: Secondary | ICD-10-CM

## 2020-01-06 DIAGNOSIS — F3289 Other specified depressive episodes: Secondary | ICD-10-CM | POA: Diagnosis not present

## 2020-01-06 DIAGNOSIS — Z6830 Body mass index (BMI) 30.0-30.9, adult: Secondary | ICD-10-CM

## 2020-01-06 MED ORDER — BUPROPION HCL ER (SR) 200 MG PO TB12: 200.0000 mg | ORAL_TABLET | Freq: Every day | ORAL | 0 refills | Status: DC

## 2020-01-06 MED ORDER — BD PEN NEEDLE NANO 2ND GEN 32G X 4 MM MISC: 1.0000 | Freq: Every day | 0 refills | Status: DC

## 2020-01-06 MED ORDER — SAXENDA 18 MG/3ML ~~LOC~~ SOPN: 3.0000 mg | PEN_INJECTOR | Freq: Every day | SUBCUTANEOUS | 0 refills | Status: DC

## 2020-01-06 NOTE — Addendum Note (Signed)
Addended by: Agnes Lawrence on: 01/06/2020 10:24 AM   Modules accepted: Orders

## 2020-01-06 NOTE — Progress Notes (Signed)
Chief Complaint:   OBESITY Tamara Ball is here to discuss her progress with her obesity treatment plan along with follow-up of her obesity related diagnoses. Tamara Ball is on the Category 3 Plan and states she is following her eating plan approximately 50% of the time. Tamara Ball states she is exercising 0 minutes 0 times per week.  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 She has also been on steroids for the last 3 weeks, which has increased her hunger and cravings for sugar and chips. She has also been off her [name] and Wellbutrin.   Subjective:   Other depression, with emotional eating. Tamara Ball is struggling with emotional eating and using food for comfort to the extent that it is negatively impacting her health. She has been working on behavior modification techniques to help reduce her emotional eating and has been somewhat successful. She shows no sign of suicidal or homicidal ideations. Tamara Ball reports intense cravings since being off of Wellbutrin, but does not fee that 150 mg was helping.  Insulin resistance. Tamara Ball has a diagnosis of insulin resistance based on her elevated fasting insulin level >5. She continues to work on diet and exercise to decrease her risk of diabetes. Cravings have increased off of Saxenda and bupropion for the last month.  Lab Results  Component Value Date   INSULIN 9.1 06/17/2019   INSULIN 8.9 06/13/2018   Lab Results  Component Value Date   HGBA1C 4.9 06/17/2019   At risk for diabetes mellitus. Tamara Ball is at higher than average risk for developing diabetes due to her obesity.   Assessment/Plan:   Other depression, with emotional eating. Behavior modification techniques were discussed today to help Brandon deal with her  emotional/non-hunger eating behaviors.  Orders and follow up as documented in patient record. Tamara Ball will increase her dose of buPROPion (WELLBUTRIN SR) to 200 MG 12 hr tablet #30 with 0 refills.  Insulin resistance. Tamara Ball will continue to work on weight loss, exercise, and decreasing simple carbohydrates to help decrease the risk of diabetes. Tamara Ball agreed to follow-up with [name] as directed to closely monitor her progress. She will continue her medications as directed.   At risk for diabetes mellitus. Tamara Ball was given approximately 15 minutes of diabetes education and counseling today. We discussed intensive lifestyle modifications today with an emphasis on weight loss as well as increasing exercise and decreasing simple carbohydrates in her diet. We also reviewed medication options with an emphasis on risk versus benefit of those discussed.   Repetitive spaced learning was employed today to elicit superior memory formation and behavioral change.  Class 1 obesity with serious comorbidity and body mass index (BMI) of 30.0 to 30.9 in adult, unspecified obesity type. Refill was given for Liraglutide -Weight Management (SAXENDA) 18 MG/3ML SOPN #5 with 0 refills. Prescription was given for Insulin Pen Needle (BD PEN NEEDLE NANO 2ND GEN) 32G X 4 MM MISC #100.  Tamara Ball is currently in the action stage of change. As such, her goal is to continue with weight loss efforts. She has agreed to the Category 3 Plan.   Exercise goals: For substantial health benefits, adults should do at least 150 minutes (2 hours and 30 minutes) a week of moderate-intensity, or 75 minutes (1 hour and 15 minutes) a  week of vigorous-intensity aerobic physical activity, or an equivalent combination of moderate- and vigorous-intensity aerobic activity. Aerobic activity should be performed in episodes of at least 10 minutes, and preferably, it should be spread throughout the week.  Behavioral modification strategies: decreasing simple  carbohydrates and avoiding temptations.  Tamara Ball has agreed to follow-up with our clinic in 3 weeks. She was informed of the importance of frequent follow-up visits to maximize her success with intensive lifestyle modifications for her multiple health conditions.   Objective:   Blood pressure 109/63, pulse 66, temperature 98.7 F (37.1 C), temperature source Oral, height 5\' 6"  (1.676 m), weight 189 lb (85.7 kg), last menstrual period 12/22/2019, SpO2 100 %. Body mass index is 30.51 kg/m.  General: Cooperative, alert, well developed, in no acute distress. HEENT: Conjunctivae and lids unremarkable. Cardiovascular: Regular rhythm.  Lungs: Normal work of breathing. Neurologic: No focal deficits.   Lab Results  Component Value Date   CREATININE 1.02 (H) 07/11/2019   BUN 12 07/11/2019   NA 138 07/11/2019   K 3.5 07/11/2019   CL 105 07/11/2019   CO2 25 07/11/2019   Lab Results  Component Value Date   ALT 13 06/17/2019   AST 16 06/17/2019   ALKPHOS 59 06/17/2019   BILITOT 0.4 06/17/2019   Lab Results  Component Value Date   HGBA1C 4.9 06/17/2019   HGBA1C 5.0 05/27/2018   HGBA1C 5.1 05/22/2017   HGBA1C 5.1 05/16/2016   HGBA1C 5.0 04/07/2015   Lab Results  Component Value Date   INSULIN 9.1 06/17/2019   INSULIN 8.9 06/13/2018   Lab Results  Component Value Date   TSH 2.250 06/13/2018   Lab Results  Component Value Date   CHOL 109 06/17/2019   HDL 49 06/17/2019   LDLCALC 46 06/17/2019   TRIG 64 06/17/2019   CHOLHDL 2 05/27/2018   Lab Results  Component Value Date   WBC 7.8 12/09/2019   HGB 14.7 12/09/2019   HCT 45.1 12/09/2019   MCV 91.9 12/09/2019   PLT 191 12/09/2019   Lab Results  Component Value Date   IRON 105 12/09/2019   TIBC 394 12/09/2019   FERRITIN 160 12/09/2019   Attestation Statements:   Reviewed by clinician on day of visit: allergies, medications, problem list, medical history, surgical history, family history, social history, and previous  encounter notes.  IMichaelene Song, am acting as transcriptionist for Abby Potash, PA-C   I have reviewed the above documentation for accuracy and completeness, and I agree with the above. Abby Potash, PA-C

## 2020-01-07 NOTE — Telephone Encounter (Signed)
Left a detailed message with the information below on the voicemail at the pharmacy.

## 2020-01-07 NOTE — Telephone Encounter (Signed)
Take 1 tablet now.  Repeat in 72 hours if symptoms are still present.  Dispense 2.

## 2020-01-27 ENCOUNTER — Encounter (INDEPENDENT_AMBULATORY_CARE_PROVIDER_SITE_OTHER): Payer: Self-pay | Admitting: Physician Assistant

## 2020-01-27 ENCOUNTER — Ambulatory Visit (INDEPENDENT_AMBULATORY_CARE_PROVIDER_SITE_OTHER): Payer: BC Managed Care – PPO | Admitting: Physician Assistant

## 2020-01-27 ENCOUNTER — Other Ambulatory Visit: Payer: Self-pay

## 2020-01-27 VITALS — BP 108/72 | HR 82 | Temp 98.2°F | Ht 66.0 in | Wt 183.0 lb

## 2020-01-27 DIAGNOSIS — F3289 Other specified depressive episodes: Secondary | ICD-10-CM

## 2020-01-27 DIAGNOSIS — E8881 Metabolic syndrome: Secondary | ICD-10-CM

## 2020-01-27 DIAGNOSIS — E559 Vitamin D deficiency, unspecified: Secondary | ICD-10-CM

## 2020-01-27 DIAGNOSIS — Z6830 Body mass index (BMI) 30.0-30.9, adult: Secondary | ICD-10-CM

## 2020-01-27 DIAGNOSIS — Z9189 Other specified personal risk factors, not elsewhere classified: Secondary | ICD-10-CM | POA: Diagnosis not present

## 2020-01-27 DIAGNOSIS — E669 Obesity, unspecified: Secondary | ICD-10-CM | POA: Diagnosis not present

## 2020-01-27 MED ORDER — SAXENDA 18 MG/3ML ~~LOC~~ SOPN: 3.0000 mg | PEN_INJECTOR | Freq: Every day | SUBCUTANEOUS | 0 refills | Status: DC

## 2020-01-27 MED ORDER — BUPROPION HCL ER (SR) 200 MG PO TB12: 200.0000 mg | ORAL_TABLET | Freq: Every day | ORAL | 0 refills | Status: DC

## 2020-01-28 LAB — COMPREHENSIVE METABOLIC PANEL
ALT: 7 IU/L (ref 0–32)
AST: 8 IU/L (ref 0–40)
Albumin/Globulin Ratio: 1.6 (ref 1.2–2.2)
Albumin: 4.5 g/dL (ref 3.8–4.8)
Alkaline Phosphatase: 61 IU/L (ref 44–121)
BUN/Creatinine Ratio: 11 (ref 9–23)
BUN: 11 mg/dL (ref 6–24)
Bilirubin Total: 0.5 mg/dL (ref 0.0–1.2)
CO2: 25 mmol/L (ref 20–29)
Calcium: 9.7 mg/dL (ref 8.7–10.2)
Chloride: 101 mmol/L (ref 96–106)
Creatinine, Ser: 0.98 mg/dL (ref 0.57–1.00)
GFR calc Af Amer: 83 mL/min/{1.73_m2} (ref >59)
GFR calc non Af Amer: 72 mL/min/{1.73_m2} (ref >59)
Globulin, Total: 2.9 g/dL (ref 1.5–4.5)
Glucose: 79 mg/dL (ref 65–99)
Potassium: 4.4 mmol/L (ref 3.5–5.2)
Sodium: 139 mmol/L (ref 134–144)
Total Protein: 7.4 g/dL (ref 6.0–8.5)

## 2020-01-28 LAB — HEMOGLOBIN A1C
Est. average glucose Bld gHb Est-mCnc: 100 mg/dL
Hgb A1c MFr Bld: 5.1 % (ref 4.8–5.6)

## 2020-01-28 LAB — INSULIN, RANDOM: INSULIN: 12.6 u[IU]/mL (ref 2.6–24.9)

## 2020-01-28 LAB — VITAMIN D 25 HYDROXY (VIT D DEFICIENCY, FRACTURES): Vit D, 25-Hydroxy: 104 ng/mL — ABNORMAL HIGH (ref 30.0–100.0)

## 2020-02-02 NOTE — Progress Notes (Signed)
Chief Complaint:   OBESITY Tamara Ball is here to discuss her progress with her obesity treatment plan along with follow-up of her obesity related diagnoses. Tamara Ball is on the Category 3 Plan and states she is following her eating plan approximately 75% of the time. Tamara Ball states she is exercising for 0 minutes 0 times per week.  Today's visit was #: 23 Starting weight: 205 lbs Starting date: 06/13/2018 Today's weight: 183 lbs Today's date: 01/27/2020 Total lbs lost to date: 22 lbs Total lbs lost since last in-office visit: 6 lbs  Interim History: Tamara Ball reports that going back on Saxenda and Wellbutrin helped her snack less.  She also just got Invisalign, which is decreasing her snacking as well.  Subjective:   1. Insulin resistance [name] has a diagnosis of insulin resistance based on her elevated fasting insulin level >5. She continues to work on diet and exercise to decrease her risk of diabetes.  She is taking [name].  She states that her cravings are better controlled, but she still craves french fries and chocolate cake intermittently.  Lab Results  Component Value Date   INSULIN 12.6 01/27/2020   INSULIN 9.1 06/17/2019   INSULIN 8.9 06/13/2018   Lab Results  Component Value Date   HGBA1C 5.1 01/27/2020   2. Vitamin D deficiency Tamara Ball's Vitamin D level was 53.6 on 06/17/2019.  She is currently taking no vitamin D supplement. She denies nausea, vomiting or muscle weakness.  No outside activities.   3. Other depression, with emotional eating  She is taking Wellbutrin.  No SI/HI.  Continues to have intermittent cravings.  4. At risk for hypoglycemia Early is at increased risk for hypoglycemia due to changes in diet, diagnosis of diabetes, and/or insulin use. Tamara Ball is currently taking insulin.   Assessment/Plan:   1. Insulin resistance Retha will continue to work on weight loss, exercise, and decreasing simple carbohydrates to help decrease the risk of diabetes. Jayel  agreed to follow-up with [name] as directed to closely monitor her progress.  - Hemoglobin A1c - Insulin, random  2. Vitamin D deficiency Low Vitamin D level contributes to fatigue and are associated with obesity, breast, and colon cancer.   - VITAMIN D 25 Hydroxy (Vit-D Deficiency, Fractures)  3. Other depression, with emotional eating  Behavior modification techniques were discussed today to help Tamara Ball deal with her emotional/non-hunger eating behaviors.  Orders and follow up as documented in patient record.   -Refill buPROPion (WELLBUTRIN SR) 200 MG 12 hr tablet; Take 1 tablet (200 mg total) by mouth daily.  Dispense: 30 tablet; Refill: 0  4. At risk for hypoglycemia Tamara Ball was given approximately 15 minutes of counseling today regarding prevention of hypoglycemia. She was advised of symptoms of hypoglycemia. Tamara Ball was instructed to avoid skipping meals, eat regular protein rich meals and schedule low calorie snacks as needed.   Repetitive spaced learning was employed today to elicit superior memory formation and behavioral change.  5. Class 1 obesity with serious comorbidity and body mass index (BMI) of 30.0 to 30.9 in adult, unspecified obesity type  - Liraglutide -Weight Management (SAXENDA) 18 MG/3ML SOPN; Inject 3 mg into the skin daily.  Dispense: 15 mL; Refill: 0  Tamara Ball is currently in the action stage of change. As such, her goal is to continue with weight loss efforts. She has agreed to the Category 3 Plan.   Exercise goals: For substantial health benefits, adults should do at least 150 minutes (2 hours and 30 minutes) a week  of moderate-intensity, or 75 minutes (1 hour and 15 minutes) a week of vigorous-intensity aerobic physical activity, or an equivalent combination of moderate- and vigorous-intensity aerobic activity. Aerobic activity should be performed in episodes of at least 10 minutes, and preferably, it should be spread throughout the week.  Behavioral modification  strategies: meal planning and cooking strategies and keeping healthy foods in the home.  Jennfier has agreed to follow-up with our clinic in 3 weeks. She was informed of the importance of frequent follow-up visits to maximize her success with intensive lifestyle modifications for her multiple health conditions.   Tamara Ball was informed we would discuss her lab results at her next visit unless there is a critical issue that needs to be addressed sooner. Meral agreed to keep her next visit at the agreed upon time to discuss these results.  Objective:   Blood pressure 108/72, pulse 82, temperature 98.2 F (36.8 C), height 5\' 6"  (1.676 m), weight 183 lb (83 kg), SpO2 99 %. Body mass index is 29.54 kg/m.  General: Cooperative, alert, well developed, in no acute distress. HEENT: Conjunctivae and lids unremarkable. Cardiovascular: Regular rhythm.  Lungs: Normal work of breathing. Neurologic: No focal deficits.   Lab Results  Component Value Date   CREATININE 0.98 01/27/2020   BUN 11 01/27/2020   NA 139 01/27/2020   K 4.4 01/27/2020   CL 101 01/27/2020   CO2 25 01/27/2020   Lab Results  Component Value Date   ALT 7 01/27/2020   AST 8 01/27/2020   ALKPHOS 61 01/27/2020   BILITOT 0.5 01/27/2020   Lab Results  Component Value Date   HGBA1C 5.1 01/27/2020   HGBA1C 4.9 06/17/2019   HGBA1C 5.0 05/27/2018   HGBA1C 5.1 05/22/2017   HGBA1C 5.1 05/16/2016   Lab Results  Component Value Date   INSULIN 12.6 01/27/2020   INSULIN 9.1 06/17/2019   INSULIN 8.9 06/13/2018   Lab Results  Component Value Date   TSH 2.250 06/13/2018   Lab Results  Component Value Date   CHOL 109 06/17/2019   HDL 49 06/17/2019   LDLCALC 46 06/17/2019   TRIG 64 06/17/2019   CHOLHDL 2 05/27/2018   Lab Results  Component Value Date   WBC 7.8 12/09/2019   HGB 14.7 12/09/2019   HCT 45.1 12/09/2019   MCV 91.9 12/09/2019   PLT 191 12/09/2019   Lab Results  Component Value Date   IRON 105 12/09/2019    TIBC 394 12/09/2019   FERRITIN 160 12/09/2019   Attestation Statements:   Reviewed by clinician on day of visit: allergies, medications, problem list, medical history, surgical history, family history, social history, and previous encounter notes.  I, Water quality scientist, CMA, am acting as transcriptionist for Abby Potash, PA-C  I have reviewed the above documentation for accuracy and completeness, and I agree with the above. Abby Potash, PA-C

## 2020-02-18 ENCOUNTER — Ambulatory Visit (INDEPENDENT_AMBULATORY_CARE_PROVIDER_SITE_OTHER): Payer: BC Managed Care – PPO | Admitting: Physician Assistant

## 2020-02-18 ENCOUNTER — Other Ambulatory Visit: Payer: Self-pay

## 2020-02-18 ENCOUNTER — Encounter (INDEPENDENT_AMBULATORY_CARE_PROVIDER_SITE_OTHER): Payer: Self-pay | Admitting: Physician Assistant

## 2020-02-18 VITALS — BP 106/72 | HR 73 | Temp 98.0°F | Ht 66.0 in | Wt 182.0 lb

## 2020-02-18 DIAGNOSIS — Z6830 Body mass index (BMI) 30.0-30.9, adult: Secondary | ICD-10-CM

## 2020-02-18 DIAGNOSIS — Z9189 Other specified personal risk factors, not elsewhere classified: Secondary | ICD-10-CM | POA: Diagnosis not present

## 2020-02-18 DIAGNOSIS — E669 Obesity, unspecified: Secondary | ICD-10-CM

## 2020-02-18 DIAGNOSIS — E559 Vitamin D deficiency, unspecified: Secondary | ICD-10-CM

## 2020-02-18 DIAGNOSIS — F3289 Other specified depressive episodes: Secondary | ICD-10-CM

## 2020-02-18 MED ORDER — BUPROPION HCL ER (SR) 200 MG PO TB12: 200.0000 mg | ORAL_TABLET | Freq: Every day | ORAL | 0 refills | Status: DC

## 2020-02-18 MED ORDER — BD PEN NEEDLE NANO 2ND GEN 32G X 4 MM MISC: 1.0000 | Freq: Every day | 0 refills | Status: DC

## 2020-02-18 MED ORDER — SAXENDA 18 MG/3ML ~~LOC~~ SOPN: 3.0000 mg | PEN_INJECTOR | Freq: Every day | SUBCUTANEOUS | 0 refills | Status: DC

## 2020-02-18 NOTE — Progress Notes (Signed)
Chief Complaint:   OBESITY Tamara Ball is here to discuss her progress with her obesity treatment plan along with follow-up of her obesity related diagnoses. Tamara Ball is on the Category 3 Plan and states she is following her eating plan approximately 75% of the time. Tamara Ball states she is exercising 0 minutes 0 times per week.  Today's visit was #: 24 Starting weight: 205 lbs Starting date: 06/13/2018 Today's weight: 182 lbs Today's date: 02/18/2020 Total lbs lost to date: 23 Total lbs lost since last in-office visit: 1  Interim History: Tamara Ball notes that she has been skipping breakfast due to getting up too late. She states that she is not eating enough daily. Her Invisalign deters her from snacking often.  Subjective:   Vitamin D deficiency. Last Vitamin D level was at goal. Tamara Ball is taking Vitamin D 10,000 units daily, which is not on her medication list.   Ref. Range 01/27/2020 08:44  Vitamin D, 25-Hydroxy Latest Ref Range: 30.0 - 100.0 ng/mL 104.0 (H)   Other depression, with emotional eating. Tamara Ball is struggling with emotional eating and using food for comfort to the extent that it is negatively impacting her health. She has been working on behavior modification techniques to help reduce her emotional eating and has been somewhat successful. She shows no sign of suicidal or homicidal ideations. Tamara Ball is on bupropion and states she is unsure whether or not it is helping with cravings.  At risk for osteoporosis. Tamara Ball is at higher risk of osteopenia and osteoporosis due to Vitamin D deficiency.   Assessment/Plan:   Vitamin D deficiency. Low Vitamin D level contributes to fatigue and are associated with obesity, breast, and colon cancer. She will decrease OTC Vitamin D to 10,000 units once weekly and will follow-up for routine testing of Vitamin D, at least 2-3 times per year to avoid over-replacement.  Other depression, with emotional eating. Behavior modification  techniques were discussed today to help Kellianne deal with her emotional/non-hunger eating behaviors.  Orders and follow up as documented in patient record. Refill was given for buPROPion (WELLBUTRIN SR) 200 MG 12 hr tablet #30 with 0 refills.  At risk for osteoporosis. Tamara Ball was given approximately 15 minutes of osteoporosis prevention counseling today. Tamara Ball is at risk for osteopenia and osteoporosis due to her Vitamin D deficiency. She was encouraged to take her Vitamin D and follow her higher calcium diet and increase strengthening exercise to help strengthen her bones and decrease her risk of osteopenia and osteoporosis.  Repetitive spaced learning was employed today to elicit superior memory formation and behavioral change.  Class 1 obesity with serious comorbidity and body mass index (BMI) of 30.0 to 30.9 in adult, unspecified obesity type - Starting BMI > 30. Refills were given for Liraglutide -Weight Management (SAXENDA) 18 MG/3ML SOPN #5 pens with 0 refills and Insulin Pen Needle (BD PEN NEEDLE NANO 2ND GEN) 32G X 4 MM MISC #100.  Tamara Ball is currently in the action stage of change. As such, her goal is to continue with weight loss efforts. She has agreed to the Category 3 Plan.   Exercise goals: For substantial health benefits, adults should do at least 150 minutes (2 hours and 30 minutes) a week of moderate-intensity, or 75 minutes (1 hour and 15 minutes) a week of vigorous-intensity aerobic physical activity, or an equivalent combination of moderate- and vigorous-intensity aerobic activity. Aerobic activity should be performed in episodes of at least 10 minutes, and preferably, it should be spread throughout  the week.  Behavioral modification strategies: meal planning and cooking strategies and keeping healthy foods in the home.  Tamara Ball has agreed to follow-up with our clinic in 3 weeks. She was informed of the importance of frequent follow-up visits to maximize her success with intensive  lifestyle modifications for her multiple health conditions.   Objective:   Blood pressure 106/72, pulse 73, temperature 98 F (36.7 C), height 5\' 6"  (1.676 m), weight 182 lb (82.6 kg), SpO2 99 %. Body mass index is 29.38 kg/m.  General: Cooperative, alert, well developed, in no acute distress. HEENT: Conjunctivae and lids unremarkable. Cardiovascular: Regular rhythm.  Lungs: Normal work of breathing. Neurologic: No focal deficits.   Lab Results  Component Value Date   CREATININE 0.98 01/27/2020   BUN 11 01/27/2020   NA 139 01/27/2020   K 4.4 01/27/2020   CL 101 01/27/2020   CO2 25 01/27/2020   Lab Results  Component Value Date   ALT 7 01/27/2020   AST 8 01/27/2020   ALKPHOS 61 01/27/2020   BILITOT 0.5 01/27/2020   Lab Results  Component Value Date   HGBA1C 5.1 01/27/2020   HGBA1C 4.9 06/17/2019   HGBA1C 5.0 05/27/2018   HGBA1C 5.1 05/22/2017   HGBA1C 5.1 05/16/2016   Lab Results  Component Value Date   INSULIN 12.6 01/27/2020   INSULIN 9.1 06/17/2019   INSULIN 8.9 06/13/2018   Lab Results  Component Value Date   TSH 2.250 06/13/2018   Lab Results  Component Value Date   CHOL 109 06/17/2019   HDL 49 06/17/2019   LDLCALC 46 06/17/2019   TRIG 64 06/17/2019   CHOLHDL 2 05/27/2018   Lab Results  Component Value Date   WBC 7.8 12/09/2019   HGB 14.7 12/09/2019   HCT 45.1 12/09/2019   MCV 91.9 12/09/2019   PLT 191 12/09/2019   Lab Results  Component Value Date   IRON 105 12/09/2019   TIBC 394 12/09/2019   FERRITIN 160 12/09/2019   Attestation Statements:   Reviewed by clinician on day of visit: allergies, medications, problem list, medical history, surgical history, family history, social history, and previous encounter notes.  IMichaelene Song, am acting as transcriptionist for Abby Potash, PA-C   I have reviewed the above documentation for accuracy and completeness, and I agree with the above. Abby Potash, PA-C

## 2020-02-25 ENCOUNTER — Ambulatory Visit: Payer: BC Managed Care – PPO | Admitting: Allergy

## 2020-03-08 ENCOUNTER — Ambulatory Visit (INDEPENDENT_AMBULATORY_CARE_PROVIDER_SITE_OTHER): Payer: BC Managed Care – PPO | Admitting: Physician Assistant

## 2020-03-09 ENCOUNTER — Encounter (INDEPENDENT_AMBULATORY_CARE_PROVIDER_SITE_OTHER): Payer: Self-pay

## 2020-03-21 ENCOUNTER — Encounter: Payer: Self-pay | Admitting: Family Medicine

## 2020-03-23 ENCOUNTER — Other Ambulatory Visit: Payer: Self-pay | Admitting: Family Medicine

## 2020-03-23 MED ORDER — DYMISTA 137-50 MCG/ACT NA SUSP
NASAL | 3 refills | Status: DC
Start: 2020-03-23 — End: 2022-09-07

## 2020-04-08 ENCOUNTER — Encounter: Payer: Self-pay | Admitting: Family Medicine

## 2020-04-08 ENCOUNTER — Other Ambulatory Visit: Payer: Self-pay | Admitting: Family Medicine

## 2020-04-09 MED ORDER — MONTELUKAST SODIUM 10 MG PO TABS
10.0000 mg | ORAL_TABLET | Freq: Every day | ORAL | 1 refills | Status: DC
Start: 2020-04-09 — End: 2020-11-09

## 2020-04-09 MED ORDER — LEVOCETIRIZINE DIHYDROCHLORIDE 5 MG PO TABS
5.0000 mg | ORAL_TABLET | Freq: Every evening | ORAL | 1 refills | Status: DC
Start: 2020-04-09 — End: 2020-11-09

## 2020-04-15 ENCOUNTER — Encounter: Payer: Self-pay | Admitting: Family Medicine

## 2020-04-15 ENCOUNTER — Other Ambulatory Visit: Payer: Self-pay

## 2020-04-15 ENCOUNTER — Telehealth (INDEPENDENT_AMBULATORY_CARE_PROVIDER_SITE_OTHER): Payer: BC Managed Care – PPO | Admitting: Family Medicine

## 2020-04-15 ENCOUNTER — Encounter (HOSPITAL_BASED_OUTPATIENT_CLINIC_OR_DEPARTMENT_OTHER): Payer: Self-pay | Admitting: Emergency Medicine

## 2020-04-15 ENCOUNTER — Emergency Department (HOSPITAL_BASED_OUTPATIENT_CLINIC_OR_DEPARTMENT_OTHER)
Admission: EM | Admit: 2020-04-15 | Discharge: 2020-04-16 | Disposition: A | Discharge status: Home / Self Care | Payer: BC Managed Care – PPO | Attending: Emergency Medicine | Admitting: Emergency Medicine

## 2020-04-15 VITALS — Temp 97.1°F

## 2020-04-15 DIAGNOSIS — K529 Noninfective gastroenteritis and colitis, unspecified: Secondary | ICD-10-CM | POA: Diagnosis not present

## 2020-04-15 DIAGNOSIS — R935 Abnormal findings on diagnostic imaging of other abdominal regions, including retroperitoneum: Secondary | ICD-10-CM | POA: Diagnosis not present

## 2020-04-15 DIAGNOSIS — R112 Nausea with vomiting, unspecified: Secondary | ICD-10-CM

## 2020-04-15 DIAGNOSIS — R9389 Abnormal findings on diagnostic imaging of other specified body structures: Secondary | ICD-10-CM

## 2020-04-15 DIAGNOSIS — R197 Diarrhea, unspecified: Secondary | ICD-10-CM | POA: Diagnosis not present

## 2020-04-15 DIAGNOSIS — Z20822 Contact with and (suspected) exposure to covid-19: Secondary | ICD-10-CM | POA: Diagnosis not present

## 2020-04-15 LAB — COMPREHENSIVE METABOLIC PANEL
ALT: 11 U/L (ref 0–44)
AST: 14 U/L — ABNORMAL LOW (ref 15–41)
Albumin: 4.2 g/dL (ref 3.5–5.0)
Alkaline Phosphatase: 56 U/L (ref 38–126)
Anion gap: 7 (ref 5–15)
BUN: 10 mg/dL (ref 6–20)
CO2: 26 mmol/L (ref 22–32)
Calcium: 9.4 mg/dL (ref 8.9–10.3)
Chloride: 104 mmol/L (ref 98–111)
Creatinine, Ser: 0.84 mg/dL (ref 0.44–1.00)
GFR, Estimated: >60 mL/min (ref >60)
Glucose, Bld: 92 mg/dL (ref 70–99)
Potassium: 3.9 mmol/L (ref 3.5–5.1)
Sodium: 137 mmol/L (ref 135–145)
Total Bilirubin: 0.7 mg/dL (ref 0.3–1.2)
Total Protein: 7.7 g/dL (ref 6.5–8.1)

## 2020-04-15 LAB — URINALYSIS, MICROSCOPIC (REFLEX)

## 2020-04-15 LAB — LIPASE, BLOOD: Lipase: 21 U/L (ref 11–51)

## 2020-04-15 LAB — CBC
HCT: 44.6 % (ref 36.0–46.0)
Hemoglobin: 14.6 g/dL (ref 12.0–15.0)
MCH: 29.6 pg (ref 26.0–34.0)
MCHC: 32.7 g/dL (ref 30.0–36.0)
MCV: 90.5 fL (ref 80.0–100.0)
Platelets: 228 10*3/uL (ref 150–400)
RBC: 4.93 MIL/uL (ref 3.87–5.11)
RDW: 12.8 % (ref 11.5–15.5)
WBC: 11.6 10*3/uL — ABNORMAL HIGH (ref 4.0–10.5)
nRBC: 0 % (ref 0.0–0.2)

## 2020-04-15 LAB — URINALYSIS, ROUTINE W REFLEX MICROSCOPIC
Glucose, UA: NEGATIVE mg/dL
Ketones, ur: >80 mg/dL — AB
Leukocytes,Ua: NEGATIVE
Nitrite: NEGATIVE
Protein, ur: NEGATIVE mg/dL
Specific Gravity, Urine: >1.03 — ABNORMAL HIGH (ref 1.005–1.030)
pH: 6 (ref 5.0–8.0)

## 2020-04-15 LAB — RESP PANEL BY RT-PCR (FLU A&B, COVID) ARPGX2
Influenza A by PCR: NEGATIVE
Influenza B by PCR: NEGATIVE
SARS Coronavirus 2 by RT PCR: NEGATIVE

## 2020-04-15 LAB — PREGNANCY, URINE: Preg Test, Ur: NEGATIVE

## 2020-04-15 MED ORDER — ONDANSETRON 4 MG PO TBDP
4.0000 mg | ORAL_TABLET | Freq: Once | ORAL | Status: AC
  Administered 2020-04-15: 4 mg via ORAL
  Filled 2020-04-15: qty 1

## 2020-04-15 MED ORDER — FAMOTIDINE IN NACL 20-0.9 MG/50ML-% IV SOLN
20.0000 mg | Freq: Once | INTRAVENOUS | Status: AC
  Administered 2020-04-15: 20 mg via INTRAVENOUS
  Filled 2020-04-15: qty 50

## 2020-04-15 MED ORDER — PROMETHAZINE HCL 25 MG/ML IJ SOLN
12.5000 mg | Freq: Once | INTRAMUSCULAR | Status: DC
  Filled 2020-04-15: qty 1

## 2020-04-15 MED ORDER — LACTATED RINGERS IV SOLN: INTRAVENOUS | Status: DC

## 2020-04-15 MED ORDER — MORPHINE SULFATE (PF) 4 MG/ML IV SOLN
4.0000 mg | Freq: Once | INTRAVENOUS | Status: DC
  Filled 2020-04-15: qty 1

## 2020-04-15 MED ORDER — LACTATED RINGERS IV BOLUS
2000.0000 mL | Freq: Once | INTRAVENOUS | Status: AC
  Administered 2020-04-15: 2000 mL via INTRAVENOUS

## 2020-04-15 MED ORDER — ONDANSETRON HCL 4 MG/2ML IJ SOLN
4.0000 mg | Freq: Once | INTRAMUSCULAR | Status: AC
  Administered 2020-04-15: 4 mg via INTRAVENOUS
  Filled 2020-04-15: qty 2

## 2020-04-15 NOTE — ED Triage Notes (Signed)
Patient presents with complaints of nausea and vomiting onset this am; states yellow emesis now with multiple episodes of diarrhea as well.

## 2020-04-15 NOTE — ED Provider Notes (Signed)
[facility] EMERGENC[facility] DEPARTMENT Provider Note   CSN: [id number] Arrival date & time: 04/15/20  1859     History Chief Complaint  Patient presents with  . Emesis    Tamara Ball is a 39 y.o. female.  HPI  Patient reports he awakened this morning at 6 AM and started getting some really uncomfortable cramping in her central abdomen.  She went to work and the symptoms progressed.  She started having vomiting and diarrhea at the same time.  This occurred multiple times.  She reports it continued throughout the day.  She then started having dry heaves.  She reports she has been trying to stay hydrated with clear liquids.  However she is still occasionally vomiting and bringing up a thin, dark-colored material.  She reports her last few bowel movements have also looked like there mostly bloody.  Patient reports there was a potluck dinner at work last night.  Reportedly, no one else is gotten similar symptoms.  She tried some Zofran she had at home but continued to have recurrent episodes of vomiting.  She reports she feels like she has gotten dehydrated.  She contacted her family doctor who recommended she come to the emergency department for further evaluation and hydration.     Past Medical History:  Diagnosis Date  . Anemia   . Anxiety   . Chicken pox   . Chronic headaches   . Constipation   . Fibroids   . Iron deficiency anemia due to chronic blood loss    from heavy periods  . Menorrhagia   . Palpitations     Patient Active Problem List   Diagnosis Date Noted  . Cough 12/02/2019  . Chest congestion 12/02/2019  . Migraine 09/19/2019  . Class 1 obesity with serious comorbidity and body mass index (BMI) of 33.0 to 33.9 in adult 06/17/2018  . Menorrhagia   . Iron deficiency anemia due to chronic blood loss   . Allergic rhinitis 02/05/2014    History reviewed. No pertinent surgical history.   OB History    Gravida  3   Para      Term      Preterm      AB       Living  1     SAB      IAB      Ectopic      Multiple      Live Births              Family History  Problem Relation Age of Onset  . Hypertension Mother   . Cancer Mother        skin cancer ?type but required radiation  . Healthy Maternal Grandmother   . Diabetes Maternal Grandfather   . Glaucoma Maternal Grandfather   . Hypertension Maternal Grandfather   . Hyperlipidemia Maternal Grandfather   . Gout Maternal Grandfather   . Hypertension Maternal Uncle   . Colon polyps Neg Hx   . Colon cancer Neg Hx   . Esophageal cancer Neg Hx     Social History   Tobacco Use  . Smoking status: Never Smoker  . Smokeless tobacco: Never Used  Vaping Use  . Vaping Use: Never used  Substance Use Topics  . Alcohol use: Yes    Alcohol/week: 0.0 standard drinks    Comment: social  . Drug use: No    Home Medications Prior to Admission medications   Medication Sig Start Date End Date Taking? Authorizing  Provider  albuterol (VENTOLIN HFA) 108 (90 Base) MCG/ACT inhaler Inhale 2 puffs into the lungs every 6 (six) hours as needed for wheezing or shortness of breath. 12/06/19   Caren Macadam, MD  AVIANE 0.1-20 MG-MCG tablet  09/19/16   [provider]  b complex vitamins tablet Take 1 tablet by mouth daily.    [provider]  baclofen (LIORESAL) 10 MG tablet Take 10 mg by mouth as needed for muscle spasms.    [provider]  Biotin 10000 MCG TABS Take 1 tablet by mouth daily.    [provider]  buPROPion (WELLBUTRIN SR) 200 MG 12 hr tablet Take 1 tablet (200 mg total) by mouth daily. 02/18/20   Abby Potash, PA-C  camphor-menthol [facility]) lotion Apply 1 application topically as needed for itching. 12/23/19   Tasia Catchings, Amy V, PA-C  diphenhydrAMINE HCl (BENADRYL PO) Take by mouth as needed.    [provider]  DYMISTA 137-50 MCG/ACT SUSP 1 actuation each nostril twice daily 03/23/20   Koberlein, Andris Flurry C, MD  eszopiclone (LUNESTA) 1  MG TABS tablet Take 1 tablet (1 mg total) by mouth at bedtime as needed for sleep. Take immediately before bedtime 11/11/19   Olalere, Adewale A, MD  fluconazole (DIFLUCAN) 150 MG tablet Take 1 tablet PO x1; repeat in 72 hours if symptoms are still present. 01/05/20   Caren Macadam, MD  Insulin Pen Needle (BD PEN NEEDLE NANO 2ND GEN) 32G X 4 MM MISC 1 Package by Does not apply route daily. 02/18/20   Abby Potash, PA-C  levocetirizine (XYZAL) 5 MG tablet Take 1 tablet (5 mg total) by mouth every evening. 04/09/20   Caren Macadam, MD  Liraglutide -Weight Management (SAXENDA) 18 MG/3ML SOPN Inject 3 mg into the skin daily. 02/18/20   Abby Potash, PA-C  montelukast (SINGULAIR) 10 MG tablet Take 1 tablet (10 mg total) by mouth at bedtime. 04/09/20   Caren Macadam, MD  Multiple Vitamin (MULTIVITAMIN WITH MINERALS) TABS tablet Take 1 tablet by mouth daily.    [provider]  Omega-3 Fatty Acids (FISH OIL) 1000 MG CAPS Take 1 capsule by mouth daily.    [provider]  ondansetron (ZOFRAN) 4 MG tablet Take 1 tablet (4 mg total) by mouth every 8 (eight) hours as needed for nausea or vomiting. 05/27/19   Abby Potash, PA-C  polyethylene glycol (MIRALAX / GLYCOLAX) 17 g packet Take 17 g by mouth daily.    [provider]  predniSONE (DELTASONE) 20 MG tablet Take 3 tabs daily x 2 days, then 2 tabs daily x 3 days, then 1 tab daily x 3 days, then 1/2 tab daily x 2 days 12/29/19   Caren Macadam, MD  VITAMIN D PO Take 10,000 Units by mouth once a week.    [provider]  zonisamide (ZONEGRAN) 50 MG capsule Take 150 mg by mouth daily. 08/27/19   [provider]    Allergies    Patient has no known allergies.  Review of Systems   Review of Systems 10 systems reviewed and negative except as per HPI Physical Exam Updated Vital Signs BP 123/85 (BP Location: Left Arm)   Pulse (!) 101   Temp 98.9 F (37.2 C) (Oral)   Resp 16   Ht 5\' 6"   (1.676 m)   Wt 82.6 kg   LMP 04/15/2020   SpO2 100%   BMI 29.39 kg/m   Physical Exam Constitutional:      Comments:  Alert and nontoxic.  Mental status clear  HENT:     Head: Normocephalic and atraumatic.     Mouth/Throat:     Pharynx: Oropharynx is clear.  Eyes:     Extraocular Movements: Extraocular movements intact.  Cardiovascular:     Rate and Rhythm: Normal rate and regular rhythm.  Pulmonary:     Effort: Pulmonary effort is normal.     Breath sounds: Normal breath sounds.  Abdominal:     Comments: Abdomen is soft.  All discomfort in epigastrium.  No guarding.  Lower abdomen nontender.  Musculoskeletal:        General: No swelling or tenderness. Normal range of motion.     Right lower leg: No edema.     Left lower leg: No edema.  Skin:    General: Skin is warm and dry.  Neurological:     General: No focal deficit present.     Mental Status: She is oriented to person, place, and time.     Coordination: Coordination normal.  Psychiatric:        Mood and Affect: Mood normal.     ED Results / Procedures / Treatments   Labs (all labs ordered are listed, but only abnormal results are displayed) Labs Reviewed  COMPREHENSIVE METABOLIC PANEL - Abnormal; Notable for the following components:      Result Value   AST 14 (*)    All other components within normal limits  CBC - Abnormal; Notable for the following components:   WBC 11.6 (*)    All other components within normal limits  URINALYSIS, ROUTINE W REFLEX MICROSCOPIC - Abnormal; Notable for the following components:   Specific Gravity, Urine >1.030 (*)    Hgb urine dipstick LARGE (*)    Bilirubin Urine SMALL (*)    Ketones, ur >80 (*)    All other components within normal limits  URINALYSIS, MICROSCOPIC (REFLEX) - Abnormal; Notable for the following components:   Bacteria, UA MANY (*)    All other components within normal limits  LIPASE, BLOOD  PREGNANCY, URINE    EKG None  Radiology No results  found.  Procedures Procedures (including critical care time)  Medications Ordered in ED Medications  ondansetron (ZOFRAN-ODT) disintegrating tablet 4 mg (4 mg Oral Given 04/15/20 1933)    ED Course  I have reviewed the triage vital signs and the nursing notes.  Pertinent labs & imaging results that were available during my care of the patient were reviewed by me and considered in my medical decision making (see chart for details).    MDM Rules/Calculators/A&P                          Recheck 23: 35 patient reports she feels very nauseated again and her abdomen feels uncomfortable.  She is currently getting second liter of fluids infused.  We will add a dose of Phenergan 12.5 mg and morphine 4 mg.  Patient presents with symptoms as outlined. By history, very suggestive of gastroenteritis. Possibly viral versus food associated. Clinically patient is not ill in appearance. She is very uncomfortable and urinalysis consistent with dehydration. As pain and persistent nausea continued despite antiemetics and hydration, will proceed with CT abdomen and continued symptomatic control and hydration. VS signs stable. Patient does not show signs of being septic or toxic. Dr. Florina Ou to follow-up on CT scan and final disposition based on clinical findings and further diagnostic imaging.  Final Clinical Impression(s) / ED Diagnoses  Final diagnoses:  Gastroenteritis  Abnormal finding on CT scan    Rx / DC Orders ED Discharge Orders    None       Charlesetta Shanks, MD 04/16/20 1424

## 2020-04-15 NOTE — Progress Notes (Signed)
Virtual Visit via Video Note  I connected with Tamara Ball  on 04/15/20 at  6:20 PM EST by a video enabled telemedicine application and verified that I am speaking with the correct person using two identifiers.  Location patient: home, [place] Location provider:work or home office Persons participating in the virtual visit: patient, provider  I discussed the limitations of evaluation and management by telemedicine and the availability of in person appointments. The patient expressed understanding and agreed to proceed.   HPI:  Acute telemedicine visit for nausea and vomiting: -Onset: today -Symptoms include: nausea, multiple episodes of vomiting, intermittent abd cramps, multiple episodes of watery diarrhea -finally vomiting subsided a little this evening -Denies: fevers, melena, hematochezia, bilious or bloody emesis, cough, congestion -fdlmp: on her period, has cramps with her period -COVID-19 vaccine status: fully vaccinated for covid19  ROS: See pertinent positives and negatives per HPI.  Past Medical History:  Diagnosis Date  . Anemia   . Anxiety   . Chicken pox   . Chronic headaches   . Constipation   . Fibroids   . Iron deficiency anemia due to chronic blood loss    from heavy periods  . Menorrhagia   . Palpitations     History reviewed. No pertinent surgical history.   Current Outpatient Medications:  .  albuterol (VENTOLIN HFA) 108 (90 Base) MCG/ACT inhaler, Inhale 2 puffs into the lungs every 6 (six) hours as needed for wheezing or shortness of breath., Disp: 18 g, Rfl: 1 .  AVIANE 0.1-20 MG-MCG tablet, , Disp: , Rfl:  .  b complex vitamins tablet, Take 1 tablet by mouth daily., Disp: , Rfl:  .  baclofen (LIORESAL) 10 MG tablet, Take 10 mg by mouth as needed for muscle spasms., Disp: , Rfl:  .  Biotin 10000 MCG TABS, Take 1 tablet by mouth daily., Disp: , Rfl:  .  buPROPion (WELLBUTRIN SR) 200 MG 12 hr tablet, Take 1 tablet (200 mg total) by mouth daily., Disp: 30  tablet, Rfl: 0 .  camphor-menthol (SARNA) lotion, Apply 1 application topically as needed for itching., Disp: 222 mL, Rfl: 0 .  diphenhydrAMINE HCl (BENADRYL PO), Take by mouth as needed., Disp: , Rfl:  .  DYMISTA 137-50 MCG/ACT SUSP, 1 actuation each nostril twice daily, Disp: 23 g, Rfl: 3 .  eszopiclone (LUNESTA) 1 MG TABS tablet, Take 1 tablet (1 mg total) by mouth at bedtime as needed for sleep. Take immediately before bedtime, Disp: 30 tablet, Rfl: 3 .  fluconazole (DIFLUCAN) 150 MG tablet, Take 1 tablet PO x1; repeat in 72 hours if symptoms are still present., Disp: 1 tablet, Rfl: 0 .  Insulin Pen Needle (BD PEN NEEDLE NANO 2ND GEN) 32G X 4 MM MISC, 1 Package by Does not apply route daily., Disp: 100 each, Rfl: 0 .  levocetirizine (XYZAL) 5 MG tablet, Take 1 tablet (5 mg total) by mouth every evening., Disp: 90 tablet, Rfl: 1 .  Liraglutide -Weight Management (SAXENDA) 18 MG/3ML SOPN, Inject 3 mg into the skin daily., Disp: 15 mL, Rfl: 0 .  montelukast (SINGULAIR) 10 MG tablet, Take 1 tablet (10 mg total) by mouth at bedtime., Disp: 90 tablet, Rfl: 1 .  Multiple Vitamin (MULTIVITAMIN WITH MINERALS) TABS tablet, Take 1 tablet by mouth daily., Disp: , Rfl:  .  Omega-3 Fatty Acids (FISH OIL) 1000 MG CAPS, Take 1 capsule by mouth daily., Disp: , Rfl:  .  ondansetron (ZOFRAN) 4 MG tablet, Take 1 tablet (4 mg total) by mouth  every 8 (eight) hours as needed for nausea or vomiting., Disp: 30 tablet, Rfl: 0 .  polyethylene glycol (MIRALAX / GLYCOLAX) 17 g packet, Take 17 g by mouth daily., Disp: , Rfl:  .  predniSONE (DELTASONE) 20 MG tablet, Take 3 tabs daily x 2 days, then 2 tabs daily x 3 days, then 1 tab daily x 3 days, then 1/2 tab daily x 2 days, Disp: 16 tablet, Rfl: 0 .  VITAMIN D PO, Take 10,000 Units by mouth once a week., Disp: , Rfl:  .  zonisamide (ZONEGRAN) 50 MG capsule, Take 150 mg by mouth daily., Disp: , Rfl:   EXAM:  VITALS per patient if applicable:  GENERAL: alert, oriented,  appears well and in no acute distress  HEENT: atraumatic, conjunttiva clear, no obvious abnormalities on inspection of external nose and ears  NECK: normal movements of the head and neck  LUNGS: on inspection no signs of respiratory distress, breathing rate appears normal, no obvious gross SOB, gasping or wheezing  CV: no obvious cyanosis  MS: moves all visible extremities without noticeable abnormality  PSYCH/NEURO: pleasant and cooperative, no obvious depression or anxiety, speech and thought processing grossly intact  ASSESSMENT AND PLAN:  Discussed the following assessment and plan:  Nausea and vomiting, intractability of vomiting not specified, unspecified vomiting type  Diarrhea, unspecified type  -we discussed possible serious and likely etiologies, options for evaluation and workup, limitations of telemedicine visit vs in person visit, treatment, treatment risks and precautions. Pt prefers to treat via telemedicine empirically rather than in person at this moment.  Query viral gastroenteritis most likely versus other.  She is also on her.  Currently, which makes things worse.  Did discuss possibility of COVID-19 breakthrough case as well, however she is not having any respiratory symptoms.  She opted for trial of Zofran, actually has some at home, Imodium and oral rehydration.  She agrees to seek prompt medical care if worsening, not improving, not able to tolerate fluids, feeling weak or has other concerns. Work/School slipped offered: provided in patient instructions   Scheduled follow up with PCP offered: She agrees to follow-up as needed. Advised to seek prompt in person care if worsening, new symptoms arise, or if is not improving with treatment. Discussed options for inperson care if PCP office not available. Did let this patient know that I only do telemedicine on Tuesdays and Thursdays for [place]. Advised to schedule follow up visit with PCP or [facility] if any further questions  or concerns to avoid delays in care.   I discussed the assessment and treatment plan with the patient. The patient was provided an opportunity to ask questions and all were answered. The patient agreed with the plan and demonstrated an understanding of the instructions.     [name], DO

## 2020-04-15 NOTE — Patient Instructions (Signed)
   ---------------------------------------------------------------------------------------------------------------------------      WORK SLIP:  Patient Tamara Ball,  1980/07/06, was seen for a medical visit today, 04/15/20 . Please excuse from work until symptoms have resolved at least 48-72 hours.  We also would advise a negative covid test before return to work.   Sincerely: E-signature: Dr. Colin Benton, DO [place] Ph: [phone]   ------------------------------------------------------------------------------------------------------------------------------   -zofran every 8 hours as needed for nausea and vomiting  -imodium for diarrhea per instructions as needed  -rehydrate with fluids as we discussed  I hope you are feeling better soon!  Seek in person care promptly if your symptoms worsen, new concerns arise, you continue to have vomiting and diarrhea, you are not able to keep down fluids or you are not improving with treatment.  It was nice to meet you today. I help [facility] out with telemedicine visits on Tuesdays and Thursdays and am available for visits on those days. If you have any concerns or questions following this visit please schedule a follow up visit with your Primary Care doctor or seek care at a local urgent care clinic to avoid delays in care.

## 2020-04-16 ENCOUNTER — Telehealth: Payer: Self-pay | Admitting: Family Medicine

## 2020-04-16 ENCOUNTER — Emergency Department (HOSPITAL_BASED_OUTPATIENT_CLINIC_OR_DEPARTMENT_OTHER): Payer: BC Managed Care – PPO

## 2020-04-16 MED ORDER — IOHEXOL 300 MG/ML  SOLN
100.0000 mL | Freq: Once | INTRAMUSCULAR | Status: AC | PRN
  Administered 2020-04-16: 100 mL via INTRAVENOUS

## 2020-04-16 MED ORDER — DICYCLOMINE HCL 20 MG PO TABS: 20.0000 mg | ORAL_TABLET | Freq: Four times a day (QID) | ORAL | 0 refills | Status: DC | PRN

## 2020-04-16 MED ORDER — DICYCLOMINE HCL 10 MG/ML IM SOLN
20.0000 mg | Freq: Once | INTRAMUSCULAR | Status: AC
  Administered 2020-04-16: 20 mg via INTRAMUSCULAR
  Filled 2020-04-16: qty 2

## 2020-04-16 MED ORDER — PROMETHAZINE HCL 25 MG PO TABS: 25.0000 mg | ORAL_TABLET | Freq: Four times a day (QID) | ORAL | 0 refills | Status: DC | PRN

## 2020-04-16 NOTE — Telephone Encounter (Signed)
Patient stated she still has abdominal pain, cramps and denies diarrhea at this time.  States she was given an injection, IV meds and Rxs prior to leaving the ER.  CT scan was performed and diagnosed with inflammation of her colon and liver lesions.  Message sent to PCP.

## 2020-04-16 NOTE — Telephone Encounter (Signed)
Patient had a mychart visit for abdominal pain and was advised to go to the ED and they found that her colon was inflamed. Pt stated that they wanted her to give a stool sample but was unable to do so and wanted to see if she can give one in office, please advise. CB is [phone]

## 2020-04-16 NOTE — ED Provider Notes (Signed)
Nursing notes and vitals signs, including pulse oximetry, reviewed.  Summary of this visit's results, reviewed by myself:  EKG:  EKG Interpretation  Date/Time:    Ventricular Rate:    PR Interval:    QRS Duration:   QT Interval:    QTC Calculation:   R Axis:     Text Interpretation:         Labs:  Results for orders placed or performed during the hospital encounter of 04/15/20 (from the past 24 hour(s))  Urinalysis, Routine w reflex microscopic Urine, Clean Catch     Status: Abnormal   Collection Time: 04/15/20  7:27 PM  Result Value Ref Range   Color, Urine YELLOW YELLOW   APPearance CLEAR CLEAR   Specific Gravity, Urine >1.030 (H) 1.005 - 1.030   pH 6.0 5.0 - 8.0   Glucose, UA NEGATIVE NEGATIVE mg/dL   Hgb urine dipstick LARGE (A) NEGATIVE   Bilirubin Urine SMALL (A) NEGATIVE   Ketones, ur >80 (A) NEGATIVE mg/dL   Protein, ur NEGATIVE NEGATIVE mg/dL   Nitrite NEGATIVE NEGATIVE   Leukocytes,Ua NEGATIVE NEGATIVE  Pregnancy, urine     Status: None   Collection Time: 04/15/20  7:27 PM  Result Value Ref Range   Preg Test, Ur NEGATIVE NEGATIVE  Urinalysis, Microscopic (reflex)     Status: Abnormal   Collection Time: 04/15/20  7:27 PM  Result Value Ref Range   RBC / HPF 11-20 0 - 5 RBC/hpf   WBC, UA 0-5 0 - 5 WBC/hpf   Bacteria, UA MANY (A) NONE SEEN   Squamous Epithelial / LPF 11-20 0 - 5   Mucus PRESENT   Lipase, blood     Status: None   Collection Time: 04/15/20  7:53 PM  Result Value Ref Range   Lipase 21 11 - 51 U/L  Comprehensive metabolic panel     Status: Abnormal   Collection Time: 04/15/20  7:53 PM  Result Value Ref Range   Sodium 137 135 - 145 mmol/L   Potassium 3.9 3.5 - 5.1 mmol/L   Chloride 104 98 - 111 mmol/L   CO2 26 22 - 32 mmol/L   Glucose, Bld 92 70 - 99 mg/dL   BUN 10 6 - 20 mg/dL   Creatinine, Ser 0.84 0.44 - 1.00 mg/dL   Calcium 9.4 8.9 - 10.3 mg/dL   Total Protein 7.7 6.5 - 8.1 g/dL   Albumin 4.2 3.5 - 5.0 g/dL   AST 14 (L) 15 - 41 U/L    ALT 11 0 - 44 U/L   Alkaline Phosphatase 56 38 - 126 U/L   Total Bilirubin 0.7 0.3 - 1.2 mg/dL   GFR, Estimated >60 >60 mL/min   Anion gap 7 5 - 15  CBC     Status: Abnormal   Collection Time: 04/15/20  7:53 PM  Result Value Ref Range   WBC 11.6 (H) 4.0 - 10.5 K/uL   RBC 4.93 3.87 - 5.11 MIL/uL   Hemoglobin 14.6 12.0 - 15.0 g/dL   HCT 44.6 36.0 - 46.0 %   MCV 90.5 80.0 - 100.0 fL   MCH 29.6 26.0 - 34.0 pg   MCHC 32.7 30.0 - 36.0 g/dL   RDW 12.8 11.5 - 15.5 %   Platelets 228 150 - 400 K/uL   nRBC 0.0 0.0 - 0.2 %  Resp Panel by RT-PCR (Flu A&B, Covid) Nasopharyngeal Swab     Status: None   Collection Time: 04/15/20 10:30 PM   Specimen: Nasopharyngeal Swab;  Nasopharyngeal(NP) swabs in vial transport medium  Result Value Ref Range   SARS Coronavirus 2 by RT PCR NEGATIVE NEGATIVE   Influenza A by PCR NEGATIVE NEGATIVE   Influenza B by PCR NEGATIVE NEGATIVE    Imaging Studies: CT Abdomen Pelvis W Contrast  Result Date: 04/16/2020 CLINICAL DATA:  Nausea and vomiting. EXAM: CT ABDOMEN AND PELVIS WITH CONTRAST TECHNIQUE: Multidetector CT imaging of the abdomen and pelvis was performed using the standard protocol following bolus administration of intravenous contrast. CONTRAST:  144mL OMNIPAQUE IOHEXOL 300 MG/ML  SOLN COMPARISON:  None. FINDINGS: Lower chest: The lung bases are clear. The heart size is normal. Hepatobiliary: Are indeterminate lesions in the right left hepatic lobes. In segment 4 there is a 2 2 cm hypoattenuating lesion (axial series 2, image 8). In hepatic segment 6/7 there is a heterogeneous 3.8 cm hypoattenuating lesion (axial series 2, image 15). There may be an additional hypoattenuating lesion in hepatic segment 3 measuring approximately 1.2 cm (axial series 2, image 20). Most of these lesions appear to trending toward I psoas 10 UA shin on the delayed images. The gallbladder is unremarkable. Pancreas: Normal contours without ductal dilatation. No peripancreatic fluid  collection. Spleen: Unremarkable. Adrenals/Urinary Tract: --Adrenal glands: Unremarkable. --Right kidney/ureter: No hydronephrosis or radiopaque kidney stones. --Left kidney/ureter: No hydronephrosis or radiopaque kidney stones. --Urinary bladder: Unremarkable. Stomach/Bowel: --Stomach/Duodenum: No hiatal hernia or other gastric abnormality. Normal duodenal course and caliber. --Small bowel: Unremarkable. --Colon: There is diffuse circumferential wall thickening of virtually all of the colon. There is adjacent fat stranding. --Appendix: Normal. Vascular/Lymphatic: Normal course and caliber of the major abdominal vessels. --No retroperitoneal lymphadenopathy. --No mesenteric lymphadenopathy. --No pelvic or inguinal lymphadenopathy. Reproductive: There are multiple uterine fibroids. Other: There is a small volume of pelvic free fluid which is likely physiologic. No free air. the abdominal wall is normal. Musculoskeletal. No acute displaced fractures. IMPRESSION: 1. Diffuse circumferential wall thickening of virtually all of the colon, with adjacent fat stranding, consistent with infectious or inflammatory colitis. 2. Multiple indeterminate hepatic lesions are noted. 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 A follow-up nonemergent outpatient contrast enhanced MRI is recommended. 3. Fibroid uterus. 4. Small volume of pelvic free fluid is likely physiologic. Electronically Signed   By: Constance Holster M.D.   On: 04/16/2020 00:34    1:25 AM Patient advised of CT findings showing colonic inflammation.  The patient did have a colonoscopy a year ago and has no known history of inflammatory bowel disease.  She was advised this is most likely infectious in etiology but she was unable to provide a stool sample for PCR testing.  She was advised that if symptoms persist she should follow-up with her gastroenterologist.  She was also advised of the liver  lesions, probably consistent with hemangiomas but we cannot rule out malignancy.  She will need outpatient follow-up through her PCP or gastroenterologist.  In the meantime we will treat her symptoms with Phenergan and Bentyl.  She is still having lower abdominal cramping although her nausea has improved.     Pura Picinich, Jenny Reichmann, MD 04/16/20 [id number]

## 2020-04-16 NOTE — Telephone Encounter (Signed)
Patient informed of the message below.

## 2020-04-16 NOTE — ED Notes (Signed)
Pt is aware of need for stool sample. Is attempting at this time

## 2020-04-16 NOTE — Telephone Encounter (Signed)
If she is not having diarrhea, then most of infectious stool studies cannot be run. I want her to call Dr. Tarri Glenn today for them to review the ER eval and give suggestion for follow up. I will try to send Dr. Tarri Glenn a message as well to give her a heads up.

## 2020-04-16 NOTE — Telephone Encounter (Signed)
Per prior note, Dr [name] connected with the pt and advised treatment.

## 2020-04-16 NOTE — Telephone Encounter (Signed)
Please see what symptoms she is still having? We can order some stool studies, but I would only do do if still having diarrhea.

## 2020-04-16 NOTE — Telephone Encounter (Signed)
Spoke with the pt-see prior phone note.

## 2020-04-20 ENCOUNTER — Telehealth: Payer: Self-pay

## 2020-04-20 NOTE — Telephone Encounter (Signed)
Pt aware.

## 2020-04-20 NOTE — Telephone Encounter (Signed)
Spoke with pt and she is not having any symptoms. Reports she had the symptoms and problems following a pot luck dinner at work. Pt thinks she either had a GI bug or food poisoning. Reports she called her PCP and was told to go to the ER for IV fluids. Then she states she had a big workup in the ER. She is not having any issues at present. She really did not feel she needed to have an OV. Please advise.

## 2020-04-20 NOTE — Telephone Encounter (Signed)
I am delighted to hear that she is feeling better. No office visit unless her symptoms recur. Thanks.

## 2020-04-20 NOTE — Telephone Encounter (Signed)
-----   Message from Thornton Park, MD sent at 04/19/2020  6:45 AM EST ----- Many thanks for your message.   Vaughan Basta, Would you please reach out to the patient for a symptom update and to schedule an office visit with me or an APP.  Thank you.  ----- Message ----- From: Caren Macadam, MD Sent: 04/16/2020  12:57 PM EST To: Thornton Park, MD  Hi Dr. Tarri Glenn - you have seen this patient before and I just advised her to call your office, so just wanted to give you a heads up. She was in ER last night with abdominal pain, diarrhea, vomiting. It looks like she has colitis of entire colon on CT scan. She was sent home and told to follow-up for stool studies, but she is no longer having diarrhea. She continues to have some lower abdominal discomfort. They did not send her home on any medication for her current symptoms, so I have asked her to touch base with you before we had into the weekend to see what you might recommend for follow-up.  She also had some abnormalities noted within the liver which they suggested follow-up imaging for, but I do not suspect these are part of her current issue.  We are going to have her call your office, just wanted you to be aware. The ER note is not finished, but their labs, imaging are completed.   Thanks for your help! Junell

## 2020-04-29 NOTE — Telephone Encounter (Signed)
I was reviewing her CT scan report from recent study performed in the ER. It mentions several indeterminate lesions in the liver. 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 Could proceed with MRI with and without contrast now followed by an office visit, or I could see her in the office and we discuss the findings and the recommendations for the MRI. Her preference.  Thank you.

## 2020-05-01 ENCOUNTER — Encounter (HOSPITAL_BASED_OUTPATIENT_CLINIC_OR_DEPARTMENT_OTHER): Payer: Self-pay | Admitting: Emergency Medicine

## 2020-05-01 ENCOUNTER — Emergency Department (HOSPITAL_BASED_OUTPATIENT_CLINIC_OR_DEPARTMENT_OTHER)
Admission: EM | Admit: 2020-05-01 | Discharge: 2020-05-01 | Disposition: A | Discharge status: Home / Self Care | Payer: BC Managed Care – PPO | Attending: Emergency Medicine | Admitting: Emergency Medicine

## 2020-05-01 ENCOUNTER — Other Ambulatory Visit: Payer: Self-pay

## 2020-05-01 DIAGNOSIS — R519 Headache, unspecified: Secondary | ICD-10-CM | POA: Diagnosis present

## 2020-05-01 DIAGNOSIS — Z794 Long term (current) use of insulin: Secondary | ICD-10-CM | POA: Diagnosis not present

## 2020-05-01 DIAGNOSIS — U071 COVID-19: Secondary | ICD-10-CM | POA: Diagnosis not present

## 2020-05-01 LAB — CBC WITH DIFFERENTIAL/PLATELET
Abs Immature Granulocytes: 0.03 10*3/uL (ref 0.00–0.07)
Basophils Absolute: 0 10*3/uL (ref 0.0–0.1)
Basophils Relative: 0 %
Eosinophils Absolute: 0.2 10*3/uL (ref 0.0–0.5)
Eosinophils Relative: 3 %
HCT: 42.4 % (ref 36.0–46.0)
Hemoglobin: 14.1 g/dL (ref 12.0–15.0)
Immature Granulocytes: 1 %
Lymphocytes Relative: 6 %
Lymphs Abs: 0.4 10*3/uL — ABNORMAL LOW (ref 0.7–4.0)
MCH: 30.1 pg (ref 26.0–34.0)
MCHC: 33.3 g/dL (ref 30.0–36.0)
MCV: 90.6 fL (ref 80.0–100.0)
Monocytes Absolute: 0.5 10*3/uL (ref 0.1–1.0)
Monocytes Relative: 8 %
Neutro Abs: 5.3 10*3/uL (ref 1.7–7.7)
Neutrophils Relative %: 82 %
Platelets: 165 10*3/uL (ref 150–400)
RBC: 4.68 MIL/uL (ref 3.87–5.11)
RDW: 13.2 % (ref 11.5–15.5)
WBC: 6.4 10*3/uL (ref 4.0–10.5)
nRBC: 0 % (ref 0.0–0.2)

## 2020-05-01 LAB — RESP PANEL BY RT-PCR (FLU A&B, COVID) ARPGX2
Influenza A by PCR: NEGATIVE
Influenza B by PCR: NEGATIVE
SARS Coronavirus 2 by RT PCR: POSITIVE — AB

## 2020-05-01 LAB — PREGNANCY, URINE: Preg Test, Ur: NEGATIVE

## 2020-05-01 LAB — BASIC METABOLIC PANEL
Anion gap: 10 (ref 5–15)
BUN: 13 mg/dL (ref 6–20)
CO2: 26 mmol/L (ref 22–32)
Calcium: 9 mg/dL (ref 8.9–10.3)
Chloride: 101 mmol/L (ref 98–111)
Creatinine, Ser: 0.98 mg/dL (ref 0.44–1.00)
GFR, Estimated: >60 mL/min (ref >60)
Glucose, Bld: 89 mg/dL (ref 70–99)
Potassium: 3.7 mmol/L (ref 3.5–5.1)
Sodium: 137 mmol/L (ref 135–145)

## 2020-05-01 LAB — URINALYSIS, ROUTINE W REFLEX MICROSCOPIC
Bilirubin Urine: NEGATIVE
Glucose, UA: NEGATIVE mg/dL
Hgb urine dipstick: NEGATIVE
Ketones, ur: NEGATIVE mg/dL
Leukocytes,Ua: NEGATIVE
Nitrite: NEGATIVE
Protein, ur: NEGATIVE mg/dL
Specific Gravity, Urine: 1.01 (ref 1.005–1.030)
pH: 7.5 (ref 5.0–8.0)

## 2020-05-01 MED ORDER — DEXAMETHASONE SODIUM PHOSPHATE 4 MG/ML IJ SOLN
4.0000 mg | Freq: Once | INTRAMUSCULAR | Status: AC
  Administered 2020-05-01: 22:00:00 4 mg via INTRAVENOUS
  Filled 2020-05-01: qty 1

## 2020-05-01 MED ORDER — METOCLOPRAMIDE HCL 5 MG/ML IJ SOLN
10.0000 mg | Freq: Once | INTRAMUSCULAR | Status: AC
  Administered 2020-05-01: 22:00:00 10 mg via INTRAVENOUS
  Filled 2020-05-01: qty 2

## 2020-05-01 MED ORDER — ACETAMINOPHEN 325 MG PO TABS
650.0000 mg | ORAL_TABLET | Freq: Once | ORAL | Status: AC
  Administered 2020-05-01: 22:00:00 650 mg via ORAL
  Filled 2020-05-01: qty 2

## 2020-05-01 MED ORDER — SODIUM CHLORIDE 0.9 % IV BOLUS
1000.0000 mL | Freq: Once | INTRAVENOUS | Status: AC
  Administered 2020-05-01: 22:00:00 1000 mL via INTRAVENOUS

## 2020-05-01 MED ORDER — DIPHENHYDRAMINE HCL 50 MG/ML IJ SOLN
25.0000 mg | Freq: Once | INTRAMUSCULAR | Status: AC
  Administered 2020-05-01: 22:00:00 25 mg via INTRAVENOUS
  Filled 2020-05-01: qty 1

## 2020-05-01 NOTE — ED Notes (Signed)
Dr Yao aware of positive covid result.  

## 2020-05-01 NOTE — ED Provider Notes (Signed)
[facility] EMERGENCY DEPARTMENT Provider Note   CSN: [id number] Arrival date & time: 05/01/20  2042     History Chief Complaint  Patient presents with  . Headache    Tamara Ball is a 39 y.o. female here presenting with headaches and chills.  Patient states that she has history of tension headaches and is on Zonegran at baseline.  She states that she has been having worsening headaches for the last 2 to 3 days.  She states that she was with her nephew about a week ago and her nephew had fevers and chills.  She she states that her nephew never got tested for Covid.  She also has a nonproductive cough as well.  She did receive the J&J vaccine however.  The history is provided by the patient.       Past Medical History:  Diagnosis Date  . Anemia   . Anxiety   . Chicken pox   . Chronic headaches   . Constipation   . Fibroids   . Iron deficiency anemia due to chronic blood loss    from heavy periods  . Menorrhagia   . Palpitations     Patient Active Problem List   Diagnosis Date Noted  . Cough 12/02/2019  . Chest congestion 12/02/2019  . Migraine 09/19/2019  . Class 1 obesity with serious comorbidity and body mass index (BMI) of 33.0 to 33.9 in adult 06/17/2018  . Menorrhagia   . Iron deficiency anemia due to chronic blood loss   . Allergic rhinitis 02/05/2014    History reviewed. No pertinent surgical history.   OB History    Gravida  3   Para      Term      Preterm      AB      Living  1     SAB      IAB      Ectopic      Multiple      Live Births              Family History  Problem Relation Age of Onset  . Hypertension Mother   . Cancer Mother        skin cancer ?type but required radiation  . Healthy Maternal Grandmother   . Diabetes Maternal Grandfather   . Glaucoma Maternal Grandfather   . Hypertension Maternal Grandfather   . Hyperlipidemia Maternal Grandfather   . Gout Maternal Grandfather   . Hypertension  Maternal Uncle   . Colon polyps Neg Hx   . Colon cancer Neg Hx   . Esophageal cancer Neg Hx     Social History   Tobacco Use  . Smoking status: Never Smoker  . Smokeless tobacco: Never Used  Vaping Use  . Vaping Use: Never used  Substance Use Topics  . Alcohol use: Yes    Alcohol/week: 0.0 standard drinks    Comment: social  . Drug use: No    Home Medications Prior to Admission medications   Medication Sig Start Date End Date Taking? Authorizing Provider  albuterol (VENTOLIN HFA) 108 (90 Base) MCG/ACT inhaler Inhale 2 puffs into the lungs every 6 (six) hours as needed for wheezing or shortness of breath. 12/06/19   Caren Macadam, MD  AVIANE 0.1-20 MG-MCG tablet  09/19/16   [provider]  b complex vitamins tablet Take 1 tablet by mouth daily.    [provider]  baclofen (LIORESAL) 10 MG tablet Take 10 mg by  mouth as needed for muscle spasms.    [provider]  Biotin 10000 MCG TABS Take 1 tablet by mouth daily.    [provider]  buPROPion (WELLBUTRIN SR) 200 MG 12 hr tablet Take 1 tablet (200 mg total) by mouth daily. 02/18/20   Abby Potash, PA-C  camphor-menthol [facility]) lotion Apply 1 application topically as needed for itching. 12/23/19   Tasia Catchings, Amy V, PA-C  dicyclomine (BENTYL) 20 MG tablet Take 1 tablet (20 mg total) by mouth 4 (four) times daily as needed (abdominal cramping). 04/16/20   Molpus, John, MD  diphenhydrAMINE HCl (BENADRYL PO) Take by mouth as needed.    [provider]  DYMISTA 137-50 MCG/ACT SUSP 1 actuation each nostril twice daily 03/23/20   Koberlein, Andris Flurry C, MD  eszopiclone (LUNESTA) 1 MG TABS tablet Take 1 tablet (1 mg total) by mouth at bedtime as needed for sleep. Take immediately before bedtime 11/11/19   Olalere, Adewale A, MD  fluconazole (DIFLUCAN) 150 MG tablet Take 1 tablet PO x1; repeat in 72 hours if symptoms are still present. 01/05/20   Caren Macadam, MD  Insulin Pen Needle (BD PEN  NEEDLE NANO 2ND GEN) 32G X 4 MM MISC 1 Package by Does not apply route daily. 02/18/20   Abby Potash, PA-C  levocetirizine (XYZAL) 5 MG tablet Take 1 tablet (5 mg total) by mouth every evening. 04/09/20   Caren Macadam, MD  Liraglutide -Weight Management (SAXENDA) 18 MG/3ML SOPN Inject 3 mg into the skin daily. 02/18/20   Abby Potash, PA-C  montelukast (SINGULAIR) 10 MG tablet Take 1 tablet (10 mg total) by mouth at bedtime. 04/09/20   Caren Macadam, MD  Multiple Vitamin (MULTIVITAMIN WITH MINERALS) TABS tablet Take 1 tablet by mouth daily.    [provider]  Omega-3 Fatty Acids (FISH OIL) 1000 MG CAPS Take 1 capsule by mouth daily.    [provider]  ondansetron (ZOFRAN) 4 MG tablet Take 1 tablet (4 mg total) by mouth every 8 (eight) hours as needed for nausea or vomiting. 05/27/19   Abby Potash, PA-C  polyethylene glycol (MIRALAX / GLYCOLAX) 17 g packet Take 17 g by mouth daily.    [provider]  predniSONE (DELTASONE) 20 MG tablet Take 3 tabs daily x 2 days, then 2 tabs daily x 3 days, then 1 tab daily x 3 days, then 1/2 tab daily x 2 days 12/29/19   Caren Macadam, MD  promethazine (PHENERGAN) 25 MG tablet Take 1 tablet (25 mg total) by mouth every 6 (six) hours as needed for nausea or vomiting. 04/16/20   Molpus, John, MD  VITAMIN D PO Take 10,000 Units by mouth once a week.    [provider]  zonisamide (ZONEGRAN) 50 MG capsule Take 150 mg by mouth daily. 08/27/19   [provider]    Allergies    Patient has no known allergies.  Review of Systems   Review of Systems  Neurological: Positive for headaches.  All other systems reviewed and are negative.   Physical Exam Updated Vital Signs BP 109/67 (BP Location: Left Arm)   Pulse 95   Temp 99.2 F (37.3 C) Comment: pt states this is normal for her  Resp 16   Ht 5\' 6"  (1.676 m)   Wt 81.6 kg   LMP 04/15/2020   SpO2 99%   BMI 29.05 kg/m   Physical  Exam Vitals and nursing note reviewed.  Constitutional:  Comments: Uncomfortable   HENT:     Head: Normocephalic.     Mouth/Throat:     Mouth: Mucous membranes are moist.  Eyes:     Extraocular Movements: Extraocular movements intact.     Pupils: Pupils are equal, round, and reactive to light.  Neck:     Comments: No meningeal signs  Cardiovascular:     Rate and Rhythm: Normal rate and regular rhythm.  Pulmonary:     Effort: Pulmonary effort is normal.     Breath sounds: Normal breath sounds.  Abdominal:     General: Bowel sounds are normal.     Palpations: Abdomen is soft.  Musculoskeletal:        General: Normal range of motion.     Cervical back: Normal range of motion and neck supple.  Skin:    General: Skin is warm.  Neurological:     Mental Status: She is alert and oriented to person, place, and time.     Cranial Nerves: No cranial nerve deficit or facial asymmetry.     Sensory: No sensory deficit.     Motor: No weakness.  Psychiatric:        Mood and Affect: Mood normal.        Behavior: Behavior normal.     ED Results / Procedures / Treatments   Labs (all labs ordered are listed, but only abnormal results are displayed) Labs Reviewed  RESP PANEL BY RT-PCR (FLU A&B, COVID) ARPGX2 - Abnormal; Notable for the following components:      Result Value   SARS Coronavirus 2 by RT PCR POSITIVE (*)    All other components within normal limits  URINALYSIS, ROUTINE W REFLEX MICROSCOPIC - Abnormal; Notable for the following components:   APPearance HAZY (*)    All other components within normal limits  CBC WITH DIFFERENTIAL/PLATELET - Abnormal; Notable for the following components:   Lymphs Abs 0.4 (*)    All other components within normal limits  PREGNANCY, URINE  BASIC METABOLIC PANEL    EKG None  Radiology No results found.  Procedures Procedures (including critical care time)  Medications Ordered in ED Medications  sodium chloride 0.9 % bolus 1,000  mL (1,000 mLs Intravenous New Bag/Given 05/01/20 2158)  metoCLOPramide (REGLAN) injection 10 mg (10 mg Intravenous Given 05/01/20 2200)  diphenhydrAMINE (BENADRYL) injection 25 mg (25 mg Intravenous Given 05/01/20 2200)  acetaminophen (TYLENOL) tablet 650 mg (650 mg Oral Given 05/01/20 2200)  dexamethasone (DECADRON) injection 4 mg (4 mg Intravenous Given 05/01/20 2201)    ED Course  I have reviewed the triage vital signs and the nursing notes.  Pertinent labs & imaging results that were available during my care of the patient were reviewed by me and considered in my medical decision making (see chart for details).    MDM Rules/Calculators/A&P                         Tamara Ball is a 39 y.o. female here with headache, chills. Low grade temp 99 in the ED. Nephew is sick recently. Likely worsening tension headaches vs covid vs viral syndrome. No meningeal signs. Will get cbc, bmp, UA. Will test for COVID and hydrate and reassess.   11:12 PM Labs unremarkable. COVID positive. Not hypoxic. Told her to quarantine for 10 days. Stable for discharge    Final Clinical Impression(s) / ED Diagnoses Final diagnoses:  None    Rx / DC Orders ED Discharge Orders  None       Drenda Freeze, MD 05/01/20 [phone]

## 2020-05-01 NOTE — Discharge Instructions (Signed)
Stay hydrated   Take tylenol for headaches. Continue your medicines for headaches   You have COVID and will need to stay home for 10 days   See your doctor   Return to ER if you have worse headaches, vomiting, fever, trouble breathing

## 2020-05-01 NOTE — ED Notes (Signed)
Onset of HA for the past 2 days, has been light headed and having body aches as well.

## 2020-05-01 NOTE — ED Triage Notes (Signed)
Reports hx of chronic HA/migraines, but no relief from meds at home. Also states "light cough x1 week", body aches and lightheadedness today.

## 2020-05-01 NOTE — ED Notes (Signed)
sr x 2 up, pt instructed not to get up off bed unless staff in room, call bell within reach, lights dimmed.

## 2020-05-01 NOTE — ED Notes (Signed)
Covid Swab obtained and to the lab 

## 2020-05-02 ENCOUNTER — Encounter: Payer: Self-pay | Admitting: Family Medicine

## 2020-05-03 NOTE — Telephone Encounter (Signed)
Spoke with pt and she is aware and is interested in just scheduling the MRI. Pt has covid, she will call back to schedule when she is better.

## 2020-05-05 ENCOUNTER — Other Ambulatory Visit: Payer: Self-pay

## 2020-05-05 ENCOUNTER — Ambulatory Visit (HOSPITAL_COMMUNITY)
Admission: RE | Admit: 2020-05-05 | Discharge: 2020-05-05 | Disposition: A | Discharge status: Home / Self Care | Payer: BC Managed Care – PPO | Source: Ambulatory Visit | Attending: Nurse Practitioner | Admitting: Nurse Practitioner

## 2020-05-05 ENCOUNTER — Telehealth (INDEPENDENT_AMBULATORY_CARE_PROVIDER_SITE_OTHER): Payer: BC Managed Care – PPO | Admitting: Nurse Practitioner

## 2020-05-05 ENCOUNTER — Telehealth: Payer: Self-pay | Admitting: Infectious Diseases

## 2020-05-05 ENCOUNTER — Other Ambulatory Visit: Payer: Self-pay | Admitting: Infectious Diseases

## 2020-05-05 DIAGNOSIS — J45909 Unspecified asthma, uncomplicated: Secondary | ICD-10-CM | POA: Diagnosis not present

## 2020-05-05 DIAGNOSIS — U071 COVID-19: Secondary | ICD-10-CM

## 2020-05-05 DIAGNOSIS — R0602 Shortness of breath: Secondary | ICD-10-CM

## 2020-05-05 DIAGNOSIS — E66811 Obesity, class 1: Secondary | ICD-10-CM

## 2020-05-05 DIAGNOSIS — Z6833 Body mass index (BMI) 33.0-33.9, adult: Secondary | ICD-10-CM | POA: Diagnosis not present

## 2020-05-05 DIAGNOSIS — E669 Obesity, unspecified: Secondary | ICD-10-CM | POA: Insufficient documentation

## 2020-05-05 MED ORDER — AZITHROMYCIN 250 MG PO TABS: ORAL_TABLET | ORAL | 0 refills | Status: DC

## 2020-05-05 MED ORDER — PREDNISONE 10 MG PO TABS: ORAL_TABLET | ORAL | 0 refills | Status: DC

## 2020-05-05 NOTE — Progress Notes (Signed)
Virtual Visit via Telephone Note  I connected with Tamara Ball on 05/05/20 at  2:30 PM EST by telephone and verified that I am speaking with the correct person using two identifiers.  Location: Patient: home Provider: office   I discussed the limitations, risks, security and privacy concerns of performing an evaluation and management service by telephone and the availability of in person appointments. I also discussed with the patient that there may be a patient responsible charge related to this service. The patient expressed understanding and agreed to proceed.   History of Present Illness:  39 year old female with history of migraine, allergic rhinitis, anemia, anxiety, obesity.  Patient presents today for televisit for post COVID care.  Patient was diagnosed with Covid on 05/01/2020.  She states that this is the same day her symptoms started.  Patient was seen in the ED.  She did not have any chest imaging done while in the ED.  Patient is concerned today because she is continuing to cough and experience chest pressure and shortness of breath at times.  She does have an inhaler that she can use as needed.  She states that she does not have a history of asthma, but has been prescribed an inhaler to use when allergy symptoms are bad.  Patient is requesting chest x-ray.  We discussed that there is a concern for Covid pneumonia and that we can order a chest x-ray for her.  Patient is also still inside the window of symptom onset receive monoclonal antibody infusion.  Infusion team has been notified we will schedule her for infusion tomorrow. Denies f/c/s, n/v/d, hemoptysis, PND, chest pain or edema.     Observations/Objective:   Sounds stable over the phone - alert and oriented     Assessment and Plan:  Covid 19 Cough Shortness of breath:   Stay well hydrated  Stay active  Deep breathing exercises  May take tylenol for fever or pain  May take mucinex twice daily  Will  order chest x ray  Message sent to be qualified for MAB  Will order azithromycin  Will order prednisone    Follow Up Instructions:  Follow up if needed     I discussed the assessment and treatment plan with the patient. The patient was provided an opportunity to ask questions and all were answered. The patient agreed with the plan and demonstrated an understanding of the instructions.   The patient was advised to call back or seek an in-person evaluation if the symptoms worsen or if the condition fails to improve as anticipated.  I provided 22 minutes of non-face-to-face time during this encounter.   Ivonne Andrew, NP

## 2020-05-05 NOTE — Patient Instructions (Signed)
Covid 19 Cough Shortness of breath:   Stay well hydrated  Stay active  Deep breathing exercises  May take tylenol for fever or pain  May take mucinex twice daily  Will order chest x ray  Message sent to be qualified for MAB  Will order azithromycin  Will order prednisone   Follow up:  Follow up if needed

## 2020-05-05 NOTE — Telephone Encounter (Signed)
Called to Discuss with patient about Covid symptoms and the use of the monoclonal antibody infusion for those with mild to moderate Covid symptoms and at a high risk of hospitalization.     Pt appears to qualify for this infusion due to co-morbid conditions and/or a member of an at-risk group in accordance with the FDA Emergency Use Authorization.    Symptom onset: 05/01/2020  Vaccinated: one dose in J&J in March  Qualified for Infusion: BMI 29, asthma history, concern for PNA.   Scheduled for 12/30. Information discussed and provided via mychart.    Rexene Alberts, MSN, NP-C [facility]  [email or web address] Pager: [phone] Office: [phone] [facility] Main Line: [phone]

## 2020-05-05 NOTE — Progress Notes (Signed)
I connected by phone with Tamara Ball on 05/05/2020 at 3:34 PM to discuss the potential use of a new treatment for mild to moderate COVID-19 viral infection in non-hospitalized patients.  This patient is a 39 y.o. female that meets the FDA criteria for Emergency Use Authorization of COVID monoclonal antibody casirivimab/imdevimab, bamlanivimab/etesevimab, or sotrovimab.  Has a (+) direct SARS-CoV-2 viral test result  Has mild or moderate COVID-19   Is NOT hospitalized due to COVID-19  Is within 10 days of symptom onset  Has at least one of the high risk factor(s) for progression to severe COVID-19 and/or hospitalization as defined in EUA.  Specific high risk criteria : BMI > 25 and Chronic Lung Disease   I have spoken and communicated the following to the patient or parent/caregiver regarding COVID monoclonal antibody treatment:  1. FDA has authorized the emergency use for the treatment of mild to moderate COVID-19 in adults and pediatric patients with positive results of direct SARS-CoV-2 viral testing who are 60 years of age and older weighing at least 40 kg, and who are at high risk for progressing to severe COVID-19 and/or hospitalization.  2. The significant known and potential risks and benefits of COVID monoclonal antibody, and the extent to which such potential risks and benefits are unknown.  3. Information on available alternative treatments and the risks and benefits of those alternatives, including clinical trials.  4. Patients treated with COVID monoclonal antibody should continue to self-isolate and use infection control measures (e.g., wear mask, isolate, social distance, avoid sharing personal items, clean and disinfect "high touch" surfaces, and frequent handwashing) according to CDC guidelines.   5. The patient or parent/caregiver has the option to accept or refuse COVID monoclonal antibody treatment.  After reviewing this information with the patient, the patient  has agreed to receive one of the available covid 19 monoclonal antibodies and will be provided an appropriate fact sheet prior to infusion. Rexene Alberts, NP 05/05/2020 3:34 PM

## 2020-05-06 ENCOUNTER — Telehealth: Payer: Self-pay | Admitting: Adult Health

## 2020-05-06 ENCOUNTER — Ambulatory Visit (HOSPITAL_COMMUNITY)
Admission: RE | Admit: 2020-05-06 | Discharge: 2020-05-06 | Disposition: A | Discharge status: Home / Self Care | Payer: BC Managed Care – PPO | Source: Ambulatory Visit | Attending: Pulmonary Disease | Admitting: Pulmonary Disease

## 2020-05-06 ENCOUNTER — Telehealth: Payer: Self-pay | Admitting: Nurse Practitioner

## 2020-05-06 DIAGNOSIS — U071 COVID-19: Secondary | ICD-10-CM | POA: Diagnosis not present

## 2020-05-06 DIAGNOSIS — E669 Obesity, unspecified: Secondary | ICD-10-CM

## 2020-05-06 DIAGNOSIS — J45909 Unspecified asthma, uncomplicated: Secondary | ICD-10-CM

## 2020-05-06 MED ORDER — EPINEPHRINE 0.3 MG/0.3ML IJ SOAJ: 0.3000 mg | Freq: Once | INTRAMUSCULAR | Status: DC | PRN

## 2020-05-06 MED ORDER — METHYLPREDNISOLONE SODIUM SUCC 125 MG IJ SOLR: 125.0000 mg | Freq: Once | INTRAMUSCULAR | Status: DC | PRN

## 2020-05-06 MED ORDER — DIPHENHYDRAMINE HCL 50 MG/ML IJ SOLN: 50.0000 mg | Freq: Once | INTRAMUSCULAR | Status: DC | PRN

## 2020-05-06 MED ORDER — FAMOTIDINE IN NACL 20-0.9 MG/50ML-% IV SOLN: 20.0000 mg | Freq: Once | INTRAVENOUS | Status: DC | PRN

## 2020-05-06 MED ORDER — ALBUTEROL SULFATE HFA 108 (90 BASE) MCG/ACT IN AERS: 2.0000 | INHALATION_SPRAY | Freq: Once | RESPIRATORY_TRACT | Status: DC | PRN

## 2020-05-06 MED ORDER — SODIUM CHLORIDE 0.9 % IV SOLN: Freq: Once | INTRAVENOUS | Status: AC

## 2020-05-06 MED ORDER — SODIUM CHLORIDE 0.9 % IV SOLN: INTRAVENOUS | Status: DC | PRN

## 2020-05-06 NOTE — Discharge Instructions (Signed)
10 Things You Can Do to Manage Your COVID-19 Symptoms at Home If you have possible or confirmed COVID-19: 1. Stay home from work and school. And stay away from other public places. If you must go out, avoid using any kind of public transportation, ridesharing, or taxis. 2. Monitor your symptoms carefully. If your symptoms get worse, call your healthcare provider immediately. 3. Get rest and stay hydrated. 4. If you have a medical appointment, call the healthcare provider ahead of time and tell them that you have or may have COVID-19. 5. For medical emergencies, call 911 and notify the dispatch personnel that you have or may have COVID-19. 6. Cover your cough and sneezes with a tissue or use the inside of your elbow. 7. Wash your hands often with soap and water for at least 20 seconds or clean your hands with an alcohol-based hand sanitizer that contains at least 60% alcohol. 8. As much as possible, stay in a specific room and away from other people in your home. Also, you should use a separate bathroom, if available. If you need to be around other people in or outside of the home, wear a mask. 9. Avoid sharing personal items with other people in your household, like dishes, towels, and bedding. 10. Clean all surfaces that are touched often, like counters, tabletops, and doorknobs. Use household cleaning sprays or wipes according to the label instructions. cdc.gov/coronavirus 11/06/2018 This information is not intended to replace advice given to you by your health care provider. Make sure you discuss any questions you have with your health care provider. Document Revised: 04/10/2019 Document Reviewed: 04/10/2019 Elsevier Patient Education  2020 Elsevier Inc. What types of side effects do monoclonal antibody drugs cause?  Common side effects  In general, the more common side effects caused by monoclonal antibody drugs include: . Allergic reactions, such as hives or itching . Flu-like signs and  symptoms, including chills, fatigue, fever, and muscle aches and pains . Nausea, vomiting . Diarrhea . Skin rashes . Low blood pressure   The CDC is recommending patients who receive monoclonal antibody treatments wait at least 90 days before being vaccinated.  Currently, there are no data on the safety and efficacy of mRNA COVID-19 vaccines in persons who received monoclonal antibodies or convalescent plasma as part of COVID-19 treatment. Based on the estimated half-life of such therapies as well as evidence suggesting that reinfection is uncommon in the 90 days after initial infection, vaccination should be deferred for at least 90 days, as a precautionary measure until additional information becomes available, to avoid interference of the antibody treatment with vaccine-induced immune responses. If you have any questions or concerns after the infusion please call the Advanced Practice Provider on call at [phone]. This number is ONLY intended for your use regarding questions or concerns about the infusion post-treatment side-effects.  Please do not provide this number to others for use. For return to work notes please contact your primary care provider.   If someone you know is interested in receiving treatment please have them call the COVID hotline at [phone].   

## 2020-05-06 NOTE — Telephone Encounter (Signed)
Patient notified of results, verbally understood. No additional questions.

## 2020-05-06 NOTE — Telephone Encounter (Signed)
Tamara Ball is calling about having chest tightness and back tightness after receiving treatment today with Regen-COV.  She is having no shortness of breath currently.  She notes that she has felt intermittent palpitations, and shortness of breath with this discomfort and has checked her pulse at 115.    I let [name] know that rarely Regen Cov can cause a cardiac arhythmia, and that there is no way for me to identify if this is what is happening with her unless she goes into the ER for evaluation. I recommended that she undergo ER evaluation.  She will do this.  I asked she call us back tomorrow with an update so that we can determine if a medwatch report should be filed.    Lillard Anes, NP

## 2020-05-06 NOTE — Telephone Encounter (Signed)
-----   Message from Ivonne Andrew, NP sent at 05/06/2020 10:07 AM EST ----- Please call to let patient know that her chest xray shows pneumonia. Please take entire course of medication that I prescribed yesterday. Thanks.

## 2020-05-06 NOTE — Progress Notes (Signed)
Patient reviewed Fact Sheet for Patients, Parents, and Caregivers for Emergency Use Authorization (EUA) of Regen-Cov for the Treatment of Coronavirus.  Patient also reviewed and is agreeable to the estimated cost of treatment.  Patient is agreeable to proceed.   

## 2020-05-06 NOTE — Progress Notes (Addendum)
  Diagnosis: COVID-19  Physician:  Dr. Patrick Wright  Procedure: Covid Infusion Clinic Med: casirivimab\imdevimab infusion - Provided patient with casirivimab\imdevimab fact sheet for patients, parents and caregivers prior to infusion.  Complications: No immediate complications noted.  Discharge: Discharged home   Tamara Ball 05/06/2020   

## 2020-05-24 ENCOUNTER — Encounter: Payer: Self-pay | Admitting: Family Medicine

## 2020-05-26 ENCOUNTER — Encounter: Payer: Self-pay | Admitting: Family Medicine

## 2020-05-26 ENCOUNTER — Ambulatory Visit: Payer: BC Managed Care – PPO | Admitting: Family Medicine

## 2020-05-26 ENCOUNTER — Other Ambulatory Visit: Payer: Self-pay

## 2020-05-26 VITALS — BP 126/80 | HR 74 | Ht 66.0 in | Wt 188.0 lb

## 2020-05-26 DIAGNOSIS — R42 Dizziness and giddiness: Secondary | ICD-10-CM

## 2020-05-26 NOTE — Progress Notes (Signed)
   Subjective:    Patient ID: Tamara Ball, female    DOB: Dec 08, 1980, 40 y.o.   MRN: [id number]  HPI Here for 5 days of intermittent dizziness which feels like the room is spinning, along with mild nausea. No vomiting. She has had a mild headache. She has migraines, but she says this is not a migraine. No fever or sinus congestion. She thinks this may be the result of recent changes in her vision prescriptions. She wears glasses in the mornings when she gets up, and then when she leaves to go to work she changes to contact lenses. Last week (for the first time in years) she saw her eye doctor and they changed hr prescriptions. She got contacts with the new prescription, but her glasses are still the old prescription. Therefore in the past few days she has been going back and forth from old to new, etc. She has been using Dramamine OTC and this has helped.    Review of Systems  Constitutional: Negative.   Eyes: Positive for visual disturbance.  Respiratory: Negative.   Cardiovascular: Negative.   Neurological: Positive for dizziness and headaches.       Objective:   Physical Exam Constitutional:      Appearance: Normal appearance. She is not ill-appearing.  HENT:     Head: Normocephalic and atraumatic.     Right Ear: Tympanic membrane, ear canal and external ear normal.     Left Ear: Tympanic membrane, ear canal and external ear normal.     Nose: Nose normal.     Mouth/Throat:     Pharynx: Oropharynx is clear.  Eyes:     Extraocular Movements: Extraocular movements intact.     Conjunctiva/sclera: Conjunctivae normal.     Pupils: Pupils are equal, round, and reactive to light.     Comments: No photophobia, no nystagmus   Cardiovascular:     Rate and Rhythm: Normal rate and regular rhythm.     Pulses: Normal pulses.     Heart sounds: Normal heart sounds.  Pulmonary:     Effort: Pulmonary effort is normal.     Breath sounds: Normal breath sounds.  Lymphadenopathy:     Cervical:  No cervical adenopathy.  Neurological:     General: No focal deficit present.     Mental Status: She is alert and oriented to person, place, and time.           Assessment & Plan:  Intermittent dizziness, which does not seem to be true vertigo. I agreed with her thayt this is likely he going back and forth between old and new prescriptions. She is scheduled to see her optometrist in 2 days to check this.  Alysia Penna, MD

## 2020-06-01 ENCOUNTER — Other Ambulatory Visit: Payer: Self-pay

## 2020-06-01 DIAGNOSIS — K769 Liver disease, unspecified: Secondary | ICD-10-CM

## 2020-06-01 NOTE — Telephone Encounter (Signed)
We have scheduled you for MR of abd at [facility] 06/08/20 at 5pm, you will need to arrive there in the xray department on the first floor at 4:30pm. You cannot have anything to eat or drink after 1pm.

## 2020-06-02 ENCOUNTER — Ambulatory Visit: Payer: BC Managed Care – PPO | Admitting: Nurse Practitioner

## 2020-06-02 ENCOUNTER — Telehealth: Payer: Self-pay

## 2020-06-02 ENCOUNTER — Ambulatory Visit
Admission: RE | Admit: 2020-06-02 | Discharge: 2020-06-02 | Disposition: A | Discharge status: Home / Self Care | Payer: BC Managed Care – PPO | Source: Ambulatory Visit | Attending: Nurse Practitioner | Admitting: Nurse Practitioner

## 2020-06-02 ENCOUNTER — Other Ambulatory Visit: Payer: Self-pay

## 2020-06-02 VITALS — BP 118/82 | HR 70 | Temp 97.1°F | Wt 184.0 lb

## 2020-06-02 DIAGNOSIS — J1282 Pneumonia due to coronavirus disease 2019: Secondary | ICD-10-CM

## 2020-06-02 DIAGNOSIS — U071 COVID-19: Secondary | ICD-10-CM

## 2020-06-02 NOTE — Progress Notes (Signed)
@Patient  ID: Tamara Ball, female    DOB: 1980-09-13, 40 y.o.   MRN: [id number]  Chief Complaint  Patient presents with  . Follow-up    1 month follow up, feeling better. Finishes azithromycin and prednisone from last OV    Referring provider: Caren Macadam, MD   40 year old female with history of migraine, allergic rhinitis, anemia, anxiety, obesity.  HPI  Patient presents today for post COVID care clinic visit/follow-up.  She was last seen in our office on 05/05/2020.  She did have chest x-ray completed that showed pneumonia.  Patient was prescribed azithromycin and prednisone.  Patient reports that she is doing much better now.  She feels like she is back to her baseline.  She will need repeat imaging today. Denies f/c/s, n/v/d, hemoptysis, PND, chest pain or edema.    No Known Allergies  Immunization History  Administered Date(s) Administered  . Influenza,inj,Quad PF,6+ Mos 03/16/2014, 03/28/2016, 05/22/2017, 05/27/2018  . Influenza-Unspecified 02/27/2019  . Janssen (J&J) SARS-COV-2 Vaccination 05/09/2019  . Tdap 03/16/2014    Past Medical History:  Diagnosis Date  . Anemia   . Anxiety   . Chicken pox   . Chronic headaches   . Constipation   . Fibroids   . Iron deficiency anemia due to chronic blood loss    from heavy periods  . Menorrhagia   . Palpitations     Tobacco History: Social History   Tobacco Use  Smoking Status Never Smoker  Smokeless Tobacco Never Used   Counseling given: Not Answered   Outpatient Encounter Medications as of 06/02/2020  Medication Sig  . albuterol (VENTOLIN HFA) 108 (90 Base) MCG/ACT inhaler Inhale 2 puffs into the lungs every 6 (six) hours as needed for wheezing or shortness of breath.  Vassie Moselle 0.1-20 MG-MCG tablet   . b complex vitamins tablet Take 1 tablet by mouth daily.  . baclofen (LIORESAL) 10 MG tablet Take 10 mg by mouth as needed for muscle spasms.  . Biotin 10000 MCG TABS Take 1 tablet by mouth daily.  Marland Kitchen  DYMISTA 137-50 MCG/ACT SUSP 1 actuation each nostril twice daily  . Insulin Pen Needle (BD PEN NEEDLE NANO 2ND GEN) 32G X 4 MM MISC 1 Package by Does not apply route daily.  Marland Kitchen levocetirizine (XYZAL) 5 MG tablet Take 1 tablet (5 mg total) by mouth every evening.  . Liraglutide -Weight Management (SAXENDA) 18 MG/3ML SOPN Inject 3 mg into the skin daily.  . montelukast (SINGULAIR) 10 MG tablet Take 1 tablet (10 mg total) by mouth at bedtime.  . Multiple Vitamin (MULTIVITAMIN WITH MINERALS) TABS tablet Take 1 tablet by mouth daily.  . Omega-3 Fatty Acids (FISH OIL) 1000 MG CAPS Take 1 capsule by mouth daily.  . ondansetron (ZOFRAN) 4 MG tablet Take 1 tablet (4 mg total) by mouth every 8 (eight) hours as needed for nausea or vomiting.  . promethazine (PHENERGAN) 25 MG tablet Take 1 tablet (25 mg total) by mouth every 6 (six) hours as needed for nausea or vomiting.  Marland Kitchen VITAMIN D PO Take 10,000 Units by mouth once a week.  . zonisamide (ZONEGRAN) 50 MG capsule Take 150 mg by mouth daily.  Marland Kitchen buPROPion (WELLBUTRIN SR) 200 MG 12 hr tablet Take 1 tablet (200 mg total) by mouth daily. (Patient not taking: No sig reported)  . camphor-menthol (SARNA) lotion Apply 1 application topically as needed for itching. (Patient not taking: Reported on 05/05/2020)  . dicyclomine (BENTYL) 20 MG tablet Take 1 tablet (20  mg total) by mouth 4 (four) times daily as needed (abdominal cramping). (Patient not taking: No sig reported)  . diphenhydrAMINE HCl (BENADRYL PO) Take by mouth as needed. (Patient not taking: No sig reported)  . eszopiclone (LUNESTA) 1 MG TABS tablet Take 1 tablet (1 mg total) by mouth at bedtime as needed for sleep. Take immediately before bedtime (Patient not taking: No sig reported)  . fluconazole (DIFLUCAN) 150 MG tablet Take 1 tablet PO x1; repeat in 72 hours if symptoms are still present. (Patient not taking: No sig reported)  . polyethylene glycol (MIRALAX / GLYCOLAX) 17 g packet Take 17 g by mouth  daily. (Patient not taking: No sig reported)  . predniSONE (DELTASONE) 10 MG tablet Take 4 tabs for 2 days, then 3 tabs for 2 days, then 2 tabs for 2 days, then 1 tab for 2 days, then stop  . [DISCONTINUED] azithromycin (ZITHROMAX) 250 MG tablet Take 2 tablets (500 mg) on day 1, then take 1 tablet (250 mg) on days 2-5 (Patient not taking: Reported on 05/26/2020)   No facility-administered encounter medications on file as of 06/02/2020.     Review of Systems  Review of Systems  Constitutional: Negative.  Negative for fatigue and fever.  HENT: Negative.   Respiratory: Negative for cough and shortness of breath.   Cardiovascular: Negative.  Negative for chest pain, palpitations and leg swelling.  Gastrointestinal: Negative.   Allergic/Immunologic: Negative.   Neurological: Negative.   Psychiatric/Behavioral: Negative.        Physical Exam  BP 118/82 (BP Location: Left Arm)   Pulse 70   Temp (!) 97.1 F (36.2 C)   Wt 184 lb 0.1 oz (83.5 kg)   SpO2 99%   BMI 29.70 kg/m   Wt Readings from Last 5 Encounters:  06/02/20 184 lb 0.1 oz (83.5 kg)  05/26/20 188 lb (85.3 kg)  05/01/20 180 lb (81.6 kg)  04/15/20 182 lb 1.6 oz (82.6 kg)  02/18/20 182 lb (82.6 kg)     Physical Exam Vitals and nursing note reviewed.  Constitutional:      General: She is not in acute distress.    Appearance: She is well-developed and well-nourished.  Cardiovascular:     Rate and Rhythm: Normal rate and regular rhythm.  Pulmonary:     Effort: Pulmonary effort is normal.     Breath sounds: Normal breath sounds.  Musculoskeletal:     Right lower leg: No edema.     Left lower leg: No edema.  Neurological:     Mental Status: She is alert and oriented to person, place, and time.  Psychiatric:        Mood and Affect: Mood and affect and mood normal.        Behavior: Behavior normal.      Lab Results:  CBC    Component Value Date/Time   WBC 6.4 05/01/2020 2148   RBC 4.68 05/01/2020 2148    HGB 14.1 05/01/2020 2148   HGB 14.7 12/09/2019 0820   HGB 14.2 04/20/2017 1524   HCT 42.4 05/01/2020 2148   HCT 43.2 04/20/2017 1524   PLT 165 05/01/2020 2148   PLT 191 12/09/2019 0820   PLT 199 04/20/2017 1524   MCV 90.6 05/01/2020 2148   MCV 90 04/20/2017 1524   MCH 30.1 05/01/2020 2148   MCHC 33.3 05/01/2020 2148   RDW 13.2 05/01/2020 2148   RDW 13.5 04/20/2017 1524   LYMPHSABS 0.4 (L) 05/01/2020 2148   LYMPHSABS 1.6 04/20/2017 1524  MONOABS 0.5 05/01/2020 2148   EOSABS 0.2 05/01/2020 2148   EOSABS 0.4 04/20/2017 1524   BASOSABS 0.0 05/01/2020 2148   BASOSABS 0.1 04/20/2017 1524    BMET    Component Value Date/Time   NA 137 05/01/2020 2148   NA 139 01/27/2020 0844   K 3.7 05/01/2020 2148   K 4.2 08/11/2016 1404   CL 101 05/01/2020 2148   CL 103 08/11/2016 1404   CO2 26 05/01/2020 2148   CO2 26 08/11/2016 1404   GLUCOSE 89 05/01/2020 2148   BUN 13 05/01/2020 2148   BUN 11 01/27/2020 0844   CREATININE 0.98 05/01/2020 2148   CREATININE 0.87 08/11/2016 1404   CALCIUM 9.0 05/01/2020 2148   CALCIUM 9.5 08/11/2016 1404   GFRNONAA >60 05/01/2020 2148   GFRAA 83 01/27/2020 0844   Imaging: DG Chest 2 View  Result Date: 05/06/2020 CLINICAL DATA:  COVID positive.  Cough.  Shortness of breath. EXAM: CHEST - 2 VIEW COMPARISON:  Two-view chest x-ray 12/02/2019 FINDINGS: Heart size is normal. Subtle bibasilar airspace opacities are present. Upper lung fields are clear. Visualized soft tissues and bony thorax are. IMPRESSION: Subtle bibasilar airspace disease concerning for early pneumonia in the setting of COVID-19. Electronically Signed   By: San Morelle M.D.   On: 05/06/2020 08:26     Assessment & Plan:   Pneumonia due to COVID-19 virus Cough:   Stay well hydrated  Stay active  Deep breathing exercises  May take tylenol or fever or pain  Will order chest x ray:  [facility] [address], [place] [address] [id number] MON - FRI  8:00 AM - 4:00 PM - WALK IN   Follow up:  Follow up in 2 weeks or sooner if needed      Fenton Foy, NP 06/02/2020

## 2020-06-02 NOTE — Assessment & Plan Note (Signed)
Cough:   Stay well hydrated  Stay active  Deep breathing exercises  May take tylenol or fever or pain  Will order chest x ray:  [facility] [address], [place] [address] [phone] MON - FRI 8:00 AM - 4:00 PM - WALK IN   Follow up:  Follow up in 2 weeks or sooner if needed

## 2020-06-02 NOTE — Patient Instructions (Signed)
Covid 19 Cough:   Stay well hydrated  Stay active  Deep breathing exercises  May take tylenol or fever or pain  Will order chest x ray:  [facility] [address], [place] [address] [phone] MON - FRI 8:00 AM - 4:00 PM - WALK IN   Follow up:  Follow up in 2 weeks or sooner if needed

## 2020-06-02 NOTE — Telephone Encounter (Signed)
Let message with results. Advised to call back with any questions or concerns.

## 2020-06-04 ENCOUNTER — Ambulatory Visit: Payer: BC Managed Care – PPO

## 2020-06-07 ENCOUNTER — Telehealth: Payer: Self-pay

## 2020-06-07 ENCOUNTER — Emergency Department (HOSPITAL_BASED_OUTPATIENT_CLINIC_OR_DEPARTMENT_OTHER): Payer: BC Managed Care – PPO

## 2020-06-07 ENCOUNTER — Other Ambulatory Visit: Payer: Self-pay

## 2020-06-07 ENCOUNTER — Encounter (HOSPITAL_BASED_OUTPATIENT_CLINIC_OR_DEPARTMENT_OTHER): Payer: Self-pay | Admitting: Emergency Medicine

## 2020-06-07 ENCOUNTER — Emergency Department (HOSPITAL_BASED_OUTPATIENT_CLINIC_OR_DEPARTMENT_OTHER)
Admission: EM | Admit: 2020-06-07 | Discharge: 2020-06-07 | Disposition: A | Discharge status: Home / Self Care | Payer: BC Managed Care – PPO | Attending: Emergency Medicine | Admitting: Emergency Medicine

## 2020-06-07 DIAGNOSIS — Z8616 Personal history of COVID-19: Secondary | ICD-10-CM | POA: Diagnosis not present

## 2020-06-07 DIAGNOSIS — R5383 Other fatigue: Secondary | ICD-10-CM | POA: Diagnosis not present

## 2020-06-07 DIAGNOSIS — R002 Palpitations: Secondary | ICD-10-CM | POA: Diagnosis not present

## 2020-06-07 DIAGNOSIS — R0789 Other chest pain: Secondary | ICD-10-CM | POA: Insufficient documentation

## 2020-06-07 DIAGNOSIS — R079 Chest pain, unspecified: Secondary | ICD-10-CM

## 2020-06-07 DIAGNOSIS — R0602 Shortness of breath: Secondary | ICD-10-CM | POA: Diagnosis not present

## 2020-06-07 LAB — CBC
HCT: 44.6 % (ref 36.0–46.0)
Hemoglobin: 14.5 g/dL (ref 12.0–15.0)
MCH: 29.7 pg (ref 26.0–34.0)
MCHC: 32.5 g/dL (ref 30.0–36.0)
MCV: 91.2 fL (ref 80.0–100.0)
Platelets: 227 10*3/uL (ref 150–400)
RBC: 4.89 MIL/uL (ref 3.87–5.11)
RDW: 13.5 % (ref 11.5–15.5)
WBC: 10.6 10*3/uL — ABNORMAL HIGH (ref 4.0–10.5)
nRBC: 0 % (ref 0.0–0.2)

## 2020-06-07 LAB — BASIC METABOLIC PANEL
Anion gap: 8 (ref 5–15)
BUN: 12 mg/dL (ref 6–20)
CO2: 28 mmol/L (ref 22–32)
Calcium: 9.2 mg/dL (ref 8.9–10.3)
Chloride: 103 mmol/L (ref 98–111)
Creatinine, Ser: 0.89 mg/dL (ref 0.44–1.00)
GFR, Estimated: >60 mL/min (ref >60)
Glucose, Bld: 94 mg/dL (ref 70–99)
Potassium: 3.5 mmol/L (ref 3.5–5.1)
Sodium: 139 mmol/L (ref 135–145)

## 2020-06-07 LAB — FERRITIN: Ferritin: 46 ng/mL (ref 11–307)

## 2020-06-07 LAB — D-DIMER, QUANTITATIVE: D-Dimer, Quant: 0.34 ug/mL-FEU (ref 0.00–0.50)

## 2020-06-07 LAB — TROPONIN I (HIGH SENSITIVITY): Troponin I (High Sensitivity): <2 ng/L (ref <18)

## 2020-06-07 NOTE — Telephone Encounter (Signed)
Pt called in to make an appt with sarah, I also sent a message to the desk nurse with pt questions

## 2020-06-07 NOTE — ED Provider Notes (Signed)
[facility] DEPARTMENT Provider Note   CSN: [id number] Arrival date & time: 06/07/20  0856     History Chief Complaint  Patient presents with  . Chest Pain    Tamara Ball is a 40 y.o. female with pertinent past medical history of anemia, anxiety, palpitations, recent COVID-19 diagnosed May 01 2020 that presents emergency department today for chest pain.  Patient states that she has had chest pain since Friday, has been intermittent under her left breast.  Does not radiate anywhere.  States that chest pain is mild and tolerable, states that she thinks that this is due to her hemoglobin level. States that she sometimes has shortness of breath and fatigue as well.  Patient states that this started on Friday.  States that she had resolution of her COVID-19 symptoms 2 weeks after she was diagnosed and then started having these symptoms about 5 days ago.  Patient had a chest x-ray with her PCP, there was resolution of COVID-19 pneumonia.  Patient states that she called her hematologist this morning to get her hemoglobin and ferritin levels checked, they told her to go to her primary care office.  Primary care office told her to come here.  Patient states that she primarily came here to get these levels checked.  States that she normally gets iron infusions, last one was last year.  States that she does not take iron supplement daily.  Patient also complains of palpitations, are also intermittent.  States that she sometimes has pain down into her left arm and left leg, denies any numbness or tingling.  Patient states that she is generally healthy.  Denies any history of blood clot, however is concerned about this today.  Denies any cough, fever, nausea, vomiting.  Patient states that she is eating and drinking and urinating normally. No other complaints. HPI     Past Medical History:  Diagnosis Date  . Anemia   . Anxiety   . Chicken pox   . Chronic headaches   .  Constipation   . Fibroids   . Iron deficiency anemia due to chronic blood loss    from heavy periods  . Menorrhagia   . Palpitations     Patient Active Problem List   Diagnosis Date Noted  . Pneumonia due to COVID-19 virus 06/02/2020  . Cough 12/02/2019  . Chest congestion 12/02/2019  . Migraine 09/19/2019  . Class 1 obesity with serious comorbidity and body mass index (BMI) of 33.0 to 33.9 in adult 06/17/2018  . Menorrhagia   . Iron deficiency anemia due to chronic blood loss   . Allergic rhinitis 02/05/2014    History reviewed. No pertinent surgical history.   OB History    Gravida  3   Para      Term      Preterm      AB      Living  1     SAB      IAB      Ectopic      Multiple      Live Births              Family History  Problem Relation Age of Onset  . Hypertension Mother   . Cancer Mother        skin cancer ?type but required radiation  . Healthy Maternal Grandmother   . Diabetes Maternal Grandfather   . Glaucoma Maternal Grandfather   . Hypertension Maternal Grandfather   . Hyperlipidemia  Maternal Grandfather   . Gout Maternal Grandfather   . Hypertension Maternal Uncle   . Colon polyps Neg Hx   . Colon cancer Neg Hx   . Esophageal cancer Neg Hx     Social History   Tobacco Use  . Smoking status: Never Smoker  . Smokeless tobacco: Never Used  Vaping Use  . Vaping Use: Never used  Substance Use Topics  . Alcohol use: Yes    Alcohol/week: 0.0 standard drinks    Comment: social  . Drug use: No    Home Medications Prior to Admission medications   Medication Sig Start Date End Date Taking? Authorizing Provider  albuterol (VENTOLIN HFA) 108 (90 Base) MCG/ACT inhaler Inhale 2 puffs into the lungs every 6 (six) hours as needed for wheezing or shortness of breath. 12/06/19   Caren Macadam, MD  AVIANE 0.1-20 MG-MCG tablet  09/19/16   [provider]  b complex vitamins tablet Take 1 tablet by mouth daily.    [provider]  baclofen (LIORESAL) 10 MG tablet Take 10 mg by mouth as needed for muscle spasms.    [provider]  Biotin 10000 MCG TABS Take 1 tablet by mouth daily.    [provider]  buPROPion (WELLBUTRIN SR) 200 MG 12 hr tablet Take 1 tablet (200 mg total) by mouth daily. Patient not taking: No sig reported 02/18/20   Abby Potash, PA-C  camphor-menthol [facility]) lotion Apply 1 application topically as needed for itching. Patient not taking: Reported on 05/05/2020 12/23/19   Ok Edwards, PA-C  dicyclomine (BENTYL) 20 MG tablet Take 1 tablet (20 mg total) by mouth 4 (four) times daily as needed (abdominal cramping). Patient not taking: No sig reported 04/16/20   Molpus, John, MD  diphenhydrAMINE HCl (BENADRYL PO) Take by mouth as needed. Patient not taking: No sig reported    [provider]  DYMISTA 137-50 MCG/ACT SUSP 1 actuation each nostril twice daily 03/23/20   Koberlein, Steele Berg, MD  eszopiclone (LUNESTA) 1 MG TABS tablet Take 1 tablet (1 mg total) by mouth at bedtime as needed for sleep. Take immediately before bedtime Patient not taking: No sig reported 11/11/19   Sherrilyn Rist A, MD  fluconazole (DIFLUCAN) 150 MG tablet Take 1 tablet PO x1; repeat in 72 hours if symptoms are still present. Patient not taking: No sig reported 01/05/20   Caren Macadam, MD  Insulin Pen Needle (BD PEN NEEDLE NANO 2ND GEN) 32G X 4 MM MISC 1 Package by Does not apply route daily. 02/18/20   Abby Potash, PA-C  levocetirizine (XYZAL) 5 MG tablet Take 1 tablet (5 mg total) by mouth every evening. 04/09/20   Caren Macadam, MD  Liraglutide -Weight Management (SAXENDA) 18 MG/3ML SOPN Inject 3 mg into the skin daily. 02/18/20   Abby Potash, PA-C  montelukast (SINGULAIR) 10 MG tablet Take 1 tablet (10 mg total) by mouth at bedtime. 04/09/20   Caren Macadam, MD  Multiple Vitamin (MULTIVITAMIN WITH MINERALS) TABS tablet Take 1 tablet by mouth daily.    [provider]  Omega-3 Fatty Acids (FISH OIL) 1000 MG CAPS Take 1 capsule by mouth daily.    [provider]  ondansetron (ZOFRAN) 4 MG tablet Take 1 tablet (4 mg total) by mouth every 8 (eight) hours as needed for nausea or vomiting. 05/27/19   Abby Potash, PA-C  polyethylene glycol (MIRALAX / GLYCOLAX) 17 g packet Take 17 g by mouth daily. Patient not  taking: No sig reported    [provider]  predniSONE (DELTASONE) 10 MG tablet Take 4 tabs for 2 days, then 3 tabs for 2 days, then 2 tabs for 2 days, then 1 tab for 2 days, then stop 05/05/20   Fenton Foy, NP  promethazine (PHENERGAN) 25 MG tablet Take 1 tablet (25 mg total) by mouth every 6 (six) hours as needed for nausea or vomiting. 04/16/20   Molpus, John, MD  VITAMIN D PO Take 10,000 Units by mouth once a week.    [provider]  zonisamide (ZONEGRAN) 50 MG capsule Take 150 mg by mouth daily. 08/27/19   [provider]    Allergies    Patient has no known allergies.  Review of Systems   Review of Systems  Constitutional: Negative for chills, diaphoresis, fatigue and fever.  HENT: Negative for congestion, sore throat and trouble swallowing.   Eyes: Negative for pain and visual disturbance.  Respiratory: Positive for shortness of breath. Negative for cough and wheezing.   Cardiovascular: Positive for chest pain and palpitations. Negative for leg swelling.  Gastrointestinal: Negative for abdominal distention, abdominal pain, diarrhea, nausea and vomiting.  Genitourinary: Negative for difficulty urinating.  Musculoskeletal: Negative for back pain, neck pain and neck stiffness.  Skin: Negative for pallor.  Neurological: Negative for dizziness, speech difficulty, weakness and headaches.  Psychiatric/Behavioral: Negative for confusion.    Physical Exam Updated Vital Signs BP 128/75 (BP Location: Right Arm)   Pulse 72   Temp 98 F (36.7 C) (Oral)   Resp 17   Ht 5\' 6"  (1.676 m)   Wt  83.9 kg   SpO2 100%   BMI 29.86 kg/m   Physical Exam Constitutional:      General: She is not in acute distress.    Appearance: Normal appearance. She is not ill-appearing, toxic-appearing or diaphoretic.     Comments: Patient without acute respiratory stress.  Patient is sitting comfortably in bed, no tripoding, use of accessory muscles.  Patient is speaking to me in full sentences.  Handling secretions well.  HENT:     Head: Normocephalic and atraumatic.     Jaw: There is normal jaw occlusion. No trismus, swelling or malocclusion.     Nose: No congestion or rhinorrhea.     Right Sinus: No maxillary sinus tenderness or frontal sinus tenderness.     Left Sinus: No maxillary sinus tenderness or frontal sinus tenderness.     Mouth/Throat:     Mouth: Mucous membranes are moist. No oral lesions.     Dentition: Normal dentition.     Tongue: No lesions.     Palate: No mass and lesions.     Pharynx: Oropharynx is clear. Uvula midline. No pharyngeal swelling, oropharyngeal exudate, posterior oropharyngeal erythema or uvula swelling.     Tonsils: No tonsillar exudate or tonsillar abscesses. 1+ on the right. 1+ on the left.  Eyes:     General: No visual field deficit.       Right eye: No discharge.        Left eye: No discharge.     Extraocular Movements: Extraocular movements intact.     Conjunctiva/sclera: Conjunctivae normal.     Pupils: Pupils are equal, round, and reactive to light.  Cardiovascular:     Rate and Rhythm: Normal rate and regular rhythm.     Pulses: Normal pulses.     Heart sounds: Normal heart sounds. No murmur heard. No friction rub. No gallop.   Pulmonary:  Effort: Pulmonary effort is normal. No respiratory distress.     Breath sounds: Normal breath sounds. No stridor. No wheezing, rhonchi or rales.  Chest:     Chest wall: No tenderness.  Abdominal:     General: Abdomen is flat. Bowel sounds are normal. There is no distension.     Palpations: Abdomen is soft.      Tenderness: There is no abdominal tenderness. There is no right CVA tenderness or left CVA tenderness.  Musculoskeletal:        General: No swelling or tenderness. Normal range of motion.     Cervical back: Normal range of motion. No rigidity or tenderness.     Right lower leg: No edema.     Left lower leg: No edema.  Lymphadenopathy:     Cervical: No cervical adenopathy.  Skin:    General: Skin is warm and dry.     Capillary Refill: Capillary refill takes less than 2 seconds.     Findings: No erythema or rash.  Neurological:     General: No focal deficit present.     Mental Status: She is alert and oriented to person, place, and time.     Cranial Nerves: Cranial nerves are intact. No cranial nerve deficit or facial asymmetry.     Motor: Motor function is intact. No weakness.     Coordination: Coordination is intact.     Gait: Gait is intact. Gait normal.  Psychiatric:        Mood and Affect: Mood normal.     ED Results / Procedures / Treatments   Labs (all labs ordered are listed, but only abnormal results are displayed) Labs Reviewed  CBC - Abnormal; Notable for the following components:      Result Value   WBC 10.6 (*)    All other components within normal limits  BASIC METABOLIC PANEL  D-DIMER, QUANTITATIVE (NOT AT [facility])  FERRITIN  TROPONIN I (HIGH SENSITIVITY)    EKG EKG Interpretation  Date/Time:  Monday June 07 2020 09:00:31 EST Ventricular Rate:  76 PR Interval:  146 QRS Duration: 72 QT Interval:  348 QTC Calculation: 391 R Axis:   77 Text Interpretation: Normal sinus rhythm Normal ECG Confirmed by Aletta Edouard [phone]) on 06/07/2020 9:15:21 AM   Radiology DG Chest 2 View  Result Date: 06/07/2020 CLINICAL DATA:  Chest pain, palpitations, fatigue, and left upper extremity numbness and tingling for 5 days. EXAM: CHEST - 2 VIEW COMPARISON:  06/02/2020 FINDINGS: The cardiomediastinal silhouette is unchanged with normal heart size. No confluent  airspace opacity, edema, pleural effusion, pneumothorax is identified. No acute osseous abnormality is seen. IMPRESSION: No active cardiopulmonary disease. Electronically Signed   By: Logan Bores M.D.   On: 06/07/2020 11:01    Procedures Procedures   Medications Ordered in ED Medications - No data to display  ED Course  I have reviewed the triage vital signs and the nursing notes.  Pertinent labs & imaging results that were available during my care of the patient were reviewed by me and considered in my medical decision making (see chart for details).    MDM Rules/Calculators/A&P                         Tamara Ball is a 40 y.o. female with pertinent past medical history of anemia, anxiety, palpitations, recent COVID-19 diagnosed December 25 that presents emergency department today for chest pain.  Patient appears well, normal vitals.  Differential to  include chest pain due to MSK, anxiety, anemia, side effects from COVID-19 infection 1 month ago.  Less likely to be ACS, however will obtain troponin at this time.  ECG interpreted by me demonstrated no signs of ischemia.  CXR interpreted by me demonstrated no signs of cardiopulmonary disease.  Labs demonstrated unremarkable CBC, BMP.  Troponin less than 2.  D-dimer negative.I do think that 1 troponin is sufficient, patient is having current chest pain for over 6 hours since this morning.  Patient does not want any medication for her chest pain, states that it is mild and tolerable. Ferritin pending, did discuss that this will be sent out patient will need to have this followed up with PCP.  Patient does have good follow-up with PCP and hematologist.  Upon reassessment patient appears well, normal vitals.  Patient reassured by findings.   Given the above findings, my suspicion is that patient symptoms could be from side effects from COVID-19 infection a month ago.  Contributing symptoms could also be from anxiety.  I will also refer to  cardiologist since patient is having intermittent palpitations.  No red flag symptoms.  Discussed this thoroughly with patient and strict return precautions given.  Doubt need for further emergent work up at this time. I explained the diagnosis and have given explicit precautions to return to the ER including for any other new or worsening symptoms. The patient understands and accepts the medical plan as it's been dictated and I have answered their questions. Discharge instructions concerning home care and prescriptions have been given. The patient is STABLE and is discharged to home in good condition.    Final Clinical Impression(s) / ED Diagnoses Final diagnoses:  Chest pain in adult  Palpitations    Rx / DC Orders ED Discharge Orders    None       Alfredia Client, PA-C 06/07/20 1113    Hayden Rasmussen, MD 06/07/20 Jeri Lager

## 2020-06-07 NOTE — Telephone Encounter (Signed)
Left a vm per inbasket message for pt to come in for a lab only appt this week     Tamara Ball

## 2020-06-07 NOTE — ED Triage Notes (Addendum)
Cp since Friday off and on left arm and leg pain had chest xray last wed and was clear  Feel sob at times wants her hgb and her ferritin levls checked

## 2020-06-07 NOTE — Discharge Instructions (Signed)
You are seen today for chest pain and palpitations.  Your work-up was reassuring.  As we discussed your ferritin levels will need to be checked and followed up with with your PCP.  You will not get a call about these, you can see these on MyChart.  If you have any new or worsening concerning symptoms please come back to the emergency department.  As we discussed the symptoms could be related to COVID-19, please with your PCP in the next couple of days.  I also did refer you to a cardiologist if you continue to have palpitations, you can schedule an appointment with them.   Get help right away if you: Have worsening chest pain. Feel short of breath. Have a very bad headache. Feel dizzy. Pass out (faint).

## 2020-06-08 ENCOUNTER — Ambulatory Visit (HOSPITAL_COMMUNITY)
Admission: RE | Admit: 2020-06-08 | Discharge: 2020-06-08 | Disposition: A | Discharge status: Home / Self Care | Payer: BC Managed Care – PPO | Source: Ambulatory Visit | Attending: Gastroenterology | Admitting: Gastroenterology

## 2020-06-08 ENCOUNTER — Encounter: Payer: Self-pay | Admitting: Family

## 2020-06-08 DIAGNOSIS — K769 Liver disease, unspecified: Secondary | ICD-10-CM | POA: Diagnosis not present

## 2020-06-08 MED ORDER — GADOBUTROL 1 MMOL/ML IV SOLN
8.0000 mL | Freq: Once | INTRAVENOUS | Status: AC | PRN
  Administered 2020-06-08: 8 mL via INTRAVENOUS

## 2020-06-17 ENCOUNTER — Encounter: Payer: Self-pay | Admitting: Family Medicine

## 2020-06-17 NOTE — Telephone Encounter (Signed)
Noted  

## 2020-06-17 NOTE — Progress Notes (Deleted)
Cardiology Office Note:    Date:  06/17/2020   ID:  Mayo Ao, DOB Sep 08, 1980, MRN [id number]  PCP:  Caren Macadam, MD  [facility] Cardiologist:  No primary care provider on file.  [facility] Electrophysiologist:  None   Referring MD: Caren Macadam, MD     History of Present Illness:    Tamara Ball is a 40 y.o. female with a hx of anxiety, anemia and recent COVID infection in 04/2020 who was referred by Dr. Ethlyn Gallery for further evaluation of chest pain and palpitations.  Patient seen in [facility] ED on 06/07/20 for episode of chest pain that had been ongoing for several days. Pain was in the left breast and did not radiate. Had some associated fatigue and SOB. In the ER, ECG with NSR, trop negative, d-dimer negative, CXR without acute pathology. She was then discharged home with plans to follow-up with her PCP and cardiology for further management.   Of note, saw Dr. Radford Pax in Aug 01, 2014 for palpitations where TTE and monitor were reassuringly normal.  Past Medical History:  Diagnosis Date  . Anemia   . Anxiety   . Chicken pox   . Chronic headaches   . Constipation   . Fibroids   . Iron deficiency anemia due to chronic blood loss    from heavy periods  . Menorrhagia   . Palpitations     No past surgical history on file.  Current Medications: No outpatient medications have been marked as taking for the 06/18/20 encounter (Appointment) with Freada Bergeron, MD.     Allergies:   Patient has no known allergies.   Social History   Socioeconomic History  . Marital status: Single    Spouse name: Not on file  . Number of children: Not on file  . Years of education: Not on file  . Highest education level: Not on file  Occupational History  . Occupation: Scientist, physiological  Tobacco Use  . Smoking status: Never Smoker  . Smokeless tobacco: Never Used  Vaping Use  . Vaping Use: Never used  Substance and Sexual Activity  . Alcohol use: Yes    Alcohol/week: 0.0  standard drinks    Comment: social  . Drug use: No  . Sexual activity: Not on file  Other Topics Concern  . Not on file  Social History Narrative   Work or School: higher education - Engineer, site Situation: lives with 42 yo      Spiritual Beliefs: Christian      Lifestyle: no regular exercise; diet is so so      Social Determinants of Radio broadcast assistant Strain: Not on file  Food Insecurity: Not on file  Transportation Needs: Not on file  Physical Activity: Not on file  Stress: Not on file  Social Connections: Not on file     Family History: The patient's ***family history includes Cancer in her mother; Diabetes in her maternal grandfather; Glaucoma in her maternal grandfather; Gout in her maternal grandfather; Healthy in her maternal grandmother; Hyperlipidemia in her maternal grandfather; Hypertension in her maternal grandfather, maternal uncle, and mother. There is no history of Colon polyps, Colon cancer, or Esophageal cancer.  ROS:   Please see the history of present illness.    *** All other systems reviewed and are negative.  EKGs/Labs/Other Studies Reviewed:    The following studies were reviewed today: TTE August 01, 2014: - Left ventricle: The cavity size was normal.  Systolic function was  normal. The estimated ejection fraction was in the range of 55%  to 60%. Wall motion was normal; there were no regional wall  motion abnormalities. Left ventricular diastolic function  parameters were normal.  - Aortic valve: Trileaflet; normal thickness leaflets. There was no  regurgitation.  - Aortic root: The aortic root was normal in size.  - Mitral valve: Structurally normal valve. There was no significant  regurgitation.  - Left atrium: The atrium was normal in size.  - Right ventricle: Systolic function was normal.  - Right atrium: The atrium was normal in size.  - Tricuspid valve: There was mild regurgitation.  - Pulmonic valve: There  was no regurgitation.  - Pulmonary arteries: Systolic pressure was within the normal  range.  - Inferior vena cava: The vessel was normal in size.  - Pericardium, extracardiac: There was no pericardial effusion.    EKG:  EKG is *** ordered today.  The ekg ordered today demonstrates ***  Recent Labs: 04/15/2020: ALT 11 06/07/2020: BUN 12; Creatinine, Ser 0.89; Hemoglobin 14.5; Platelets 227; Potassium 3.5; Sodium 139  Recent Lipid Panel    Component Value Date/Time   CHOL 109 06/17/2019 1101   TRIG 64 06/17/2019 1101   HDL 49 06/17/2019 1101   CHOLHDL 2 05/27/2018 0848   VLDL 10.4 05/27/2018 0848   LDLCALC 46 06/17/2019 1101     Risk Assessment/Calculations:   {Does this patient have ATRIAL FIBRILLATION?:[phone]}   Physical Exam:    VS:  LMP 06/08/2020 (Exact Date)     Wt Readings from Last 3 Encounters:  06/07/20 185 lb (83.9 kg)  06/02/20 184 lb 0.1 oz (83.5 kg)  05/26/20 188 lb (85.3 kg)     GEN: *** Well nourished, well developed in no acute distress HEENT: Normal NECK: No JVD; No carotid bruits LYMPHATICS: No lymphadenopathy CARDIAC: ***RRR, no murmurs, rubs, gallops RESPIRATORY:  Clear to auscultation without rales, wheezing or rhonchi  ABDOMEN: Soft, non-tender, non-distended MUSCULOSKELETAL:  No edema; No deformity  SKIN: Warm and dry NEUROLOGIC:  Alert and oriented x 3 PSYCHIATRIC:  Normal affect   ASSESSMENT:    No diagnosis found. PLAN:    In order of problems listed above:  #Chest Pain: Atypical and likely not cardiac in nature. Reassuring work-up in the ED.  #Palpitations: -Check 2 week zio monitor   {Are you ordering a CV Procedure (e.g. stress test, cath, DCCV, TEE, etc)?   Press F2        :[id number]}    Medication Adjustments/Labs and Tests Ordered: Current medicines are reviewed at length with the patient today.  Concerns regarding medicines are outlined above.  No orders of the defined types were placed in this encounter.  No  orders of the defined types were placed in this encounter.   There are no Patient Instructions on file for this visit.   Signed, Freada Bergeron, MD  06/17/2020 12:51 PM    [employer]

## 2020-06-18 ENCOUNTER — Ambulatory Visit: Payer: BC Managed Care – PPO | Admitting: Cardiology

## 2020-06-19 ENCOUNTER — Encounter (INDEPENDENT_AMBULATORY_CARE_PROVIDER_SITE_OTHER): Payer: Self-pay | Admitting: Physician Assistant

## 2020-06-20 ENCOUNTER — Other Ambulatory Visit: Payer: Self-pay

## 2020-06-20 ENCOUNTER — Emergency Department (HOSPITAL_BASED_OUTPATIENT_CLINIC_OR_DEPARTMENT_OTHER): Payer: BC Managed Care – PPO

## 2020-06-20 ENCOUNTER — Emergency Department (HOSPITAL_COMMUNITY): Payer: BC Managed Care – PPO

## 2020-06-20 ENCOUNTER — Emergency Department (HOSPITAL_BASED_OUTPATIENT_CLINIC_OR_DEPARTMENT_OTHER)
Admission: EM | Admit: 2020-06-20 | Discharge: 2020-06-20 | Disposition: A | Discharge status: Home / Self Care | Payer: BC Managed Care – PPO | Attending: Emergency Medicine | Admitting: Emergency Medicine

## 2020-06-20 ENCOUNTER — Encounter (HOSPITAL_BASED_OUTPATIENT_CLINIC_OR_DEPARTMENT_OTHER): Payer: Self-pay | Admitting: Emergency Medicine

## 2020-06-20 DIAGNOSIS — Z20822 Contact with and (suspected) exposure to covid-19: Secondary | ICD-10-CM | POA: Diagnosis not present

## 2020-06-20 DIAGNOSIS — R202 Paresthesia of skin: Secondary | ICD-10-CM | POA: Diagnosis not present

## 2020-06-20 DIAGNOSIS — R200 Anesthesia of skin: Secondary | ICD-10-CM | POA: Insufficient documentation

## 2020-06-20 DIAGNOSIS — R519 Headache, unspecified: Secondary | ICD-10-CM | POA: Diagnosis not present

## 2020-06-20 LAB — COMPREHENSIVE METABOLIC PANEL
ALT: 9 U/L (ref 0–44)
AST: 13 U/L — ABNORMAL LOW (ref 15–41)
Albumin: 3.9 g/dL (ref 3.5–5.0)
Alkaline Phosphatase: 49 U/L (ref 38–126)
Anion gap: 9 (ref 5–15)
BUN: 19 mg/dL (ref 6–20)
CO2: 23 mmol/L (ref 22–32)
Calcium: 8.9 mg/dL (ref 8.9–10.3)
Chloride: 105 mmol/L (ref 98–111)
Creatinine, Ser: 0.89 mg/dL (ref 0.44–1.00)
GFR, Estimated: >60 mL/min (ref >60)
Glucose, Bld: 110 mg/dL — ABNORMAL HIGH (ref 70–99)
Potassium: 3.8 mmol/L (ref 3.5–5.1)
Sodium: 137 mmol/L (ref 135–145)
Total Bilirubin: 0.3 mg/dL (ref 0.3–1.2)
Total Protein: 7.1 g/dL (ref 6.5–8.1)

## 2020-06-20 LAB — PREGNANCY, URINE: Preg Test, Ur: NEGATIVE

## 2020-06-20 LAB — URINALYSIS, ROUTINE W REFLEX MICROSCOPIC
Bilirubin Urine: NEGATIVE
Glucose, UA: NEGATIVE mg/dL
Ketones, ur: NEGATIVE mg/dL
Nitrite: NEGATIVE
Protein, ur: NEGATIVE mg/dL
Specific Gravity, Urine: 1.02 (ref 1.005–1.030)
pH: 5.5 (ref 5.0–8.0)

## 2020-06-20 LAB — DIFFERENTIAL
Abs Immature Granulocytes: 0.02 10*3/uL (ref 0.00–0.07)
Basophils Absolute: 0.1 10*3/uL (ref 0.0–0.1)
Basophils Relative: 1 %
Eosinophils Absolute: 0.6 10*3/uL — ABNORMAL HIGH (ref 0.0–0.5)
Eosinophils Relative: 6 %
Immature Granulocytes: 0 %
Lymphocytes Relative: 25 %
Lymphs Abs: 2.3 10*3/uL (ref 0.7–4.0)
Monocytes Absolute: 0.7 10*3/uL (ref 0.1–1.0)
Monocytes Relative: 7 %
Neutro Abs: 5.7 10*3/uL (ref 1.7–7.7)
Neutrophils Relative %: 61 %

## 2020-06-20 LAB — URINALYSIS, MICROSCOPIC (REFLEX)

## 2020-06-20 LAB — RAPID URINE DRUG SCREEN, HOSP PERFORMED
Amphetamines: NOT DETECTED
Barbiturates: NOT DETECTED
Benzodiazepines: NOT DETECTED
Cocaine: NOT DETECTED
Opiates: NOT DETECTED
Tetrahydrocannabinol: NOT DETECTED

## 2020-06-20 LAB — APTT: aPTT: 25 seconds (ref 24–36)

## 2020-06-20 LAB — CBC
HCT: 40.5 % (ref 36.0–46.0)
Hemoglobin: 13.4 g/dL (ref 12.0–15.0)
MCH: 30.5 pg (ref 26.0–34.0)
MCHC: 33.1 g/dL (ref 30.0–36.0)
MCV: 92 fL (ref 80.0–100.0)
Platelets: 202 10*3/uL (ref 150–400)
RBC: 4.4 MIL/uL (ref 3.87–5.11)
RDW: 13.3 % (ref 11.5–15.5)
WBC: 9.4 10*3/uL (ref 4.0–10.5)
nRBC: 0 % (ref 0.0–0.2)

## 2020-06-20 LAB — ETHANOL: Alcohol, Ethyl (B): <10 mg/dL (ref <10)

## 2020-06-20 LAB — CBG MONITORING, ED: Glucose-Capillary: 84 mg/dL (ref 70–99)

## 2020-06-20 LAB — SARS CORONAVIRUS 2 BY RT PCR (HOSPITAL ORDER, PERFORMED IN ~~LOC~~ HOSPITAL LAB): SARS Coronavirus 2: NEGATIVE

## 2020-06-20 LAB — PROTIME-INR
INR: 0.9 (ref 0.8–1.2)
Prothrombin Time: 12.1 seconds (ref 11.4–15.2)

## 2020-06-20 NOTE — ED Triage Notes (Signed)
Left arm, face numb upon waking this morning. Reports headache this morning. Reports going to bed at 0230. S/s discovered at 0335 approx. Lip "tightness" states she had similar symptoms last week with palpitations. Befast negative.

## 2020-06-20 NOTE — ED Notes (Signed)
Pt here from [facility] for MRI due to numbness & tinging in left arm and face that has since resolved. Pt A&Ox4. Complaint of a 2/10 headache.

## 2020-06-20 NOTE — Discharge Instructions (Addendum)
Please return the emergency department if your symptoms are worsening especially if you become weak on one side or the other, have difficulty walking or develop new symptoms.

## 2020-06-20 NOTE — ED Notes (Signed)
Tele neuro consult initiated with Tonette Bihari, RN.

## 2020-06-20 NOTE — Consult Note (Signed)
[facility] TeleNeurology Consult Services   Date of Service:   06/20/2020 04:08:17  Diagnosis:     .  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 Her symptoms primarily are suggestive of complicated migraine. Cannot rule out a stroke. I would suggest at least getting an MRI of the head and in the meantime keeping her on aspirin. If the MRI is negative, she can follow-up outpatient with neurology. All this was discussed with the patient and all her questions and concerns were addressed.  Metrics: Last Known Well: 06/20/2020 01:30:00 [facility] Notification Time: 06/20/2020 04:08:17 Arrival Time: 06/20/2020 03:44:00 Stamp Time: 06/20/2020 04:08:17 Initial Response Time: 06/20/2020 04:11:12 Symptoms: Left-sided numbness. NIHSS Start Assessment Time: 06/20/2020 04:14:18 Patient is not a candidate for Thrombolytic. Thrombolytic Medical Decision: 06/20/2020 04:19:33 Patient was not deemed candidate for Thrombolytic because of following reasons: Resolved symptoms (no residual disabling symptoms).  CT head showed no acute hemorrhage or acute core infarct.  ED Physician notified of diagnostic impression and management plan on 06/20/2020 04:34:16  Process delays noted by physician:  . Activation Delay:     Not Applicable  Advanced Imaging: Advanced Imaging Not Recommended because:  Clinical Presentation is not Suggestive of LVO and NIHSS is <6   Our recommendations are outlined below.  Recommendations:      .  Neuro Checks     .  DVT Prophylaxis     .  Head of Bed 30 Degrees     .  Euglycemia and Avoid Hyperthermia (PRN Acetaminophen)     .  Initiate Aspirin 81 MG Daily  Routine Consultation with [facility] for Follow up Care  Sign Out:     .  Discussed with Emergency Department Provider    ------------------------------------------------------------------------------  History of Present  Illness: Patient is a 40 year old Female.  Patient was brought by private transportation with symptoms of Left-sided numbness.  Extremely pleasant 40 year old female with past medical history of migraines, came to the hospital because of left face and arm numbness. She went to sleep around 130 and then she woke up, she felt numbness of the left side of the face and arm. He denies any issues with her speech or swallowing. Denies any focal body weakness. Denies gait or balance issues. She reports that she had an episode with chest pain and left-sided body numbness approximately a week ago. Patient denies any syncopal episodes. Denies any seizure-like activity.    Past Medical History:     . Covid-19     . There is NO history of Hypertension     . There is NO history of Diabetes Mellitus     . There is NO history of Hyperlipidemia     . There is NO history of Atrial Fibrillation     . There is NO history of Coronary Artery Disease     . There is NO history of Stroke     . Migraine  Social History: Smoking: No Alcohol Use: Yes Drug Use: No  Family History:HTN  Review of System:  14 Points Review of Systems was performed and was negative except mentioned in HPI.  Anticoagulant use:  No  Antiplatelet use: No  Allergies:  Reviewed     Examination: BP(128/93), Pulse(83), Blood Glucose(88) 1A: Level of Consciousness - Alert; keenly responsive + 0 1B: Ask Month and Age - Both Questions Right + 0 1C: Blink Eyes & Squeeze Hands - Performs Both Tasks + 0 2: Test Horizontal Extraocular  Movements - Normal + 0 3: Test Visual Fields - No Visual Loss + 0 4: Test Facial Palsy (Use Grimace if Obtunded) - Normal symmetry + 0 5A: Test Left Arm Motor Drift - No Drift for 10 Seconds + 0 5B: Test Right Arm Motor Drift - No Drift for 10 Seconds + 0 6A: Test Left Leg Motor Drift - No Drift for 5 Seconds + 0 6B: Test Right Leg Motor Drift - No Drift for 5 Seconds + 0 7: Test Limb Ataxia  (FNF/Heel-Shin) - No Ataxia + 0 8: Test Sensation - Normal; No sensory loss + 0 9: Test Language/Aphasia - Normal; No aphasia + 0 10: Test Dysarthria - Normal + 0 11: Test Extinction/Inattention - No abnormality + 0  NIHSS Score: 0  Pre-Morbid Modified Rankin Scale: 0 Points = No symptoms at all   Patient/Family was informed the Neurology Consult would occur via TeleHealth consult by way of interactive audio and video telecommunications and consented to receiving care in this manner.   Patient is being evaluated for possible acute neurologic impairment and high probability of imminent or life-threatening deterioration. I spent total of 30 minutes providing care to this patient, including time for face to face visit via telemedicine, review of medical records, imaging studies and discussion of findings with providers, the patient and/or family.   Dr Faustino Congress   [facility] [phone]  Case [id number]

## 2020-06-20 NOTE — ED Notes (Signed)
Patient verbalizes understanding of discharge instructions. Opportunity for questioning and answers were provided. Armband removed by staff, pt discharged from ED.  

## 2020-06-20 NOTE — ED Provider Notes (Signed)
Emergency Department Provider Note   I have reviewed the triage vital signs and the nursing notes.   HISTORY  Chief Complaint Numbness   HPI Tamara Ball is a 40 y.o. female with past medical history reviewed below presents to the emergency department after awaking this morning with numbness to the left face and left arm.  Patient tells me that the numbness sensation woke her from sleep at approximately 3:30 AM.  She went to bed somewhere in the 1:30 to 2 AM timeframe  without symptoms.  She states before going to bed she did develop a headache that was not typical of her migraines.  She did not experience any numbness or weakness.  She went to sleep without difficulty and the headache has since resolved.  She awoke with numbness to the left face and arm but no appreciable weakness.  Denies any vision change.  No obvious speech disturbance.  No symptoms in the lower extremities.  No prior history of stroke.   Past Medical History:  Diagnosis Date  . Anemia   . Anxiety   . Chicken pox   . Chronic headaches   . Constipation   . Fibroids   . Iron deficiency anemia due to chronic blood loss    from heavy periods  . Menorrhagia   . Palpitations     Patient Active Problem List   Diagnosis Date Noted  . Pneumonia due to COVID-19 virus 06/02/2020  . Cough 12/02/2019  . Chest congestion 12/02/2019  . Migraine 09/19/2019  . Class 1 obesity with serious comorbidity and body mass index (BMI) of 33.0 to 33.9 in adult 06/17/2018  . Menorrhagia   . Iron deficiency anemia due to chronic blood loss   . Allergic rhinitis 02/05/2014    History reviewed. No pertinent surgical history.  Allergies Patient has no known allergies.  Family History  Problem Relation Age of Onset  . Hypertension Mother   . Cancer Mother        skin cancer ?type but required radiation  . Healthy Maternal Grandmother   . Diabetes Maternal Grandfather   . Glaucoma Maternal Grandfather   . Hypertension  Maternal Grandfather   . Hyperlipidemia Maternal Grandfather   . Gout Maternal Grandfather   . Hypertension Maternal Uncle   . Colon polyps Neg Hx   . Colon cancer Neg Hx   . Esophageal cancer Neg Hx     Social History Social History   Tobacco Use  . Smoking status: Never Smoker  . Smokeless tobacco: Never Used  Vaping Use  . Vaping Use: Never used  Substance Use Topics  . Alcohol use: Yes    Alcohol/week: 0.0 standard drinks    Comment: social  . Drug use: No    Review of Systems  Constitutional: No fever/chills Eyes: No visual changes. ENT: No sore throat. Cardiovascular: Denies chest pain. Respiratory: Denies shortness of breath. Gastrointestinal: No abdominal pain.  No nausea, no vomiting.  No diarrhea.  No constipation. Genitourinary: Negative for dysuria. Musculoskeletal: Negative for back pain. Skin: Negative for rash. Neurological: Negative for focal weakness. Left face/arm numbness with HA.   10-point ROS otherwise negative.  ____________________________________________   PHYSICAL EXAM:  VITAL SIGNS: ED Triage Vitals  Enc Vitals Group     BP 06/20/20 0352 129/83     Pulse Rate 06/20/20 0352 89     Resp 06/20/20 0352 16     Temp 06/20/20 0352 98.3 F (36.8 C)     Temp src --  SpO2 06/20/20 0352 100 %     Weight 06/20/20 0354 196 lb (88.9 kg)   Constitutional: Alert and oriented. Well appearing and in no acute distress. Eyes: Conjunctivae are normal. PERRL. EOMI. Head: Atraumatic. Nose: No congestion/rhinnorhea. Mouth/Throat: Mucous membranes are moist.  Neck: No stridor.   Cardiovascular: Normal rate, regular rhythm. Good peripheral circulation. Grossly normal heart sounds.   Respiratory: Normal respiratory effort.  No retractions. Lungs CTAB. Gastrointestinal: Soft and nontender. No distention.  Musculoskeletal: No lower extremity tenderness nor edema. No gross deformities of extremities. Neurologic:  Normal speech and language.  No  cranial nerve deficits such as weakness or area of focal numbness.  Patient has very slight 4+/5 weakness in the LUE and LLE compared to the right. No sensory deficit. Some LLE drift on exam.  Skin:  Skin is warm, dry and intact. No rash noted.   ____________________________________________   LABS (all labs ordered are listed, but only abnormal results are displayed)  Labs Reviewed  DIFFERENTIAL - Abnormal; Notable for the following components:      Result Value   Eosinophils Absolute 0.6 (*)    All other components within normal limits  COMPREHENSIVE METABOLIC PANEL - Abnormal; Notable for the following components:   Glucose, Bld 110 (*)    AST 13 (*)    All other components within normal limits  SARS CORONAVIRUS 2 (HOSPITAL ORDER, [facility])  ETHANOL  PROTIME-INR  APTT  CBC  RAPID URINE DRUG SCREEN, HOSP PERFORMED  URINALYSIS, ROUTINE W REFLEX MICROSCOPIC  PREGNANCY, URINE  CBG MONITORING, ED   ____________________________________________  EKG  Rate: 99 PR: 139 QTc: 439  Sinus rhythm. Narrow QRS. Normal T waves. No ST elevation or depression.  ____________________________________________  RADIOLOGY  CT HEAD CODE STROKE WO CONTRAST  Result Date: 06/20/2020 CLINICAL DATA:  Code stroke. 40 year old female with left arm and facial numbness upon waking. Headache. EXAM: CT HEAD WITHOUT CONTRAST TECHNIQUE: Contiguous axial images were obtained from the base of the skull through the vertex without intravenous contrast. COMPARISON:  Radiographic sinus series 06/21/2015. 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 Other basilar cisterns appear normal. Normal pituitary size and configuration. No midline shift, ventriculomegaly, mass effect, evidence of mass lesion, intracranial hemorrhage or evidence of cortically based acute infarction. Gray-white matter differentiation is within normal  limits throughout the brain. No encephalomalacia identified. Vascular: No suspicious intracranial vascular hyperdensity. Skull: Negative. Sinuses/Orbits: Mild bilateral paranasal sinus mucosal thickening, most pronounced in the ethmoids. Tympanic cavities and mastoids appear clear. Other: Visualized orbits and scalp soft tissues are within normal limits. ASPECTS [facility] Stroke Program Early CT Score) Total score (0-10 with 10 being normal): 10 IMPRESSION: 1. No acute cortically based infarct or intracranial hemorrhage identified. ASPECTS 10. 2. 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 This might be a normal anatomic variant, but can also be associated with Chiari 1 malformation, and abnormal intracranial pressure both intracranial hypo-and hypertension (pseudotumor cerebri). 3. Mild paranasal sinus inflammation. Study discussed by telephone with Dr. Vonna Kotyk Jeret Goyer on 06/20/2020 at 04:28 . Electronically Signed   By: Genevie Ann M.D.   On: 06/20/2020 04:28    ____________________________________________   PROCEDURES  Procedure(s) performed:   Procedures  CRITICAL CARE Performed by: Margette Fast Total critical care time: 35 minutes Critical care time was exclusive of separately billable procedures and treating other patients. Critical care  was necessary to treat or prevent imminent or life-threatening deterioration. Critical care was time spent personally by me on the following activities: development of treatment plan with patient and/or surrogate as well as nursing, discussions with consultants, evaluation of patient's response to treatment, examination of patient, obtaining history from patient or surrogate, ordering and performing treatments and interventions, ordering and review of laboratory studies, ordering and review of radiographic studies, pulse oximetry and re-evaluation of patient's condition.  Nanda Quinton, MD Emergency Medicine  ____________________________________________   INITIAL IMPRESSION / ASSESSMENT AND PLAN / ED COURSE  Pertinent labs & imaging results that were available during my care of the patient were reviewed by me and considered in my medical decision making (see chart for details).   Patient presents to the emergency department with numbness to the left face and left arm.  Most of the symptoms have resolved but on my exam does describe some continued numbness to the left lower lip.  She does appear to be very slightly weak in the left upper and left lower extremities with some drift in the left lower extremity.  Differential includes acute stroke, complicated migraine. ICH/SAH much lower on differential.  Patient went to bed around 2 AM and awoke with symptoms at 3:30 AM. Will active a CODE STROKE and send for non-contrast CT head along with labs.   Spoke with radiology regarding the CT findings.  No acute hemorrhage or obvious findings to suspect stroke.  Favor normal variant on CT described above.  Patient's presentation is not consistent with pseudotumor.  No vision deficits. HA resolved.   Discussed the case with the teleneurologist on call.  See their associated note.  He advises MRI brain and if normal can follow with her neurologist for likely complicated migraine.   Discussed with Dr. Sedonia Small who accepts the patient to the [facility] ED for MRI. Patient updated and ok with going by EMS for MRI. Understands plan that if MRI is negative she will be discharged from there and will call family for a ride home.  ____________________________________________  FINAL CLINICAL IMPRESSION(S) / ED DIAGNOSES  Final diagnoses:  Numbness  Acute nonintractable headache, unspecified headache type    Note:  This document was prepared using Dragon voice recognition software and may include unintentional dictation errors.  Nanda Quinton, MD, [facility] Emergency Medicine    Berel Najjar, [facility],  MD 06/20/20 0500

## 2020-06-20 NOTE — ED Provider Notes (Signed)
  Provider Note MRN:  [id number]  Arrival date & time: 06/20/20    ED Course and Medical Decision Making  Assumed care from Dr. Laverta Baltimore upon patient transfer.  Initially a code stroke initiation at [facility], headache with subtle left-sided deficits.  Telemetry neurologist favoring complex migraine, transferred here for MRI.  Patient feels completely back to normal.  Plan is to obtain MRI and discharge if reassuring.  Signed out to oncoming provider at shift change.  Procedures  Final Clinical Impressions(s) / ED Diagnoses     ICD-10-CM   1. Numbness  R20.0   2. Acute nonintractable headache, unspecified headache type  R51.9     ED Discharge Orders    None      Discharge Instructions   None     Barth Kirks. Sedonia Small, [place] [email or web address]    Maudie Flakes, MD 06/20/20 [phone]

## 2020-06-20 NOTE — ED Notes (Signed)
Tele Neuro in process with Mike Craze, MD.

## 2020-06-20 NOTE — ED Provider Notes (Signed)
Patient with numbness and headache earlier and here awaiting mri Physical Exam  BP 130/86   Pulse 83   Temp 99 F (37.2 C) (Oral)   Resp 14   Wt 88.9 kg   LMP 06/08/2020 (Exact Date)   SpO2 100%   BMI 31.64 kg/m   Physical Exam Well-developed well-nourished female sitting in the bed moving equally in no acute distress ED Course/Procedures     Procedures MRI results reviewed some cerebellar tonsillar ectopia noted with differential diagnosis of Chiari I increased pressure, or normal variant Labs and urine reviewed MDM  Patient is asymptomatic and wishes to be discharged. Reviewed MRI findings and Discussed all the above including bacteria in urine which appears to be a contaminated specimen.  Also she does have some ongoing sinus disease consistent with the CT and MRI.  Advised regarding need for follow-up with her primary care doctor regarding the MRI.  Have a low index of suspicion that any of the symptoms today were due to this.  We have discussed return precautions and need for follow-up and she voices understanding.       Pattricia Boss, MD 06/20/20 [phone]

## 2020-06-20 NOTE — ED Notes (Signed)
Patient transported to MRI 

## 2020-06-20 NOTE — ED Notes (Addendum)
EDP at bedside. Pt reports "a severe pain in head" prior to going to sleep ~0230 that did not feel like her typical migraines. Pain lasted for "minutes" . Pt then medicated for HA (benadryl & "tension HA med" ) she states she woke up at 0330 with numbness and tight feeling to L arm and face. States she "walked around a little bit" and it resolved except for her lower lip. Pt ambulatory on arrival, and moving all extremities freely. No slurred speech, confusion or expressive aphasia noted. Pt reports resolution of all sx except lower lip "tightness".

## 2020-06-21 ENCOUNTER — Other Ambulatory Visit: Payer: Self-pay | Admitting: Family

## 2020-06-22 ENCOUNTER — Inpatient Hospital Stay: Payer: BC Managed Care – PPO | Admitting: Family

## 2020-06-22 ENCOUNTER — Inpatient Hospital Stay: Payer: BC Managed Care – PPO

## 2020-06-30 ENCOUNTER — Other Ambulatory Visit: Payer: Self-pay | Admitting: Family

## 2020-06-30 DIAGNOSIS — N920 Excessive and frequent menstruation with regular cycle: Secondary | ICD-10-CM

## 2020-06-30 DIAGNOSIS — D500 Iron deficiency anemia secondary to blood loss (chronic): Secondary | ICD-10-CM

## 2020-07-01 ENCOUNTER — Other Ambulatory Visit: Payer: Self-pay

## 2020-07-01 ENCOUNTER — Ambulatory Visit: Payer: BC Managed Care – PPO | Admitting: Cardiovascular Disease

## 2020-07-01 ENCOUNTER — Inpatient Hospital Stay: Payer: BC Managed Care – PPO | Attending: Family

## 2020-07-01 ENCOUNTER — Encounter: Payer: Self-pay | Admitting: Family

## 2020-07-01 ENCOUNTER — Encounter: Payer: Self-pay | Admitting: Cardiovascular Disease

## 2020-07-01 ENCOUNTER — Inpatient Hospital Stay: Payer: BC Managed Care – PPO | Admitting: Family

## 2020-07-01 ENCOUNTER — Telehealth: Payer: Self-pay

## 2020-07-01 VITALS — BP 125/69 | HR 70 | Temp 97.8°F | Resp 17 | Ht 66.0 in | Wt 191.4 lb

## 2020-07-01 VITALS — BP 116/80 | HR 69 | Ht 66.0 in | Wt 191.0 lb

## 2020-07-01 DIAGNOSIS — R002 Palpitations: Secondary | ICD-10-CM | POA: Diagnosis not present

## 2020-07-01 DIAGNOSIS — N920 Excessive and frequent menstruation with regular cycle: Secondary | ICD-10-CM | POA: Insufficient documentation

## 2020-07-01 DIAGNOSIS — D500 Iron deficiency anemia secondary to blood loss (chronic): Secondary | ICD-10-CM | POA: Diagnosis not present

## 2020-07-01 DIAGNOSIS — R079 Chest pain, unspecified: Secondary | ICD-10-CM | POA: Diagnosis not present

## 2020-07-01 DIAGNOSIS — Z8616 Personal history of COVID-19: Secondary | ICD-10-CM | POA: Diagnosis not present

## 2020-07-01 LAB — CBC WITH DIFFERENTIAL (CANCER CENTER ONLY)
Abs Immature Granulocytes: 0.09 10*3/uL — ABNORMAL HIGH (ref 0.00–0.07)
Basophils Absolute: 0.1 10*3/uL (ref 0.0–0.1)
Basophils Relative: 1 %
Eosinophils Absolute: 0.5 10*3/uL (ref 0.0–0.5)
Eosinophils Relative: 6 %
HCT: 42.7 % (ref 36.0–46.0)
Hemoglobin: 13.6 g/dL (ref 12.0–15.0)
Immature Granulocytes: 1 %
Lymphocytes Relative: 23 %
Lymphs Abs: 1.9 10*3/uL (ref 0.7–4.0)
MCH: 28.8 pg (ref 26.0–34.0)
MCHC: 31.9 g/dL (ref 30.0–36.0)
MCV: 90.5 fL (ref 80.0–100.0)
Monocytes Absolute: 0.5 10*3/uL (ref 0.1–1.0)
Monocytes Relative: 6 %
Neutro Abs: 5.2 10*3/uL (ref 1.7–7.7)
Neutrophils Relative %: 63 %
Platelet Count: 184 10*3/uL (ref 150–400)
RBC: 4.72 MIL/uL (ref 3.87–5.11)
RDW: 13.3 % (ref 11.5–15.5)
WBC Count: 8.1 10*3/uL (ref 4.0–10.5)
nRBC: 0 % (ref 0.0–0.2)

## 2020-07-01 LAB — RETICULOCYTES
Immature Retic Fract: 10.8 % (ref 2.3–15.9)
RBC.: 4.57 MIL/uL (ref 3.87–5.11)
Retic Count, Absolute: 105.6 10*3/uL (ref 19.0–186.0)
Retic Ct Pct: 2.3 % (ref 0.4–3.1)

## 2020-07-01 NOTE — Progress Notes (Signed)
Hematology and Oncology Follow Up Visit  Tamara Ball [id number] 1980/11/08 40 y.o. 07/01/2020   Principle Diagnosis:  Iron deficiency anemia secondary to menorrhagia  Current Therapy: IV iron as indicated   Interim History:  Tamara Ball is here today for follow-up. She is doing fairly well but has noted increased fatigue, dizziness and tingling in her arms and legs similar to how she has felt in the past when her iron was low.  Ferritin last month was 46.  She did go to the ED for numbness in the left side of her face. Thankfully her MRI was negative. She states that this reolved without intervention.  She did have Covid pneumonia over Christmas and states that she has been to the ED with chest pain and palpitations. She is following up with cardiology later this afternoon.  No fever, n/v, cough, rash, dizziness, SOB, abdominal pain or changes in bowel or bladder habits.  She has constipation and takes Mirilax when needed.  Her cycles is regular and still heavy lasting a whole week. No other blood loss noted. No bruising or petechiae.  She has maintained a good appetite and is staying well hydrated. Her weight is stable.   ECOG Performance Status: 1 - Symptomatic but completely ambulatory  Medications:  Allergies as of 07/01/2020   No Known Allergies     Medication List       Accurate as of July 01, 2020  2:39 PM. If you have any questions, ask your nurse or doctor.        STOP taking these medications   baclofen 10 MG tablet Commonly known as: LIORESAL Stopped by: [name], NP   buPROPion 200 MG 12 hr tablet Commonly known as: WELLBUTRIN SR Stopped by: [name], NP   dicyclomine 20 MG tablet Commonly known as: BENTYL Stopped by: [name], NP   ondansetron 4 MG tablet Commonly known as: Zofran Stopped by: [name], NP   promethazine 25 MG tablet Commonly known as: PHENERGAN Stopped by: [name], NP    zonisamide 50 MG capsule Commonly known as: ZONEGRAN Stopped by: [name], NP     TAKE these medications   albuterol 108 (90 Base) MCG/ACT inhaler Commonly known as: VENTOLIN HFA Inhale 2 puffs into the lungs every 6 (six) hours as needed for wheezing or shortness of breath.   Aviane 0.1-20 MG-MCG tablet Generic drug: levonorgestrel-ethinyl estradiol   b complex vitamins tablet Take 1 tablet by mouth daily.   BD Pen Needle Nano 2nd Gen 32G X 4 MM Misc Generic drug: Insulin Pen Needle 1 Package by Does not apply route daily.   BENADRYL PO Take by mouth as needed.   Biotin 10000 MCG Tabs Take 1 tablet by mouth daily.   Dymista 137-50 MCG/ACT Susp Generic drug: Azelastine-Fluticasone 1 actuation each nostril twice daily   eszopiclone 1 MG Tabs tablet Commonly known as: LUNESTA Take 1 tablet (1 mg total) by mouth at bedtime as needed for sleep. Take immediately before bedtime   Fish Oil 1000 MG Caps Take 1 capsule by mouth daily.   fluconazole 150 MG tablet Commonly known as: DIFLUCAN Take 1 tablet PO x1; repeat in 72 hours if symptoms are still present.   levocetirizine 5 MG tablet Commonly known as: XYZAL Take 1 tablet (5 mg total) by mouth every evening.   montelukast 10 MG tablet Commonly known as: SINGULAIR Take 1 tablet (10 mg total) by mouth at bedtime.   multivitamin with minerals Tabs tablet Take  1 tablet by mouth daily.   polyethylene glycol 17 g packet Commonly known as: MIRALAX / GLYCOLAX Take 17 g by mouth daily.   Saxenda 18 MG/3ML Sopn Generic drug: Liraglutide -Weight Management Inject 3 mg into the skin daily.   VITAMIN D PO Take 10,000 Units by mouth once a week.       Allergies: No Known Allergies  Past Medical History, Surgical history, Social history, and Family History were reviewed and updated.  Review of Systems: All other 10 point review of systems is negative.   Physical Exam:  height is 5\' 6"  (1.676 m) and  weight is 191 lb 6.4 oz (86.8 kg). Her oral temperature is 97.8 F (36.6 C). Her blood pressure is 125/69 and her pulse is 70. Her respiration is 17 and oxygen saturation is 100%.   Wt Readings from Last 3 Encounters:  07/01/20 191 lb 6.4 oz (86.8 kg)  06/20/20 196 lb (88.9 kg)  06/07/20 185 lb (83.9 kg)    Ocular: Sclerae unicteric, pupils equal, round and reactive to light Ear-nose-throat: Oropharynx clear, dentition fair Lymphatic: No cervical or supraclavicular adenopathy Lungs no rales or rhonchi, good excursion bilaterally Heart regular rate and rhythm, no murmur appreciated Abd soft, nontender, positive bowel sounds MSK no focal spinal tenderness, no joint edema Neuro: non-focal, well-oriented, appropriate affect Breasts: Deferred   Lab Results  Component Value Date   WBC 8.1 07/01/2020   HGB 13.6 07/01/2020   HCT 42.7 07/01/2020   MCV 90.5 07/01/2020   PLT 184 07/01/2020   Lab Results  Component Value Date   FERRITIN 46 06/07/2020   IRON 105 12/09/2019   TIBC 394 12/09/2019   UIBC 289 12/09/2019   IRONPCTSAT 27 12/09/2019   Lab Results  Component Value Date   RETICCTPCT 2.3 07/01/2020   RBC 4.72 07/01/2020   RBC 4.57 07/01/2020   RETICCTABS 64,400 05/16/2016   No results found for: KPAFRELGTCHN, LAMBDASER, KAPLAMBRATIO No results found for: IGGSERUM, IGA, IGMSERUM No results found for: Odetta Pink, SPEI   Chemistry      Component Value Date/Time   NA 137 06/20/2020 0405   NA 139 01/27/2020 0844   K 3.8 06/20/2020 0405   K 4.2 08/11/2016 1404   CL 105 06/20/2020 0405   CL 103 08/11/2016 1404   CO2 23 06/20/2020 0405   CO2 26 08/11/2016 1404   BUN 19 06/20/2020 0405   BUN 11 01/27/2020 0844   CREATININE 0.89 06/20/2020 0405   CREATININE 0.87 08/11/2016 1404      Component Value Date/Time   CALCIUM 8.9 06/20/2020 0405   CALCIUM 9.5 08/11/2016 1404   ALKPHOS 49 06/20/2020 0405   ALKPHOS 58  08/11/2016 1404   AST 13 (L) 06/20/2020 0405   AST 9 08/11/2016 1404   ALT 9 06/20/2020 0405   ALT 8 08/11/2016 1404   BILITOT 0.3 06/20/2020 0405   BILITOT 0.5 01/27/2020 0844       Impression and Plan: Tamara Ball is a very pleasant 40 yo African American female with iron deficiency secondary to heavy cycles. Iron studies are pending. We will replace if needed.  Follow-up in 6 months.  She can contact our office with any questions or concerns.    [name], NP 2/24/20222:39 PM

## 2020-07-01 NOTE — Telephone Encounter (Signed)
appts made per verbal los, pt aware    anne

## 2020-07-01 NOTE — Patient Instructions (Signed)
Medication Instructions:  The current medical regimen is effective;  continue present plan and medications.  *If you need a refill on your cardiac medications before your next appointment, please call your pharmacy*    Follow-Up: At [facility], you and your health needs are our priority.  As part of our continuing mission to provide you with exceptional heart care, we have created designated Provider Care Teams.  These Care Teams include your primary Cardiologist (physician) and Advanced Practice Providers (APPs -  Physician Assistants and Nurse Practitioners) who all work together to provide you with the care you need, when you need it.  We recommend signing up for the patient portal called "MyChart".  Sign up information is provided on this After Visit Summary.  MyChart is used to connect with patients for Virtual Visits (Telemedicine).  Patients are able to view lab/test results, encounter notes, upcoming appointments, etc.  Non-urgent messages can be sent to your provider as well.   To learn more about what you can do with MyChart, go to [email or web address].    Your next appointment:   As needed  The format for your next appointment:   In Person  Provider:   [place] O'Neal, MD      

## 2020-07-01 NOTE — Progress Notes (Signed)
Cardiology Office Note:   Date:  07/01/2020  NAME:  Tamara Ball    MRN: [id number] DOB:  [birth year]   PCP:  Caren Macadam, MD  Cardiologist:  No primary care provider on file.  Electrophysiologist:  None   Referring MD: Caren Macadam, MD   Chief Complaint  Patient presents with  . Chest Pain    History of Present Illness:   Tamara Ball is a 40 y.o. female with a hx of chronic migraines, iron deficiency anemia who is being seen today for the evaluation of chest pain/palpitations at the request of Caren Macadam, MD.  She was seen in the emergency room on 06/07/2020.  This was due to chest pain and palpitations.  Apparently prior to that visit she was having what she describes as sharp left-sided chest pain.  Symptoms were occur randomly and last for minutes.  They were not exertional and not alleviated by rest.  She reports no association with food.  She also had intermittent episodes of her heart racing.  She felt like her heart was beating out of her chest.  She apparently was diagnosed with COVID-19 pneumonia on 05/01/2020.  Symptoms included cough congestion and chest pain.  She was seen in the emergency room with negative cardiac enzymes.  Her EKG was unremarkable.  She was instructed to follow-up here.  She was then seen in the emergency room on 06/20/2020 with what she describes as numbness in her left arm and her face falling asleep.  She was evaluated for possible stroke.  Brain MRI was negative.  Apparently since that time she is had no further episodes of chest pain or palpitations.  Symptoms have resolved.  Recent chest x-ray shows her COVID-19 pneumonia has resolved.  She reports no exertional chest pain or shortness of breath.  No further palpitations.  Her EKG in office demonstrates sinus rhythm with no acute ischemic change or evidence of prior infarction.  Again, she had a negative troponin value which is inconsistent with myocarditis.  Blood pressure 116/80.   Labs her primary care physician demonstrated total cholesterol 109, HDL 49, LDL 46, triglycerides 64.  Most recent A1c 5.1.  TSH 2.2.  She reports she does not smoke, drink alcohol or use drugs.  She is a Visual merchandiser.  She does not exercise routinely but has no limitations.  No strong family history of heart disease.  Past Medical History: Past Medical History:  Diagnosis Date  . Anemia   . Anxiety   . Chicken pox   . Chronic headaches   . Constipation   . Fibroids   . Iron deficiency anemia due to chronic blood loss    from heavy periods  . Menorrhagia   . Palpitations     Past Surgical History: History reviewed. No pertinent surgical history.  Current Medications: Current Meds  Medication Sig  . albuterol (VENTOLIN HFA) 108 (90 Base) MCG/ACT inhaler Inhale 2 puffs into the lungs every 6 (six) hours as needed for wheezing or shortness of breath.  Vassie Moselle 0.1-20 MG-MCG tablet   . b complex vitamins tablet Take 1 tablet by mouth daily.  . Biotin 10000 MCG TABS Take 1 tablet by mouth daily.  . diphenhydrAMINE HCl (BENADRYL PO) Take by mouth as needed.  Marland Kitchen DYMISTA 137-50 MCG/ACT SUSP 1 actuation each nostril twice daily  . Insulin Pen Needle (BD PEN NEEDLE NANO 2ND GEN) 32G X 4 MM MISC 1 Package by Does not apply route  daily.  . levocetirizine (XYZAL) 5 MG tablet Take 1 tablet (5 mg total) by mouth every evening.  . montelukast (SINGULAIR) 10 MG tablet Take 1 tablet (10 mg total) by mouth at bedtime.  . Multiple Vitamin (MULTIVITAMIN WITH MINERALS) TABS tablet Take 1 tablet by mouth daily.  . Omega-3 Fatty Acids (FISH OIL) 1000 MG CAPS Take 1 capsule by mouth daily.  . polyethylene glycol (MIRALAX / GLYCOLAX) 17 g packet Take 17 g by mouth daily.  Marland Kitchen VITAMIN D PO Take 10,000 Units by mouth once a week.     Allergies:    Patient has no known allergies.   Social History: Social History   Socioeconomic History  . Marital status: Single    Spouse name: Not on file   . Number of children: 1  . Years of education: Not on file  . Highest education level: Not on file  Occupational History  . Occupation: Scientist, physiological  Tobacco Use  . Smoking status: Never Smoker  . Smokeless tobacco: Never Used  Vaping Use  . Vaping Use: Never used  Substance and Sexual Activity  . Alcohol use: Yes    Alcohol/week: 0.0 standard drinks    Comment: social  . Drug use: No  . Sexual activity: Not on file  Other Topics Concern  . Not on file  Social History Narrative   Work or School: higher education - Engineer, site Situation: lives with 42 yo      Spiritual Beliefs: Christian      Lifestyle: no regular exercise; diet is so so      Social Determinants of Radio broadcast assistant Strain: Not on file  Food Insecurity: Not on file  Transportation Needs: Not on file  Physical Activity: Not on file  Stress: Not on file  Social Connections: Not on file     Family History: The patient's family history includes Cancer in her mother; Diabetes in her maternal grandfather; Glaucoma in her maternal grandfather; Gout in her maternal grandfather; Healthy in her maternal grandmother; Hyperlipidemia in her maternal grandfather; Hypertension in her maternal grandfather, maternal uncle, and mother. There is no history of Colon polyps, Colon cancer, or Esophageal cancer.  ROS:   All other ROS reviewed and negative. Pertinent positives noted in the HPI.     EKGs/Labs/Other Studies Reviewed:   The following studies were personally reviewed by me today:  EKG:  EKG is ordered today.  The ekg ordered today demonstrates normal sinus rhythm heart rate 69, no acute ischemic changes, no evidence of prior infarction, and was personally reviewed by me.   Recent Labs: 06/20/2020: ALT 9; BUN 19; Creatinine, Ser 0.89; Potassium 3.8; Sodium 137 07/01/2020: Hemoglobin 13.6; Platelet Count 184   Recent Lipid Panel    Component Value Date/Time   CHOL 109 06/17/2019 1101    TRIG 64 06/17/2019 1101   HDL 49 06/17/2019 1101   CHOLHDL 2 05/27/2018 0848   VLDL 10.4 05/27/2018 0848   LDLCALC 46 06/17/2019 1101    Physical Exam:   VS:  BP 116/80 (BP Location: Left Arm, Patient Position: Sitting, Cuff Size: Normal)   Pulse 69   Ht 5\' 6"  (1.676 m)   Wt 191 lb (86.6 kg)   LMP 06/08/2020 (Exact Date)   BMI 30.83 kg/m    Wt Readings from Last 3 Encounters:  07/01/20 191 lb (86.6 kg)  07/01/20 191 lb 6.4 oz (86.8 kg)  06/20/20 196 lb (88.9 kg)  General: Well nourished, well developed, in no acute distress Head: Atraumatic, normal size  Eyes: PEERLA, EOMI  Neck: Supple, no JVD Endocrine: No thryomegaly Cardiac: Normal S1, S2; RRR; no murmurs, rubs, or gallops Lungs: Clear to auscultation bilaterally, no wheezing, rhonchi or rales  Abd: Soft, nontender, no hepatomegaly  Ext: No edema, pulses 2+ Musculoskeletal: No deformities, BUE and BLE strength normal and equal Skin: Warm and dry, no rashes   Neuro: Alert and oriented to person, place, time, and situation, CNII-XII grossly intact, no focal deficits  Psych: Normal mood and affect   ASSESSMENT:   Tamara Ball is a 40 y.o. female who presents for the following: 1. Chest pain of uncertain etiology   2. Palpitations     PLAN:   1. Chest pain of uncertain etiology 2. Palpitations -Atypical chest pain and palpitations after COVID-19 pneumonia.  EKG in office demonstrates normal sinus rhythm with no acute ischemic changes or evidence of infarction.  She was seen in the emergency room and had negative cardiac enzymes.  This effectively rules out myocarditis.  Her cardiovascular examination is normal.  Her symptoms have resolved for roughly 2 weeks.  Given that she is having no further symptoms and her EKG and examination are normal I have recommended no further testing.  She is relatively healthy and I have a low suspicion for underlying cardiac disease.  She will let us know if symptoms return and we will  work this up.  For now, given her lack of symptoms I see no need for further evaluation.  Disposition: Return if symptoms worsen or fail to improve.  Medication Adjustments/Labs and Tests Ordered: Current medicines are reviewed at length with the patient today.  Concerns regarding medicines are outlined above.  Orders Placed This Encounter  Procedures  . EKG 12-Lead   No orders of the defined types were placed in this encounter.   Patient Instructions  Medication Instructions:  The current medical regimen is effective;  continue present plan and medications.  *If you need a refill on your cardiac medications before your next appointment, please call your pharmacy*  Follow-Up: At [facility], you and your health needs are our priority.  As part of our continuing mission to provide you with exceptional heart care, we have created designated Provider Care Teams.  These Care Teams include your primary Cardiologist (physician) and Advanced Practice Providers (APPs -  Physician Assistants and Nurse Practitioners) who all work together to provide you with the care you need, when you need it.  We recommend signing up for the patient portal called "MyChart".  Sign up information is provided on this After Visit Summary.  MyChart is used to connect with patients for Virtual Visits (Telemedicine).  Patients are able to view lab/test results, encounter notes, upcoming appointments, etc.  Non-urgent messages can be sent to your provider as well.   To learn more about what you can do with MyChart, go to [email or web address].    Your next appointment:   As needed  The format for your next appointment:   In Person  Provider:   Eleonore Chiquito, MD       Signed, Addison Naegeli. Audie Box, [place]  [address] [place], [place] [address] [phone]  07/01/2020 5:07 PM

## 2020-07-02 LAB — IRON AND TIBC
Iron: 63 ug/dL (ref 41–142)
Saturation Ratios: 13 % — ABNORMAL LOW (ref 21–57)
TIBC: 470 ug/dL — ABNORMAL HIGH (ref 236–444)
UIBC: 407 ug/dL — ABNORMAL HIGH (ref 120–384)

## 2020-07-02 LAB — FERRITIN: Ferritin: 24 ng/mL (ref 11–307)

## 2020-07-05 ENCOUNTER — Other Ambulatory Visit: Payer: Self-pay | Admitting: Family

## 2020-07-05 ENCOUNTER — Telehealth: Payer: Self-pay

## 2020-07-05 NOTE — Telephone Encounter (Signed)
Called and left a vm with iron tx per 2/28 22 sch message   Tamara Ball

## 2020-07-05 NOTE — Telephone Encounter (Signed)
Pt called in to r/s her 07/13/20 appt to earlier on a other day, done    Rogelio Waynick

## 2020-07-06 ENCOUNTER — Other Ambulatory Visit: Payer: Self-pay

## 2020-07-06 ENCOUNTER — Inpatient Hospital Stay: Payer: BC Managed Care – PPO | Attending: Family

## 2020-07-06 VITALS — BP 117/61 | HR 75 | Temp 98.6°F | Resp 17

## 2020-07-06 DIAGNOSIS — D500 Iron deficiency anemia secondary to blood loss (chronic): Secondary | ICD-10-CM | POA: Diagnosis present

## 2020-07-06 DIAGNOSIS — N920 Excessive and frequent menstruation with regular cycle: Secondary | ICD-10-CM | POA: Diagnosis present

## 2020-07-06 MED ORDER — SODIUM CHLORIDE 0.9 % IV SOLN
125.0000 mg | Freq: Once | INTRAVENOUS | Status: AC
  Administered 2020-07-06: 125 mg via INTRAVENOUS
  Filled 2020-07-06: qty 10

## 2020-07-06 MED ORDER — SODIUM CHLORIDE 0.9 % IV SOLN
Freq: Once | INTRAVENOUS | Status: AC
  Filled 2020-07-06: qty 250

## 2020-07-06 NOTE — Patient Instructions (Signed)
Sodium Ferric Gluconate Complex injection What is this medicine? SODIUM FERRIC GLUCONATE COMPLEX (SOE dee um FER ik GLOO koe nate KOM pleks) is an iron replacement. It is used with epoetin therapy to treat low iron levels in patients who are receiving hemodialysis. This medicine may be used for other purposes; ask your health care provider or pharmacist if you have questions. COMMON BRAND NAME(S): Ferrlecit, Nulecit What should I tell my health care provider before I take this medicine? They need to know if you have any of the following conditions:  anemia that is not from iron deficiency  high levels of iron in the body  an unusual or allergic reaction to iron, benzyl alcohol, other medicines, foods, dyes, or preservatives  pregnant or are trying to become pregnant  breast-feeding How should I use this medicine? This medicine is for infusion into a vein. It is given by a health care professional in a hospital or clinic setting. Talk to your pediatrician regarding the use of this medicine in children. While this drug may be prescribed for children as young as 6 years old for selected conditions, precautions do apply. Overdosage: If you think you have taken too much of this medicine contact a poison control center or emergency room at once. NOTE: This medicine is only for you. Do not share this medicine with others. What if I miss a dose? It is important not to miss your dose. Call your doctor or health care professional if you are unable to keep an appointment. What may interact with this medicine? Do not take this medicine with any of the following medications:  deferoxamine  dimercaprol  other iron products This medicine may also interact with the following medications:  chloramphenicol  deferasirox  medicine for blood pressure like enalapril This list may not describe all possible interactions. Give your health care provider a list of all the medicines, herbs,  non-prescription drugs, or dietary supplements you use. Also tell them if you smoke, drink alcohol, or use illegal drugs. Some items may interact with your medicine. What should I watch for while using this medicine? Your condition will be monitored carefully while you are receiving this medicine. Visit your doctor for check-ups as directed. What side effects may I notice from receiving this medicine? Side effects that you should report to your doctor or health care professional as soon as possible:  allergic reactions like skin rash, itching or hives, swelling of the face, lips, or tongue  breathing problems  changes in hearing  changes in vision  chills, flushing, or sweating  fast, irregular heartbeat  feeling faint or lightheaded, falls  fever, flu-like symptoms  high or low blood pressure  pain, tingling, numbness in the hands or feet  severe pain in the chest, back, flanks, or groin  swelling of the ankles, feet, hands  trouble passing urine or change in the amount of urine  unusually weak or tired Side effects that usually do not require medical attention (report to your doctor or health care professional if they continue or are bothersome):  cramps  dark colored stools  diarrhea  headache  nausea, vomiting  stomach upset This list may not describe all possible side effects. Call your doctor for medical advice about side effects. You may report side effects to FDA at 1-800-FDA-1088. Where should I keep my medicine? This drug is given in a hospital or clinic and will not be stored at home. NOTE: This sheet is a summary. It may not cover all   possible information. If you have questions about this medicine, talk to your doctor, pharmacist, or health care provider.  2021 Elsevier/Gold Standard (2007-12-25 15:58:57)   

## 2020-07-13 ENCOUNTER — Ambulatory Visit (INDEPENDENT_AMBULATORY_CARE_PROVIDER_SITE_OTHER): Payer: BC Managed Care – PPO | Admitting: Physician Assistant

## 2020-07-13 ENCOUNTER — Encounter (INDEPENDENT_AMBULATORY_CARE_PROVIDER_SITE_OTHER): Payer: Self-pay | Admitting: Physician Assistant

## 2020-07-13 ENCOUNTER — Other Ambulatory Visit: Payer: Self-pay

## 2020-07-13 ENCOUNTER — Ambulatory Visit: Payer: BC Managed Care – PPO

## 2020-07-13 VITALS — BP 116/69 | HR 72 | Temp 98.6°F | Ht 66.0 in | Wt 189.0 lb

## 2020-07-13 DIAGNOSIS — R739 Hyperglycemia, unspecified: Secondary | ICD-10-CM

## 2020-07-13 DIAGNOSIS — F3289 Other specified depressive episodes: Secondary | ICD-10-CM | POA: Diagnosis not present

## 2020-07-13 DIAGNOSIS — E559 Vitamin D deficiency, unspecified: Secondary | ICD-10-CM | POA: Diagnosis not present

## 2020-07-13 DIAGNOSIS — Z6830 Body mass index (BMI) 30.0-30.9, adult: Secondary | ICD-10-CM

## 2020-07-13 DIAGNOSIS — E669 Obesity, unspecified: Secondary | ICD-10-CM | POA: Diagnosis not present

## 2020-07-13 DIAGNOSIS — Z9189 Other specified personal risk factors, not elsewhere classified: Secondary | ICD-10-CM

## 2020-07-13 MED ORDER — SAXENDA 18 MG/3ML ~~LOC~~ SOPN: 3.0000 mg | PEN_INJECTOR | Freq: Every day | SUBCUTANEOUS | 0 refills | Status: DC

## 2020-07-13 MED ORDER — BD PEN NEEDLE NANO 2ND GEN 32G X 4 MM MISC: 1.0000 | Freq: Every day | 0 refills | Status: DC

## 2020-07-13 MED ORDER — BUPROPION HCL ER (SR) 150 MG PO TB12
150.0000 mg | ORAL_TABLET | Freq: Every day | ORAL | 0 refills | Status: DC
Start: 2020-07-13 — End: 2020-08-31

## 2020-07-14 LAB — LIPID PANEL
Chol/HDL Ratio: 2.2 ratio (ref 0.0–4.4)
Cholesterol, Total: 140 mg/dL (ref 100–199)
HDL: 63 mg/dL (ref >39)
LDL Chol Calc (NIH): 65 mg/dL (ref 0–99)
Triglycerides: 53 mg/dL (ref 0–149)
VLDL Cholesterol Cal: 12 mg/dL (ref 5–40)

## 2020-07-14 LAB — HEMOGLOBIN A1C
Est. average glucose Bld gHb Est-mCnc: 97 mg/dL
Hgb A1c MFr Bld: 5 % (ref 4.8–5.6)

## 2020-07-14 LAB — INSULIN, RANDOM: INSULIN: 7.6 u[IU]/mL (ref 2.6–24.9)

## 2020-07-14 LAB — VITAMIN D 25 HYDROXY (VIT D DEFICIENCY, FRACTURES): Vit D, 25-Hydroxy: 52.3 ng/mL (ref 30.0–100.0)

## 2020-07-14 NOTE — Progress Notes (Signed)
Chief Complaint:   OBESITY Tamara Ball is here to discuss her progress with her obesity treatment plan along with follow-up of her obesity related diagnoses. Tamara Ball is on the Category 3 Plan and states she is following her eating plan approximately 0% of the time. Tamara Ball states she is not exercising.  Today's visit was #: 25 Starting weight: 205 lbs Starting date: 06/13/2018 Today's weight: 189 lbs Today's date: 07/13/2020 Total lbs lost to date: 16 Total lbs lost since last in-office visit: 0  Interim History: Tamara Ball's last visit with our office was 02/18/20. She reports that over the last few months, she had COVID as well as food poisoning. Tamara Ball is having sweet cravings and states that she "never gets full". She has been out of her [employer] for a couple of months.   Subjective:   1. Hyperglycemia Tamara Ball has a history of some elevated blood glucose readings without a diagnosis of diabetes. She reports cravings.  2. Vitamin D deficiency Tamara Ball's Vitamin D level was 52.3 on 07/13/20. She has been off vitamin D for the last 2 months. She denies nausea, vomiting or muscle weakness.  3. Other depression, with emotional eating Tamara Ball is struggling with emotional eating and using food for comfort to the extent that it is negatively impacting her health. Tamara Ball reports cravings for chips and sweets. She has been working on behavior modification techniques to help reduce her emotional eating and has been somewhat successful. She shows no sign of suicidal or homicidal ideations.  4. At risk for diabetes mellitus Tamara Ball is at higher than average risk for developing diabetes due to hyperglycemia and obesity.   Assessment/Plan:   1. Hyperglycemia Fasting labs will be obtained and results with be discussed with Tamara Ball in 2 weeks at her follow up visit. In the meanwhile, Tamara Ball was started on a lower simple carbohydrate diet and will work on weight loss efforts.  - Hemoglobin A1c - Insulin,  random - Lipid panel  2. Vitamin D deficiency Low Vitamin D level contributes to fatigue and are associated with obesity, breast, and colon cancer. She agrees to follow-up for routine testing of Vitamin D, at least 2-3 times per year to avoid over-replacement. Labs were ordered today.  - VITAMIN D 25 Hydroxy (Vit-D Deficiency, Fractures)  3. Other depression, with emotional eating  Behavior modification techniques were discussed today to help Tamara Ball.  Orders and follow up as documented in patient record.   - buPROPion (WELLBUTRIN SR) 150 MG 12 hr tablet; Take 1 tablet (150 mg total) by mouth daily.  Dispense: 30 tablet; Refill: 0  4. At risk for diabetes mellitus Good blood sugar control is important to decrease the likelihood of diabetic complications such as nephropathy, neuropathy, limb loss, blindness, coronary artery disease, and death. Intensive lifestyle modification including diet, exercise and weight loss are the first line of treatment for diabetes.  5. Class 1 obesity with serious comorbidity and body mass index (BMI) of 30.0 to 30.9 in adult, unspecified obesity type  - Insulin Pen Needle (BD PEN NEEDLE NANO 2ND GEN) 32G X 4 MM MISC; 1 Package by Does not apply route daily.  Dispense: 100 each; Refill: 0 - Liraglutide -Weight Management (SAXENDA) 18 MG/3ML SOPN; Inject 3 mg into the skin daily.  Dispense: 15 mL; Refill: 0  Tamara Ball is currently in the action stage of change. As such, her goal is to continue with weight loss efforts. She has agreed to the Category 3 Plan.  Exercise goals: No exercise has been prescribed at this time.  Behavioral modification strategies: increasing lean protein intake and decreasing simple carbohydrates.  Tamara Ball has agreed to follow-up with our clinic in 2 weeks and we will recheck her RMR. She was informed of the importance of frequent follow-up visits to maximize her success with intensive  lifestyle modifications for her multiple health conditions.   Tamara Ball was informed we would discuss her lab results at her next visit unless there is a critical issue that needs to be addressed sooner. Tamara Ball agreed to keep her next visit at the agreed upon time to discuss these results.  Objective:   Blood pressure 116/69, pulse 72, temperature 98.6 F (37 C), height 5\' 6"  (1.676 m), weight 189 lb (85.7 kg), SpO2 98 %. Body mass index is 30.51 kg/m.  General: Cooperative, alert, well developed, in no acute distress. HEENT: Conjunctivae and lids unremarkable. Cardiovascular: Regular rhythm.  Lungs: Normal work of breathing. Neurologic: No focal deficits.   Lab Results  Component Value Date   CREATININE 0.89 06/20/2020   BUN 19 06/20/2020   NA 137 06/20/2020   K 3.8 06/20/2020   CL 105 06/20/2020   CO2 23 06/20/2020   Lab Results  Component Value Date   ALT 9 06/20/2020   AST 13 (L) 06/20/2020   ALKPHOS 49 06/20/2020   BILITOT 0.3 06/20/2020   Lab Results  Component Value Date   HGBA1C 5.0 07/13/2020   HGBA1C 5.1 01/27/2020   HGBA1C 4.9 06/17/2019   HGBA1C 5.0 05/27/2018   HGBA1C 5.1 05/22/2017   Lab Results  Component Value Date   INSULIN WILL FOLLOW 07/13/2020   INSULIN 12.6 01/27/2020   INSULIN 9.1 06/17/2019   INSULIN 8.9 06/13/2018   Lab Results  Component Value Date   TSH 2.250 06/13/2018   Lab Results  Component Value Date   CHOL 140 07/13/2020   HDL 63 07/13/2020   LDLCALC 65 07/13/2020   TRIG 53 07/13/2020   CHOLHDL 2.2 07/13/2020   Lab Results  Component Value Date   WBC 8.1 07/01/2020   HGB 13.6 07/01/2020   HCT 42.7 07/01/2020   MCV 90.5 07/01/2020   PLT 184 07/01/2020   Lab Results  Component Value Date   IRON 63 07/01/2020   TIBC 470 (H) 07/01/2020   FERRITIN 24 07/01/2020    Attestation Statements:   Reviewed by clinician on day of visit: allergies, medications, problem list, medical history, surgical history, family history,  social history, and previous encounter notes.   Lenward Chancellor, CMA, am acting as transcriptionist for Abby Potash, PA-C   I have reviewed the above documentation for accuracy and completeness, and I agree with the above. Abby Potash, PA-C

## 2020-07-15 ENCOUNTER — Inpatient Hospital Stay: Payer: BC Managed Care – PPO

## 2020-07-15 ENCOUNTER — Other Ambulatory Visit: Payer: Self-pay

## 2020-07-15 VITALS — BP 108/51 | HR 76 | Temp 97.6°F | Resp 16

## 2020-07-15 DIAGNOSIS — D500 Iron deficiency anemia secondary to blood loss (chronic): Secondary | ICD-10-CM | POA: Diagnosis not present

## 2020-07-15 DIAGNOSIS — N920 Excessive and frequent menstruation with regular cycle: Secondary | ICD-10-CM

## 2020-07-15 MED ORDER — SODIUM CHLORIDE 0.9 % IV SOLN
Freq: Once | INTRAVENOUS | Status: AC
  Filled 2020-07-15: qty 250

## 2020-07-15 MED ORDER — SODIUM CHLORIDE 0.9 % IV SOLN
125.0000 mg | Freq: Once | INTRAVENOUS | Status: AC
  Administered 2020-07-15: 125 mg via INTRAVENOUS
  Filled 2020-07-15: qty 10

## 2020-07-15 NOTE — Patient Instructions (Signed)
Sodium Ferric Gluconate Complex injection What is this medicine? SODIUM FERRIC GLUCONATE COMPLEX (SOE dee um FER ik GLOO koe nate KOM pleks) is an iron replacement. It is used with epoetin therapy to treat low iron levels in patients who are receiving hemodialysis. This medicine may be used for other purposes; ask your health care provider or pharmacist if you have questions. COMMON BRAND NAME(S): Ferrlecit, Nulecit What should I tell my health care provider before I take this medicine? They need to know if you have any of the following conditions:  anemia that is not from iron deficiency  high levels of iron in the body  an unusual or allergic reaction to iron, benzyl alcohol, other medicines, foods, dyes, or preservatives  pregnant or are trying to become pregnant  breast-feeding How should I use this medicine? This medicine is for infusion into a vein. It is given by a health care professional in a hospital or clinic setting. Talk to your pediatrician regarding the use of this medicine in children. While this drug may be prescribed for children as young as 6 years old for selected conditions, precautions do apply. Overdosage: If you think you have taken too much of this medicine contact a poison control center or emergency room at once. NOTE: This medicine is only for you. Do not share this medicine with others. What if I miss a dose? It is important not to miss your dose. Call your doctor or health care professional if you are unable to keep an appointment. What may interact with this medicine? Do not take this medicine with any of the following medications:  deferoxamine  dimercaprol  other iron products This medicine may also interact with the following medications:  chloramphenicol  deferasirox  medicine for blood pressure like enalapril This list may not describe all possible interactions. Give your health care provider a list of all the medicines, herbs,  non-prescription drugs, or dietary supplements you use. Also tell them if you smoke, drink alcohol, or use illegal drugs. Some items may interact with your medicine. What should I watch for while using this medicine? Your condition will be monitored carefully while you are receiving this medicine. Visit your doctor for check-ups as directed. What side effects may I notice from receiving this medicine? Side effects that you should report to your doctor or health care professional as soon as possible:  allergic reactions like skin rash, itching or hives, swelling of the face, lips, or tongue  breathing problems  changes in hearing  changes in vision  chills, flushing, or sweating  fast, irregular heartbeat  feeling faint or lightheaded, falls  fever, flu-like symptoms  high or low blood pressure  pain, tingling, numbness in the hands or feet  severe pain in the chest, back, flanks, or groin  swelling of the ankles, feet, hands  trouble passing urine or change in the amount of urine  unusually weak or tired Side effects that usually do not require medical attention (report to your doctor or health care professional if they continue or are bothersome):  cramps  dark colored stools  diarrhea  headache  nausea, vomiting  stomach upset This list may not describe all possible side effects. Call your doctor for medical advice about side effects. You may report side effects to FDA at 1-800-FDA-1088. Where should I keep my medicine? This drug is given in a hospital or clinic and will not be stored at home. NOTE: This sheet is a summary. It may not cover all   possible information. If you have questions about this medicine, talk to your doctor, pharmacist, or health care provider.  2021 Elsevier/Gold Standard (2007-12-25 15:58:57)   

## 2020-07-21 ENCOUNTER — Telehealth: Payer: BC Managed Care – PPO | Admitting: Family

## 2020-07-21 DIAGNOSIS — B373 Candidiasis of vulva and vagina: Secondary | ICD-10-CM | POA: Diagnosis not present

## 2020-07-21 DIAGNOSIS — B3731 Acute candidiasis of vulva and vagina: Secondary | ICD-10-CM

## 2020-07-21 MED ORDER — FLUCONAZOLE 150 MG PO TABS: 150.0000 mg | ORAL_TABLET | ORAL | 0 refills | Status: DC | PRN

## 2020-07-21 NOTE — Progress Notes (Signed)

## 2020-07-28 ENCOUNTER — Other Ambulatory Visit: Payer: Self-pay

## 2020-07-28 ENCOUNTER — Encounter (INDEPENDENT_AMBULATORY_CARE_PROVIDER_SITE_OTHER): Payer: Self-pay | Admitting: Physician Assistant

## 2020-07-28 ENCOUNTER — Ambulatory Visit (INDEPENDENT_AMBULATORY_CARE_PROVIDER_SITE_OTHER): Payer: BC Managed Care – PPO | Admitting: Physician Assistant

## 2020-07-28 VITALS — BP 100/66 | HR 81 | Temp 97.9°F | Ht 66.0 in | Wt 185.0 lb

## 2020-07-28 DIAGNOSIS — Z6830 Body mass index (BMI) 30.0-30.9, adult: Secondary | ICD-10-CM

## 2020-07-28 DIAGNOSIS — R0602 Shortness of breath: Secondary | ICD-10-CM | POA: Diagnosis not present

## 2020-07-28 DIAGNOSIS — E669 Obesity, unspecified: Secondary | ICD-10-CM | POA: Diagnosis not present

## 2020-07-28 DIAGNOSIS — Z9189 Other specified personal risk factors, not elsewhere classified: Secondary | ICD-10-CM

## 2020-07-28 DIAGNOSIS — R739 Hyperglycemia, unspecified: Secondary | ICD-10-CM

## 2020-08-02 NOTE — Progress Notes (Signed)
Chief Complaint:   OBESITY Tamara Ball is here to discuss her progress with her obesity treatment plan along with follow-up of her obesity related diagnoses. Tamara Ball is on the Category 3 Plan and states she is following her eating plan approximately 80% of the time. Tamara Ball states she is doing 0 minutes 0 times per week.  Today's visit was #: 26 Starting weight: 205 lbs Starting date: 06/13/2018 Today's weight: 185 lbs Today's date: 07/28/2020 Total lbs lost to date: 20 Total lbs lost since last in-office visit: 4  Interim History: Tamara Ball is up to 1.8 mg on Saxenda. She began having diarrhea at the 1.8 mg dose which has now resolved. Her hunger is controlled. She is going out of town for work trips in the next few weeks.  Subjective:   1. Hyperglycemia Tamara Ball's last A1c was 5.0 and insulin at 7.6. She denies polyphagia. I discussed labs with the patient today.  2. SOB (shortness of breath) on exertion Tamara Ball denies dizziness or lightheadedness, or palpitations.  3. At risk for diabetes mellitus Tamara Ball is at higher than average risk for developing diabetes due to obesity.   Assessment/Plan:   1. Hyperglycemia Tamara Ball will continue her meal plan and will work on weight loss efforts.  2. SOB (shortness of breath) on exertion Tamara Ball was repeated today. Tamara Ball has agreed to work on weight loss and gradually increase exercise to treat her exercise induced shortness of breath. Will continue to monitor closely.  3. At risk for diabetes mellitus Tamara Ball was given approximately 15 minutes of diabetes education and counseling today. We discussed intensive lifestyle modifications today with an emphasis on weight loss as well as increasing exercise and decreasing simple carbohydrates in her diet. We also reviewed medication options with an emphasis on risk versus benefit of those discussed.   Repetitive spaced learning was employed today to elicit superior memory formation and behavioral  change.  4. Class 1 obesity with serious comorbidity and body mass index (BMI) of 30.0 to 30.9 in adult, unspecified obesity type Tamara Ball is currently in the action stage of change. As such, her goal is to continue with weight loss efforts. She has agreed to the Category 3 Plan.   Exercise goals: No exercise has been prescribed at this time.  Behavioral modification strategies: meal planning and cooking strategies.  Tamara Ball has agreed to follow-up with our clinic in 2 weeks. She was informed of the importance of frequent follow-up visits to maximize her success with intensive lifestyle modifications for her multiple health conditions.   Objective:   Blood pressure 100/66, pulse 81, temperature 97.9 F (36.6 C), temperature source Oral, height 5\' 6"  (1.676 m), weight 185 lb (83.9 kg), SpO2 98 %. Body mass index is 29.86 kg/m.  General: Cooperative, alert, well developed, in no acute distress. HEENT: Conjunctivae and lids unremarkable. Cardiovascular: Regular rhythm.  Lungs: Normal work of breathing. Neurologic: No focal deficits.   Lab Results  Component Value Date   CREATININE 0.89 06/20/2020   BUN 19 06/20/2020   NA 137 06/20/2020   K 3.8 06/20/2020   CL 105 06/20/2020   CO2 23 06/20/2020   Lab Results  Component Value Date   ALT 9 06/20/2020   AST 13 (L) 06/20/2020   ALKPHOS 49 06/20/2020   BILITOT 0.3 06/20/2020   Lab Results  Component Value Date   HGBA1C 5.0 07/13/2020   HGBA1C 5.1 01/27/2020   HGBA1C 4.9 06/17/2019   HGBA1C 5.0 05/27/2018   HGBA1C 5.1 05/22/2017  Lab Results  Component Value Date   INSULIN 7.6 07/13/2020   INSULIN 12.6 01/27/2020   INSULIN 9.1 06/17/2019   INSULIN 8.9 06/13/2018   Lab Results  Component Value Date   TSH 2.250 06/13/2018   Lab Results  Component Value Date   CHOL 140 07/13/2020   HDL 63 07/13/2020   LDLCALC 65 07/13/2020   TRIG 53 07/13/2020   CHOLHDL 2.2 07/13/2020   Lab Results  Component Value Date   WBC 8.1  07/01/2020   HGB 13.6 07/01/2020   HCT 42.7 07/01/2020   MCV 90.5 07/01/2020   PLT 184 07/01/2020   Lab Results  Component Value Date   IRON 63 07/01/2020   TIBC 470 (H) 07/01/2020   FERRITIN 24 07/01/2020   Attestation Statements:   Reviewed by clinician on day of visit: allergies, medications, problem list, medical history, surgical history, family history, social history, and previous encounter notes.   Wilhemena Durie, am acting as transcriptionist for [employer], PA-C.  I have reviewed the above documentation for accuracy and completeness, and I agree with the above. Abby Potash, PA-C

## 2020-08-12 ENCOUNTER — Ambulatory Visit (INDEPENDENT_AMBULATORY_CARE_PROVIDER_SITE_OTHER): Payer: BC Managed Care – PPO | Admitting: Physician Assistant

## 2020-08-31 ENCOUNTER — Other Ambulatory Visit: Payer: Self-pay

## 2020-08-31 ENCOUNTER — Encounter (INDEPENDENT_AMBULATORY_CARE_PROVIDER_SITE_OTHER): Payer: Self-pay | Admitting: Physician Assistant

## 2020-08-31 ENCOUNTER — Ambulatory Visit (INDEPENDENT_AMBULATORY_CARE_PROVIDER_SITE_OTHER): Payer: BC Managed Care – PPO | Admitting: Physician Assistant

## 2020-08-31 VITALS — BP 118/69 | HR 76 | Temp 98.1°F | Ht 66.0 in | Wt 191.0 lb

## 2020-08-31 DIAGNOSIS — F3289 Other specified depressive episodes: Secondary | ICD-10-CM

## 2020-08-31 DIAGNOSIS — Z9189 Other specified personal risk factors, not elsewhere classified: Secondary | ICD-10-CM

## 2020-08-31 DIAGNOSIS — E669 Obesity, unspecified: Secondary | ICD-10-CM | POA: Diagnosis not present

## 2020-08-31 DIAGNOSIS — Z6830 Body mass index (BMI) 30.0-30.9, adult: Secondary | ICD-10-CM | POA: Diagnosis not present

## 2020-08-31 DIAGNOSIS — E559 Vitamin D deficiency, unspecified: Secondary | ICD-10-CM

## 2020-08-31 MED ORDER — SAXENDA 18 MG/3ML ~~LOC~~ SOPN: 3.0000 mg | PEN_INJECTOR | Freq: Every day | SUBCUTANEOUS | 0 refills | Status: DC

## 2020-08-31 MED ORDER — BD PEN NEEDLE NANO 2ND GEN 32G X 4 MM MISC: 1.0000 | Freq: Every day | 0 refills | Status: DC

## 2020-08-31 MED ORDER — BUPROPION HCL ER (SR) 200 MG PO TB12: 200.0000 mg | ORAL_TABLET | Freq: Every day | ORAL | 0 refills | Status: DC

## 2020-09-01 NOTE — Progress Notes (Signed)
Chief Complaint:   OBESITY Tamara Ball is here to discuss her progress with her obesity treatment plan along with follow-up of her obesity related diagnoses. Tamara Ball is on the Category 3 Plan and states she is following her eating plan approximately 65% of the time. Tamara Ball states she is doing 0 minutes 0 times per week.  Today's visit was #: 21 Starting weight: 205 lbs Starting date: 06/13/2018 Today's weight: 191 lbs Today's date: 08/31/2020 Total lbs lost to date: 14 Total lbs lost since last in-office visit: 0  Interim History: Tamara Ball is not taking Saxenda due to nausea. She was out of town more often recently. She finds herself excessively hungry when she is not taking Saxenda.  Subjective:   1. Other depression, with emotional eating  Tamara Ball reports Wellbutrin is not helping with cravings.  2. At risk for osteoporosis Tamara Ball is at higher risk of osteopenia and osteoporosis due to Vitamin D deficiency.   Assessment/Plan:   1. Other depression, with emotional eating  Behavior modification techniques were discussed today to help Tamara Ball deal with her emotional/non-hunger eating behaviors. Tamara Ball agreed to increase Wellbutrin SR to 200 mg daily with no refills. Orders and follow up as documented in patient record.   - buPROPion (WELLBUTRIN SR) 200 MG 12 hr tablet; Take 1 tablet (200 mg total) by mouth daily.  Dispense: 30 tablet; Refill: 0  2. At risk for osteoporosis Tamara Ball was given approximately 15 minutes of osteoporosis prevention counseling today. Tamara Ball is at risk for osteopenia and osteoporosis due to her Vitamin D deficiency. She was encouraged to take her Vitamin D and follow her higher calcium diet and increase strengthening exercise to help strengthen her bones and decrease her risk of osteopenia and osteoporosis.  Repetitive spaced learning was employed today to elicit superior memory formation and behavioral change.  3. Class 1 obesity with serious comorbidity and body  mass index (BMI) of 30.0 to 30.9 in adult, unspecified obesity type Tamara Ball is currently in the action stage of change. As such, her goal is to continue with weight loss efforts. She has agreed to the Category 3 Plan.   We discussed various medication options to help Tamara Ball with her weight loss efforts and we both agreed to continues Saxenda, and we will refill for 1 month, and refill pen needles #100 with no refills.  - Liraglutide -Weight Management (SAXENDA) 18 MG/3ML SOPN; Inject 3 mg into the skin daily.  Dispense: 15 mL; Refill: 0 - Insulin Pen Needle (BD PEN NEEDLE NANO 2ND GEN) 32G X 4 MM MISC; 1 Package by Does not apply route daily.  Dispense: 100 each; Refill: 0  Exercise goals: No exercise has been prescribed at this time.  Behavioral modification strategies: meal planning and cooking strategies and planning for success.  Tamara Ball has agreed to follow-up with our clinic in 2 weeks. She was informed of the importance of frequent follow-up visits to maximize her success with intensive lifestyle modifications for her multiple health conditions.   Objective:   Blood pressure 118/69, pulse 76, temperature 98.1 F (36.7 C), height 5\' 6"  (1.676 m), weight 191 lb (86.6 kg), SpO2 100 %. Body mass index is 30.83 kg/m.  General: Cooperative, alert, well developed, in no acute distress. HEENT: Conjunctivae and lids unremarkable. Cardiovascular: Regular rhythm.  Lungs: Normal work of breathing. Neurologic: No focal deficits.   Lab Results  Component Value Date   CREATININE 0.89 06/20/2020   BUN 19 06/20/2020   NA 137 06/20/2020   K  3.8 06/20/2020   CL 105 06/20/2020   CO2 23 06/20/2020   Lab Results  Component Value Date   ALT 9 06/20/2020   AST 13 (L) 06/20/2020   ALKPHOS 49 06/20/2020   BILITOT 0.3 06/20/2020   Lab Results  Component Value Date   HGBA1C 5.0 07/13/2020   HGBA1C 5.1 01/27/2020   HGBA1C 4.9 06/17/2019   HGBA1C 5.0 05/27/2018   HGBA1C 5.1 05/22/2017   Lab  Results  Component Value Date   INSULIN 7.6 07/13/2020   INSULIN 12.6 01/27/2020   INSULIN 9.1 06/17/2019   INSULIN 8.9 06/13/2018   Lab Results  Component Value Date   TSH 2.250 06/13/2018   Lab Results  Component Value Date   CHOL 140 07/13/2020   HDL 63 07/13/2020   LDLCALC 65 07/13/2020   TRIG 53 07/13/2020   CHOLHDL 2.2 07/13/2020   Lab Results  Component Value Date   WBC 8.1 07/01/2020   HGB 13.6 07/01/2020   HCT 42.7 07/01/2020   MCV 90.5 07/01/2020   PLT 184 07/01/2020   Lab Results  Component Value Date   IRON 63 07/01/2020   TIBC 470 (H) 07/01/2020   FERRITIN 24 07/01/2020   Attestation Statements:   Reviewed by clinician on day of visit: allergies, medications, problem list, medical history, surgical history, family history, social history, and previous encounter notes.   Wilhemena Durie, am acting as transcriptionist for [employer], PA-C.  I have reviewed the above documentation for accuracy and completeness, and I agree with the above. Abby Potash, PA-C

## 2020-09-05 ENCOUNTER — Telehealth: Payer: BC Managed Care – PPO | Admitting: Nurse Practitioner

## 2020-09-05 DIAGNOSIS — J309 Allergic rhinitis, unspecified: Secondary | ICD-10-CM

## 2020-09-05 MED ORDER — FLUTICASONE PROPIONATE 50 MCG/ACT NA SUSP: 2.0000 | Freq: Every day | NASAL | 0 refills | Status: DC

## 2020-09-05 MED ORDER — CETIRIZINE HCL 10 MG PO TABS: 10.0000 mg | ORAL_TABLET | Freq: Every day | ORAL | 0 refills | Status: DC

## 2020-09-05 NOTE — Progress Notes (Signed)
E visit for Allergic Rhinitis We are sorry that you are not feeling well.  Here is how we plan to help!  Based on what you have shared with me it looks like you have Allergic Rhinitis.  Rhinitis is when a reaction occurs that causes nasal congestion, runny nose, sneezing, and itching.  Most types of rhinitis are caused by an inflammation and are associated with symptoms in the eyes ears or throat. There are several types of rhinitis.  The most common are acute rhinitis, which is usually caused by a viral illness, allergic or seasonal rhinitis, and nonallergic or year-round rhinitis.  Nasal allergies occur certain times of the year.  Allergic rhinitis is caused when allergens in the air trigger the release of histamine in the body.  Histamine causes itching, swelling, and fluid to build up in the fragile linings of the nasal passages, sinuses and eyelids.  An itchy nose and clear discharge are common.  I recommend the following over the counter treatments: Ceterizine 10 mg daily.    I also would recommend a nasal spray: Flonase 2 sprays into each nostril once daily  You may continue Xyzal. You may take Ceterizine daily in the mornings and Xyzal at bedtime.  When you are outside, you should continue to wear your mask. Also after coming from outdoors, you should shower and wash your hair.  Steroids are not used to treat allergy symptoms.   HOME CARE:   You can use an over-the-counter saline nasal spray as needed  Avoid areas where there is heavy dust, mites, or molds  Stay indoors on windy days during the pollen season  Keep windows closed in home, at least in bedroom; use air conditioner.  Use high-efficiency house air filter  Keep windows closed in car, turn AC on re-circulate  Avoid playing out with dog during pollen season  GET HELP RIGHT AWAY IF:   If your symptoms do not improve within 10 days  You become short of breath  You develop yellow or green discharge from your  nose for over 3 days  You have coughing fits  MAKE SURE YOU:   Understand these instructions  Will watch your condition  Will get help right away if you are not doing well or get worse  Thank you for choosing an e-visit. Your e-visit answers were reviewed by a board certified advanced clinical practitioner to complete your personal care plan. Depending upon the condition, your plan could have included both over the counter or prescription medications. Please review your pharmacy choice. Be sure that the pharmacy you have chosen is open so that you can pick up your prescription now.  If there is a problem you may message your provider in Early to have the prescription routed to another pharmacy. Your safety is important to [name]. If you have drug allergies check your prescription carefully.  For the next 24 hours, you can use MyChart to ask questions about today's visit, request a non-urgent call back, or ask for a work or school excuse from your e-visit provider. You will get an email in the next two days asking about your experience. I hope that your e-visit has been valuable and will speed your recovery.  I have spent at least 5 minutes reviewing and documenting in the patient's chart.

## 2020-09-20 ENCOUNTER — Ambulatory Visit (INDEPENDENT_AMBULATORY_CARE_PROVIDER_SITE_OTHER): Payer: BC Managed Care – PPO | Admitting: Physician Assistant

## 2020-09-20 ENCOUNTER — Encounter (INDEPENDENT_AMBULATORY_CARE_PROVIDER_SITE_OTHER): Payer: Self-pay | Admitting: Physician Assistant

## 2020-09-20 ENCOUNTER — Other Ambulatory Visit: Payer: Self-pay

## 2020-09-20 VITALS — BP 106/67 | HR 74 | Temp 98.3°F | Ht 66.0 in | Wt 184.0 lb

## 2020-09-20 DIAGNOSIS — E669 Obesity, unspecified: Secondary | ICD-10-CM

## 2020-09-20 DIAGNOSIS — E8881 Metabolic syndrome: Secondary | ICD-10-CM

## 2020-09-20 DIAGNOSIS — Z6830 Body mass index (BMI) 30.0-30.9, adult: Secondary | ICD-10-CM | POA: Diagnosis not present

## 2020-09-20 DIAGNOSIS — Z9189 Other specified personal risk factors, not elsewhere classified: Secondary | ICD-10-CM

## 2020-09-20 DIAGNOSIS — F3289 Other specified depressive episodes: Secondary | ICD-10-CM

## 2020-09-20 MED ORDER — BUPROPION HCL ER (SR) 150 MG PO TB12: 150.0000 mg | ORAL_TABLET | Freq: Two times a day (BID) | ORAL | 0 refills | Status: DC

## 2020-09-20 NOTE — Progress Notes (Signed)
Chief Complaint:   OBESITY Tamara Ball is here to discuss her progress with her obesity treatment plan along with follow-up of her obesity related diagnoses. Tamara Ball is on the Category 3 Plan and states she is following her eating plan approximately 75% of the time. Tamara Ball states she is doing weights and cardio for 30 minutes 2 times per week.  Today's visit was #: 28 Starting weight: 205 lbs Starting date: 06/13/2020 Today's weight: 184 lbs Today's date: 09/20/2020 Total lbs lost to date: 21 Total lbs lost since last in-office visit: 7  Interim History: Tamara Ball did very well with weight loss. She reports that she is up to 2.4 mg of Saxenda. Her hunger is controlled on some days and other days it is not. She is traveling to [place] coming up.  Subjective:   1. Insulin resistance Tamara Ball is not on medications, and she reports intermittent polyphagia. Last insulin level was 7.6.  2. Other depression, with emotional eating  Tamara Ball is on bupropion 200 mg daily, and she is unsure if it is helping. She can not tell if she is hungry or having cravings.  3. At risk for diabetes mellitus Tamara Ball is at higher than average risk for developing diabetes due to obesity.   Assessment/Plan:   1. Insulin resistance Kinesha will continue her meal plan, exercise, and decreasing simple carbohydrates to help decrease the risk of diabetes. Tamara Ball agreed to follow-up with [name] as directed to closely monitor her progress.  2. Other depression, with emotional eating  Behavior modification techniques were discussed today to help Tamara Ball deal with her emotional/non-hunger eating behaviors. Tamara Ball agreed to increase Wellbutrin SR to 150 mg BID with no refills. Orders and follow up as documented in patient record.   - buPROPion (WELLBUTRIN SR) 150 MG 12 hr tablet; Take 1 tablet (150 mg total) by mouth 2 (two) times daily.  Dispense: 60 tablet; Refill: 0  3. At risk for diabetes mellitus Tamara Ball was given  approximately 15 minutes of diabetes education and counseling today. We discussed intensive lifestyle modifications today with an emphasis on weight loss as well as increasing exercise and decreasing simple carbohydrates in her diet. We also reviewed medication options with an emphasis on risk versus benefit of those discussed.   Repetitive spaced learning was employed today to elicit superior memory formation and behavioral change.  4. Class 1 obesity with serious comorbidity and body mass index (BMI) of 30.0 to 30.9 in adult, unspecified obesity type Tamara Ball is currently in the action stage of change. As such, her goal is to continue with weight loss efforts. She has agreed to the Category 3 Plan.   Exercise goals: As is.  Behavioral modification strategies: decreasing simple carbohydrates and meal planning and cooking strategies.  Tamara Ball has agreed to follow-up with our clinic in 2 to 3 weeks. She was informed of the importance of frequent follow-up visits to maximize her success with intensive lifestyle modifications for her multiple health conditions.   Objective:   Blood pressure 106/67, pulse 74, temperature 98.3 F (36.8 C), height 5\' 6"  (1.676 m), weight 184 lb (83.5 kg), last menstrual period 09/01/2020, SpO2 100 %. Body mass index is 29.7 kg/m.  General: Cooperative, alert, well developed, in no acute distress. HEENT: Conjunctivae and lids unremarkable. Cardiovascular: Regular rhythm.  Lungs: Normal work of breathing. Neurologic: No focal deficits.   Lab Results  Component Value Date   CREATININE 0.89 06/20/2020   BUN 19 06/20/2020   NA 137 06/20/2020  K 3.8 06/20/2020   CL 105 06/20/2020   CO2 23 06/20/2020   Lab Results  Component Value Date   ALT 9 06/20/2020   AST 13 (L) 06/20/2020   ALKPHOS 49 06/20/2020   BILITOT 0.3 06/20/2020   Lab Results  Component Value Date   HGBA1C 5.0 07/13/2020   HGBA1C 5.1 01/27/2020   HGBA1C 4.9 06/17/2019   HGBA1C 5.0  05/27/2018   HGBA1C 5.1 05/22/2017   Lab Results  Component Value Date   INSULIN 7.6 07/13/2020   INSULIN 12.6 01/27/2020   INSULIN 9.1 06/17/2019   INSULIN 8.9 06/13/2018   Lab Results  Component Value Date   TSH 2.250 06/13/2018   Lab Results  Component Value Date   CHOL 140 07/13/2020   HDL 63 07/13/2020   LDLCALC 65 07/13/2020   TRIG 53 07/13/2020   CHOLHDL 2.2 07/13/2020   Lab Results  Component Value Date   WBC 8.1 07/01/2020   HGB 13.6 07/01/2020   HCT 42.7 07/01/2020   MCV 90.5 07/01/2020   PLT 184 07/01/2020   Lab Results  Component Value Date   IRON 63 07/01/2020   TIBC 470 (H) 07/01/2020   FERRITIN 24 07/01/2020   Attestation Statements:   Reviewed by clinician on day of visit: allergies, medications, problem list, medical history, surgical history, family history, social history, and previous encounter notes.   Wilhemena Durie, am acting as transcriptionist for [employer], PA-C.  I have reviewed the above documentation for accuracy and completeness, and I agree with the above. Abby Potash, PA-C

## 2020-10-13 ENCOUNTER — Telehealth: Payer: BC Managed Care – PPO | Admitting: Nurse Practitioner

## 2020-10-13 DIAGNOSIS — N3000 Acute cystitis without hematuria: Secondary | ICD-10-CM | POA: Diagnosis not present

## 2020-10-13 MED ORDER — CEPHALEXIN 500 MG PO CAPS: 500.0000 mg | ORAL_CAPSULE | Freq: Two times a day (BID) | ORAL | 0 refills | Status: DC

## 2020-10-13 NOTE — Progress Notes (Signed)

## 2020-10-20 MED ORDER — FLUCONAZOLE 150 MG PO TABS: 150.0000 mg | ORAL_TABLET | Freq: Once | ORAL | 0 refills | Status: AC

## 2020-10-20 NOTE — Addendum Note (Signed)
Addended by: Brunetta Jeans on: 10/20/2020 06:39 PM   Modules accepted: Orders

## 2020-10-27 ENCOUNTER — Other Ambulatory Visit: Payer: Self-pay

## 2020-10-27 ENCOUNTER — Encounter (INDEPENDENT_AMBULATORY_CARE_PROVIDER_SITE_OTHER): Payer: Self-pay | Admitting: Physician Assistant

## 2020-10-27 ENCOUNTER — Ambulatory Visit (INDEPENDENT_AMBULATORY_CARE_PROVIDER_SITE_OTHER): Payer: BC Managed Care – PPO | Admitting: Physician Assistant

## 2020-10-27 VITALS — BP 104/73 | HR 73 | Temp 98.4°F | Ht 66.0 in | Wt 187.0 lb

## 2020-10-27 DIAGNOSIS — Z9189 Other specified personal risk factors, not elsewhere classified: Secondary | ICD-10-CM

## 2020-10-27 DIAGNOSIS — E669 Obesity, unspecified: Secondary | ICD-10-CM | POA: Diagnosis not present

## 2020-10-27 DIAGNOSIS — E559 Vitamin D deficiency, unspecified: Secondary | ICD-10-CM | POA: Diagnosis not present

## 2020-10-27 DIAGNOSIS — F3289 Other specified depressive episodes: Secondary | ICD-10-CM | POA: Diagnosis not present

## 2020-10-27 DIAGNOSIS — Z6830 Body mass index (BMI) 30.0-30.9, adult: Secondary | ICD-10-CM

## 2020-10-27 MED ORDER — BUPROPION HCL ER (SR) 150 MG PO TB12: 150.0000 mg | ORAL_TABLET | Freq: Two times a day (BID) | ORAL | 0 refills | Status: DC

## 2020-10-27 MED ORDER — SAXENDA 18 MG/3ML ~~LOC~~ SOPN
3.0000 mg | PEN_INJECTOR | Freq: Every day | SUBCUTANEOUS | 0 refills | Status: DC
Start: 2020-10-27 — End: 2020-11-22

## 2020-11-02 NOTE — Progress Notes (Signed)
Chief Complaint:   OBESITY Tamara Ball is here to discuss her progress with her obesity treatment plan along with follow-up of her obesity related diagnoses. Tamara Ball is on the Category 3 Plan and states she is following her eating plan approximately 50% of the time. Tamara Ball states she is doing 0 minutes 0 times per week.  Today's visit was #: 6 Starting weight: 205 lbs Starting date: 06/13/2020 Today's weight: 187 lbs Today's date: 10/27/2020 Total lbs lost to date: 18 Total lbs lost since last in-office visit: 0  Interim History: Tamara Ball has been traveling a lot for work and vacation, and she ate out all of the time. Saxenda 3.0 mg helps her portion control. Her hunger is controlled. She is not a breakfast person, and she is looking for more ideas.  Subjective:   1. Vitamin D deficiency Tamara Ball denies nausea, vomiting, and muscle weakness. She is on OTC Vit D.  2. Other depression, with emotional eating  Tamara Ball notes her cravings have improved on bupropion. Her blood pressure is controlled.  3. At risk for impaired metabolic function Tamara Ball is at increased risk for impaired metabolic function due to skipping breakfast, and not eating enough throughout the day.  Assessment/Plan:   1. Vitamin D deficiency Low Vitamin D level contributes to fatigue and are associated with obesity, breast, and colon cancer. Tamara Ball will continue OTC Vitamin D and will follow-up for routine testing of Vitamin D, at least 2-3 times per year to avoid over-replacement.  2. Other depression, with emotional eating  Behavior modification techniques were discussed today to help Tamara Ball deal with her emotional/non-hunger eating behaviors. We will refill bupropion for 1 month. Orders and follow up as documented in patient record.   - buPROPion (WELLBUTRIN SR) 150 MG 12 hr tablet; Take 1 tablet (150 mg total) by mouth 2 (two) times daily.  Dispense: 60 tablet; Refill: 0  3. At risk for impaired metabolic  function Tamara Ball was given approximately 15 minutes of impaired  metabolic function prevention counseling today. We discussed intensive lifestyle modifications today with an emphasis on specific nutrition and exercise instructions and strategies.   Repetitive spaced learning was employed today to elicit superior memory formation and behavioral change.  4. Class 1 obesity with serious comorbidity and body mass index (BMI) of 30.0 to 30.9 in adult, unspecified obesity type Tamara Ball is currently in the action stage of change. As such, her goal is to continue with weight loss efforts. She has agreed to the Category 3 Plan and keeping a food journal and adhering to recommended goals of 300-400 calories and 30 grams of protein at breakfast daily.   We discussed various medication options to help Tamara Ball with her weight loss efforts and we both agreed to continue Saxenda, and we will refill for 1 month.  - Liraglutide -Weight Management (SAXENDA) 18 MG/3ML SOPN; Inject 3 mg into the skin daily.  Dispense: 15 mL; Refill: 0  Exercise goals: No exercise has been prescribed at this time.  Behavioral modification strategies: meal planning and cooking strategies.  Tamara Ball has agreed to follow-up with our clinic in 3 weeks. She was informed of the importance of frequent follow-up visits to maximize her success with intensive lifestyle modifications for her multiple health conditions.   Objective:   Blood pressure 104/73, pulse 73, temperature 98.4 F (36.9 C), height 5\' 6"  (1.676 m), weight 187 lb (84.8 kg), last menstrual period 10/25/2020, SpO2 100 %. Body mass index is 30.18 kg/m.  General: Cooperative, alert, well  developed, in no acute distress. HEENT: Conjunctivae and lids unremarkable. Cardiovascular: Regular rhythm.  Lungs: Normal work of breathing. Neurologic: No focal deficits.   Lab Results  Component Value Date   CREATININE 0.89 06/20/2020   BUN 19 06/20/2020   NA 137 06/20/2020   K 3.8  06/20/2020   CL 105 06/20/2020   CO2 23 06/20/2020   Lab Results  Component Value Date   ALT 9 06/20/2020   AST 13 (L) 06/20/2020   ALKPHOS 49 06/20/2020   BILITOT 0.3 06/20/2020   Lab Results  Component Value Date   HGBA1C 5.0 07/13/2020   HGBA1C 5.1 01/27/2020   HGBA1C 4.9 06/17/2019   HGBA1C 5.0 05/27/2018   HGBA1C 5.1 05/22/2017   Lab Results  Component Value Date   INSULIN 7.6 07/13/2020   INSULIN 12.6 01/27/2020   INSULIN 9.1 06/17/2019   INSULIN 8.9 06/13/2018   Lab Results  Component Value Date   TSH 2.250 06/13/2018   Lab Results  Component Value Date   CHOL 140 07/13/2020   HDL 63 07/13/2020   LDLCALC 65 07/13/2020   TRIG 53 07/13/2020   CHOLHDL 2.2 07/13/2020   Lab Results  Component Value Date   WBC 8.1 07/01/2020   HGB 13.6 07/01/2020   HCT 42.7 07/01/2020   MCV 90.5 07/01/2020   PLT 184 07/01/2020   Lab Results  Component Value Date   IRON 63 07/01/2020   TIBC 470 (H) 07/01/2020   FERRITIN 24 07/01/2020   Attestation Statements:   Reviewed by clinician on day of visit: allergies, medications, problem list, medical history, surgical history, family history, social history, and previous encounter notes.   Wilhemena Durie, am acting as transcriptionist for [employer], PA-C.  I have reviewed the above documentation for accuracy and completeness, and I agree with the above. Abby Potash, PA-C

## 2020-11-08 ENCOUNTER — Other Ambulatory Visit (INDEPENDENT_AMBULATORY_CARE_PROVIDER_SITE_OTHER): Payer: Self-pay | Admitting: Physician Assistant

## 2020-11-08 ENCOUNTER — Encounter: Payer: Self-pay | Admitting: Family Medicine

## 2020-11-08 DIAGNOSIS — Z6830 Body mass index (BMI) 30.0-30.9, adult: Secondary | ICD-10-CM

## 2020-11-09 MED ORDER — LEVOCETIRIZINE DIHYDROCHLORIDE 5 MG PO TABS
5.0000 mg | ORAL_TABLET | Freq: Every evening | ORAL | 0 refills | Status: DC
Start: 2020-11-09 — End: 2021-02-18

## 2020-11-09 MED ORDER — BD PEN NEEDLE NANO 2ND GEN 32G X 4 MM MISC: 1.0000 | Freq: Every day | 0 refills | Status: DC

## 2020-11-09 MED ORDER — MONTELUKAST SODIUM 10 MG PO TABS: 10.0000 mg | ORAL_TABLET | Freq: Every day | ORAL | 0 refills | Status: DC

## 2020-11-09 NOTE — Telephone Encounter (Signed)
Last OV with Tracey 

## 2020-11-17 ENCOUNTER — Ambulatory Visit (INDEPENDENT_AMBULATORY_CARE_PROVIDER_SITE_OTHER): Payer: BC Managed Care – PPO | Admitting: Physician Assistant

## 2020-11-22 ENCOUNTER — Other Ambulatory Visit: Payer: Self-pay

## 2020-11-22 ENCOUNTER — Ambulatory Visit (INDEPENDENT_AMBULATORY_CARE_PROVIDER_SITE_OTHER): Payer: BC Managed Care – PPO | Admitting: Physician Assistant

## 2020-11-22 VITALS — BP 116/70 | HR 78 | Temp 98.5°F | Ht 66.0 in | Wt 188.0 lb

## 2020-11-22 DIAGNOSIS — Z9189 Other specified personal risk factors, not elsewhere classified: Secondary | ICD-10-CM | POA: Diagnosis not present

## 2020-11-22 DIAGNOSIS — Z6830 Body mass index (BMI) 30.0-30.9, adult: Secondary | ICD-10-CM | POA: Diagnosis not present

## 2020-11-22 DIAGNOSIS — E669 Obesity, unspecified: Secondary | ICD-10-CM

## 2020-11-22 DIAGNOSIS — E8881 Metabolic syndrome: Secondary | ICD-10-CM

## 2020-11-22 MED ORDER — SAXENDA 18 MG/3ML ~~LOC~~ SOPN
3.0000 mg | PEN_INJECTOR | Freq: Every day | SUBCUTANEOUS | 0 refills | Status: DC
Start: 2020-11-22 — End: 2021-05-18

## 2020-11-30 ENCOUNTER — Encounter (INDEPENDENT_AMBULATORY_CARE_PROVIDER_SITE_OTHER): Payer: Self-pay

## 2020-11-30 NOTE — Progress Notes (Signed)
Chief Complaint:   OBESITY Tamara Ball is here to discuss her progress with her obesity treatment plan along with follow-up of her obesity related diagnoses. Tamara Ball is on the Category 3 Plan and keeping a food journal and adhering to recommended goals of 300-400 calories and 30 grams of protein at breakfast daily and states she is following her eating plan approximately 40-50% of the time. Tamara Ball states she is doing weights and cardio for 45-60 minutes 2 times per week.  Today's visit was #: 42 Starting weight: 205 lbs Starting date: 06/13/2020 Today's weight: 188 lbs Today's date: 11/22/2020 Total lbs lost to date: 17 Total lbs lost since last in-office visit: 0  Interim History: Tamara Ball reports that there were some days she forgot to use her Saxenda and she found herself excessively hungry those days. There are also some days that she feels like her Kirke Shaggy helps and some days it does not.  Subjective:   1. Insulin resistance Tamara Ball reports polyphagia on Saxenda.  2. At risk for diabetes mellitus Tamara Ball is at higher than average risk for developing diabetes due to obesity.   Assessment/Plan:   1. Insulin resistance Tamara Ball will continue her meal plan and increase protein, and she continue to work on weight loss, exercise, and decreasing simple carbohydrates to help decrease the risk of diabetes. Tamara Ball agreed to follow-up with [name] as directed to closely monitor her progress.  2. At risk for diabetes mellitus Tamara Ball was given approximately 15 minutes of diabetes education and counseling today. We discussed intensive lifestyle modifications today with an emphasis on weight loss as well as increasing exercise and decreasing simple carbohydrates in her diet. We also reviewed medication options with an emphasis on risk versus benefit of those discussed.   Repetitive spaced learning was employed today to elicit superior memory formation and behavioral change.  3. Class 1 obesity with  serious comorbidity and body mass index (BMI) of 30.0 to 30.9 in adult, unspecified obesity type, , current bmi 30.36 Tamara Ball is currently in the action stage of change. As such, her goal is to continue with weight loss efforts. She has agreed to the Category 3 Plan.   Tamara Ball is to add protein yogurt and and tuna pack to the meal plan.   We discussed various medication options to help [facility] with her weight loss efforts and we both agreed to continue Saxenda 3 mg, and we will refill for 1 month.  - Liraglutide -Weight Management (SAXENDA) 18 MG/3ML SOPN; Inject 3 mg into the skin daily.  Dispense: 15 mL; Refill: 0  Exercise goals: As is.  Behavioral modification strategies: meal planning and cooking strategies and keeping healthy foods in the home.  Tamara Ball has agreed to follow-up with our clinic in 3 weeks. She was informed of the importance of frequent follow-up visits to maximize her success with intensive lifestyle modifications for her multiple health conditions.   Objective:   Blood pressure 116/70, pulse 78, temperature 98.5 F (36.9 C), height '5\' 6"'$  (1.676 m), weight 188 lb (85.3 kg), last menstrual period 10/23/2020, SpO2 100 %. Body mass index is 30.34 kg/m.  General: Cooperative, alert, well developed, in no acute distress. HEENT: Conjunctivae and lids unremarkable. Cardiovascular: Regular rhythm.  Lungs: Normal work of breathing. Neurologic: No focal deficits.   Lab Results  Component Value Date   CREATININE 0.89 06/20/2020   BUN 19 06/20/2020   NA 137 06/20/2020   K 3.8 06/20/2020   CL 105 06/20/2020   CO2 23 06/20/2020  Lab Results  Component Value Date   ALT 9 06/20/2020   AST 13 (L) 06/20/2020   ALKPHOS 49 06/20/2020   BILITOT 0.3 06/20/2020   Lab Results  Component Value Date   HGBA1C 5.0 07/13/2020   HGBA1C 5.1 01/27/2020   HGBA1C 4.9 06/17/2019   HGBA1C 5.0 05/27/2018   HGBA1C 5.1 05/22/2017   Lab Results  Component Value Date   INSULIN 7.6  07/13/2020   INSULIN 12.6 01/27/2020   INSULIN 9.1 06/17/2019   INSULIN 8.9 06/13/2018   Lab Results  Component Value Date   TSH 2.250 06/13/2018   Lab Results  Component Value Date   CHOL 140 07/13/2020   HDL 63 07/13/2020   LDLCALC 65 07/13/2020   TRIG 53 07/13/2020   CHOLHDL 2.2 07/13/2020   Lab Results  Component Value Date   VD25OH 52.3 07/13/2020   VD25OH 104.0 (H) 01/27/2020   VD25OH 53.6 06/17/2019   Lab Results  Component Value Date   WBC 8.1 07/01/2020   HGB 13.6 07/01/2020   HCT 42.7 07/01/2020   MCV 90.5 07/01/2020   PLT 184 07/01/2020   Lab Results  Component Value Date   IRON 63 07/01/2020   TIBC 470 (H) 07/01/2020   FERRITIN 24 07/01/2020   Attestation Statements:   Reviewed by clinician on day of visit: allergies, medications, problem list, medical history, surgical history, family history, social history, and previous encounter notes.   Tamara Ball, am acting as transcriptionist for [employer], PA-C.  I have reviewed the above documentation for accuracy and completeness, and I agree with the above. Tamara Potash, PA-C

## 2020-12-09 ENCOUNTER — Encounter (INDEPENDENT_AMBULATORY_CARE_PROVIDER_SITE_OTHER): Payer: Self-pay

## 2020-12-14 ENCOUNTER — Ambulatory Visit (INDEPENDENT_AMBULATORY_CARE_PROVIDER_SITE_OTHER): Payer: BC Managed Care – PPO | Admitting: Physician Assistant

## 2020-12-15 ENCOUNTER — Ambulatory Visit: Payer: BC Managed Care – PPO | Admitting: Family Medicine

## 2020-12-27 ENCOUNTER — Encounter: Payer: Self-pay | Admitting: Emergency Medicine

## 2020-12-27 ENCOUNTER — Ambulatory Visit
Admission: EM | Admit: 2020-12-27 | Discharge: 2020-12-27 | Disposition: A | Discharge status: Home / Self Care | Payer: BC Managed Care – PPO | Attending: Internal Medicine | Admitting: Internal Medicine

## 2020-12-27 ENCOUNTER — Telehealth: Payer: Self-pay

## 2020-12-27 DIAGNOSIS — H9202 Otalgia, left ear: Secondary | ICD-10-CM

## 2020-12-27 DIAGNOSIS — H65192 Other acute nonsuppurative otitis media, left ear: Secondary | ICD-10-CM | POA: Diagnosis not present

## 2020-12-27 MED ORDER — AMOXICILLIN-POT CLAVULANATE 875-125 MG PO TABS: 1.0000 | ORAL_TABLET | Freq: Two times a day (BID) | ORAL | 0 refills | Status: DC

## 2020-12-27 NOTE — ED Provider Notes (Signed)
[facility]    CSN: [id number] Arrival date & time: 12/27/20  1857      History   Chief Complaint Chief Complaint  Patient presents with   Otalgia    HPI Tamara Ball is a 40 y.o. female.   Patient presents with left ear pain that has been present for 1 month. Pain is intermittent. Denies any upper respiratory symptoms, sore throat, fever. States that her ear felt full of wax, so she tried to clean ear out with Q-tip but no wax came out. Denies any decreased hearing.    Otalgia  Past Medical History:  Diagnosis Date   Anemia    Anxiety    Chicken pox    Chronic headaches    Constipation    Fibroids    Iron deficiency anemia due to chronic blood loss    from heavy periods   Menorrhagia    Palpitations     Patient Active Problem List   Diagnosis Date Noted   Pneumonia due to COVID-19 virus 06/02/2020   Cough 12/02/2019   Chest congestion 12/02/2019   Migraine 09/19/2019   Class 1 obesity with serious comorbidity and body mass index (BMI) of 33.0 to 33.9 in adult 06/17/2018   Menorrhagia    Iron deficiency anemia due to chronic blood loss    Allergic rhinitis 02/05/2014    History reviewed. No pertinent surgical history.  OB History     Gravida  3   Para      Term      Preterm      AB      Living  1      SAB      IAB      Ectopic      Multiple      Live Births               Home Medications    Prior to Admission medications   Medication Sig Start Date End Date Taking? Authorizing Provider  albuterol (VENTOLIN HFA) 108 (90 Base) MCG/ACT inhaler Inhale 2 puffs into the lungs every 6 (six) hours as needed for wheezing or shortness of breath. 12/06/19  Yes Koberlein, Junell C, MD  amoxicillin-clavulanate (AUGMENTIN) 875-125 MG tablet Take 1 tablet by mouth every 12 (twelve) hours. 12/27/20  Yes Odis Luster, FNP  AVIANE 0.1-20 MG-MCG tablet  09/19/16  Yes [provider]  b complex vitamins tablet Take 1  tablet by mouth daily.   Yes [provider]  Biotin 10000 MCG TABS Take 1 tablet by mouth daily.   Yes [provider]  buPROPion (WELLBUTRIN SR) 150 MG 12 hr tablet Take 1 tablet (150 mg total) by mouth 2 (two) times daily. 10/27/20  Yes Abby Potash, PA-C  diphenhydrAMINE HCl (BENADRYL PO) Take by mouth as needed.   Yes [provider]  DYMISTA 137-50 MCG/ACT SUSP 1 actuation each nostril twice daily 03/23/20  Yes Koberlein, Junell C, MD  Insulin Pen Needle (BD PEN NEEDLE NANO 2ND GEN) 32G X 4 MM MISC 1 Package by Does not apply route daily. 11/09/20  Yes Abby Potash, PA-C  levocetirizine (XYZAL) 5 MG tablet Take 1 tablet (5 mg total) by mouth every evening. 11/09/20  Yes Koberlein, Steele Berg, MD  Liraglutide -Weight Management (SAXENDA) 18 MG/3ML SOPN Inject 3 mg into the skin daily. 11/22/20  Yes Abby Potash, PA-C  montelukast (SINGULAIR) 10 MG tablet Take 1 tablet (10 mg total) by mouth at bedtime. 11/09/20  Yes Koberlein, Steele Berg, MD  Multiple Vitamin (MULTIVITAMIN WITH MINERALS) TABS tablet Take 1 tablet by mouth daily.   Yes [provider]  Omega-3 Fatty Acids (FISH OIL) 1000 MG CAPS Take 1 capsule by mouth daily.   Yes [provider]  polyethylene glycol (MIRALAX / GLYCOLAX) 17 g packet Take 17 g by mouth daily.   Yes [provider]  VITAMIN D PO Take 10,000 Units by mouth once a week.   Yes [provider]  cetirizine (ZYRTEC) 10 MG tablet Take 1 tablet (10 mg total) by mouth daily. 09/05/20 10/05/20  Kara Dies, NP  fluticasone (FLONASE) 50 MCG/ACT nasal spray Place 2 sprays into both nostrils daily. 09/05/20 10/05/20  Kara Dies, NP    Family History Family History  Problem Relation Age of Onset   Hypertension Mother    Cancer Mother        skin cancer ?type but required radiation   Healthy Maternal Grandmother    Diabetes Maternal Grandfather    Glaucoma Maternal Grandfather    Hypertension  Maternal Grandfather    Hyperlipidemia Maternal Grandfather    Gout Maternal Grandfather    Hypertension Maternal Uncle    Colon polyps Neg Hx    Colon cancer Neg Hx    Esophageal cancer Neg Hx     Social History Social History   Tobacco Use   Smoking status: Never   Smokeless tobacco: Never  Vaping Use   Vaping Use: Never used  Substance Use Topics   Alcohol use: Yes    Alcohol/week: 0.0 standard drinks    Comment: social   Drug use: No     Allergies   Patient has no known allergies.   Review of Systems Review of Systems  HENT:  Positive for ear pain.    Per HPI Physical Exam Triage Vital Signs ED Triage Vitals  Enc Vitals Group     BP 12/27/20 1905 (!) 141/85     Pulse Rate 12/27/20 1905 74     Resp 12/27/20 1905 18     Temp 12/27/20 1905 98 F (36.7 C)     Temp Source 12/27/20 1905 Oral     SpO2 12/27/20 1905 99 %     Weight 12/27/20 1906 185 lb (83.9 kg)     Height 12/27/20 1906 '5\' 6"'$  (1.676 m)     Head Circumference --      Peak Flow --      Pain Score 12/27/20 1906 4     Pain Loc --      Pain Edu? --      Excl. in [place]? --    No data found.  Updated Vital Signs BP (!) 141/85 (BP Location: Left Arm)   Pulse 74   Temp 98 F (36.7 C) (Oral)   Resp 18   Ht '5\' 6"'$  (1.676 m)   Wt 185 lb (83.9 kg)   SpO2 99%   BMI 29.86 kg/m   Visual Acuity Right Eye Distance:   Left Eye Distance:   Bilateral Distance:    Right Eye Near:   Left Eye Near:    Bilateral Near:     Physical Exam Constitutional:      Appearance: Normal appearance.  HENT:     Head: Normocephalic and atraumatic.     Right Ear: Ear canal normal. A middle ear effusion is present. Tympanic membrane is not erythematous or bulging.     Left Ear: Ear canal normal. Tympanic membrane is  erythematous. Tympanic membrane is not bulging.     Nose: Nose normal.     Mouth/Throat:     Mouth: Mucous membranes are moist.     Pharynx: Oropharynx is clear. No posterior oropharyngeal erythema.   Eyes:     Extraocular Movements: Extraocular movements intact.     Conjunctiva/sclera: Conjunctivae normal.  Pulmonary:     Effort: Pulmonary effort is normal.  Neurological:     General: No focal deficit present.     Mental Status: She is alert and oriented to person, place, and time. Mental status is at baseline.  Psychiatric:        Mood and Affect: Mood normal.        Behavior: Behavior normal.        Thought Content: Thought content normal.        Judgment: Judgment normal.     UC Treatments / Results  Labs (all labs ordered are listed, but only abnormal results are displayed) Labs Reviewed - No data to display  EKG   Radiology No results found.  Procedures Procedures (including critical care time)  Medications Ordered in UC Medications - No data to display  Initial Impression / Assessment and Plan / UC Course  I have reviewed the triage vital signs and the nursing notes.  Pertinent labs & imaging results that were available during my care of the patient were reviewed by me and considered in my medical decision making (see chart for details).     Will treat left ear infection with augmentin antibiotic due to recent treatment with antibiotics for UTI. No appearance of otitis externa on exam. Discussed strict return precautions. Patient verbalized understanding and is agreeable with plan.  Final Clinical Impressions(s) / UC Diagnoses   Final diagnoses:  Other non-recurrent acute nonsuppurative otitis media of left ear  Left ear pain     Discharge Instructions      You are being treated for right ear infection with augmentin antibiotic.      ED Prescriptions     Medication Sig Dispense Auth. Provider   amoxicillin-clavulanate (AUGMENTIN) 875-125 MG tablet Take 1 tablet by mouth every 12 (twelve) hours. 14 tablet Odis Luster, FNP      PDMP not reviewed this encounter.   Odis Luster, FNP 12/27/20 [place]

## 2020-12-27 NOTE — Discharge Instructions (Addendum)
You are being treated for right ear infection with augmentin antibiotic.

## 2020-12-27 NOTE — ED Triage Notes (Signed)
Patient c/o left ear pain that comes and goes over a month now.  When she cleans her ears with a Q-tip, nothing comes out.  No other sx's.

## 2020-12-29 ENCOUNTER — Ambulatory Visit: Payer: BC Managed Care – PPO | Admitting: Family

## 2020-12-29 ENCOUNTER — Other Ambulatory Visit: Payer: BC Managed Care – PPO

## 2020-12-30 ENCOUNTER — Encounter: Payer: Self-pay | Admitting: Family Medicine

## 2020-12-31 ENCOUNTER — Telehealth: Payer: Self-pay | Admitting: Emergency Medicine

## 2020-12-31 MED ORDER — FLUCONAZOLE 150 MG PO TABS: 150.0000 mg | ORAL_TABLET | Freq: Once | ORAL | 0 refills | Status: AC

## 2020-12-31 NOTE — Telephone Encounter (Signed)
Verbal order for RN to send an rx for Fluconazole '150mg'$  po once to pharmacy. Rx was sent.

## 2020-12-31 NOTE — Telephone Encounter (Signed)
Patient states that she's starting to have some vaginal discharge and itching as she completes the Augmentin.  Instructed patient that the Diflucan would be sent to pharmacy.  If no better after taking the Diflucan, will need to follow up with PCP.  Patient voices understanding.

## 2020-12-31 NOTE — Telephone Encounter (Signed)
Patient called stating she was seen on 12/27/2020 and diagnosed with an ear infection.  Patient was given Augmentin and states she was supposed to receive Diflucan as well.  Diflucan was not sent to pharmacy.  Please advise.  [phone].

## 2021-01-07 ENCOUNTER — Ambulatory Visit: Payer: BC Managed Care – PPO | Admitting: Family Medicine

## 2021-02-08 ENCOUNTER — Encounter: Payer: Self-pay | Admitting: Family

## 2021-02-08 ENCOUNTER — Inpatient Hospital Stay: Payer: BC Managed Care – PPO | Attending: Hematology & Oncology

## 2021-02-08 ENCOUNTER — Inpatient Hospital Stay (HOSPITAL_BASED_OUTPATIENT_CLINIC_OR_DEPARTMENT_OTHER): Payer: BC Managed Care – PPO | Admitting: Family

## 2021-02-08 ENCOUNTER — Other Ambulatory Visit: Payer: Self-pay

## 2021-02-08 VITALS — BP 84/67 | HR 79 | Temp 98.6°F | Resp 17 | Wt 199.1 lb

## 2021-02-08 DIAGNOSIS — N920 Excessive and frequent menstruation with regular cycle: Secondary | ICD-10-CM | POA: Insufficient documentation

## 2021-02-08 DIAGNOSIS — D500 Iron deficiency anemia secondary to blood loss (chronic): Secondary | ICD-10-CM | POA: Diagnosis not present

## 2021-02-08 DIAGNOSIS — K921 Melena: Secondary | ICD-10-CM | POA: Diagnosis not present

## 2021-02-08 LAB — CBC WITH DIFFERENTIAL (CANCER CENTER ONLY)
Abs Immature Granulocytes: 0.03 10*3/uL (ref 0.00–0.07)
Basophils Absolute: 0.1 10*3/uL (ref 0.0–0.1)
Basophils Relative: 1 %
Eosinophils Absolute: 0.4 10*3/uL (ref 0.0–0.5)
Eosinophils Relative: 4 %
HCT: 42 % (ref 36.0–46.0)
Hemoglobin: 13.9 g/dL (ref 12.0–15.0)
Immature Granulocytes: 0 %
Lymphocytes Relative: 19 %
Lymphs Abs: 1.9 10*3/uL (ref 0.7–4.0)
MCH: 30.2 pg (ref 26.0–34.0)
MCHC: 33.1 g/dL (ref 30.0–36.0)
MCV: 91.1 fL (ref 80.0–100.0)
Monocytes Absolute: 0.5 10*3/uL (ref 0.1–1.0)
Monocytes Relative: 5 %
Neutro Abs: 7.2 10*3/uL (ref 1.7–7.7)
Neutrophils Relative %: 71 %
Platelet Count: 239 10*3/uL (ref 150–400)
RBC: 4.61 MIL/uL (ref 3.87–5.11)
RDW: 12.9 % (ref 11.5–15.5)
WBC Count: 10 10*3/uL (ref 4.0–10.5)
nRBC: 0 % (ref 0.0–0.2)

## 2021-02-08 LAB — RETICULOCYTES
Immature Retic Fract: 10.9 % (ref 2.3–15.9)
RBC.: 4.58 MIL/uL (ref 3.87–5.11)
Retic Count, Absolute: 92.5 10*3/uL (ref 19.0–186.0)
Retic Ct Pct: 2 % (ref 0.4–3.1)

## 2021-02-08 NOTE — Progress Notes (Signed)
Hematology and Oncology Follow Up Visit  Tamara Ball [id number] [birth year] 40 y.o. 02/08/2021   Principle Diagnosis:  Iron deficiency anemia secondary to menorrhagia    Current Therapy:        IV iron as indicated    Interim History:  Tamara Ball is here today for follow-up. She is symptomatic with fatigue.  She has also had mild chest discomfort on occasion that resolves with taking an aspirin.  She has occasional bright red blood in stool with straining and hemorrhoids.  Her cycle is regular but flow varies between very light and very heavy at times.  No other blood loss noted. No bruising or petechiae.  No fever, chills, n/v, cough, rash, dizziness, palpitations, abdominal pain or changes in bowel or bladder habits.  No swelling or tenderness in her extremities.  No falls or syncope.  She states that she has cut out carbs and is eating lots of vegetables. She admits that she needs to better hydrate throughout the day. Her weight is 199 lbs.    ECOG Performance Status: 1 - Symptomatic but completely ambulatory  Medications:  Allergies as of 02/08/2021   No Known Allergies      Medication List        Accurate as of February 08, 2021  2:04 PM. If you have any questions, ask your nurse or doctor.          albuterol 108 (90 Base) MCG/ACT inhaler Commonly known as: VENTOLIN HFA Inhale 2 puffs into the lungs every 6 (six) hours as needed for wheezing or shortness of breath.   amoxicillin-clavulanate 875-125 MG tablet Commonly known as: AUGMENTIN Take 1 tablet by mouth every 12 (twelve) hours.   Aviane 0.1-20 MG-MCG tablet Generic drug: levonorgestrel-ethinyl estradiol   b complex vitamins tablet Take 1 tablet by mouth daily.   BD Pen Needle Nano 2nd Gen 32G X 4 MM Misc Generic drug: Insulin Pen Needle 1 Package by Does not apply route daily.   BENADRYL PO Take by mouth as needed.   Biotin 10000 MCG Tabs Take 1 tablet by mouth daily.   buPROPion 150 MG 12 hr  tablet Commonly known as: WELLBUTRIN SR Take 1 tablet (150 mg total) by mouth 2 (two) times daily.   cetirizine 10 MG tablet Commonly known as: ZYRTEC Take 1 tablet (10 mg total) by mouth daily.   Dymista 137-50 MCG/ACT Susp Generic drug: Azelastine-Fluticasone 1 actuation each nostril twice daily   Fish Oil 1000 MG Caps Take 1 capsule by mouth daily.   fluticasone 50 MCG/ACT nasal spray Commonly known as: FLONASE Place 2 sprays into both nostrils daily.   levocetirizine 5 MG tablet Commonly known as: XYZAL Take 1 tablet (5 mg total) by mouth every evening.   montelukast 10 MG tablet Commonly known as: SINGULAIR Take 1 tablet (10 mg total) by mouth at bedtime.   multivitamin with minerals Tabs tablet Take 1 tablet by mouth daily.   polyethylene glycol 17 g packet Commonly known as: MIRALAX / GLYCOLAX Take 17 g by mouth daily.   Saxenda 18 MG/3ML Sopn Generic drug: Liraglutide -Weight Management Inject 3 mg into the skin daily.   VITAMIN D PO Take 10,000 Units by mouth once a week.        Allergies: No Known Allergies  Past Medical History, Surgical history, Social history, and Family History were reviewed and updated.  Review of Systems: All other 10 point review of systems is negative.   Physical Exam:  weight is  199 lb 1.9 oz (90.3 kg). Her oral temperature is 98.6 F (37 C). Her blood pressure is 84/67 (abnormal) and her pulse is 79. Her respiration is 17 and oxygen saturation is 100%.   Wt Readings from Last 3 Encounters:  02/08/21 199 lb 1.9 oz (90.3 kg)  12/27/20 185 lb (83.9 kg)  11/22/20 188 lb (85.3 kg)    Ocular: Sclerae unicteric, pupils equal, round and reactive to light Ear-nose-throat: Oropharynx clear, dentition fair Lymphatic: No cervical or supraclavicular adenopathy Lungs no rales or rhonchi, good excursion bilaterally Heart regular rate and rhythm, no murmur appreciated Abd soft, nontender, positive bowel sounds MSK no focal  spinal tenderness, no joint edema Neuro: non-focal, well-oriented, appropriate affect Breasts: Deferred   Lab Results  Component Value Date   WBC 10.0 02/08/2021   HGB 13.9 02/08/2021   HCT 42.0 02/08/2021   MCV 91.1 02/08/2021   PLT 239 02/08/2021   Lab Results  Component Value Date   FERRITIN 24 07/01/2020   IRON 63 07/01/2020   TIBC 470 (H) 07/01/2020   UIBC 407 (H) 07/01/2020   IRONPCTSAT 13 (L) 07/01/2020   Lab Results  Component Value Date   RETICCTPCT 2.0 02/08/2021   RBC 4.58 02/08/2021   RETICCTABS 64,400 05/16/2016   No results found for: KPAFRELGTCHN, LAMBDASER, KAPLAMBRATIO No results found for: IGGSERUM, IGA, IGMSERUM No results found for: Odetta Pink, SPEI   Chemistry      Component Value Date/Time   NA 137 06/20/2020 0405   NA 139 01/27/2020 0844   K 3.8 06/20/2020 0405   K 4.2 08/11/2016 1404   CL 105 06/20/2020 0405   CL 103 08/11/2016 1404   CO2 23 06/20/2020 0405   CO2 26 08/11/2016 1404   BUN 19 06/20/2020 0405   BUN 11 01/27/2020 0844   CREATININE 0.89 06/20/2020 0405   CREATININE 0.87 08/11/2016 1404      Component Value Date/Time   CALCIUM 8.9 06/20/2020 0405   CALCIUM 9.5 08/11/2016 1404   ALKPHOS 49 06/20/2020 0405   ALKPHOS 58 08/11/2016 1404   AST 13 (L) 06/20/2020 0405   AST 9 08/11/2016 1404   ALT 9 06/20/2020 0405   ALT 8 08/11/2016 1404   BILITOT 0.3 06/20/2020 0405   BILITOT 0.5 01/27/2020 0844       Impression and Plan: Tamara Ball is a very pleasant 40 yo African American female with iron deficiency secondary to heavy cycles.  Iron studies are pending. We will replace if needed.  Follow-up in 6 months.  She can contact our office with any questions or concerns.   Lottie Dawson, NP 10/4/20222:04 PM

## 2021-02-09 LAB — IRON AND TIBC
Iron: 131 ug/dL (ref 41–142)
Saturation Ratios: 27 % (ref 21–57)
TIBC: 485 ug/dL — ABNORMAL HIGH (ref 236–444)
UIBC: 355 ug/dL (ref 120–384)

## 2021-02-09 LAB — FERRITIN: Ferritin: 26 ng/mL (ref 11–307)

## 2021-02-10 ENCOUNTER — Telehealth: Payer: Self-pay | Admitting: *Deleted

## 2021-02-10 NOTE — Telephone Encounter (Signed)
Per 02/08/21 los called and lvm of upcoming appointments - requested call back to reschedule

## 2021-02-18 ENCOUNTER — Ambulatory Visit: Payer: BC Managed Care – PPO | Admitting: Family Medicine

## 2021-02-18 ENCOUNTER — Encounter: Payer: Self-pay | Admitting: Family Medicine

## 2021-02-18 ENCOUNTER — Other Ambulatory Visit: Payer: Self-pay

## 2021-02-18 VITALS — BP 118/68 | HR 70 | Temp 97.5°F | Ht 66.0 in | Wt 201.6 lb

## 2021-02-18 DIAGNOSIS — R002 Palpitations: Secondary | ICD-10-CM

## 2021-02-18 DIAGNOSIS — H6983 Other specified disorders of Eustachian tube, bilateral: Secondary | ICD-10-CM

## 2021-02-18 DIAGNOSIS — F419 Anxiety disorder, unspecified: Secondary | ICD-10-CM | POA: Diagnosis not present

## 2021-02-18 DIAGNOSIS — Z23 Encounter for immunization: Secondary | ICD-10-CM

## 2021-02-18 DIAGNOSIS — B351 Tinea unguium: Secondary | ICD-10-CM

## 2021-02-18 DIAGNOSIS — R9389 Abnormal findings on diagnostic imaging of other specified body structures: Secondary | ICD-10-CM

## 2021-02-18 DIAGNOSIS — J309 Allergic rhinitis, unspecified: Secondary | ICD-10-CM

## 2021-02-18 DIAGNOSIS — G43819 Other migraine, intractable, without status migrainosus: Secondary | ICD-10-CM | POA: Diagnosis not present

## 2021-02-18 DIAGNOSIS — E559 Vitamin D deficiency, unspecified: Secondary | ICD-10-CM

## 2021-02-18 DIAGNOSIS — H6993 Unspecified Eustachian tube disorder, bilateral: Secondary | ICD-10-CM

## 2021-02-18 MED ORDER — LORAZEPAM 0.5 MG PO TABS: 0.5000 mg | ORAL_TABLET | Freq: Two times a day (BID) | ORAL | 1 refills | Status: DC | PRN

## 2021-02-18 MED ORDER — FLUTICASONE PROPIONATE 50 MCG/ACT NA SUSP: 2.0000 | Freq: Every day | NASAL | 5 refills | Status: DC

## 2021-02-18 MED ORDER — MONTELUKAST SODIUM 10 MG PO TABS: 10.0000 mg | ORAL_TABLET | Freq: Every day | ORAL | 1 refills | Status: DC

## 2021-02-18 MED ORDER — LEVOCETIRIZINE DIHYDROCHLORIDE 5 MG PO TABS: 5.0000 mg | ORAL_TABLET | Freq: Every evening | ORAL | 1 refills | Status: DC

## 2021-02-18 NOTE — Progress Notes (Signed)
Mayo Ao DOB: 1980-09-13 Encounter date: 02/18/2021  This is a 40 y.o. female who presents with Chief Complaint  Patient presents with   Medication Refill   Nail Problem    Patient complains of toenail fungus bilateral great toes x2 months   Ear Pain    Patient states she was diagnosed with a left ear infection 2 months ago at an urgent care, requests repeat check of bilateral ears   Insomnia    Patient requests medication to help with sleep due to upcoming 15 hour flight she has planned    History of present illness: Last visit with me was over a year ago - 12/2019 for hives/allergy reaction.   Toenail fungus - using otc antifungal cream twice daily. Does feel like this is helping.   Got ear infection; had to go to urgent care. This is unusual for her. She had noted a little increased wax for awhile prior to getting the pain. Was infected enough they felt she needed abx treatment. Has flown since then and was ok; but just make sure this is controlled.   If allergies are flared, then flight can be more difficult. Has 20 hour flight - even now ears are popping all the time, just in general. Hasn't taken singulair in 2 weeks. Has been more nasal-y. She has taken the xyzal every day. Got sick in march when traveling to [place]. Dymista wasn't working for her. Did e visit and was prescribed flonase and given zyrtec. If really bad then she has taken singulair, xyzal, zyrtec in same day. If she doesn't take xyzal then she starts itching all over. Can't go more than 48 hours without this.   Already doesn't sleep well. She is tired. Just not sleeping well. Falls asleep ok, but by time she feels she is resting then she has to get up. Also has some anxiety with flight. Has hard time with confined spaces.if she goes to sleep and wakes up, then she is very nasal-Y. Benadryl, tylenol pm doesn't help - they keep her up. Was given zolpidem by sleep med - didn't take this due to not wanting  controlled/dependable substance. Never took lunesta.   When cycle is on, the worse the headache. Cycle is 5-7 days and will start with headache week before cycle and lasts through cycle. Hasn't been as bad outside of that. Sometimes tension headache excedrin works. Headaches worse when cycle is heavier. Was seeing headache specialist. No treatments worked for her.   No Known Allergies Current Meds  Medication Sig   AVIANE 0.1-20 MG-MCG tablet    b complex vitamins tablet Take 1 tablet by mouth daily.   Biotin 10000 MCG TABS Take 1 tablet by mouth daily.   diphenhydrAMINE HCl (BENADRYL PO) Take by mouth as needed.   DYMISTA 137-50 MCG/ACT SUSP 1 actuation each nostril twice daily (Patient taking differently: as needed. 1 actuation each nostril twice daily)   Insulin Pen Needle (BD PEN NEEDLE NANO 2ND GEN) 32G X 4 MM MISC 1 Package by Does not apply route daily.   levocetirizine (XYZAL) 5 MG tablet Take 1 tablet (5 mg total) by mouth every evening.   Liraglutide -Weight Management (SAXENDA) 18 MG/3ML SOPN Inject 3 mg into the skin daily.   montelukast (SINGULAIR) 10 MG tablet Take 1 tablet (10 mg total) by mouth at bedtime.   Multiple Vitamin (MULTIVITAMIN WITH MINERALS) TABS tablet Take 1 tablet by mouth daily.   Omega-3 Fatty Acids (FISH OIL) 1000 MG CAPS  Take 1 capsule by mouth daily.   polyethylene glycol (MIRALAX / GLYCOLAX) 17 g packet Take 17 g by mouth daily as needed.   VITAMIN D PO Take 10,000 Units by mouth once a week.    Review of Systems  Constitutional:  Negative for chills, fatigue and fever.  HENT:  Positive for congestion and sinus pressure. Negative for sinus pain. Ear pain: resolved, see hpi.  Respiratory:  Negative for cough, chest tightness, shortness of breath and wheezing.   Cardiovascular:  Negative for chest pain, palpitations and leg swelling.  Skin:        Great toenails - yellow areas, seems to be growing out/improving  Neurological:  Positive for headaches.    Objective:  BP 118/68 (BP Location: Left Arm, Patient Position: Sitting, Cuff Size: Large)   Pulse 70   Temp (!) 97.5 F (36.4 C) (Oral)   Ht 5\' 6"  (1.676 m)   Wt 201 lb 9.6 oz (91.4 kg)   SpO2 99%   BMI 32.54 kg/m   Weight: 201 lb 9.6 oz (91.4 kg)   BP Readings from Last 3 Encounters:  02/18/21 118/68  02/08/21 (!) 84/67  12/27/20 (!) 141/85   Wt Readings from Last 3 Encounters:  02/18/21 201 lb 9.6 oz (91.4 kg)  02/08/21 199 lb 1.9 oz (90.3 kg)  12/27/20 185 lb (83.9 kg)    Physical Exam Constitutional:      General: She is not in acute distress.    Appearance: She is well-developed.  HENT:     Right Ear: Tympanic membrane, ear canal and external ear normal.     Left Ear: Tympanic membrane, ear canal and external ear normal.     Nose:     Right Turbinates: Enlarged, swollen and pale.     Left Turbinates: Enlarged, swollen and pale.  Cardiovascular:     Rate and Rhythm: Normal rate and regular rhythm.     Heart sounds: Normal heart sounds. No murmur heard.   No friction rub.  Pulmonary:     Effort: Pulmonary effort is normal. No respiratory distress.     Breath sounds: Normal breath sounds. No wheezing or rales.  Musculoskeletal:     Right lower leg: No edema.     Left lower leg: No edema.  Skin:    Comments: Toenail exam limited due to patient having to lack over great toenails.  She does have a picture prior to getting nails painted.  It does appear that there has been some grow out of area of concern (yellow-ish, more opaque areas on bilateral great toes).  Neurological:     Mental Status: She is alert and oriented to person, place, and time.  Psychiatric:        Behavior: Behavior normal.    Assessment/Plan  1. Other migraine without status migrainosus, intractable She is being seen at the headache clinic, but not by neurology.  With recent abnormality noted on MRI (question of Chiari malformation), and persistence of migraines, I do feel that seeing a  neurologist is worthwhile.  She has tried multiple medications that have not helped at all for headaches, I am going to request records from the headache specialist that I can forward to neurology if needed. - Ambulatory referral to Neurology; Future  2. Anxiety See HPI.  She does have some anxiety about flights and closed in spaces.  We are going to try some Ativan for her due to the upcoming long flight.  Hopefully this will help with anxiety  as well as some sleep. Discussed new medication(s) today with patient. Discussed potential side effects and patient verbalized understanding.   3. Abnormal MRI See above.  4. Palpitations Patient is always experienced palpitations.  She has had noted PACs on her EKG. - Comprehensive metabolic panel; Future - TSH; Future - Vitamin B12; Future  5. Vitamin D deficiency - VITAMIN D 25 Hydroxy (Vit-D Deficiency, Fractures); Future  6. Allergic rhinitis, unspecified seasonality, unspecified trigger Encouraged her to use Flonase daily.  This will help with eustachian tube dysfunction as well as continued congestion.  Continue with antihistamine.  Did suggest using Afrin nasal spray just prior to takeoff to help with eustachian tube dysfunction on flight. - fluticasone (FLONASE) 50 MCG/ACT nasal spray; Place 2 sprays into both nostrils daily.  Dispense: 16 g; Refill: 5  7. Need for immunization against influenza - Flu Vaccine QUAD 6+ mos PF IM (Fluarix Quad PF)  8. Onychomycosis This is resolving on its own.  We discussed that oral medications for nail fungus can be damaging to the liver, so she would prefer to hold off on this.  If she is not continuing to notice improvement in nails, I suggested taking off her polish and doing a topical treatment through the winter.  9. Dysfunction of both eustachian tubes See above.  Continue with Flonase, antihistamine.  Afrin prior to flight.    Return in about 6 months (around 08/19/2021) for Chronic condition  visit.  50 minutes spent in chart review, time with patient, exam, discussion of treatment of current conditions.   Micheline Rough, MD

## 2021-02-18 NOTE — Patient Instructions (Signed)
Get afrin nasal spray to use 10 minutes+ before your flight.

## 2021-02-24 ENCOUNTER — Other Ambulatory Visit: Payer: Self-pay

## 2021-02-24 ENCOUNTER — Other Ambulatory Visit (INDEPENDENT_AMBULATORY_CARE_PROVIDER_SITE_OTHER): Payer: BC Managed Care – PPO

## 2021-02-24 ENCOUNTER — Encounter: Payer: Self-pay | Admitting: Neurology

## 2021-02-24 DIAGNOSIS — R002 Palpitations: Secondary | ICD-10-CM

## 2021-02-24 DIAGNOSIS — E559 Vitamin D deficiency, unspecified: Secondary | ICD-10-CM | POA: Diagnosis not present

## 2021-02-24 LAB — COMPREHENSIVE METABOLIC PANEL
ALT: 10 U/L (ref 0–35)
AST: 11 U/L (ref 0–37)
Albumin: 4 g/dL (ref 3.5–5.2)
Alkaline Phosphatase: 53 U/L (ref 39–117)
BUN: 15 mg/dL (ref 6–23)
CO2: 27 mEq/L (ref 19–32)
Calcium: 9.2 mg/dL (ref 8.4–10.5)
Chloride: 105 mEq/L (ref 96–112)
Creatinine, Ser: 0.83 mg/dL (ref 0.40–1.20)
GFR: 86.87 mL/min (ref >60.00)
Glucose, Bld: 90 mg/dL (ref 70–99)
Potassium: 4 mEq/L (ref 3.5–5.1)
Sodium: 138 mEq/L (ref 135–145)
Total Bilirubin: 0.4 mg/dL (ref 0.2–1.2)
Total Protein: 7 g/dL (ref 6.0–8.3)

## 2021-02-24 LAB — VITAMIN D 25 HYDROXY (VIT D DEFICIENCY, FRACTURES): VITD: 45.01 ng/mL (ref 30.00–100.00)

## 2021-02-24 LAB — TSH: TSH: 3.12 u[IU]/mL (ref 0.35–5.50)

## 2021-02-24 LAB — VITAMIN B12: Vitamin B-12: 951 pg/mL — ABNORMAL HIGH (ref 211–911)

## 2021-02-24 NOTE — Addendum Note (Signed)
Addended by: Amanda Cockayne on: 02/24/2021 07:20 AM   Modules accepted: Orders

## 2021-03-15 ENCOUNTER — Encounter: Payer: Self-pay | Admitting: Family Medicine

## 2021-03-19 MED ORDER — AJOVY 225 MG/1.5ML ~~LOC~~ SOAJ: 225.0000 mg | SUBCUTANEOUS | 3 refills | Status: DC

## 2021-03-23 ENCOUNTER — Encounter: Payer: Self-pay | Admitting: Neurology

## 2021-03-25 ENCOUNTER — Ambulatory Visit: Payer: BC Managed Care – PPO | Admitting: Family Medicine

## 2021-03-29 ENCOUNTER — Ambulatory Visit: Payer: BC Managed Care – PPO | Admitting: Family Medicine

## 2021-03-29 ENCOUNTER — Encounter: Payer: Self-pay | Admitting: Family Medicine

## 2021-03-29 VITALS — BP 128/80 | HR 86 | Resp 16 | Ht 66.0 in | Wt 206.0 lb

## 2021-03-29 DIAGNOSIS — H6980 Other specified disorders of Eustachian tube, unspecified ear: Secondary | ICD-10-CM | POA: Diagnosis not present

## 2021-03-29 DIAGNOSIS — H9201 Otalgia, right ear: Secondary | ICD-10-CM

## 2021-03-29 DIAGNOSIS — J309 Allergic rhinitis, unspecified: Secondary | ICD-10-CM | POA: Diagnosis not present

## 2021-03-29 NOTE — Patient Instructions (Signed)
A few things to remember from today's visit:  Dysfunction of Eustachian tube, unspecified laterality  If you need refills please call your pharmacy. Do not use My Chart to request refills or for acute issues that need immediate attention.   Flying can aggravate problem. Taking a decongestant when in the airplane, chewing gum, and popping ears may help. Monitor for changes in hearing. Hearing today was normal.  Please be sure medication list is accurate. If a new problem present, please set up appointment sooner than planned today.  Eustachian Tube Dysfunction Eustachian tube dysfunction refers to a condition in which a blockage develops in the narrow passage that connects the middle ear to the back of the nose (eustachian tube). The eustachian tube regulates air pressure in the middle ear by letting air move between the ear and nose. It also helps to drain fluid from the middle ear space. Eustachian tube dysfunction can affect one or both ears. When the eustachian tube does not function properly, air pressure, fluid, or both can build up in the middle ear. What are the causes? This condition occurs when the eustachian tube becomes blocked or cannot open normally. Common causes of this condition include: Ear infections. Colds and other infections that affect the nose, mouth, and throat (upper respiratory tract). Allergies. Irritation from cigarette smoke. Irritation from stomach acid coming up into the esophagus (gastroesophageal reflux). The esophagus is the part of the body that moves food from the mouth to the stomach. Sudden changes in air pressure, such as from descending in an airplane or scuba diving. Abnormal growths in the nose or throat, such as: Growths that line the nose (nasal polyps). Abnormal growth of cells (tumors). Enlarged tissue at the back of the throat (adenoids). What increases the risk? You are more likely to develop this condition if: You smoke. You are  overweight. You are a child who has: Certain birth defects of the mouth, such as cleft palate. Large tonsils or adenoids. What are the signs or symptoms? Common symptoms of this condition include: A feeling of fullness in the ear. Ear pain. Clicking or popping noises in the ear. Ringing in the ear (tinnitus). Hearing loss. Loss of balance. Dizziness. Symptoms may get worse when the air pressure around you changes, such as when you travel to an area of high elevation, fly on an airplane, or go scuba diving. How is this diagnosed? This condition may be diagnosed based on: Your symptoms. A physical exam of your ears, nose, and throat. Tests, such as those that measure: The movement of your eardrum. Your hearing (audiometry). How is this treated? Treatment depends on the cause and severity of your condition. In mild cases, you may relieve your symptoms by moving air into your ears. This is called "popping the ears." In more severe cases, or if you have symptoms of fluid in your ears, treatment may include: Medicines to relieve congestion (decongestants). Medicines that treat allergies (antihistamines). Nasal sprays or ear drops that contain medicines that reduce swelling (steroids). A procedure to drain the fluid in your eardrum. In this procedure, a small tube may be placed in the eardrum to: Drain the fluid. Restore the air in the middle ear space. A procedure to insert a balloon device through the nose to inflate the opening of the eustachian tube (balloon dilation). Follow these instructions at home: Lifestyle Do not do any of the following until your health care provider approves: Travel to high altitudes. Fly in airplanes. Work in a Pension scheme manager  or room. Scuba dive. Do not use any products that contain nicotine or tobacco. These products include cigarettes, chewing tobacco, and vaping devices, such as e-cigarettes. If you need help quitting, ask your health care  provider. Keep your ears dry. Wear fitted earplugs during showering and bathing. Dry your ears completely after. General instructions Take over-the-counter and prescription medicines only as told by your health care provider. Use techniques to help pop your ears as recommended by your health care provider. These may include: Chewing gum. Yawning. Frequent, forceful swallowing. Closing your mouth, holding your nose closed, and gently blowing as if you are trying to blow air out of your nose. Keep all follow-up visits. This is important. Contact a health care provider if: Your symptoms do not go away after treatment. Your symptoms come back after treatment. You are unable to pop your ears. You have: A fever. Pain in your ear. Pain in your head or neck. Fluid draining from your ear. Your hearing suddenly changes. You become very dizzy. You lose your balance. Get help right away if: You have a sudden, severe increase in any of your symptoms. Summary Eustachian tube dysfunction refers to a condition in which a blockage develops in the eustachian tube. It can be caused by ear infections, allergies, inhaled irritants, or abnormal growths in the nose or throat. Symptoms may include ear pain or fullness, hearing loss, or ringing in the ears. Mild cases are treated with techniques to unblock the ears, such as yawning or chewing gum. More severe cases are treated with medicines or procedures. This information is not intended to replace advice given to you by your health care provider. Make sure you discuss any questions you have with your health care provider. Document Revised: 07/05/2020 Document Reviewed: 07/05/2020 Elsevier Patient Education  2022 [employer].

## 2021-03-29 NOTE — Progress Notes (Signed)
ACUTE VISIT Chief Complaint  Patient presents with   Cerumen Impaction    Left ear   HPI: Tamara Ball is a 40 y.o. female with history of migraine, allergies, and anxiety here today complaining of ear discomfort. "Little" right pain and some discomfort in left ear, the latter she attributes to "excess wax."  Otalgia  This is a new problem. The problem occurs every few hours. The problem has been gradually improving. There has been no fever. The pain is at a severity of 1/10. Associated symptoms include rhinorrhea. Pertinent negatives include no abdominal pain, coughing, diarrhea, ear discharge, headaches, hearing loss, neck pain, rash or vomiting.  She has noted some cerumen outside of ear, which is unusual for her. Hx of OM a few years ago that required long course of abx treatment. She has not tried OTC medication. No recent URI. She just came back from [place] and ear problems started during her flight back to [facility]  Allergy rhinitis: Nasal congestion and rhinorrhea intermittently. She has not noted fever, chills, or sore throat. She uses Dymista 137-50 mcg daily as needed and Singulair 10 mg daily.  Review of Systems  Constitutional:  Negative for activity change and appetite change.  HENT:  Positive for ear pain, postnasal drip and rhinorrhea. Negative for ear discharge, hearing loss, mouth sores and nosebleeds.   Respiratory:  Negative for cough, shortness of breath and wheezing.   Gastrointestinal:  Negative for abdominal pain, diarrhea and vomiting.  Musculoskeletal:  Negative for neck pain.  Skin:  Negative for pallor and rash.  Allergic/Immunologic: Positive for environmental allergies.  Neurological:  Negative for headaches.  Hematological:  Negative for adenopathy. Does not bruise/bleed easily.  Rest see pertinent positives and negatives per HPI.  Current Outpatient Medications on File Prior to Visit  Medication Sig Dispense Refill   AVIANE 0.1-20 MG-MCG  tablet      b complex vitamins tablet Take 1 tablet by mouth daily.     Biotin 10000 MCG TABS Take 1 tablet by mouth daily.     diphenhydrAMINE HCl (BENADRYL PO) Take by mouth as needed.     DYMISTA 137-50 MCG/ACT SUSP 1 actuation each nostril twice daily (Patient taking differently: as needed. 1 actuation each nostril twice daily) 23 g 3   Fremanezumab-vfrm (AJOVY) 225 MG/1.5ML SOAJ Inject 225 mg into the skin every 30 (thirty) days. 4.5 mL 3   Insulin Pen Needle (BD PEN NEEDLE NANO 2ND GEN) 32G X 4 MM MISC 1 Package by Does not apply route daily. 100 each 0   levocetirizine (XYZAL) 5 MG tablet Take 1 tablet (5 mg total) by mouth every evening. 90 tablet 1   Liraglutide -Weight Management (SAXENDA) 18 MG/3ML SOPN Inject 3 mg into the skin daily. 15 mL 0   LORazepam (ATIVAN) 0.5 MG tablet Take 1 tablet (0.5 mg total) by mouth 2 (two) times daily as needed for anxiety. 30 tablet 1   montelukast (SINGULAIR) 10 MG tablet Take 1 tablet (10 mg total) by mouth at bedtime. 90 tablet 1   Multiple Vitamin (MULTIVITAMIN WITH MINERALS) TABS tablet Take 1 tablet by mouth daily.     Omega-3 Fatty Acids (FISH OIL) 1000 MG CAPS Take 1 capsule by mouth daily.     polyethylene glycol (MIRALAX / GLYCOLAX) 17 g packet Take 17 g by mouth daily as needed.     VITAMIN D PO Take 10,000 Units by mouth once a week.     cetirizine (ZYRTEC) 10 MG  tablet Take 1 tablet (10 mg total) by mouth daily. 30 tablet 0   fluticasone (FLONASE) 50 MCG/ACT nasal spray Place 2 sprays into both nostrils daily. 16 g 5   No current facility-administered medications on file prior to visit.   Past Medical History:  Diagnosis Date   Anemia    Anxiety    Chicken pox    Chronic headaches    Constipation    Fibroids    Iron deficiency anemia due to chronic blood loss    from heavy periods   Menorrhagia    Palpitations    No Known Allergies  Social History   Socioeconomic History   Marital status: Single    Spouse name: Not on  file   Number of children: 1   Years of education: Not on file   Highest education level: Not on file  Occupational History   Occupation: Scientist, physiological  Tobacco Use   Smoking status: Never   Smokeless tobacco: Never  Vaping Use   Vaping Use: Never used  Substance and Sexual Activity   Alcohol use: Yes    Alcohol/week: 0.0 standard drinks    Comment: social   Drug use: No   Sexual activity: Not on file  Other Topics Concern   Not on file  Social History Narrative   Work or School: higher education - Museum/gallery curator      Home Situation: lives with 42 yo      Spiritual Beliefs: Christian      Lifestyle: no regular exercise; diet is so so      Social Determinants of Radio broadcast assistant Strain: Not on file  Food Insecurity: Not on file  Transportation Needs: Not on file  Physical Activity: Not on file  Stress: Not on file  Social Connections: Not on file   Vitals:   03/29/21 0733  BP: 128/80  Pulse: 86  Resp: 16   Body mass index is 33.25 kg/m.  Physical Exam Vitals and nursing note reviewed.  Constitutional:      General: She is not in acute distress.    Appearance: She is well-developed. She is not ill-appearing.  HENT:     Head: Normocephalic and atraumatic.     Right Ear: Hearing, tympanic membrane, ear canal and external ear normal.     Left Ear: Hearing, tympanic membrane, ear canal and external ear normal.     Nose: No rhinorrhea.     Right Sinus: No maxillary sinus tenderness or frontal sinus tenderness.     Left Sinus: No maxillary sinus tenderness or frontal sinus tenderness.     Mouth/Throat:     Mouth: Mucous membranes are moist.  Eyes:     Conjunctiva/sclera: Conjunctivae normal.  Cardiovascular:     Rate and Rhythm: Normal rate and regular rhythm.     Heart sounds: No murmur heard. Pulmonary:     Effort: Pulmonary effort is normal. No respiratory distress.     Breath sounds: Normal breath sounds.  Musculoskeletal:     Cervical back:  No muscular tenderness.  Lymphadenopathy:     Head:     Right side of head: No submandibular adenopathy.     Left side of head: No submandibular adenopathy.     Cervical: No cervical adenopathy.  Skin:    General: Skin is warm.     Findings: No erythema or rash.  Neurological:     General: No focal deficit present.     Mental Status: She is alert  and oriented to person, place, and time.     Gait: Gait normal.  Psychiatric:     Comments: Well groomed, good eye contact.   ASSESSMENT AND PLAN:  Tamara Ball was seen today for cerumen impaction.  Diagnoses and all orders for this visit:  Dysfunction of Eustachian tube, unspecified laterality We discussed diagnosis, prognosis, and treatment options. She is planning on flying to [facility] in a few days, so problem may get worse. Recommend taking a decongestant right before flying, chew gum,and auto inflation maneuvers a few times. Instructed about warning signs.  Earache on right Ear examination today is normal. Hx does not suggest a serious process. Continue monitoring for new symptoms.  Allergic rhinitis, unspecified seasonality, unspecified trigger This probably could be contributing to above problems. Continue current management.  Hearing Screening   500Hz  1000Hz  2000Hz  4000Hz   Right ear Pass Pass Pass Pass  Left ear Pass Pass Pass Pass   Return if symptoms worsen or fail to improve.   Angelly Spearing G. [place], MD  [facility]. [place] office.

## 2021-04-03 ENCOUNTER — Encounter: Payer: Self-pay | Admitting: Family Medicine

## 2021-04-15 ENCOUNTER — Ambulatory Visit: Payer: BC Managed Care – PPO | Admitting: Neurology

## 2021-05-18 ENCOUNTER — Encounter (INDEPENDENT_AMBULATORY_CARE_PROVIDER_SITE_OTHER): Payer: Self-pay | Admitting: Physician Assistant

## 2021-05-18 ENCOUNTER — Other Ambulatory Visit: Payer: Self-pay

## 2021-05-18 ENCOUNTER — Ambulatory Visit (INDEPENDENT_AMBULATORY_CARE_PROVIDER_SITE_OTHER): Payer: BC Managed Care – PPO | Admitting: Physician Assistant

## 2021-05-18 VITALS — BP 113/72 | HR 73 | Temp 98.1°F | Ht 66.0 in | Wt 205.0 lb

## 2021-05-18 DIAGNOSIS — E669 Obesity, unspecified: Secondary | ICD-10-CM

## 2021-05-18 DIAGNOSIS — Z6833 Body mass index (BMI) 33.0-33.9, adult: Secondary | ICD-10-CM

## 2021-05-18 DIAGNOSIS — Z9189 Other specified personal risk factors, not elsewhere classified: Secondary | ICD-10-CM

## 2021-05-18 DIAGNOSIS — R7301 Impaired fasting glucose: Secondary | ICD-10-CM | POA: Diagnosis not present

## 2021-05-18 DIAGNOSIS — Z6830 Body mass index (BMI) 30.0-30.9, adult: Secondary | ICD-10-CM

## 2021-05-18 MED ORDER — OZEMPIC (0.25 OR 0.5 MG/DOSE) 2 MG/1.5ML ~~LOC~~ SOPN: 0.2500 mg | PEN_INJECTOR | SUBCUTANEOUS | 0 refills | Status: DC

## 2021-05-18 NOTE — Progress Notes (Signed)
Chief Complaint:   OBESITY Tamara Ball is here to discuss her progress with her obesity treatment plan along with follow-up of her obesity related diagnoses. Tamara Ball is on the Category 3 Plan and states she is following her eating plan approximately 0% of the time. Tamara Ball states she is not currently exercising.  Today's visit was #: 18 Starting weight: 205 lbs Starting date: 06/13/2020 Today's weight: 205 lbs Today's date: 05/18/2021 Total lbs lost to date: 0 Total lbs lost since last in-office visit: 0  Interim History: Pt has not been in our office since 11/22/2020. She started a spiritual fast last week and has stopped eating things she loves: french fries, cookies, chips. She does not feel that Kirke Shaggy was helping and is willing to try something different.   Subjective:   1. Impaired fasting glucose Pt reports polyphagia throughout the day. Saxenda made her nauseous.  2. At risk for diabetes mellitus Tamara Ball is at higher than average risk for developing diabetes due to obesity.   Assessment/Plan:   1. Impaired fasting glucose Start Ozempic 0.25 mg weekly.  Start- Semaglutide,0.25 or 0.5MG /DOS, (OZEMPIC, 0.25 OR 0.5 MG/DOSE,) 2 MG/1.5ML SOPN; Inject 0.25 mg into the skin once a week.  Dispense: 1.5 mL; Refill: 0  2. At risk for diabetes mellitus Tamara Ball was given approximately 15 minutes of diabetes education and counseling today. We discussed intensive lifestyle modifications today with an emphasis on weight loss as well as increasing exercise and decreasing simple carbohydrates in her diet. We also reviewed medication options with an emphasis on risk versus benefit of those discussed.   Repetitive spaced learning was employed today to elicit superior memory formation and behavioral change.  3. Obesity with current BMI 33.1  Tamara Ball is currently in the action stage of change. As such, her goal is to continue with weight loss efforts. She has agreed to keeping a food journal and  adhering to recommended goals of 1500 calories and 95 protein.   Repeat IC and labs at next visit.  Exercise goals: All adults should avoid inactivity. Some physical activity is better than none, and adults who participate in any amount of physical activity gain some health benefits.  Behavioral modification strategies: keeping a strict food journal.  Tamara Ball has agreed to follow-up with our clinic in 2 weeks. She was informed of the importance of frequent follow-up visits to maximize her success with intensive lifestyle modifications for her multiple health conditions.   Objective:   Blood pressure 113/72, pulse 73, temperature 98.1 F (36.7 C), temperature source Oral, height 5\' 6"  (1.676 m), weight 205 lb (93 kg), SpO2 98 %. Body mass index is 33.09 kg/m.  General: Cooperative, alert, well developed, in no acute distress. HEENT: Conjunctivae and lids unremarkable. Cardiovascular: Regular rhythm.  Lungs: Normal work of breathing. Neurologic: No focal deficits.   Lab Results  Component Value Date   CREATININE 0.83 02/24/2021   BUN 15 02/24/2021   NA 138 02/24/2021   K 4.0 02/24/2021   CL 105 02/24/2021   CO2 27 02/24/2021   Lab Results  Component Value Date   ALT 10 02/24/2021   AST 11 02/24/2021   ALKPHOS 53 02/24/2021   BILITOT 0.4 02/24/2021   Lab Results  Component Value Date   HGBA1C 5.0 07/13/2020   HGBA1C 5.1 01/27/2020   HGBA1C 4.9 06/17/2019   HGBA1C 5.0 05/27/2018   HGBA1C 5.1 05/22/2017   Lab Results  Component Value Date   INSULIN 7.6 07/13/2020   INSULIN 12.6  01/27/2020   INSULIN 9.1 06/17/2019   INSULIN 8.9 06/13/2018   Lab Results  Component Value Date   TSH 3.12 02/24/2021   Lab Results  Component Value Date   CHOL 140 07/13/2020   HDL 63 07/13/2020   LDLCALC 65 07/13/2020   TRIG 53 07/13/2020   CHOLHDL 2.2 07/13/2020   Lab Results  Component Value Date   VD25OH 45.01 02/24/2021   VD25OH 52.3 07/13/2020   VD25OH 104.0 (H)  01/27/2020   Lab Results  Component Value Date   WBC 10.0 02/08/2021   HGB 13.9 02/08/2021   HCT 42.0 02/08/2021   MCV 91.1 02/08/2021   PLT 239 02/08/2021   Lab Results  Component Value Date   IRON 131 02/08/2021   TIBC 485 (H) 02/08/2021   FERRITIN 26 02/08/2021    Attestation Statements:   Reviewed by clinician on day of visit: allergies, medications, problem list, medical history, surgical history, family history, social history, and previous encounter notes.  Coral Ceo, CMA, am acting as transcriptionist for [employer], PA-C.  I have reviewed the above documentation for accuracy and completeness, and I agree with the above. Abby Potash, PA-C

## 2021-05-26 ENCOUNTER — Ambulatory Visit: Payer: BC Managed Care – PPO | Admitting: Neurology

## 2021-06-02 ENCOUNTER — Other Ambulatory Visit: Payer: Self-pay

## 2021-06-02 ENCOUNTER — Encounter: Payer: Self-pay | Admitting: Family

## 2021-06-02 ENCOUNTER — Other Ambulatory Visit (HOSPITAL_COMMUNITY): Payer: Self-pay

## 2021-06-02 ENCOUNTER — Encounter (INDEPENDENT_AMBULATORY_CARE_PROVIDER_SITE_OTHER): Payer: Self-pay | Admitting: Physician Assistant

## 2021-06-02 ENCOUNTER — Ambulatory Visit (INDEPENDENT_AMBULATORY_CARE_PROVIDER_SITE_OTHER): Payer: BC Managed Care – PPO | Admitting: Physician Assistant

## 2021-06-02 VITALS — BP 118/72 | HR 66 | Temp 98.3°F | Ht 66.0 in | Wt 205.0 lb

## 2021-06-02 DIAGNOSIS — R7301 Impaired fasting glucose: Secondary | ICD-10-CM

## 2021-06-02 DIAGNOSIS — Z6833 Body mass index (BMI) 33.0-33.9, adult: Secondary | ICD-10-CM

## 2021-06-02 DIAGNOSIS — E669 Obesity, unspecified: Secondary | ICD-10-CM | POA: Diagnosis not present

## 2021-06-02 DIAGNOSIS — R0602 Shortness of breath: Secondary | ICD-10-CM

## 2021-06-02 DIAGNOSIS — Z9189 Other specified personal risk factors, not elsewhere classified: Secondary | ICD-10-CM

## 2021-06-02 MED ORDER — OZEMPIC (0.25 OR 0.5 MG/DOSE) 2 MG/1.5ML ~~LOC~~ SOPN
0.2500 mg | PEN_INJECTOR | SUBCUTANEOUS | 0 refills | Status: DC
  Filled 2021-06-02: qty 1.5, 56d supply, fill #0

## 2021-06-02 NOTE — Progress Notes (Signed)
Chief Complaint:   OBESITY Tamara Ball is here to discuss her progress with her obesity treatment plan along with follow-up of her obesity related diagnoses. Tamara Ball is on keeping a food journal and adhering to recommended goals of 1500 calories and 95 grams of protein and states she is following her eating plan approximately 100% of the time. Tamara Ball states she is doing pilates for 30 minutes 2 times per week.  Today's visit was #: 67 Starting weight: 205 lbs Starting date: 06/13/2020 Today's weight: 205 lbs Today's date: 06/02/2021 Total lbs lost to date: 0 Total lbs lost since last in-office visit: 0  Interim History: [name] reports that she is following plan but is over snacking. She is not an emotional eater, but  feels that she maybe overeating her protein. She is not journaling consistently, but the days she journaled she was short on calories and protein. She is traveling a lot for work in February.  Subjective:   1. Impaired fasting glucose Tamara Ball did not start Ozempic yet, as it is on back order.  2. SOB (shortness of breath) on exertion Tamara Ball notes no dizziness or palpations.   3. At risk for constipation Tamara Ball is at risk for constipation due to starting Ozempic.  Assessment/Plan:   1. Impaired fasting glucose We will refill Ozempic. Ozempic was sent to [facility].  - Semaglutide,0.25 or 0.5MG /DOS, (OZEMPIC, 0.25 OR 0.5 MG/DOSE,) 2 MG/1.5ML SOPN; Inject 0.25 mg into the skin once a week.  Dispense: 1.5 mL; Refill: 0  2. SOB (shortness of breath) on exertion We will check labs today. Tamara Ball does feel that she gets out of breath more easily that she used to when she exercises. Tamara Ball's shortness of breath appears to be obesity related and exercise induced. She has agreed to work on weight loss and gradually increase exercise to treat her exercise induced shortness of breath. Will continue to monitor closely.   3. At risk for constipation Tamara Ball was given approximately  15 minutes of counseling today regarding prevention of constipation. She was encouraged to increase water and fiber intake.    4. obesity with current BMI of 33.1 Tamara Ball is currently in the action stage of change. As such, her goal is to continue with weight loss efforts. She has agreed to keeping a food journal and adhering to recommended goals of 1500 calories and 95 grams of protein daily.   Exercise goals:  As is.  Behavioral modification strategies: planning for success and keeping a strict food journal.  Tamara Ball has agreed to follow-up with our clinic in 2 weeks. She was informed of the importance of frequent follow-up visits to maximize her success with intensive lifestyle modifications for her multiple health conditions.   Tamara Ball was informed we would discuss her lab results at her next visit unless there is a critical issue that needs to be addressed sooner. Tamara Ball agreed to keep her next visit at the agreed upon time to discuss these results.  Objective:   Blood pressure 118/72, pulse 66, temperature 98.3 F (36.8 C), height 5\' 6"  (1.676 m), weight 205 lb (93 kg), SpO2 100 %. Body mass index is 33.09 kg/m.  General: Cooperative, alert, well developed, in no acute distress. HEENT: Conjunctivae and lids unremarkable. Cardiovascular: Regular rhythm.  Lungs: Normal work of breathing. Neurologic: No focal deficits.   Lab Results  Component Value Date   CREATININE 0.83 02/24/2021   BUN 15 02/24/2021   NA 138 02/24/2021   K 4.0 02/24/2021   CL  105 02/24/2021   CO2 27 02/24/2021   Lab Results  Component Value Date   ALT 10 02/24/2021   AST 11 02/24/2021   ALKPHOS 53 02/24/2021   BILITOT 0.4 02/24/2021   Lab Results  Component Value Date   HGBA1C 5.0 07/13/2020   HGBA1C 5.1 01/27/2020   HGBA1C 4.9 06/17/2019   HGBA1C 5.0 05/27/2018   HGBA1C 5.1 05/22/2017   Lab Results  Component Value Date   INSULIN 7.6 07/13/2020   INSULIN 12.6 01/27/2020   INSULIN 9.1  06/17/2019   INSULIN 8.9 06/13/2018   Lab Results  Component Value Date   TSH 3.12 02/24/2021   Lab Results  Component Value Date   CHOL 140 07/13/2020   HDL 63 07/13/2020   LDLCALC 65 07/13/2020   TRIG 53 07/13/2020   CHOLHDL 2.2 07/13/2020   Lab Results  Component Value Date   VD25OH 45.01 02/24/2021   VD25OH 52.3 07/13/2020   VD25OH 104.0 (H) 01/27/2020   Lab Results  Component Value Date   WBC 10.0 02/08/2021   HGB 13.9 02/08/2021   HCT 42.0 02/08/2021   MCV 91.1 02/08/2021   PLT 239 02/08/2021   Lab Results  Component Value Date   IRON 131 02/08/2021   TIBC 485 (H) 02/08/2021   FERRITIN 26 02/08/2021    Attestation Statements:   Reviewed by clinician on day of visit: allergies, medications, problem list, medical history, surgical history, family history, social history, and previous encounter notes.  I, Tonye Pearson, am acting as Location manager for [employer], PA-C.  I have reviewed the above documentation for accuracy and completeness, and I agree with the above. Abby Potash, PA-C

## 2021-06-03 ENCOUNTER — Encounter: Payer: Self-pay | Admitting: Family Medicine

## 2021-06-03 ENCOUNTER — Ambulatory Visit (INDEPENDENT_AMBULATORY_CARE_PROVIDER_SITE_OTHER): Payer: BC Managed Care – PPO | Admitting: Family Medicine

## 2021-06-03 VITALS — BP 100/58 | HR 68 | Temp 98.5°F | Ht 66.0 in | Wt 212.4 lb

## 2021-06-03 DIAGNOSIS — G43809 Other migraine, not intractable, without status migrainosus: Secondary | ICD-10-CM | POA: Diagnosis not present

## 2021-06-03 DIAGNOSIS — Z809 Family history of malignant neoplasm, unspecified: Secondary | ICD-10-CM | POA: Diagnosis not present

## 2021-06-03 DIAGNOSIS — B360 Pityriasis versicolor: Secondary | ICD-10-CM

## 2021-06-03 DIAGNOSIS — Z0000 Encounter for general adult medical examination without abnormal findings: Secondary | ICD-10-CM

## 2021-06-03 MED ORDER — FLUCONAZOLE 150 MG PO TABS: 300.0000 mg | ORAL_TABLET | ORAL | 0 refills | Status: DC

## 2021-06-03 NOTE — Progress Notes (Signed)
Mayo Ao DOB: Oct 01, 1980 Encounter date: 06/03/2021  This is a 41 y.o. female who presents for complete physical   History of present illness/Additional concerns: At last visit discussed uncontrolled headache/migraine. Insurance will not cover monthly injectable without:  8 week trial of antiepileptic (topamax, valproate, divalproex), beta blocker, and amitriptyline/effexor. Appointment with neuro is 06/29/21. Last 2 months when cycle has come on; she has had such a bad headache that she has had to call in or go home - nausea, head pain was severe. OTC medications not cutting it. Lasts 4-5 days before cycle and then 5-7 days through cycle. Less frequent headaches other days of month. Usually not migraine on off - cycle days. (Did try nortriptylline in the past 2016 - took this for 9 months).   Sees dr.cousins for gyn care; mammogram will be done at her office too. Appointment is coming up.   Has some discoloration on back. One coming up on face. Started small, but has spread out now across middle of back. Been there months; less than a year. Not itchy, not irritated. Doesn't know father's history.   Sister has had 3 tumors; not cancer. But raises worries for patient. Mom had cancer removed from scalp; came back more aggressive. Not sure type.   Has some restless legs type symptoms in legs.    Past Medical History:  Diagnosis Date   Anemia    Anxiety    Chicken pox    Chronic headaches    Constipation    Fibroids    Iron deficiency anemia due to chronic blood loss    from heavy periods   Menorrhagia    Palpitations    History reviewed. No pertinent surgical history. No Known Allergies Current Meds  Medication Sig   AVIANE 0.1-20 MG-MCG tablet    b complex vitamins tablet Take 1 tablet by mouth daily.   Biotin 10000 MCG TABS Take 1 tablet by mouth daily.   diphenhydrAMINE HCl (BENADRYL PO) Take by mouth as needed.   DYMISTA 137-50 MCG/ACT SUSP 1 actuation each nostril twice  daily (Patient taking differently: as needed. 1 actuation each nostril twice daily)   fluconazole (DIFLUCAN) 150 MG tablet Take 2 tablets (300 mg total) by mouth once a week.   Fremanezumab-vfrm (AJOVY) 225 MG/1.5ML SOAJ Inject 225 mg into the skin every 30 (thirty) days.   Insulin Pen Needle (BD PEN NEEDLE NANO 2ND GEN) 32G X 4 MM MISC 1 Package by Does not apply route daily.   levocetirizine (XYZAL) 5 MG tablet Take 1 tablet (5 mg total) by mouth every evening.   LORazepam (ATIVAN) 0.5 MG tablet Take 1 tablet (0.5 mg total) by mouth 2 (two) times daily as needed for anxiety.   montelukast (SINGULAIR) 10 MG tablet Take 1 tablet (10 mg total) by mouth at bedtime.   Multiple Vitamin (MULTIVITAMIN WITH MINERALS) TABS tablet Take 1 tablet by mouth daily.   Omega-3 Fatty Acids (FISH OIL) 1000 MG CAPS Take 1 capsule by mouth daily.   polyethylene glycol (MIRALAX / GLYCOLAX) 17 g packet Take 17 g by mouth daily as needed.   Semaglutide,0.25 or 0.5MG /DOS, (OZEMPIC, 0.25 OR 0.5 MG/DOSE,) 2 MG/1.5ML SOPN Inject 0.25 mg into the skin once a week.   VITAMIN D PO Take 10,000 Units by mouth once a week.   Social History   Tobacco Use   Smoking status: Never   Smokeless tobacco: Never  Substance Use Topics   Alcohol use: Yes    Alcohol/week: 0.0  standard drinks    Comment: social   Family History  Problem Relation Age of Onset   Hypertension Mother    Cancer Mother        skin cancer ?type but required radiation   Healthy Maternal Grandmother    Diabetes Maternal Grandfather    Glaucoma Maternal Grandfather    Hypertension Maternal Grandfather    Hyperlipidemia Maternal Grandfather    Gout Maternal Grandfather    Hypertension Maternal Uncle    Colon polyps Neg Hx    Colon cancer Neg Hx    Esophageal cancer Neg Hx      Review of Systems  Constitutional:  Negative for activity change, appetite change, chills, fatigue, fever and unexpected weight change.  HENT:  Negative for congestion, ear  pain, hearing loss, sinus pressure, sinus pain, sore throat and trouble swallowing.   Eyes:  Negative for pain and visual disturbance.  Respiratory:  Negative for cough, chest tightness, shortness of breath and wheezing.   Cardiovascular:  Negative for chest pain, palpitations and leg swelling.  Gastrointestinal:  Negative for abdominal pain, blood in stool, constipation, diarrhea, nausea and vomiting.  Genitourinary:  Negative for difficulty urinating and menstrual problem.  Musculoskeletal:  Negative for arthralgias and back pain.  Skin:  Positive for rash.  Neurological:  Positive for headaches. Negative for dizziness, weakness and numbness.  Hematological:  Negative for adenopathy. Does not bruise/bleed easily.  Psychiatric/Behavioral:  Negative for sleep disturbance and suicidal ideas. The patient is not nervous/anxious.    CBC:  Lab Results  Component Value Date   WBC 10.0 02/08/2021   WBC 9.4 06/20/2020   HGB 13.9 02/08/2021   HGB 14.2 04/20/2017   HCT 42.0 02/08/2021   HCT 43.2 04/20/2017   MCH 30.2 02/08/2021   MCHC 33.1 02/08/2021   RDW 12.9 02/08/2021   RDW 13.5 04/20/2017   PLT 239 02/08/2021   PLT 199 04/20/2017   CMP: Lab Results  Component Value Date   NA 138 02/24/2021   NA 139 01/27/2020   K 4.0 02/24/2021   K 4.2 08/11/2016   CL 105 02/24/2021   CL 103 08/11/2016   CO2 27 02/24/2021   CO2 26 08/11/2016   ANIONGAP 9 06/20/2020   GLUCOSE 90 02/24/2021   BUN 15 02/24/2021   BUN 11 01/27/2020   CREATININE 0.83 02/24/2021   CREATININE 0.87 08/11/2016   LABGLOB 2.9 01/27/2020   GFRAA 83 01/27/2020   CALCIUM 9.2 02/24/2021   CALCIUM 9.5 08/11/2016   PROT 7.0 02/24/2021   PROT 7.4 01/27/2020   AGRATIO 1.6 01/27/2020   BILITOT 0.4 02/24/2021   BILITOT 0.5 01/27/2020   ALKPHOS 53 02/24/2021   ALKPHOS 58 08/11/2016   ALT 10 02/24/2021   ALT 8 08/11/2016   AST 11 02/24/2021   AST 9 08/11/2016   LIPID: Lab Results  Component Value Date   CHOL 140  07/13/2020   TRIG 53 07/13/2020   HDL 63 07/13/2020   LDLCALC 65 07/13/2020   LABVLDL 12 07/13/2020    Objective:  BP (!) 100/58 (BP Location: Left Arm, Patient Position: Sitting, Cuff Size: Large)    Pulse 68    Temp 98.5 F (36.9 C) (Oral)    Ht 5\' 6"  (1.676 m)    Wt 212 lb 6.4 oz (96.3 kg)    LMP 05/31/2021 (Approximate)    BMI 34.28 kg/m   Weight: 212 lb 6.4 oz (96.3 kg)   BP Readings from Last 3 Encounters:  06/03/21 (!) 100/58  06/02/21 118/72  05/18/21 113/72   Wt Readings from Last 3 Encounters:  06/03/21 212 lb 6.4 oz (96.3 kg)  06/02/21 205 lb (93 kg)  05/18/21 205 lb (93 kg)    Physical Exam Constitutional:      General: She is not in acute distress.    Appearance: She is well-developed.  HENT:     Head: Normocephalic and atraumatic.     Right Ear: External ear normal.     Left Ear: External ear normal.     Mouth/Throat:     Pharynx: No oropharyngeal exudate.  Eyes:     Conjunctiva/sclera: Conjunctivae normal.     Pupils: Pupils are equal, round, and reactive to light.  Neck:     Thyroid: No thyromegaly.  Cardiovascular:     Rate and Rhythm: Normal rate and regular rhythm.     Heart sounds: Normal heart sounds. No murmur heard.   No friction rub. No gallop.  Pulmonary:     Effort: Pulmonary effort is normal.     Breath sounds: Normal breath sounds.  Abdominal:     General: Bowel sounds are normal. There is no distension.     Palpations: Abdomen is soft. There is no mass.     Tenderness: There is no abdominal tenderness. There is no guarding.     Hernia: No hernia is present.  Musculoskeletal:        General: No tenderness or deformity. Normal range of motion.     Cervical back: Normal range of motion and neck supple.  Lymphadenopathy:     Cervical: No cervical adenopathy.  Skin:    General: Skin is warm and dry.     Findings: No rash.     Comments: Multiple round/oval hypopigmented macules over upper back.  Neurological:     Mental Status: She  is alert and oriented to person, place, and time.     Deep Tendon Reflexes: Reflexes normal.     Reflex Scores:      Tricep reflexes are 2+ on the right side and 2+ on the left side.      Bicep reflexes are 2+ on the right side and 2+ on the left side.      Brachioradialis reflexes are 2+ on the right side and 2+ on the left side.      Patellar reflexes are 2+ on the right side and 2+ on the left side. Psychiatric:        Speech: Speech normal.        Behavior: Behavior normal.        Thought Content: Thought content normal.    Assessment/Plan: There are no preventive care reminders to display for this patient.  Health Maintenance reviewed - up to date.  1. Preventative health care Encouraged regular exercise.  Discussed the benefits for sleep, headaches, and overall health.  2. Family history of cancer - Ambulatory referral to Genetics  3. Tinea versicolor Right me know if rash is not better after treatment - fluconazole (DIFLUCAN) 150 MG tablet; Take 2 tablets (300 mg total) by mouth once a week.  Dispense: 4 tablet; Refill: 0 - Ambulatory referral to Dermatology  4. Other migraine without status migrainosus, not intractable Discussed insurance restrictions on migraine preventative treatments.  She did not want to try a beta-blocker due to worry of fatigue.  Potential side effects with Topamax (cognitive, nerve) worried her.  She has tried nortriptyline in the past.  She prefers to wait we will even discuss treatment options  with neurology at upcoming appointment.  Return in about 1 year (around 06/03/2022) for physical exam.  Micheline Rough, MD

## 2021-06-08 ENCOUNTER — Telehealth: Payer: Self-pay | Admitting: Genetic Counselor

## 2021-06-08 NOTE — Telephone Encounter (Signed)
Scheduled appt per 1/27 referral. Spoke to pt who is aware of appt date and time. Pt is aware to arrive 15 mins prior to appt time.  °

## 2021-06-15 ENCOUNTER — Other Ambulatory Visit: Payer: Self-pay

## 2021-06-15 ENCOUNTER — Encounter (INDEPENDENT_AMBULATORY_CARE_PROVIDER_SITE_OTHER): Payer: Self-pay | Admitting: Physician Assistant

## 2021-06-15 ENCOUNTER — Ambulatory Visit (INDEPENDENT_AMBULATORY_CARE_PROVIDER_SITE_OTHER): Payer: BC Managed Care – PPO | Admitting: Physician Assistant

## 2021-06-15 VITALS — HR 81 | Temp 98.8°F | Ht 66.0 in | Wt 204.0 lb

## 2021-06-15 DIAGNOSIS — E669 Obesity, unspecified: Secondary | ICD-10-CM | POA: Diagnosis not present

## 2021-06-15 DIAGNOSIS — R7301 Impaired fasting glucose: Secondary | ICD-10-CM

## 2021-06-15 DIAGNOSIS — Z6832 Body mass index (BMI) 32.0-32.9, adult: Secondary | ICD-10-CM | POA: Diagnosis not present

## 2021-06-15 DIAGNOSIS — Z9189 Other specified personal risk factors, not elsewhere classified: Secondary | ICD-10-CM | POA: Diagnosis not present

## 2021-06-15 MED ORDER — OZEMPIC (0.25 OR 0.5 MG/DOSE) 2 MG/1.5ML ~~LOC~~ SOPN: 0.5000 mg | PEN_INJECTOR | SUBCUTANEOUS | 0 refills | Status: DC

## 2021-06-15 NOTE — Progress Notes (Signed)
Chief Complaint:   OBESITY Tamara Ball is here to discuss her progress with her obesity treatment plan along with follow-up of her obesity related diagnoses. Tamara Ball is on keeping a food journal and adhering to recommended goals of 1500 calories and 95 grams of protein daily and states she is following her eating plan approximately 100% of the time. Tamara Ball states she is doing 0 minutes 0 times per week.  Today's visit was #: 7 Starting weight: 205 lbs Starting date: 06/13/2020 Today's weight: 204 lbs Today's date: 06/15/2021 Total lbs lost to date: 1 Total lbs lost since last in-office visit: 1  Interim History: Ellsie journaled everyday last week, and she is averaging 500 calories for snacks because she is hungry all the time. Then she doesn't have enough calories for a good dinner. There are some days that she did not reach her calories or protein goals.  Subjective:   1. Impaired fasting glucose Tamara Ball reports that 0.25 mg of Ozempic is not controlling her appetite. She has not noticed a difference since starting it.  2. At risk for diabetes mellitus Tamara Ball is at higher than average risk for developing diabetes due to her obesity.  Assessment/Plan:   1. Impaired fasting glucose Tamara Ball agreed to increase Ozempic to 0.5 mg, with no refills. We will follow up at her next visit.  - Semaglutide,0.25 or 0.5MG /DOS, (OZEMPIC, 0.25 OR 0.5 MG/DOSE,) 2 MG/1.5ML SOPN; Inject 0.5 mg into the skin once a week.  Dispense: 1.5 mL; Refill: 0  2. At risk for diabetes mellitus Tamara Ball was given approximately 15 minutes of diabetic education and counseling today. We discussed intensive lifestyle modifications today with an emphasis on weight loss as well as increasing exercise and decreasing simple carbohydrates in her diet. We also reviewed medication options with an emphasis on risk versus benefits of those discussed.  Repetitive spaced learning was employed today to elicit superior memory formation  and behavioral change.  3. Obesity with current BMI of 32.94 Tamara Ball is currently in the action stage of change. As such, her goal is to continue with weight loss efforts. She has agreed to keeping a food journal and adhering to recommended goals of 1200-1400 calories and 95 grams of protein daily.   Exercise goals: No exercise has been prescribed at this time.  Behavioral modification strategies: increasing lean protein intake, decreasing simple carbohydrates, no skipping meals, and meal planning and cooking strategies.  Kent has agreed to follow-up with our clinic in 3 weeks. She was informed of the importance of frequent follow-up visits to maximize her success with intensive lifestyle modifications for her multiple health conditions.   Objective:   Pulse 81, temperature 98.8 F (37.1 C), height 5\' 6"  (1.676 m), weight 204 lb (92.5 kg), last menstrual period 05/31/2021, SpO2 100 %. Body mass index is 32.93 kg/m.  General: Cooperative, alert, well developed, in no acute distress. HEENT: Conjunctivae and lids unremarkable. Cardiovascular: Regular rhythm.  Lungs: Normal work of breathing. Neurologic: No focal deficits.   Lab Results  Component Value Date   CREATININE 0.83 02/24/2021   BUN 15 02/24/2021   NA 138 02/24/2021   K 4.0 02/24/2021   CL 105 02/24/2021   CO2 27 02/24/2021   Lab Results  Component Value Date   ALT 10 02/24/2021   AST 11 02/24/2021   ALKPHOS 53 02/24/2021   BILITOT 0.4 02/24/2021   Lab Results  Component Value Date   HGBA1C 5.0 07/13/2020   HGBA1C 5.1 01/27/2020   HGBA1C  4.9 06/17/2019   HGBA1C 5.0 05/27/2018   HGBA1C 5.1 05/22/2017   Lab Results  Component Value Date   INSULIN 7.6 07/13/2020   INSULIN 12.6 01/27/2020   INSULIN 9.1 06/17/2019   INSULIN 8.9 06/13/2018   Lab Results  Component Value Date   TSH 3.12 02/24/2021   Lab Results  Component Value Date   CHOL 140 07/13/2020   HDL 63 07/13/2020   LDLCALC 65 07/13/2020    TRIG 53 07/13/2020   CHOLHDL 2.2 07/13/2020   Lab Results  Component Value Date   VD25OH 45.01 02/24/2021   VD25OH 52.3 07/13/2020   VD25OH 104.0 (H) 01/27/2020   Lab Results  Component Value Date   WBC 10.0 02/08/2021   HGB 13.9 02/08/2021   HCT 42.0 02/08/2021   MCV 91.1 02/08/2021   PLT 239 02/08/2021   Lab Results  Component Value Date   IRON 131 02/08/2021   TIBC 485 (H) 02/08/2021   FERRITIN 26 02/08/2021   Attestation Statements:   Reviewed by clinician on day of visit: allergies, medications, problem list, medical history, surgical history, family history, social history, and previous encounter notes.   Wilhemena Durie, am acting as transcriptionist for [employer], PA-C.  I have reviewed the above documentation for accuracy and completeness, and I agree with the above. Abby Potash, PA-C

## 2021-06-28 NOTE — Progress Notes (Signed)
NEUROLOGY CONSULTATION NOTE  Tamara Ball MRN: [id number] DOB: 1981-04-01  Referring provider: Micheline Rough, MD Primary care provider: Micheline Rough, MD  Reason for consult:  migraines  Assessment/Plan:   Stroke like episode likely migraine with aura Migraine without aura, without status migrainosus, not intractable Chronic tension-type headache Chiari 1 malformation - incidental  Migraine prevention:  Start Aimovig 140mg  every 28 days Migraine rescue:  She will try Ubrelvy 100mg  and let me know if effective.  Zofran for nausea Stop Excedrin.  Limit use of pain relievers to no more than 2 days out of week to prevent risk of rebound or medication-overuse headache. Keep headache diary Follow up 4 months.    Subjective:  Tamara Ball is a 41 year old right-handed female with migraines, iron deficiency anemia, who presents for chronic tension type headaches and episodic migraines  TENSION-TYPE HEADACHES: Onset:  36-50 years old Location:  Top of head and bi-temporal, behind eyes Quality:  Pressure-like, sharp pain on top of head Intensity:  5-6/10 Aura:  no Prodrome:  no Associated symptoms:  none Duration:  all day Frequency:  daily Triggers/exacerbating factors:  sinus congestion Relieving factors:  none Activity:  Able to function    MIGRAINE: Onset:  18-19 Location:  Top of head and bi-temporal, behind eyes Quality:  throbbing/pounding Intensity:  8/10 Aura:  no Prodrome:  no Associated symptoms:  Dizziness, nausea, photophobia Duration:  all day Frequency:  2-3 days (week before to week of her cycle) Triggers/exacerbating factors:  sinus congestion, menstrual cycle (inconsistent) Relieving factors:  none Activity:  Lays down in dark  She was seen in the ED on 06/20/2020 for presumed complicated migraine presenting with headache and left sided facial and arm numbness out of sleep.  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  Lasted just that day.  Denies double vision, dysphagia, ongoing numbness and weakness of extremities.  Current NSAIDS/analgesics:  Excedrin Tension (daily) Current triptans:  none Current ergotamine:  none Current anti-emetic:  none Current muscle relaxants:  none Current Antihypertensive medications:  none Current Antidepressant medications:  none Current Anticonvulsant medications:  none Current anti-CGRP:  none Current Vitamins/Herbal/Supplements:  none Current Antihistamines/Decongestants:  Benadryl, Xyzal Other therapy:  Biofreeze Hormone/birth control:  yes - just started for fibroid - doesn't remember name Other medications:  Ativan PRN (anxiety)  Past NSAIDS/analgesics:  acetaminophen, ibuprofen.  Advised to avoid NSAIDs due to anemia and questionable GI bleed. Past abortive triptans:  rizatriptan 10mg , sumatriptan 50mg  Past abortive ergotamine:  none Past muscle relaxants:  cyclobenzaprine, tizanidine Past anti-emetic:  Zofran Past antihypertensive medications:  none Past antidepressant medications:  nortriptyline, Wellbutrin Past anticonvulsant medications:  zonisamide Past anti-CGRP:  none Past vitamins/Herbal/Supplements:  none Past antihistamines/decongestants:  Zyrtec, Flonase, Claritin Other past therapies:  massage therapy, trigger point injections  Caffeine:  No daily caffeine Alcohol:  no Smoker:  no Diet:  Needs to drink more water.  Zero-sugar soda Exercise: 1 to 2 times a week Depression/stress:  Stress related to work (works as Scientist, physiological for [employer]) Sleep hygiene:  poor.  Tosses and turns.  Sleep study negative.   Family history of headache:  Father's side unknown.  Her son has headaches.  No known family history of intracranial aneurysms.      PAST MEDICAL HISTORY: Past Medical History:  Diagnosis Date   Anemia    Anxiety    Chicken  pox     Chronic headaches    Constipation    Fibroids    Iron deficiency anemia due to chronic blood loss    from heavy periods   Menorrhagia    Palpitations     PAST SURGICAL HISTORY: No past surgical history on file.  MEDICATIONS: Current Outpatient Medications on File Prior to Visit  Medication Sig Dispense Refill   AVIANE 0.1-20 MG-MCG tablet      b complex vitamins tablet Take 1 tablet by mouth daily.     Biotin 10000 MCG TABS Take 1 tablet by mouth daily.     cetirizine (ZYRTEC) 10 MG tablet Take 1 tablet (10 mg total) by mouth daily. 30 tablet 0   diphenhydrAMINE HCl (BENADRYL PO) Take by mouth as needed.     DYMISTA 137-50 MCG/ACT SUSP 1 actuation each nostril twice daily (Patient taking differently: as needed. 1 actuation each nostril twice daily) 23 g 3   fluconazole (DIFLUCAN) 150 MG tablet Take 2 tablets (300 mg total) by mouth once a week. 4 tablet 0   fluticasone (FLONASE) 50 MCG/ACT nasal spray Place 2 sprays into both nostrils daily. 16 g 5   Fremanezumab-vfrm (AJOVY) 225 MG/1.5ML SOAJ Inject 225 mg into the skin every 30 (thirty) days. 4.5 mL 3   Insulin Pen Needle (BD PEN NEEDLE NANO 2ND GEN) 32G X 4 MM MISC 1 Package by Does not apply route daily. 100 each 0   levocetirizine (XYZAL) 5 MG tablet Take 1 tablet (5 mg total) by mouth every evening. 90 tablet 1   LORazepam (ATIVAN) 0.5 MG tablet Take 1 tablet (0.5 mg total) by mouth 2 (two) times daily as needed for anxiety. 30 tablet 1   montelukast (SINGULAIR) 10 MG tablet Take 1 tablet (10 mg total) by mouth at bedtime. 90 tablet 1   Multiple Vitamin (MULTIVITAMIN WITH MINERALS) TABS tablet Take 1 tablet by mouth daily.     Omega-3 Fatty Acids (FISH OIL) 1000 MG CAPS Take 1 capsule by mouth daily.     polyethylene glycol (MIRALAX / GLYCOLAX) 17 g packet Take 17 g by mouth daily as needed.     Semaglutide,0.25 or 0.5MG /DOS, (OZEMPIC, 0.25 OR 0.5 MG/DOSE,) 2 MG/1.5ML SOPN Inject 0.5 mg into the skin once a week. 1.5 mL 0    VITAMIN D PO Take 10,000 Units by mouth once a week.     No current facility-administered medications on file prior to visit.    ALLERGIES: No Known Allergies  FAMILY HISTORY: Family History  Problem Relation Age of Onset   Hypertension Mother    Cancer Mother        skin cancer ?type but required radiation   Healthy Maternal Grandmother    Diabetes Maternal Grandfather    Glaucoma Maternal Grandfather    Hypertension Maternal Grandfather    Hyperlipidemia Maternal Grandfather    Gout Maternal Grandfather    Hypertension Maternal Uncle    Colon polyps Neg Hx    Colon cancer Neg Hx    Esophageal cancer Neg Hx     Objective:  Blood pressure 119/78, pulse 89, height 5\' 6"  (1.676 m), weight 203 lb (92.1 kg), last menstrual period 05/31/2021, SpO2 100 %. General: No acute distress.  Patient appears well-groomed.   Head:  Normocephalic/atraumatic Eyes:  fundi examined but not visualized Neck: supple, no paraspinal tenderness, full range of motion Back: No paraspinal tenderness Heart: regular rate and rhythm Lungs: Clear to auscultation bilaterally. Vascular: No carotid bruits. Neurological Exam:  Mental status: alert and oriented to person, place, and time, recent and remote memory intact, fund of knowledge intact, attention and concentration intact, speech fluent and not dysarthric, language intact. Cranial nerves: CN I: not tested CN II: pupils equal, round and reactive to light, visual fields intact CN III, IV, VI:  full range of motion, no nystagmus, no ptosis CN V: facial sensation intact. CN VII: upper and lower face symmetric CN VIII: hearing intact CN IX, X: gag intact, uvula midline CN XI: sternocleidomastoid and trapezius muscles intact CN XII: tongue midline Bulk & Tone: normal, no fasciculations. Motor:  muscle strength 5/5 throughout Sensation:  Pinprick, temperature and vibratory sensation intact. Deep Tendon Reflexes:  2+ throughout,  toes downgoing.    Finger to nose testing:  Without dysmetria.   Heel to shin:  Without dysmetria.   Gait:  Normal station and stride.  Romberg negative.    Thank you for allowing me to take part in the care of this patient.  Metta Clines, DO  CC: Micheline Rough, MD

## 2021-06-29 ENCOUNTER — Encounter: Payer: Self-pay | Admitting: Neurology

## 2021-06-29 ENCOUNTER — Ambulatory Visit: Payer: BC Managed Care – PPO | Admitting: Neurology

## 2021-06-29 ENCOUNTER — Other Ambulatory Visit: Payer: Self-pay

## 2021-06-29 VITALS — BP 119/78 | HR 89 | Ht 66.0 in | Wt 203.0 lb

## 2021-06-29 DIAGNOSIS — G43709 Chronic migraine without aura, not intractable, without status migrainosus: Secondary | ICD-10-CM

## 2021-06-29 DIAGNOSIS — G44229 Chronic tension-type headache, not intractable: Secondary | ICD-10-CM

## 2021-06-29 DIAGNOSIS — G935 Compression of brain: Secondary | ICD-10-CM | POA: Diagnosis not present

## 2021-06-29 DIAGNOSIS — G43109 Migraine with aura, not intractable, without status migrainosus: Secondary | ICD-10-CM | POA: Diagnosis not present

## 2021-06-29 MED ORDER — AIMOVIG 140 MG/ML ~~LOC~~ SOAJ: 140.0000 mg | SUBCUTANEOUS | 11 refills | Status: DC

## 2021-06-29 MED ORDER — ONDANSETRON 4 MG PO TBDP: 4.0000 mg | ORAL_TABLET | Freq: Three times a day (TID) | ORAL | 5 refills | Status: DC | PRN

## 2021-06-29 NOTE — Progress Notes (Signed)
Medication Samples have been provided to the patient.  Drug name: Renne Crigler       Strength: 100 mg        Qty: 4  LOT: [id number]  Exp.Date: 10/2022/11/2021  Dosing instructions: as needed  The patient has been instructed regarding the correct time, dose, and frequency of taking this medication, including desired effects and most common side effects.   Venetia Night 3:23 PM 06/29/2021

## 2021-06-29 NOTE — Patient Instructions (Signed)
°  Start Aimovig 140mg  every 28 days.   Take Ubrelvy 100mg  at earliest onset of headache.  May repeat dose once in 2 hours if needed.  Maximum 2 tablets in 24 hours - let me know if effective Ondansetron for nausea Stop Excedrin Tension.  Limit use of pain relievers to no more than 2 days out of the week.  These medications include acetaminophen, NSAIDs (ibuprofen/Advil/Motrin, naproxen/Aleve, triptans (Imitrex/sumatriptan), Excedrin, and narcotics.  This will help reduce risk of rebound headaches. Be aware of common food triggers:  - Caffeine:  coffee, black tea, cola, Mt. Dew  - Chocolate  - Dairy:  aged cheeses (brie, blue, cheddar, gouda, [place], provolone, [place], Swiss, etc), chocolate milk, buttermilk, sour cream, limit eggs and yogurt  - Nuts, peanut butter  - Alcohol  - Cereals/grains:  FRESH breads (fresh bagels, sourdough, doughnuts), yeast productions  - Processed/canned/aged/cured meats (pre-packaged deli meats, hotdogs)  - MSG/glutamate:  soy sauce, flavor enhancer, pickled/preserved/marinated foods  - Sweeteners:  aspartame (Equal, Nutrasweet).  Sugar and Splenda are okay  - Vegetables:  legumes (lima beans, lentils, snow peas, fava beans, pinto peans, peas, garbanzo beans), sauerkraut, onions, olives, pickles  - Fruit:  avocados, bananas, citrus fruit (orange, lemon, grapefruit), mango  - Other:  Frozen meals, macaroni and cheese Routine exercise Stay adequately hydrated (aim for 64 oz water daily) Keep headache diary Maintain proper stress management Maintain proper sleep hygiene Do not skip meals Consider supplements:  magnesium citrate 400mg  daily, riboflavin 400mg  daily, coenzyme Q10 100mg  three times daily.

## 2021-06-30 ENCOUNTER — Inpatient Hospital Stay: Payer: BC Managed Care – PPO | Attending: Genetic Counselor | Admitting: Genetic Counselor

## 2021-06-30 ENCOUNTER — Inpatient Hospital Stay: Payer: BC Managed Care – PPO

## 2021-06-30 ENCOUNTER — Telehealth: Payer: Self-pay

## 2021-06-30 DIAGNOSIS — Z808 Family history of malignant neoplasm of other organs or systems: Secondary | ICD-10-CM

## 2021-06-30 LAB — GENETIC SCREENING ORDER

## 2021-06-30 NOTE — Telephone Encounter (Signed)
New message  Your information has been submitted to [facility]. To check for an updated outcome later, reopen this PA request from your dashboard.  If [facility] has not responded to your request within 24 hours, contact [place] at [phone]. If you think there may be a problem with your PA request, use our live chat feature at the bottom right.  Lattie Corns ([facility] Dolphin) Rx #: [id number] Aimovig 140MG /ML auto-injectors   Form [facility] Electronic PA Form [phone] NCPDP) Created 22 hours ago Sent to Plan 5 minutes ago Plan Response 5 minutes ago Submit Clinical Questions 1 minute ago Determination Wait for Determination Please wait for [facility] NCPDP 2017 to return a determination.

## 2021-06-30 NOTE — Telephone Encounter (Signed)
F/u  Received fax from [facility]   Medication Aimovig  Effective date  2.23.2023 to  2.23.24

## 2021-07-01 ENCOUNTER — Encounter: Payer: Self-pay | Admitting: Genetic Counselor

## 2021-07-01 ENCOUNTER — Encounter: Payer: Self-pay | Admitting: Family

## 2021-07-01 NOTE — Progress Notes (Signed)
REFERRING PROVIDER: Caren Macadam, MD [place],  [place] [address]  PRIMARY PROVIDER:  Caren Macadam, MD  PRIMARY REASON FOR VISIT:  1. Family history of skin cancer     HISTORY OF PRESENT ILLNESS:   Ms. Kerchner, a 41 y.o. female, was seen for a [facility] cancer genetics consultation at the request of Dr. Ethlyn Gallery due to a family history of cancer.  Ms. Larmon presents to clinic today to discuss the possibility of a hereditary predisposition to cancer, to discuss genetic testing, and to further clarify her future cancer risks, as well as potential cancer risks for family members.   CANCER HISTORY:  Ms. Forde is a 41 y.o. female with no personal history of cancer.    RISK FACTORS:  First live birth at age 42.  OCP use for approximately 7 years.  Ovaries intact: yes.  Uterus intact: yes.  Menopausal status: premenopausal.  HRT use: 0 years. Colonoscopy: yes; 1 tubular adenoma Mammogram: 2021 Number of breast biopsies: 0 Any excessive radiation exposure in the past: no  Past Medical History:  Diagnosis Date   Anemia    Anxiety    Chicken pox    Chronic headaches    Constipation    Fibroids    Iron deficiency anemia due to chronic blood loss    from heavy periods   Menorrhagia    Palpitations     No past surgical history on file.  Social History   Socioeconomic History   Marital status: Single    Spouse name: Not on file   Number of children: 1   Years of education: Not on file   Highest education level: Not on file  Occupational History   Occupation: Scientist, physiological  Tobacco Use   Smoking status: Never   Smokeless tobacco: Never  Vaping Use   Vaping Use: Never used  Substance and Sexual Activity   Alcohol use: Yes    Alcohol/week: 0.0 standard drinks    Comment: social   Drug use: No   Sexual activity: Not on file  Other Topics Concern   Not on file  Social History Narrative   Work or School: higher education - Field seismologist Situation: lives with 43 yo      Spiritual Beliefs: Christian      Lifestyle: no regular exercise; diet is so so      Social Determinants of Radio broadcast assistant Strain: Not on file  Food Insecurity: Not on file  Transportation Needs: Not on file  Physical Activity: Not on file  Stress: Not on file  Social Connections: Not on file     FAMILY HISTORY:  We obtained a detailed, 4-generation family history.  Significant diagnoses are listed below: Family History  Problem Relation Age of Onset   Cancer Mother        SCC (x2) dx. 37 and again 6 months later   Other Sister        tumors- unknown type   Cancer Maternal Aunt        colorectal cancer     Ms. Zalar mother was diagnosed with squamous cell carcinoma on her scalp at age 44, 6-7 months later she was diagnosed with SCC again and had radiation as part of her treatment. Ms. Vanderheyden sister had a benign tumor removed from her ribcage at age 69 and recently had a tumor on her arm and leg biopsied, she is awaiting the test  results. Her maternal aunt was diagnosed with colorectal cancer at an unknown age.   Ms. Farone does not have any information about her paternal family medical history.   Ms. Dols is unaware of previous family history of genetic testing for hereditary cancer risks. There is no reported Ashkenazi Jewish ancestry.  GENETIC COUNSELING ASSESSMENT: Ms. Eblen is a 41 y.o. female with a family history of cancer which is somewhat suggestive of a hereditary predisposition to cancer. We, therefore, discussed and recommended the following at today's visit.   DISCUSSION: We discussed that 5 - 10% of cancer is hereditary and there are many genes that can be associated with hereditary cancer syndromes.  We discussed that testing is beneficial for several reasons, including knowing about other cancer risks, identifying potential screening and risk-reduction options that may be appropriate, and to  understanding if other family members could be at risk for cancer and allowing them to undergo genetic testing.  We reviewed the characteristics, features and inheritance patterns of hereditary cancer syndromes. We also discussed genetic testing, including the appropriate family members to test, the process of testing, insurance coverage and turn-around-time for results. We discussed the implications of a negative, positive, carrier and/or variant of uncertain significant result.   Ms. Barra was offered a common hereditary cancer panel (47 genes) and an expanded pan-cancer panel (77 genes). Ms. Gutknecht was informed of the benefits and limitations of each panel, including that expanded pan-cancer panels contain several genes that do not have clear management guidelines at this point in time.  We also discussed that as the number of genes included on a panel increases, the chances of variants of uncertain significance increases. After considering the benefits and limitations of each gene panel, Ms. Romack elected to have [facility]'s CancerNext-Expanded Panel+RNA.  The CancerNext-Expanded gene panel offered by [facility] and includes sequencing, rearrangement, and RNA analysis for the following 77 genes: AIP, ALK, APC, ATM, AXIN2, BAP1, BARD1, BLM, BMPR1A, BRCA1, BRCA2, BRIP1, CDC73, CDH1, CDK4, CDKN1B, CDKN2A, CHEK2, CTNNA1, DICER1, FANCC, FH, FLCN, GALNT12, KIF1B, LZTR1, MAX, MEN1, MET, MLH1, MSH2, MSH3, MSH6, MUTYH, NBN, NF1, NF2, NTHL1, PALB2, PHOX2B, PMS2, POT1, PRKAR1A, PTCH1, PTEN, RAD51C, RAD51D, RB1, RECQL, RET, SDHA, SDHAF2, SDHB, SDHC, SDHD, SMAD4, SMARCA4, SMARCB1, SMARCE1, STK11, SUFU, TMEM127, TP53, TSC1, TSC2, VHL and XRCC2 (sequencing and deletion/duplication); EGFR, EGLN1, HOXB13, KIT, MITF, PDGFRA, POLD1, and POLE (sequencing only); EPCAM and GREM1 (deletion/duplication only). ,  We discussed with Ms. Batdorf that the family history does not meet insurance or NCCN criteria for genetic testing and,  therefore, is not highly consistent with a familial hereditary cancer syndrome.  We feel she is at low risk to harbor a gene mutation associated with such a condition. However, she was motivated to pursue genetic testing because she does not have any information about her paternal family medical history.   We discussed that some people do not want to undergo genetic testing due to fear of genetic discrimination.  A federal law called the Genetic Information Non-Discrimination Act (GINA) of 2008 helps protect individuals against genetic discrimination based on their genetic test results.  It impacts both health insurance and employment.  With health insurance, it protects against increased premiums, being kicked off insurance or being forced to take a test in order to be insured.  For employment it protects against hiring, firing and promoting decisions based on genetic test results.  GINA does not apply to those in the [employer], those who work for companies with less than 15 employees,  and new life insurance or long-term disability Engineer, structural.  Health status due to a cancer diagnosis is not protected under GINA.  PLAN: After considering the risks, benefits, and limitations, Ms. Pardy provided informed consent to pursue genetic testing and the blood sample was sent to [facility] for analysis of the CancerNext-Expanded Panel. Results should be available within approximately 2-3 weeks' time, at which point they will be disclosed by telephone to Ms. Bilal, as will any additional recommendations warranted by these results. Ms. Petron will receive a summary of her genetic counseling visit and a copy of her results once available. This information will also be available in Epic.   Ms. Millar questions were answered to her satisfaction today. Our contact information was provided should additional questions or concerns arise. Thank you for the referral and allowing [name] to share in the care of your  patient.   Lucille Passy, MS, [facility] Genetic Counselor [email or web address] (P) [phone]  The patient was seen for a total of 30 minutes in face-to-face genetic counseling. The patient was seen alone.  Drs. Lindi Adie and/or Burr Medico were available to discuss this case as needed.  _______________________________________________________________________ For Office Staff:  Number of people involved in session: 1 Was an Intern/ student involved with case: no

## 2021-07-07 ENCOUNTER — Encounter (INDEPENDENT_AMBULATORY_CARE_PROVIDER_SITE_OTHER): Payer: Self-pay | Admitting: Physician Assistant

## 2021-07-07 ENCOUNTER — Other Ambulatory Visit (HOSPITAL_COMMUNITY): Payer: Self-pay

## 2021-07-07 ENCOUNTER — Ambulatory Visit (INDEPENDENT_AMBULATORY_CARE_PROVIDER_SITE_OTHER): Payer: BC Managed Care – PPO | Admitting: Physician Assistant

## 2021-07-07 ENCOUNTER — Other Ambulatory Visit: Payer: Self-pay

## 2021-07-07 VITALS — BP 110/71 | HR 66 | Temp 97.8°F | Ht 66.0 in | Wt 196.0 lb

## 2021-07-07 DIAGNOSIS — R7301 Impaired fasting glucose: Secondary | ICD-10-CM | POA: Diagnosis not present

## 2021-07-07 DIAGNOSIS — E669 Obesity, unspecified: Secondary | ICD-10-CM

## 2021-07-07 DIAGNOSIS — Z6831 Body mass index (BMI) 31.0-31.9, adult: Secondary | ICD-10-CM | POA: Diagnosis not present

## 2021-07-07 DIAGNOSIS — E66811 Obesity, class 1: Secondary | ICD-10-CM

## 2021-07-07 DIAGNOSIS — Z9189 Other specified personal risk factors, not elsewhere classified: Secondary | ICD-10-CM | POA: Diagnosis not present

## 2021-07-07 MED ORDER — SEMAGLUTIDE (1 MG/DOSE) 4 MG/3ML ~~LOC~~ SOPN
1.0000 mg | PEN_INJECTOR | SUBCUTANEOUS | 0 refills | Status: DC
  Filled 2021-07-07: qty 3, 28d supply, fill #0

## 2021-07-07 NOTE — Progress Notes (Signed)
? ? ? ?Chief Complaint:  ? ?OBESITY ?Tamara Ball is here to discuss her progress with her obesity treatment plan along with follow-up of her obesity related diagnoses. Tamara Ball is on the Category 3 Plan and keeping a food journal and adhering to recommended goals of 1200-1400 calories and 95 grams of protein and states she is following her eating plan approximately 90% of the time. Tamara Ball states she is doing pilates and treadmill for 60 minutes 2 times per week. ? ?Today's visit was #: 26 ?Starting weight: 205 lbs ?Starting date: 06/13/2020 ?Today's weight: 196 lbs ?Today's date: 07/07/2021 ?Total lbs lost to date: 9 lbs ?Total lbs lost since last in-office visit: 8 lbs ? ?Interim History: Tamara Ball is sticking to protein and vegetables since starting Ozempic 1 mg. Her hunger is controlled. She is making better choices when traveling. Many days, especially the first few days after injection of Ozempic, she is not hungry and is struggling to get in all of her food.  ? ?Subjective:  ? ?1. Impaired fasting glucose ?Tamara Ball is now on 1 mg Ozempic, which helps control her appetite. When she eats off plan, Ozempic gives her diarrhea.  ? ?2. At risk for impaired metabolic function ?Tamara Ball is at increased risk for impaired metabolic function due to not eating all of her food daily. ? ?Assessment/Plan:  ? ?1. Impaired fasting glucose ?We will refill Ozempoic 1 mg for 1 month with no refills.  ? ?- Semaglutide, 1 MG/DOSE, 4 MG/3ML SOPN; Inject 1 mg as directed once a week.  Dispense: 3 mL; Refill: 0 ? ?2. At risk for impaired metabolic function ?Tamara Ball was given approximately 15 minutes of impaired  metabolic function prevention counseling today. We discussed intensive lifestyle modifications today with an emphasis on specific nutrition and exercise instructions and strategies.  ? ?Repetitive spaced learning was employed today to elicit superior memory formation and behavioral change.  ? ?3. obesity with current BMI of 31.65 ?Tamara Ball is  currently in the action stage of change. As such, her goal is to continue with weight loss efforts. She has agreed to keeping a food journal and adhering to recommended goals of 1200-1400 calories and 95 grams of protein daily.  ? ?Exercise goals:  As is. ? ?Behavioral modification strategies: increasing lean protein intake and meal planning and cooking strategies. ? ?Tamara Ball has agreed to follow-up with our clinic in 3 weeks. She was informed of the importance of frequent follow-up visits to maximize her success with intensive lifestyle modifications for her multiple health conditions.  ? ?Objective:  ? ?Blood pressure 110/71, pulse 66, temperature 97.8 ?F (36.6 ?C), height 5\' 6"  (1.676 m), weight 196 lb (88.9 kg), SpO2 98 %. ?Body mass index is 31.64 kg/m?. ? ?General: Cooperative, alert, well developed, in no acute distress. ?HEENT: Conjunctivae and lids unremarkable. ?Cardiovascular: Regular rhythm.  ?Lungs: Normal work of breathing. ?Neurologic: No focal deficits.  ? ?Lab Results  ?Component Value Date  ? CREATININE 0.83 02/24/2021  ? BUN 15 02/24/2021  ? NA 138 02/24/2021  ? K 4.0 02/24/2021  ? CL 105 02/24/2021  ? CO2 27 02/24/2021  ? ?Lab Results  ?Component Value Date  ? ALT 10 02/24/2021  ? AST 11 02/24/2021  ? ALKPHOS 53 02/24/2021  ? BILITOT 0.4 02/24/2021  ? ?Lab Results  ?Component Value Date  ? HGBA1C 5.0 07/13/2020  ? HGBA1C 5.1 01/27/2020  ? HGBA1C 4.9 06/17/2019  ? HGBA1C 5.0 05/27/2018  ? HGBA1C 5.1 05/22/2017  ? ?Lab Results  ?Component Value Date  ?  INSULIN 7.6 07/13/2020  ? INSULIN 12.6 01/27/2020  ? INSULIN 9.1 06/17/2019  ? INSULIN 8.9 06/13/2018  ? ?Lab Results  ?Component Value Date  ? TSH 3.12 02/24/2021  ? ?Lab Results  ?Component Value Date  ? CHOL 140 07/13/2020  ? HDL 63 07/13/2020  ? [place] 65 07/13/2020  ? TRIG 53 07/13/2020  ? CHOLHDL 2.2 07/13/2020  ? ?Lab Results  ?Component Value Date  ? VD25OH 45.01 02/24/2021  ? VD25OH 52.3 07/13/2020  ? VD25OH 104.0 (H) 01/27/2020  ? ?Lab  Results  ?Component Value Date  ? WBC 10.0 02/08/2021  ? HGB 13.9 02/08/2021  ? HCT 42.0 02/08/2021  ? MCV 91.1 02/08/2021  ? PLT 239 02/08/2021  ? ?Lab Results  ?Component Value Date  ? IRON 131 02/08/2021  ? TIBC 485 (H) 02/08/2021  ? FERRITIN 26 02/08/2021  ? ?Attestation Statements:  ? ?Reviewed by clinician on day of visit: allergies, medications, problem list, medical history, surgical history, family history, social history, and previous encounter notes. ? ?I, Tonye Pearson, am acting as Location manager for [employer], PA-C. ? ?I have reviewed the above documentation for accuracy and completeness, and I agree with the above. Abby Potash, PA-C ? ?

## 2021-07-28 ENCOUNTER — Ambulatory Visit (INDEPENDENT_AMBULATORY_CARE_PROVIDER_SITE_OTHER): Payer: BC Managed Care – PPO | Admitting: Physician Assistant

## 2021-07-29 ENCOUNTER — Telehealth: Payer: Self-pay | Admitting: Genetic Counselor

## 2021-07-29 ENCOUNTER — Encounter: Payer: Self-pay | Admitting: Genetic Counselor

## 2021-07-29 DIAGNOSIS — Z1379 Encounter for other screening for genetic and chromosomal anomalies: Secondary | ICD-10-CM | POA: Insufficient documentation

## 2021-07-29 NOTE — Telephone Encounter (Signed)
I attempted to contact Ms. Matuska to discuss her genetic testing results (77 genes). I left a voicemail requesting she call me back at [phone]. ? ?Lucille Passy, MS, LCGC ?Genetic Counselor ?[email or web address] ?(P) [phone] ? ?

## 2021-08-02 ENCOUNTER — Telehealth: Payer: Self-pay | Admitting: Genetic Counselor

## 2021-08-02 ENCOUNTER — Other Ambulatory Visit: Payer: Self-pay

## 2021-08-02 ENCOUNTER — Ambulatory Visit: Payer: Self-pay

## 2021-08-02 ENCOUNTER — Ambulatory Visit: Payer: Self-pay | Admitting: Genetic Counselor

## 2021-08-02 DIAGNOSIS — Z1379 Encounter for other screening for genetic and chromosomal anomalies: Secondary | ICD-10-CM

## 2021-08-02 NOTE — Telephone Encounter (Signed)
I contacted Ms. Revere to discuss her genetic testing results. No pathogenic variants were identified in the 77 genes analyzed. Of note, a variant of uncertain significance was identified in the [place] gene. Detailed clinic note to follow. ? ?The test report has been scanned into EPIC and is located under the Molecular Pathology section of the Results Review tab.  A portion of the result report is included below for reference.  ? ?Lucille Passy, MS, [place] ?Genetic Counselor ?[email or web address] ?(P) [phone] ? ? ?

## 2021-08-02 NOTE — Telephone Encounter (Signed)
I attempted to contact [name] to discuss her genetic testing results (77 genes). I left a voicemail requesting she call me back at [phone]. ? ?Lucille Passy, MS, LCGC ?Genetic Counselor ?[email or web address] ?(P) [phone] ? ?

## 2021-08-02 NOTE — Progress Notes (Signed)
HPI:   ?Ms. Greulich was previously seen in the [place] clinic due to a family history of cancer and concerns regarding a hereditary predisposition to cancer. Please refer to our prior cancer genetics clinic note for more information regarding our discussion, assessment and recommendations, at the time. Ms. Riga recent genetic test results were disclosed to her, as were recommendations warranted by these results. These results and recommendations are discussed in more detail below. ? ?CANCER HISTORY:  ?Oncology History  ? No history exists.  ? ? ?FAMILY HISTORY:  ?We obtained a detailed, 4-generation family history.  Significant diagnoses are listed below: ?     ?Family History  ?Problem Relation Age of Onset  ? Cancer Mother    ?      SCC (x2) dx. 57 and again 6 months later  ? Other Sister    ?      tumors- unknown type  ? Cancer Maternal Aunt    ?      colorectal cancer  ?  ?  ?Ms. Zegers's mother was diagnosed with squamous cell carcinoma on her scalp at age 62, 6-7 months later she was diagnosed with SCC again and had radiation as part of her treatment. Ms. Ciocca sister had a benign tumor removed from her ribcage at age 50 and recently had a tumor on her arm and leg biopsied, she is awaiting the test results. Her maternal aunt was diagnosed with colorectal cancer at an unknown age.  ?  ?Ms. Keehan does not have any information about her paternal family medical history.  ?  ?Ms. Jimmerson is unaware of previous family history of genetic testing for hereditary cancer risks. There is no reported Ashkenazi Jewish ancestry. ? ?GENETIC TEST RESULTS:  ?The [facility] CancerNext-Expanded Panel found no pathogenic mutations.  ? ?The CancerNext-Expanded gene panel offered by [facility] and includes sequencing, rearrangement, and RNA analysis for the following 77 genes: AIP, ALK, APC, ATM, AXIN2, BAP1, BARD1, BLM, BMPR1A, BRCA1, BRCA2, BRIP1, CDC73, CDH1, CDK4, CDKN1B, CDKN2A, CHEK2, CTNNA1, DICER1, FANCC,  FH, FLCN, GALNT12, KIF1B, LZTR1, MAX, MEN1, MET, MLH1, MSH2, MSH3, MSH6, MUTYH, NBN, NF1, NF2, NTHL1, PALB2, PHOX2B, PMS2, POT1, PRKAR1A, PTCH1, PTEN, RAD51C, RAD51D, RB1, RECQL, RET, SDHA, SDHAF2, SDHB, SDHC, SDHD, SMAD4, SMARCA4, SMARCB1, SMARCE1, STK11, SUFU, TMEM127, TP53, TSC1, TSC2, VHL and XRCC2 (sequencing and deletion/duplication); EGFR, EGLN1, HOXB13, KIT, MITF, PDGFRA, POLD1, and POLE (sequencing only); EPCAM and GREM1 (deletion/duplication only).   ? ?The test report has been scanned into EPIC and is located under the Molecular Pathology section of the Results Review tab.  A portion of the result report is included below for reference. Genetic testing reported out on 07/26/2021.  ? ? ? ? ? ? ? 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  At this time, it is unknown if this variant is associated with an increased risk for cancer or if it is benign, but most uncertain variants are reclassified to benign. It should not be used to make medical management decisions. With time, we suspect the laboratory will determine the significance of this variant, if any. If the laboratory reclassifies this variant, we will attempt to contact Ms. Gage to discuss it further.  ? ?Even though a pathogenic variant was not identified, possible explanations for the cancer in the family may include: ?There may be no hereditary risk for cancer in the family. The cancers in her family may be due to other genetic or  environmental factors. ?There may be a gene mutation in one of these genes that current testing methods cannot detect, but that chance is small. ?There could be another gene that has not yet been discovered, or that we have not yet tested, that is responsible for the cancer diagnoses in the family.  ?It is also possible there is a hereditary cause for the cancer in the family that Ms. Maland did not inherit. ? ?Therefore, it is important to remain in touch with  cancer genetics in the future so that we can continue to offer Ms. Stitzer the most up to date genetic testing.  ? ?ADDITIONAL GENETIC TESTING:  ?We discussed with Ms. Trego that her genetic testing was fairly extensive.  If there are genes identified to increase cancer risk that can be analyzed in the future, we would be happy to discuss and coordinate this testing at that time.   ? ?CANCER SCREENING RECOMMENDATIONS:  ?Ms. Tarnow's test result is considered negative (normal).  This means that we have not identified a hereditary cause for her family history of cancer at this time.  ? ?An individual's cancer risk and medical management are not determined by genetic test results alone. Overall cancer risk assessment incorporates additional factors, including personal medical history, family history, and any available genetic information that may result in a personalized plan for cancer prevention and surveillance. Therefore, it is recommended she continue to follow the cancer management and screening guidelines provided by her primary healthcare provider. ? ?RECOMMENDATIONS FOR FAMILY MEMBERS:   ?Since she did not inherit a mutation in a cancer predisposition gene included on this panel, her son could not have inherited a mutation from her in one of these genes. ?We do not recommend familial testing for the FH variant of uncertain significance (VUS). ? ?FOLLOW-UP:  ?Cancer genetics is a rapidly advancing field and it is possible that new genetic tests will be appropriate for her and/or her family members in the future. We encouraged her to remain in contact with cancer genetics on an annual basis so we can update her personal and family histories and let her know of advances in cancer genetics that may benefit this family.  ? ?Our contact number was provided. Ms. Ehler questions were answered to her satisfaction, and she knows she is welcome to call us at anytime with additional questions or concerns.  ? ?Lucille Passy, MS, [place] ?Genetic Counselor ?[email or web address] ?(P) [phone] ? ? ?

## 2021-08-09 ENCOUNTER — Inpatient Hospital Stay: Payer: BC Managed Care – PPO | Attending: Genetic Counselor

## 2021-08-09 ENCOUNTER — Inpatient Hospital Stay: Payer: BC Managed Care – PPO | Admitting: Family

## 2021-08-11 ENCOUNTER — Encounter (INDEPENDENT_AMBULATORY_CARE_PROVIDER_SITE_OTHER): Payer: Self-pay | Admitting: Physician Assistant

## 2021-08-11 DIAGNOSIS — R7301 Impaired fasting glucose: Secondary | ICD-10-CM

## 2021-08-15 NOTE — Telephone Encounter (Signed)
LAST APPOINTMENT DATE: 07/07/21 ?NEXT APPOINTMENT DATE: 09/01/21 ? ? ?[place] [place] ?[address] ?[place] [address] ?Phone: [phone] Fax: [phone] ? ?[facility] ?[address] ?[place] [address] ?Phone: [phone] Fax: [phone] ? ?Patient is requesting a refill of the following medications: ?Requested Prescriptions  ? ?Pending Prescriptions Disp Refills  ? Semaglutide, 1 MG/DOSE, 4 MG/3ML SOPN 3 mL 0  ?  Sig: Inject 1 mg as directed once a week.  ? ? ?Date last filled: 07/07/21 ?Previously prescribed by Abby Potash ? ?Lab Results  ?Component Value Date  ? HGBA1C 5.0 07/13/2020  ? HGBA1C 5.1 01/27/2020  ? HGBA1C 4.9 06/17/2019  ? ?Lab Results  ?Component Value Date  ? [place] 65 07/13/2020  ? CREATININE 0.83 02/24/2021  ? ?Lab Results  ?Component Value Date  ? VD25OH 45.01 02/24/2021  ? VD25OH 52.3 07/13/2020  ? VD25OH 104.0 (H) 01/27/2020  ? ? ?BP Readings from Last 3 Encounters:  ?07/07/21 110/71  ?06/29/21 119/78  ?06/03/21 (!) 100/58  ? ? ?

## 2021-08-16 ENCOUNTER — Other Ambulatory Visit (HOSPITAL_COMMUNITY): Payer: Self-pay

## 2021-08-16 MED ORDER — SEMAGLUTIDE (1 MG/DOSE) 4 MG/3ML ~~LOC~~ SOPN
1.0000 mg | PEN_INJECTOR | SUBCUTANEOUS | 0 refills | Status: DC
  Filled 2021-08-16: qty 3, 28d supply, fill #0

## 2021-08-16 NOTE — Telephone Encounter (Signed)
Because it was our fault with the cancellation, go ahead and refill it this time. Thanks

## 2021-08-22 ENCOUNTER — Encounter: Payer: Self-pay | Admitting: Genetic Counselor

## 2021-08-31 ENCOUNTER — Ambulatory Visit (INDEPENDENT_AMBULATORY_CARE_PROVIDER_SITE_OTHER): Payer: BC Managed Care – PPO | Admitting: Physician Assistant

## 2021-09-01 ENCOUNTER — Ambulatory Visit (INDEPENDENT_AMBULATORY_CARE_PROVIDER_SITE_OTHER): Payer: BC Managed Care – PPO | Admitting: Physician Assistant

## 2021-09-01 ENCOUNTER — Encounter (INDEPENDENT_AMBULATORY_CARE_PROVIDER_SITE_OTHER): Payer: Self-pay | Admitting: Physician Assistant

## 2021-09-01 VITALS — BP 100/66 | HR 69 | Temp 97.6°F | Ht 66.0 in | Wt 186.0 lb

## 2021-09-01 DIAGNOSIS — E669 Obesity, unspecified: Secondary | ICD-10-CM | POA: Diagnosis not present

## 2021-09-01 DIAGNOSIS — R7301 Impaired fasting glucose: Secondary | ICD-10-CM | POA: Diagnosis not present

## 2021-09-01 DIAGNOSIS — Z6830 Body mass index (BMI) 30.0-30.9, adult: Secondary | ICD-10-CM

## 2021-09-01 MED ORDER — SEMAGLUTIDE (1 MG/DOSE) 4 MG/3ML ~~LOC~~ SOPN: 1.0000 mg | PEN_INJECTOR | SUBCUTANEOUS | 0 refills | Status: DC

## 2021-09-05 NOTE — Progress Notes (Signed)
? ? ? ?Chief Complaint:  ? ?OBESITY ?Tamara Ball is here to discuss her progress with her obesity treatment plan along with follow-up of her obesity related diagnoses. Rashmi is on the Category 3 Plan and keeping a food journal and adhering to recommended goals of 1200-1400 calories and 95 grams of protein and states she is following her eating plan approximately 85% of the time. Jalyric states she is doing HIIT for 40 minutes 3 times per week. ? ?Today's visit was #: 55 ?Starting weight: 205 lbs ?Starting date: 06/13/2020 ?Today's weight: 186 lbs ?Today's date: 09/01/2021 ?Total lbs lost to date: 19 lbs ?Total lbs lost since last in-office visit: 10 lbs ? ?Interim History: Estreya reports that Ozempic works well to control her appetite. When off of Ozempic, she notes a marked increased in sugar cravings and appetite. She is making good choices with her food, but has not been journaling due to traveling a lot. She is not getting enough protein. She is going to get back to meal prepping. ? ?Subjective:  ? ?1. Impaired fasting glucose ?Lorna is currently on Ozempic 1 mg. Her appetite and cravings are controlled while on it and uncontrolled when not taking it.  ? ?Assessment/Plan:  ? ?1. Impaired fasting glucose ?We will refill Ozempic 1 mg for 3 months with no refills. She will change to [facility] after that time and after insurance coverage.  ? ?- Semaglutide, 1 MG/DOSE, 4 MG/3ML SOPN; Inject 1 mg as directed once a week.  Dispense: 9 mL; Refill: 0 ? ?2. Obesity with current BMI of 30.04 ?Nattalie is currently in the action stage of change. As such, her goal is to continue with weight loss efforts. She has agreed to keeping a food journal and adhering to recommended goals of 1200-1400 calories and 95 grams of protein daily.  ? ?We discussed changing to [facility] as insurance most likely will not cover Ozempic moving forward.  ? ?Exercise goals:  As is.  ? ?Behavioral modification strategies: meal planning and cooking strategies and  planning for success. ? ?Jaci has agreed to follow-up with our clinic in 4 weeks. She was informed of the importance of frequent follow-up visits to maximize her success with intensive lifestyle modifications for her multiple health conditions.  ? ?Objective:  ? ?Blood pressure 100/66, pulse 69, temperature 97.6 ?F (36.4 ?C), height '5\' 6"'$  (1.676 m), weight 186 lb (84.4 kg), SpO2 99 %. ?Body mass index is 30.02 kg/m?. ? ?General: Cooperative, alert, well developed, in no acute distress. ?HEENT: Conjunctivae and lids unremarkable. ?Cardiovascular: Regular rhythm.  ?Lungs: Normal work of breathing. ?Neurologic: No focal deficits.  ? ?Lab Results  ?Component Value Date  ? CREATININE 0.83 02/24/2021  ? BUN 15 02/24/2021  ? NA 138 02/24/2021  ? K 4.0 02/24/2021  ? CL 105 02/24/2021  ? CO2 27 02/24/2021  ? ?Lab Results  ?Component Value Date  ? ALT 10 02/24/2021  ? AST 11 02/24/2021  ? ALKPHOS 53 02/24/2021  ? BILITOT 0.4 02/24/2021  ? ?Lab Results  ?Component Value Date  ? HGBA1C 5.0 07/13/2020  ? HGBA1C 5.1 01/27/2020  ? HGBA1C 4.9 06/17/2019  ? HGBA1C 5.0 05/27/2018  ? HGBA1C 5.1 05/22/2017  ? ?Lab Results  ?Component Value Date  ? INSULIN 7.6 07/13/2020  ? INSULIN 12.6 01/27/2020  ? INSULIN 9.1 06/17/2019  ? INSULIN 8.9 06/13/2018  ? ?Lab Results  ?Component Value Date  ? TSH 3.12 02/24/2021  ? ?Lab Results  ?Component Value Date  ? CHOL 140 07/13/2020  ?  HDL 63 07/13/2020  ? [place] 65 07/13/2020  ? TRIG 53 07/13/2020  ? CHOLHDL 2.2 07/13/2020  ? ?Lab Results  ?Component Value Date  ? VD25OH 45.01 02/24/2021  ? VD25OH 52.3 07/13/2020  ? VD25OH 104.0 (H) 01/27/2020  ? ?Lab Results  ?Component Value Date  ? WBC 10.0 02/08/2021  ? HGB 13.9 02/08/2021  ? HCT 42.0 02/08/2021  ? MCV 91.1 02/08/2021  ? PLT 239 02/08/2021  ? ?Lab Results  ?Component Value Date  ? IRON 131 02/08/2021  ? TIBC 485 (H) 02/08/2021  ? FERRITIN 26 02/08/2021  ? ?Attestation Statements:  ? ?Reviewed by clinician on day of visit: allergies,  medications, problem list, medical history, surgical history, family history, social history, and previous encounter notes. ? ?Time spent on visit including pre-visit chart review and post-visit care and charting was 30 minutes.  ? ?I, Tonye Pearson, am acting as Location manager for [employer], PA-C. ? ?I have reviewed the above documentation for accuracy and completeness, and I agree with the above. Abby Potash, PA-C ? ?

## 2021-09-10 ENCOUNTER — Encounter (INDEPENDENT_AMBULATORY_CARE_PROVIDER_SITE_OTHER): Payer: Self-pay | Admitting: Physician Assistant

## 2021-09-11 ENCOUNTER — Other Ambulatory Visit: Payer: Self-pay | Admitting: Family Medicine

## 2021-09-12 ENCOUNTER — Other Ambulatory Visit (INDEPENDENT_AMBULATORY_CARE_PROVIDER_SITE_OTHER): Payer: Self-pay

## 2021-09-12 ENCOUNTER — Other Ambulatory Visit (HOSPITAL_COMMUNITY): Payer: Self-pay

## 2021-09-12 DIAGNOSIS — R7301 Impaired fasting glucose: Secondary | ICD-10-CM

## 2021-09-12 MED ORDER — SEMAGLUTIDE (1 MG/DOSE) 4 MG/3ML ~~LOC~~ SOPN
1.0000 mg | PEN_INJECTOR | SUBCUTANEOUS | 0 refills | Status: DC
  Filled 2021-09-12: qty 9, 84d supply, fill #0
  Filled 2021-09-13: qty 3, 28d supply, fill #0

## 2021-09-13 ENCOUNTER — Other Ambulatory Visit (HOSPITAL_COMMUNITY): Payer: Self-pay

## 2021-09-14 ENCOUNTER — Other Ambulatory Visit (HOSPITAL_COMMUNITY): Payer: Self-pay

## 2021-09-14 ENCOUNTER — Other Ambulatory Visit (INDEPENDENT_AMBULATORY_CARE_PROVIDER_SITE_OTHER): Payer: Self-pay | Admitting: Family Medicine

## 2021-09-14 DIAGNOSIS — R7301 Impaired fasting glucose: Secondary | ICD-10-CM

## 2021-09-15 ENCOUNTER — Other Ambulatory Visit (HOSPITAL_COMMUNITY): Payer: Self-pay

## 2021-09-16 ENCOUNTER — Ambulatory Visit (INDEPENDENT_AMBULATORY_CARE_PROVIDER_SITE_OTHER): Payer: BC Managed Care – PPO

## 2021-09-16 ENCOUNTER — Ambulatory Visit: Payer: BC Managed Care – PPO | Admitting: Family Medicine

## 2021-09-16 VITALS — BP 102/70 | HR 78 | Temp 98.5°F | Wt 189.0 lb

## 2021-09-16 DIAGNOSIS — M5412 Radiculopathy, cervical region: Secondary | ICD-10-CM

## 2021-09-16 MED ORDER — METHYLPREDNISOLONE 4 MG PO TBPK: ORAL_TABLET | ORAL | 0 refills | Status: DC

## 2021-09-16 NOTE — Progress Notes (Signed)
? ?  Subjective:  ? ? Patient ID: Tamara Ball, female    DOB: 1981-01-14, 41 y.o.   MRN: [id number] ? ?HPI ?Here for 6 weeks of a sharp pain in the right neck that radiates down the right arm into the hand. Now for the past week she also has numbness down goes dow the right arm into the right hand. No weakness so far. No hx of trauma. 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 She had a massage that helped the pain only briefly. She has tried heat and BioFreeze with no relief. She has not taken any medications for pain because  she has chronic headaches and her head doctor has told her to avoid pain medications whenever possible. ? ? ?Review of Systems  ?Constitutional: Negative.   ?Respiratory: Negative.    ?Cardiovascular: Negative.   ?Musculoskeletal:  Positive for neck pain.  ?Neurological:  Positive for numbness. Negative for weakness.  ? ?   ?Objective:  ? Physical Exam ?Constitutional:   ?   General: She is not in acute distress. ?   Appearance: Normal appearance.  ?Cardiovascular:  ?   Rate and Rhythm: Normal rate and regular rhythm.  ?   Pulses: Normal pulses.  ?   Heart sounds: Normal heart sounds.  ?Pulmonary:  ?   Effort: Pulmonary effort is normal.  ?   Breath sounds: Normal breath sounds.  ?Musculoskeletal:  ?   Comments: She is very tender over the C5-C6 area of the spine, as well as the right upper back just superior to the scapula. The shoulders have full ROM. Her neck ROM is quite limited by the pain. Full grip strength in both hands.   ?Neurological:  ?   Mental Status: She is alert.  ? ? ? ? ? ?   ?Assessment & Plan:  ?Radicular neck pain. She will try a Medrol dose pack for relief. We will get cervical spine Xrays today.  ?Alysia Penna, MD ? ? ?

## 2021-09-21 NOTE — Addendum Note (Signed)
Addended by: Alysia Penna A on: 09/21/2021 07:59 AM ? ? Modules accepted: Orders ? ?

## 2021-09-29 ENCOUNTER — Encounter (INDEPENDENT_AMBULATORY_CARE_PROVIDER_SITE_OTHER): Payer: Self-pay | Admitting: Adult Health

## 2021-09-29 ENCOUNTER — Other Ambulatory Visit (HOSPITAL_COMMUNITY): Payer: Self-pay

## 2021-09-29 ENCOUNTER — Ambulatory Visit (INDEPENDENT_AMBULATORY_CARE_PROVIDER_SITE_OTHER): Payer: BC Managed Care – PPO | Admitting: Adult Health

## 2021-09-29 VITALS — BP 105/66 | HR 63 | Temp 97.2°F | Ht 66.0 in | Wt 183.0 lb

## 2021-09-29 DIAGNOSIS — E669 Obesity, unspecified: Secondary | ICD-10-CM | POA: Diagnosis not present

## 2021-09-29 DIAGNOSIS — R7301 Impaired fasting glucose: Secondary | ICD-10-CM

## 2021-09-29 DIAGNOSIS — Z6829 Body mass index (BMI) 29.0-29.9, adult: Secondary | ICD-10-CM | POA: Diagnosis not present

## 2021-09-29 MED ORDER — SEMAGLUTIDE (1 MG/DOSE) 4 MG/3ML ~~LOC~~ SOPN
1.0000 mg | PEN_INJECTOR | SUBCUTANEOUS | 0 refills | Status: DC
  Filled 2021-09-29 – 2021-10-13 (×2): qty 3, 28d supply, fill #0

## 2021-10-05 NOTE — Progress Notes (Signed)
Chief Complaint:   OBESITY Mckensi is here to discuss her progress with her obesity treatment plan along with follow-up of her obesity related diagnoses. Cyndie is on the Category 3 Plan and states she is following her eating plan approximately 80-85% of the time. [place] states she is doing HIIT training 30 minutes 3 times per week.  Today's visit was #: 41 Starting weight: 205 lbs Starting date: 06/13/2020 Today's weight: 183 lbs Today's date: 09/29/21 Total lbs lost to date: 22 lbs Total lbs lost since last in-office visit: -3  Interim History:  Started on Ozempic 0.25 mg once weekly end of January 2023. She has titrated to 1 mg Ozempic once weekly-injects on Saturday. Ms. Noyes reports that by Wednesday she experiences increased polyphagia.  Of note: Lost down to 173 pounds last year-on Saxenda 3 mg.   Unable to tolerate  max dose Saxenda-developed nausea and often poor appetite.  Subjective:   1. Impaired fasting glucose Started on Ozempic 0.25 mg once weekly end of January 2023. She has titrated to 1 mg Ozempic once weekly-injects on Saturday.  Ms. Prine reports that by Wednesday she experiences increased polyphagia. She denies mass in neck, dysphagia, dyspepsia, persistent hoarseness, abd pain, or N/V/Constipation.  Assessment/Plan:   1. Impaired fasting glucose Refill: - Semaglutide, 1 MG/DOSE, 4 MG/3ML SOPN; Inject 1 mg as directed once a week.  Dispense: 3 mL; Refill: 0  2. Obesity, current BMI 29.7 Halah is currently in the action stage of change. As such, her goal is to continue with weight loss efforts. She has agreed to keeping a food journal and adhering to recommended goals of 1200-1400 calories and 95 gms protein daily.   Exercise goals: as is.  Behavioral modification strategies: increasing lean protein intake, decreasing simple carbohydrates, meal planning and cooking strategies, keeping healthy foods in the home, and planning for success.  Catelynn has  agreed to follow-up with our clinic in 4 weeks. She was informed of the importance of frequent follow-up visits to maximize her success with intensive lifestyle modifications for her multiple health conditions.   Objective:   Blood pressure 105/66, pulse 63, temperature (!) 97.2 F (36.2 C), height '5\' 6"'$  (1.676 m), weight 183 lb (83 kg), SpO2 100 %. Body mass index is 29.54 kg/m.  General: Cooperative, alert, well developed, in no acute distress. HEENT: Conjunctivae and lids unremarkable. Cardiovascular: Regular rhythm.  Lungs: Normal work of breathing. Neurologic: No focal deficits.   Lab Results  Component Value Date   CREATININE 0.83 02/24/2021   BUN 15 02/24/2021   NA 138 02/24/2021   K 4.0 02/24/2021   CL 105 02/24/2021   CO2 27 02/24/2021   Lab Results  Component Value Date   ALT 10 02/24/2021   AST 11 02/24/2021   ALKPHOS 53 02/24/2021   BILITOT 0.4 02/24/2021   Lab Results  Component Value Date   HGBA1C 5.0 07/13/2020   HGBA1C 5.1 01/27/2020   HGBA1C 4.9 06/17/2019   HGBA1C 5.0 05/27/2018   HGBA1C 5.1 05/22/2017   Lab Results  Component Value Date   INSULIN 7.6 07/13/2020   INSULIN 12.6 01/27/2020   INSULIN 9.1 06/17/2019   INSULIN 8.9 06/13/2018   Lab Results  Component Value Date   TSH 3.12 02/24/2021   Lab Results  Component Value Date   CHOL 140 07/13/2020   HDL 63 07/13/2020   LDLCALC 65 07/13/2020   TRIG 53 07/13/2020   CHOLHDL 2.2 07/13/2020   Lab Results  Component  Value Date   VD25OH 45.01 02/24/2021   VD25OH 52.3 07/13/2020   VD25OH 104.0 (H) 01/27/2020   Lab Results  Component Value Date   WBC 10.0 02/08/2021   HGB 13.9 02/08/2021   HCT 42.0 02/08/2021   MCV 91.1 02/08/2021   PLT 239 02/08/2021   Lab Results  Component Value Date   IRON 131 02/08/2021   TIBC 485 (H) 02/08/2021   FERRITIN 26 02/08/2021    Attestation Statements:   Reviewed by clinician on day of visit: allergies, medications, problem list, medical  history, surgical history, family history, social history, and previous encounter notes.  I, Georgianne Fick, FNP, am acting as Location manager for Mina Marble, NP.  I have reviewed the above documentation for accuracy and completeness, and I agree with the above. -  Elodia Haviland d. Darthula Desa, NP-C

## 2021-10-10 ENCOUNTER — Ambulatory Visit: Payer: BC Managed Care – PPO | Attending: Family Medicine | Admitting: Physical Therapy

## 2021-10-10 ENCOUNTER — Encounter: Payer: Self-pay | Admitting: Physical Therapy

## 2021-10-10 DIAGNOSIS — M5412 Radiculopathy, cervical region: Secondary | ICD-10-CM | POA: Diagnosis present

## 2021-10-10 DIAGNOSIS — R293 Abnormal posture: Secondary | ICD-10-CM | POA: Diagnosis present

## 2021-10-10 DIAGNOSIS — R208 Other disturbances of skin sensation: Secondary | ICD-10-CM | POA: Diagnosis present

## 2021-10-10 DIAGNOSIS — M6281 Muscle weakness (generalized): Secondary | ICD-10-CM | POA: Insufficient documentation

## 2021-10-10 NOTE — Therapy (Signed)
OUTPATIENT PHYSICAL THERAPY CERVICAL EVALUATION   Patient Name: Tamara Ball MRN: [id number] DOB:Feb 01, 1981, 41 y.o., female Today's Date: 10/10/2021   PT End of Session - 10/10/21 0844     Visit Number 1    Number of Visits 13    Date for PT Re-Evaluation 11/21/21    Authorization Type [employer]    Authorization Time Period 10/10/21 to 11/21/21    PT Start Time 0802    PT Stop Time 0843    PT Time Calculation (min) 41 min    Activity Tolerance Patient tolerated treatment well    Behavior During Therapy WFL for tasks assessed/performed             Past Medical History:  Diagnosis Date   Anemia    Anxiety    Chicken pox    Chronic headaches    Constipation    Fibroids    Iron deficiency anemia due to chronic blood loss    from heavy periods   Menorrhagia    Palpitations    History reviewed. No pertinent surgical history. Patient Active Problem List   Diagnosis Date Noted   Impaired fasting glucose 09/29/2021   Genetic testing 07/29/2021   Pneumonia due to COVID-19 virus 06/02/2020   Cough 12/02/2019   Chest congestion 12/02/2019   Migraine 09/19/2019   Class 1 obesity with serious comorbidity and body mass index (BMI) of 33.0 to 33.9 in adult 06/17/2018   Menorrhagia    Iron deficiency anemia due to chronic blood loss    Allergic rhinitis 02/05/2014    PCP: Micheline Rough   REFERRING PROVIDER: Laurey Morale, MD   REFERRING DIAG: [phone] (ICD-10-CM) - Cervical radiculopathy, acute   THERAPY DIAG:  Radiculopathy, cervical region - Plan: PT plan of care cert/re-cert  Abnormal posture - Plan: PT plan of care cert/re-cert  Muscle weakness (generalized) - Plan: PT plan of care cert/re-cert  Other disturbances of skin sensation - Plan: PT plan of care cert/re-cert  Rationale for Evaluation and Treatment Rehabilitation  ONSET DATE: 09/21/2021   SUBJECTIVE:                                                                                                                                                                                                          SUBJECTIVE STATEMENT: Since the end of March I've been having issues with my upper shoulder back bone area, about a month ago I started having numbness from my shoulder to my arms and I got an xray which didn't show too much. It doesn't stop me  from doing anything its just annoying. Not sure what makes it better or worse.   PERTINENT HISTORY:  Migraines, hx of Covid  PAIN:  Are you having pain? Yes: NPRS scale: 3/10 Pain location: R shoulder girdle  Pain description: numbness/tingling  Aggravating factors: unsure  Relieving factors: unsure   PRECAUTIONS: None  WEIGHT BEARING RESTRICTIONS No  FALLS:  Has patient fallen in last 6 months? No  LIVING ENVIRONMENT: Lives with: lives alone Lives in: House/apartment Stairs: No Has following equipment at home: None  OCCUPATION: works in higher education   PLOF: Independent, Independent with basic ADLs, Independent with gait, and Independent with transfers  PATIENT GOALS get rid of pain   OBJECTIVE:   DIAGNOSTIC FINDINGS:  IMPRESSION: 1. No acute bony abnormality. 2. Early degenerative changes at C5-C6  PATIENT SURVEYS:  FOTO 79.2   COGNITION: Overall cognitive status: Within functional limits for tasks assessed   SENSATION: Light touch: Impaired  can identify regions correctly in LTT test but just feels different from the L  POSTURE: rounded shoulders, forward head, decreased lumbar lordosis, and decreased thoracic kyphosis  PALPATION: Some mm tightness in upper traps, rhomboids, anterior delt, pecs but not TTP   CERVICAL ROM:   Active ROM A/PROM (deg) eval  Flexion WNL   Extension WNL   Right lateral flexion 50% limited   Left lateral flexion WNL  Right rotation 20% limited  Left rotation WNL    (Blank rows = not tested)  THORACIC ROM  Flexion WNL, extension mild limitation, lateral flexion WNL B, rotation  WNL B     UPPER EXTREMITY ROM:  Active ROM Right eval Left eval  Shoulder flexion WNL  WNL   Shoulder extension    Shoulder abduction WNL  WNL   Shoulder adduction    Shoulder extension    Shoulder internal rotation FIR T6 FIR T7  Shoulder external rotation FER T5  FER T5   Elbow flexion    Elbow extension    Wrist flexion    Wrist extension    Wrist ulnar deviation    Wrist radial deviation    Wrist pronation    Wrist supination     (Blank rows = not tested)  UPPER EXTREMITY MMT:  MMT Right eval Left eval  Shoulder flexion 4/5  4/5   Shoulder extension    Shoulder abduction 4/5  4/5  Shoulder adduction    Shoulder extension    Shoulder internal rotation 4/5 4/5  Shoulder external rotation 4/5 4/5  Middle trapezius    Lower trapezius    Elbow flexion    Elbow extension    Wrist flexion    Wrist extension    Wrist ulnar deviation    Wrist radial deviation    Wrist pronation    Wrist supination    Grip strength     (Blank rows = not tested)  CERVICAL SPECIAL TESTS:  Spurling's test: Positive and Distraction test: Positive     TODAY'S TREATMENT:   UBE L2 3 min forward/3 min backward  Cervical retractions 1x10 3 second holds  Upper trap stretch 2x30 seconds B  Upper trap stretch 1x30 seconds R Scalene stretch 1x30 seconds R Scap retraction 1x10 3 second holds  Manual cervical traction 5x15 seconds   PATIENT EDUCATION:  Education details: exam findings, POC, HEP  Person educated: Patient Education method: Consulting civil engineer, Media planner, and Handouts Education comprehension: verbalized understanding and returned demonstration   HOME EXERCISE PROGRAM: [id number]  ASSESSMENT:  CLINICAL IMPRESSION: Patient is a  41 y.o. female who was seen today for physical therapy evaluation and treatment for radicular cervical pain. Exam shows postural impairment, mild functional mm weakness and stiffness, and definite cervical nerve entrapment- responded well to  manual traction today. Will benefit from skilled PT services to address symptoms and optimize level of function moving forward.   OBJECTIVE IMPAIRMENTS decreased ROM, decreased strength, hypomobility, increased muscle spasms, impaired flexibility, impaired sensation, impaired UE functional use, postural dysfunction, and pain.   ACTIVITY LIMITATIONS carrying, lifting, sleeping, reach over head, and hygiene/grooming  PARTICIPATION LIMITATIONS: meal prep, cleaning, laundry, driving, community activity, occupation, and yard work  PERSONAL FACTORS Time since onset of injury/illness/exacerbation are also affecting patient's functional outcome.   REHAB POTENTIAL: Excellent  CLINICAL DECISION MAKING: Stable/uncomplicated  EVALUATION COMPLEXITY: Low   GOALS: Goals reviewed with patient? No  SHORT TERM GOALS: Target date: 10/31/2021   Will be independent with HEP  Baseline: initiated at eval  Goal status: INITIAL  2.  Will have better understanding postural mechanics and ergonomics  Baseline: poor posture  Goal status: INITIAL  3.  Intensity of radicular symptoms to be no more than 4-5/10 Baseline: 7-8/10 Goal status: INITIAL     LONG TERM GOALS: Target date: 11/21/2021  MMT to improve by 1 grade globally  Baseline: see objective  Goal status: INITIAL  2.  Intensity of radicular symptoms to be no more than 2/10 Baseline: 7-8/10 Goal status: INITIAL  3.  FOTO score to have improved by 10 points to show improvement in function/QOL  Baseline: 79 Goal status: INITIAL    PLAN: PT FREQUENCY: 1-2x/week  PT DURATION: 6 weeks  PLANNED INTERVENTIONS: Therapeutic exercises, Therapeutic activity, Neuromuscular re-education, Balance training, Gait training, Patient/Family education, Joint mobilization, Dry Needling, Electrical stimulation, Spinal mobilization, Cryotherapy, Moist heat, Taping, Traction, Ultrasound, Ionotophoresis '4mg'$ /ml Dexamethasone, Manual therapy, and  Re-evaluation  PLAN FOR NEXT SESSION: postural strengthening and machine vs manual cervical traction    Wynema Garoutte U PT DPT PN2  10/10/2021, 8:49 AM

## 2021-10-13 ENCOUNTER — Other Ambulatory Visit (HOSPITAL_COMMUNITY): Payer: Self-pay

## 2021-10-17 ENCOUNTER — Ambulatory Visit: Payer: BC Managed Care – PPO | Admitting: Physical Therapy

## 2021-10-27 ENCOUNTER — Ambulatory Visit (INDEPENDENT_AMBULATORY_CARE_PROVIDER_SITE_OTHER): Payer: BC Managed Care – PPO | Admitting: Adult Health

## 2021-10-28 ENCOUNTER — Ambulatory Visit: Payer: BC Managed Care – PPO | Admitting: Physical Therapy

## 2021-10-28 ENCOUNTER — Ambulatory Visit: Payer: BC Managed Care – PPO | Admitting: Neurology

## 2021-11-17 ENCOUNTER — Encounter (INDEPENDENT_AMBULATORY_CARE_PROVIDER_SITE_OTHER): Payer: Self-pay | Admitting: Nurse Practitioner

## 2021-11-17 ENCOUNTER — Other Ambulatory Visit (HOSPITAL_COMMUNITY): Payer: Self-pay

## 2021-11-17 ENCOUNTER — Ambulatory Visit (INDEPENDENT_AMBULATORY_CARE_PROVIDER_SITE_OTHER): Payer: BC Managed Care – PPO | Admitting: Nurse Practitioner

## 2021-11-17 VITALS — BP 106/63 | HR 84 | Temp 98.1°F | Ht 66.0 in | Wt 174.0 lb

## 2021-11-17 DIAGNOSIS — E66811 Obesity, class 1: Secondary | ICD-10-CM

## 2021-11-17 DIAGNOSIS — Z6828 Body mass index (BMI) 28.0-28.9, adult: Secondary | ICD-10-CM | POA: Diagnosis not present

## 2021-11-17 DIAGNOSIS — R7301 Impaired fasting glucose: Secondary | ICD-10-CM | POA: Diagnosis not present

## 2021-11-17 DIAGNOSIS — E669 Obesity, unspecified: Secondary | ICD-10-CM | POA: Diagnosis not present

## 2021-11-17 MED ORDER — SEMAGLUTIDE (1 MG/DOSE) 4 MG/3ML ~~LOC~~ SOPN
1.0000 mg | PEN_INJECTOR | SUBCUTANEOUS | 0 refills | Status: DC
  Filled 2021-11-17 – 2021-12-05 (×2): qty 3, 28d supply, fill #0

## 2021-11-18 ENCOUNTER — Other Ambulatory Visit (HOSPITAL_COMMUNITY): Payer: Self-pay

## 2021-11-21 NOTE — Progress Notes (Signed)
Chief Complaint:   OBESITY Tamara Ball is here to discuss her progress with her obesity treatment plan along with follow-up of her obesity related diagnoses. [name] is on the Category 3 Plan and states she is following her eating plan approximately 85% of the time. [name] states she is High intensity Training 45 minutes 3-4 times per week.  Today's visit was #: 49 Starting weight: 205 lbs Starting date: 06/13/2020 Today's weight: 174 lbs Today's date: 11/17/2021 Total lbs lost to date: 31 lbs Total lbs lost since last in-office visit: 9  Interim History: Tamara Ball has done well with weight loss overall. Now at goal weight. Her highest weight was 215 lbs. Does not feel she is eating enough protein. Eating 1-3 meals per day. She travels frequently. She went on vacation since her last visit. Does not have control over food she gets when traveling for work. She is drinking water, protein shakes, diet soda and/or unsweetened tea.  Subjective:   1. Impaired fasting glucose Shauntee is taking Ozempic 1 mg. Denies any side effects. Denies polyphagia or cravings.  Assessment/Plan:   1. Impaired fasting glucose We will refill Ozempic 1 mg SubQ once weekly for 1 month with 0 refills. Side effects discussed.   -Refill Semaglutide, 1 MG/DOSE, 4 MG/3ML SOPN; Inject 1 mg into the skin once a week.  Dispense: 3 mL; Refill: 0  2. Obesity, current BMI 28.1 Tamara Ball is currently in the action stage of change. As such, her goal is to continue with weight loss efforts. She has agreed to the Category 3 Plan.   We will obtain labs at next visit.  Exercise goals: As is.  Behavioral modification strategies: increasing lean protein intake, increasing water intake, and planning for success.  [name] has agreed to follow-up with our clinic in 4 weeks. She was informed of the importance of frequent follow-up visits to maximize her success with intensive lifestyle modifications for her multiple health conditions.    Objective:   Blood pressure 106/63, pulse 84, temperature 98.1 F (36.7 C), height '5\' 6"'$  (1.676 m), weight 174 lb (78.9 kg), SpO2 100 %. Body mass index is 28.08 kg/m.  General: Cooperative, alert, well developed, in no acute distress. HEENT: Conjunctivae and lids unremarkable. Cardiovascular: Regular rhythm.  Lungs: Normal work of breathing. Neurologic: No focal deficits.   Lab Results  Component Value Date   CREATININE 0.83 02/24/2021   BUN 15 02/24/2021   NA 138 02/24/2021   K 4.0 02/24/2021   CL 105 02/24/2021   CO2 27 02/24/2021   Lab Results  Component Value Date   ALT 10 02/24/2021   AST 11 02/24/2021   ALKPHOS 53 02/24/2021   BILITOT 0.4 02/24/2021   Lab Results  Component Value Date   HGBA1C 5.0 07/13/2020   HGBA1C 5.1 01/27/2020   HGBA1C 4.9 06/17/2019   HGBA1C 5.0 05/27/2018   HGBA1C 5.1 05/22/2017   Lab Results  Component Value Date   INSULIN 7.6 07/13/2020   INSULIN 12.6 01/27/2020   INSULIN 9.1 06/17/2019   INSULIN 8.9 06/13/2018   Lab Results  Component Value Date   TSH 3.12 02/24/2021   Lab Results  Component Value Date   CHOL 140 07/13/2020   HDL 63 07/13/2020   LDLCALC 65 07/13/2020   TRIG 53 07/13/2020   CHOLHDL 2.2 07/13/2020   Lab Results  Component Value Date   VD25OH 45.01 02/24/2021   VD25OH 52.3 07/13/2020   VD25OH 104.0 (H) 01/27/2020   Lab Results  Component  Value Date   WBC 10.0 02/08/2021   HGB 13.9 02/08/2021   HCT 42.0 02/08/2021   MCV 91.1 02/08/2021   PLT 239 02/08/2021   Lab Results  Component Value Date   IRON 131 02/08/2021   TIBC 485 (H) 02/08/2021   FERRITIN 26 02/08/2021   Attestation Statements:   Reviewed by clinician on day of visit: allergies, medications, problem list, medical history, surgical history, family history, social history, and previous encounter notes.  I, Brendell Tyus, RMA , am acting as transcriptionist for Everardo Pacific, FNP.  I have reviewed the above documentation  for accuracy and completeness, and I agree with the above. Everardo Pacific, FNP

## 2021-11-28 ENCOUNTER — Other Ambulatory Visit (HOSPITAL_COMMUNITY): Payer: Self-pay

## 2021-12-05 ENCOUNTER — Other Ambulatory Visit (HOSPITAL_COMMUNITY): Payer: Self-pay

## 2021-12-07 IMAGING — CT CT HEAD CODE STROKE
3 series · 14 of 47 positions shown, 16 images · non-contrast
Comparison: Radiographic sinus series 06/21/2015.

CLINICAL DATA: Code stroke. 39-year-old female with left arm and
facial numbness upon waking. Headache.

EXAM:
CT HEAD WITHOUT CONTRAST
TECHNIQUE: Contiguous axial images were obtained from the base of the skull
through the vertex without intravenous contrast.

[Series 3: head wo · axial · 0.44mm/px · z∈[+832,+958]mm · 8 of 31 slices shown, 10 images]
[im 3/31  brain]
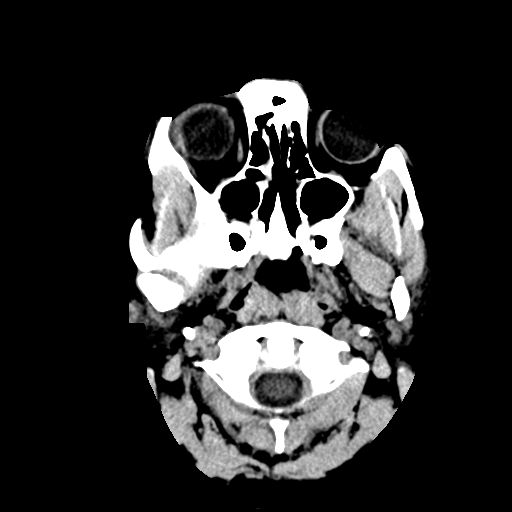
[im 3/31  bone]
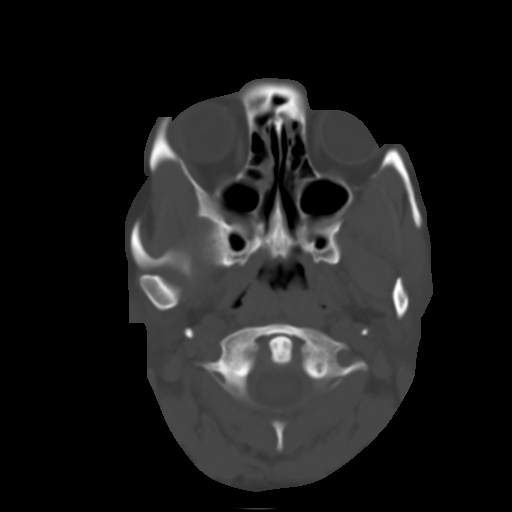
[im 7/31  brain]
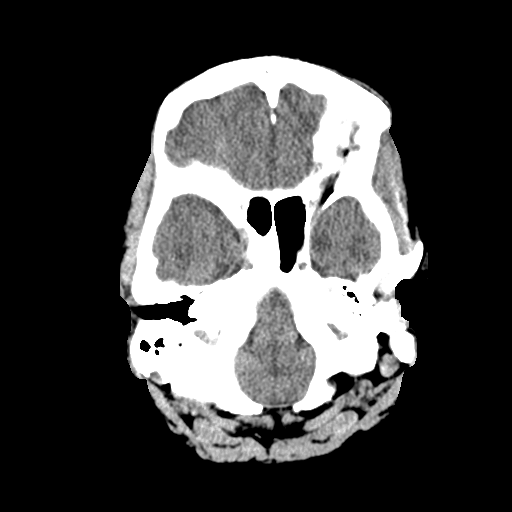
[im 10/31  brain]
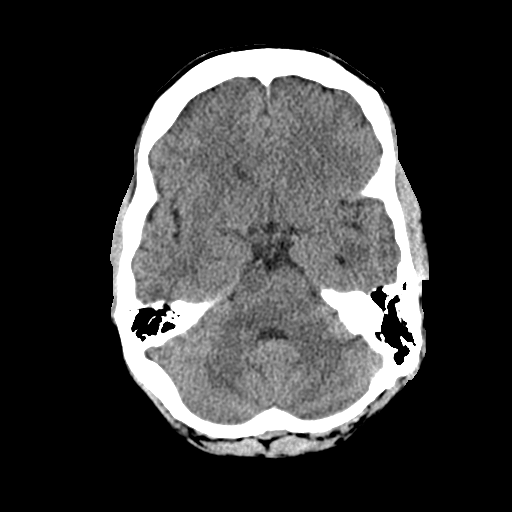
[im 14/31  brain]
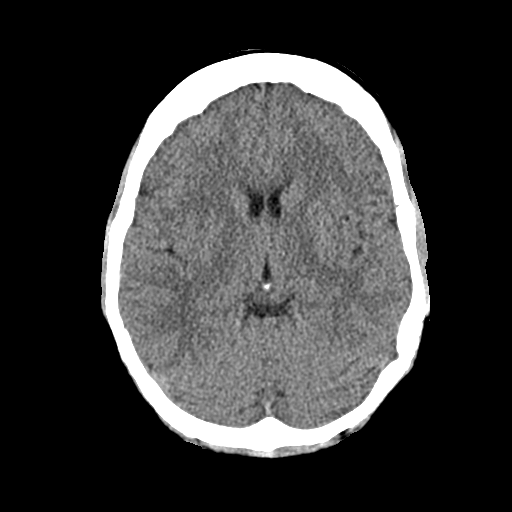
[im 17/31  brain]
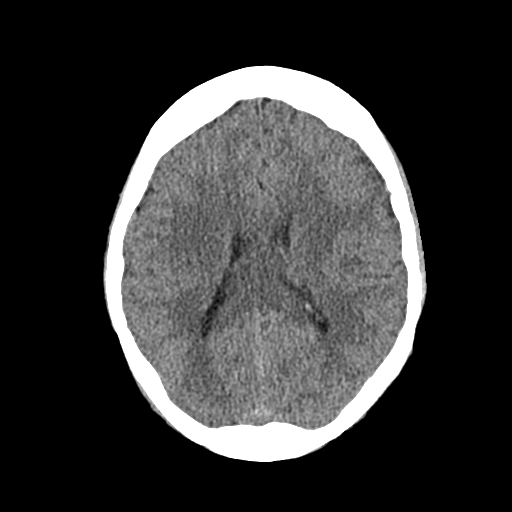
[im 17/31  bone]
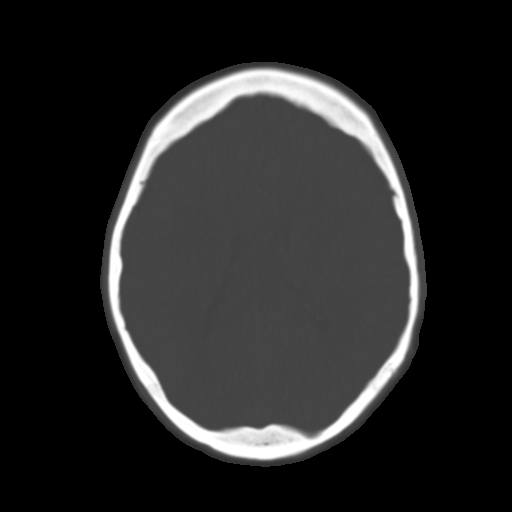
[im 21/31  brain]
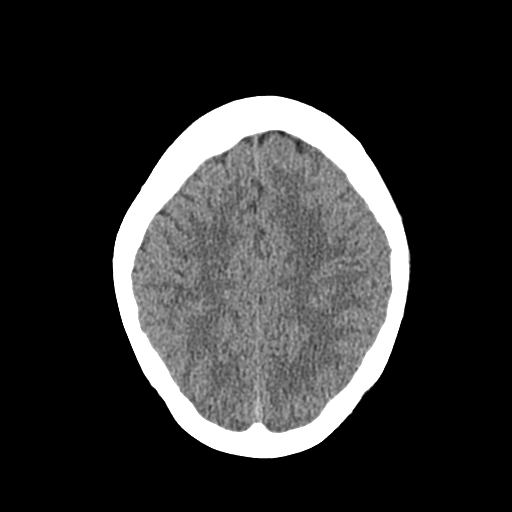
[im 24/31  brain]
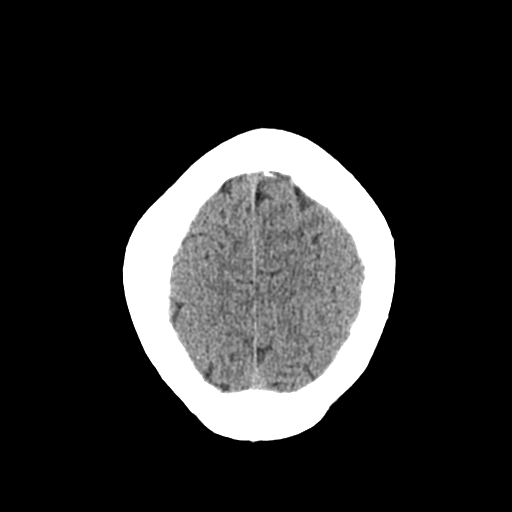
[im 28/31  brain]
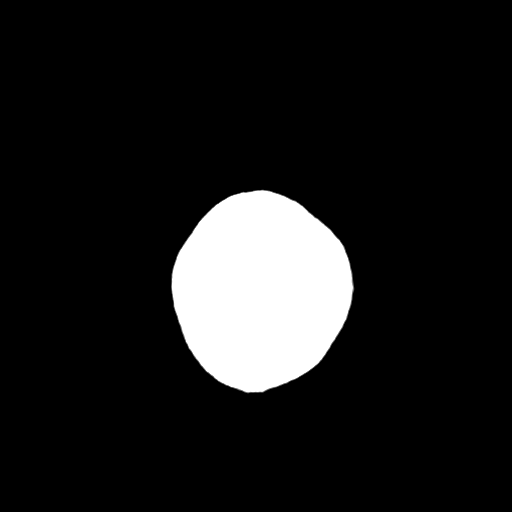

[Series 5: cor soft · coronal · 0.30mm/px · 3 of 68 slices shown]
[im 23/68  brain]
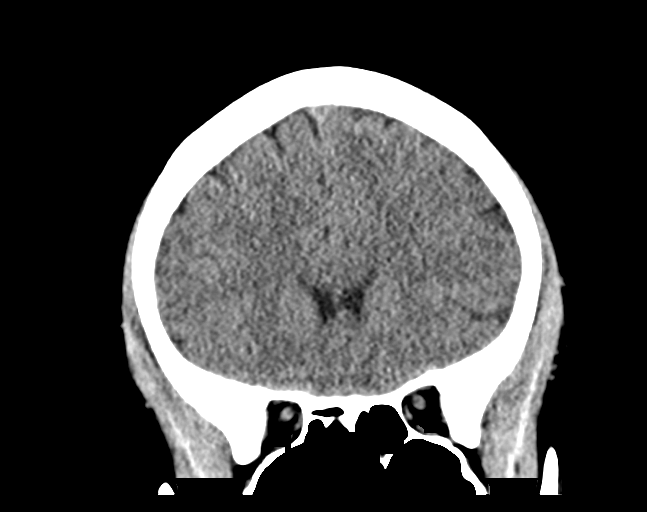
[im 30/68  brain]
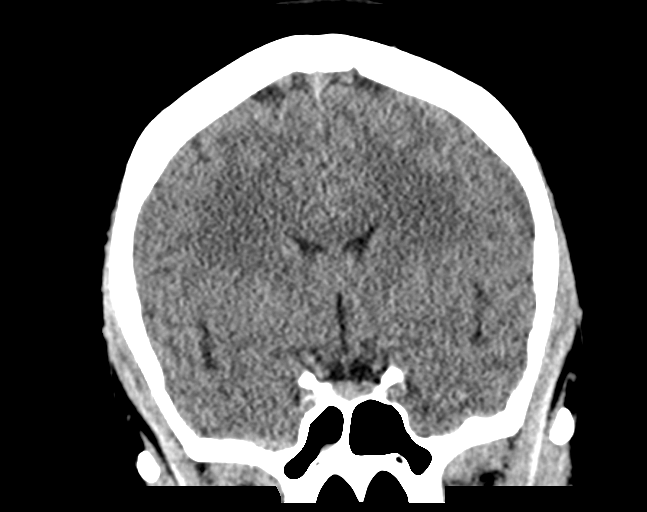
[im 38/68  brain]
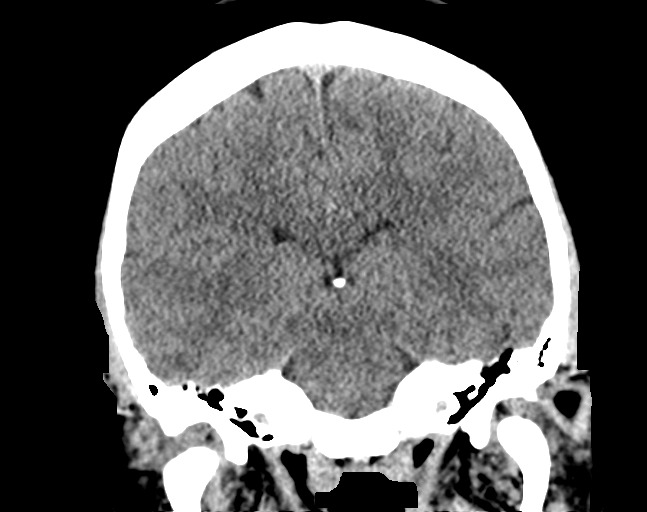

[Series 6: sag soft · sagittal · 0.30mm/px · 3 of 58 slices shown]
[im 20/58  brain]
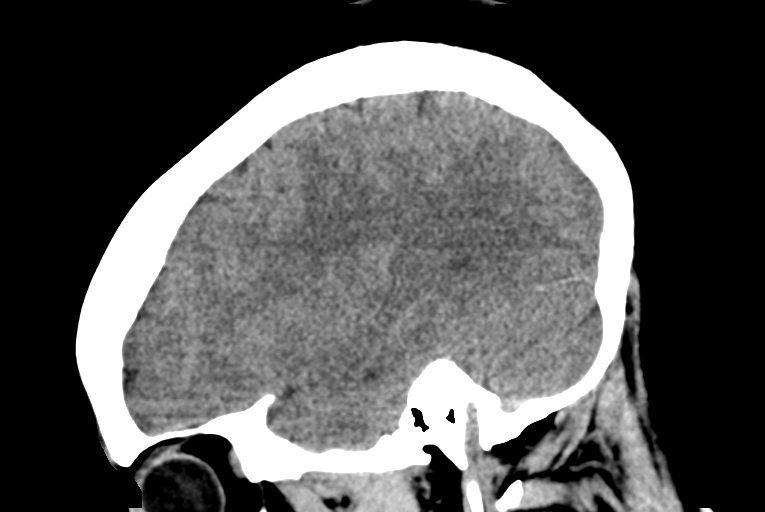
[im 29/58  brain]
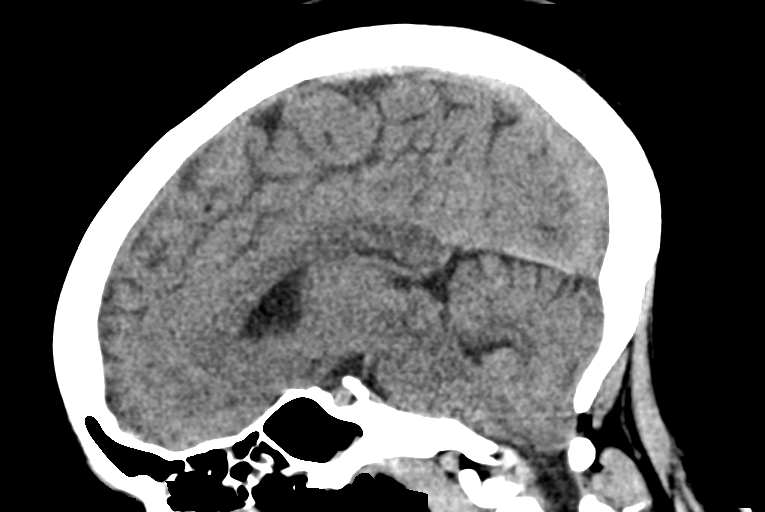
[im 39/58  brain]
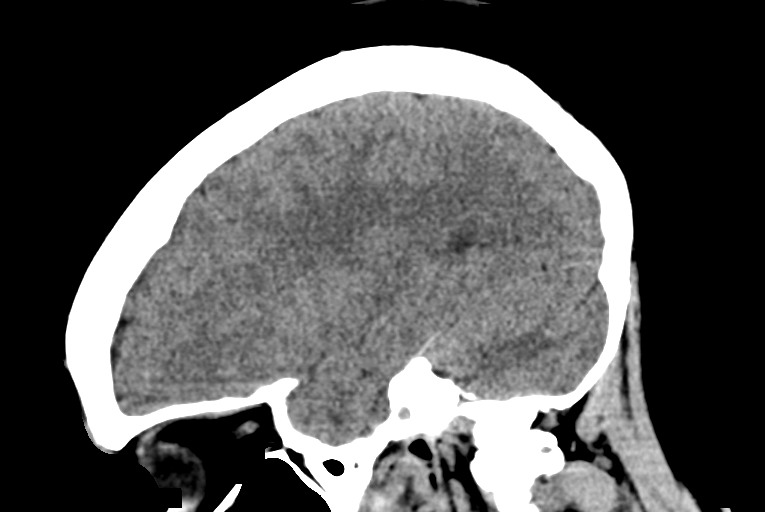

[14 of 47 positions shown; findings below may reference images not displayed]

FINDINGS: Brain: Mild cerebellar tonsillar ectopia and crowding of the
cisterna magna (sagittal image 32). Other basilar cisterns appear
normal. Normal pituitary size and configuration. No midline shift,
ventriculomegaly, mass effect, evidence of mass lesion, intracranial
hemorrhage or evidence of cortically based acute infarction.
Gray-white matter differentiation is within normal limits throughout
the brain. No encephalomalacia identified.

Vascular: No suspicious intracranial vascular hyperdensity.

Skull: Negative.

Sinuses/Orbits: Mild bilateral paranasal sinus mucosal thickening,
most pronounced in the ethmoids. Tympanic cavities and mastoids
appear clear.

Other: Visualized orbits and scalp soft tissues are within normal
limits.

ASPECTS (Alberta Stroke Program Early CT Score)

Total score (0-10 with 10 being normal): 10
IMPRESSION: 1. No acute cortically based infarct or intracranial hemorrhage
identified. ASPECTS 10.

2. Negative noncontrast CT appearance of the brain aside from mild
cerebellar tonsillar ectopia and crowding of the cisterna magna.
This might be a normal anatomic variant, but can also be associated
with Chiari 1 malformation, and abnormal intracranial pressure both
intracranial hypo-and hypertension (pseudotumor cerebri).

3. Mild paranasal sinus inflammation.

Study discussed by telephone with Dr. GUICELA JANETH DEMERA on 06/20/2020 at

## 2021-12-07 IMAGING — MR MR HEAD W/O CM
12 of 13 series · 44 of 48 positions shown · non-contrast
Comparison: Head CT [HOSPITAL] [HOSPITAL] 3228 hours.

CLINICAL DATA: 39-year-old female code stroke presentation this
morning. Left arm and facial numbness, headache.

EXAM:
MRI HEAD WITHOUT CONTRAST
TECHNIQUE: Multiplanar, multiecho pulse sequences of the brain and surrounding
structures were obtained without intravenous contrast.

[Series 5: DWI · axial · 3.0mm · 0.88mm/px · z∈[-129,+6]mm · 8 of 96 slices shown (1 of 4)]
[im 1/96]
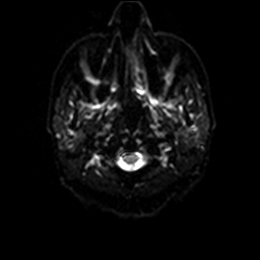
[im 14/96]
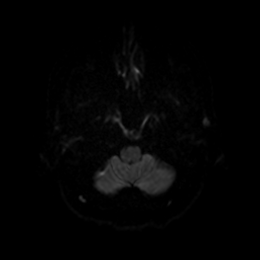
[im 28/96]
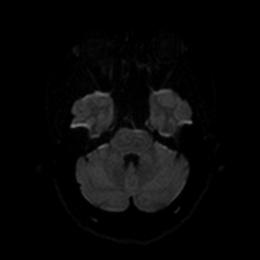
[im 41/96]
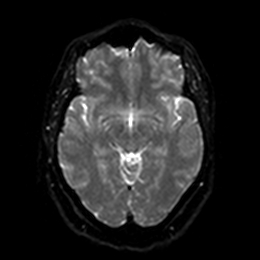
[im 55/96]
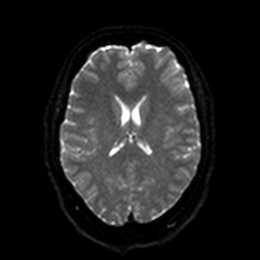
[im 68/96]
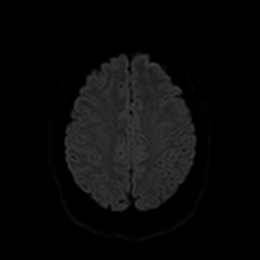
[im 82/96]
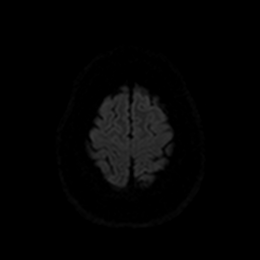
[im 96/96]
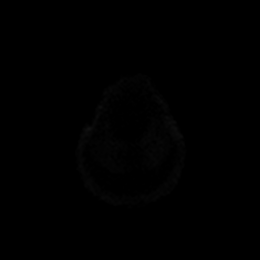

[Series 6: DWI · axial · 3.0mm · 0.88mm/px · z∈[-129,+6]mm · 4 of 48 slices shown (2 of 4)]
[im 1/48]
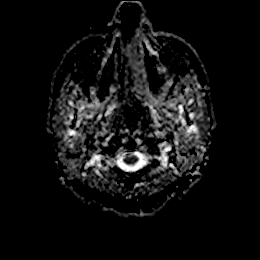
[im 16/48]
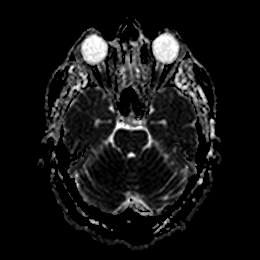
[im 32/48]
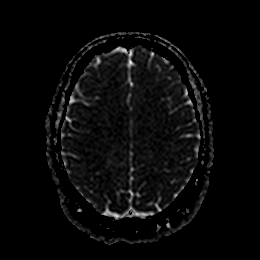
[im 48/48]
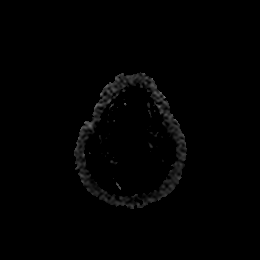

[Series 7: T1 · sagittal · 5.0mm · 0.75mm/px · 2 of 25 slices shown]
[im 1/25]
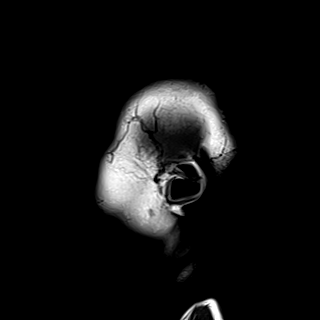
[im 25/25]
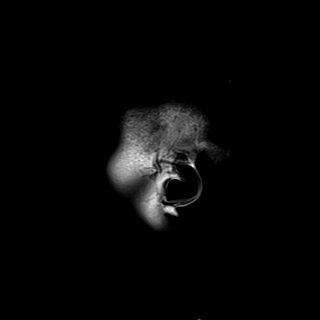

[Series 8: DWI · coronal · 4.0mm · 0.88mm/px · 5 of 68 slices shown (3 of 4)]
[im 1/68]
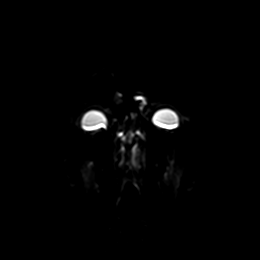
[im 17/68]
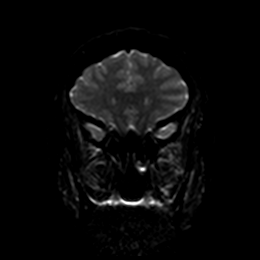
[im 34/68]
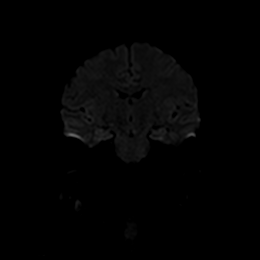
[im 51/68]
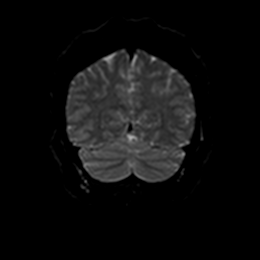
[im 68/68]
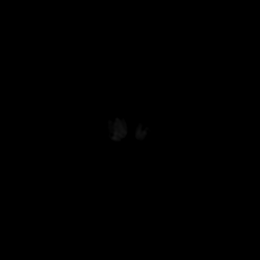

[Series 9: DWI · coronal · 4.0mm · 0.88mm/px · 3 of 34 slices shown (4 of 4)]
[im 1/34]
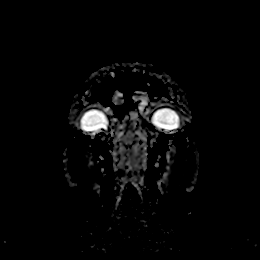
[im 17/34]
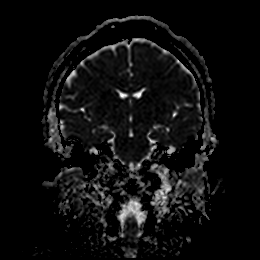
[im 34/34]
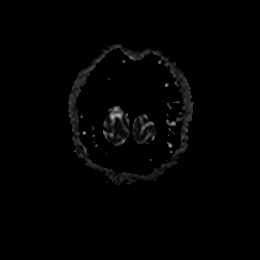

[Series 10: T2 · axial · 5.0mm · 0.72mm/px · z∈[-127,+11]mm · 2 of 25 slices shown (1 of 2)]
[im 1/25]
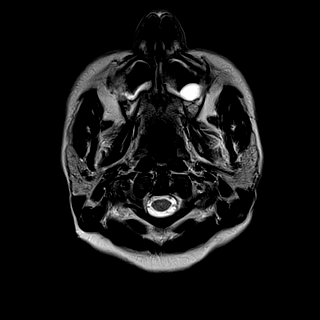
[im 25/25]
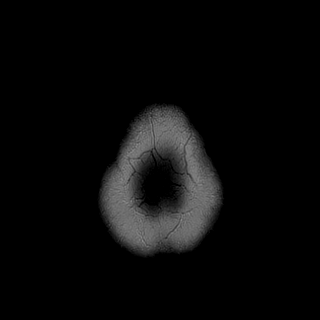

[Series 11: FLAIR · axial · 5.0mm · 0.45mm/px · z∈[-126,+12]mm · 2 of 25 slices shown]
[im 1/25]
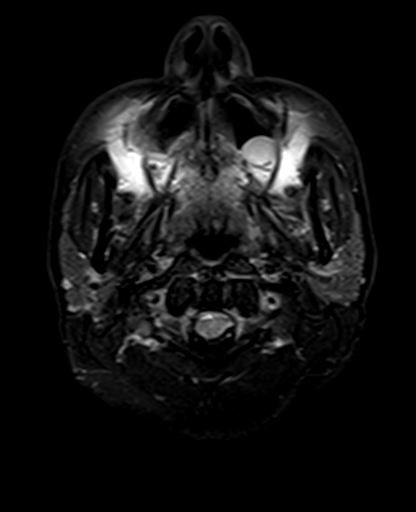
[im 25/25]
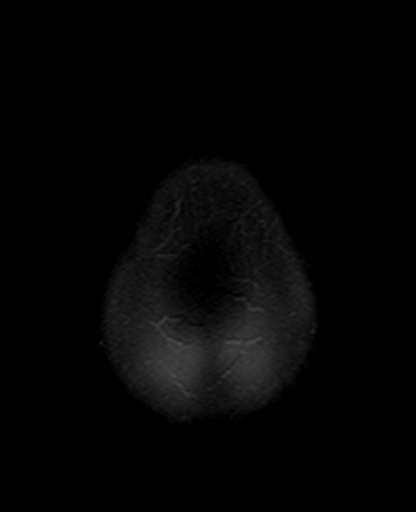

[Series 12: mag_images · axial · 3.0mm · 0.90mm/px · z∈[-136,+22]mm · 4 of 56 slices shown]
[im 1/56]
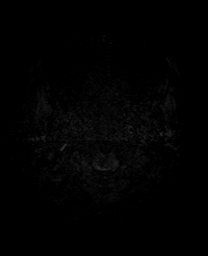
[im 19/56]
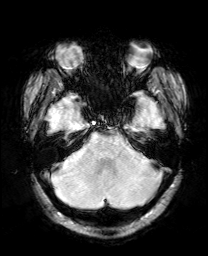
[im 37/56]
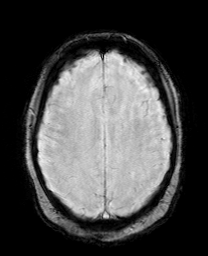
[im 56/56]
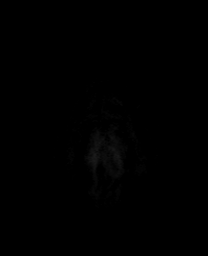

[Series 13: pha_images · axial · 3.0mm · 0.90mm/px · z∈[-136,+22]mm · 4 of 56 slices shown]
[im 1/56]
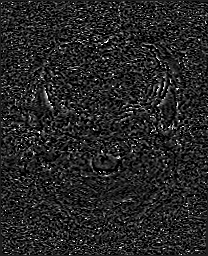
[im 19/56]
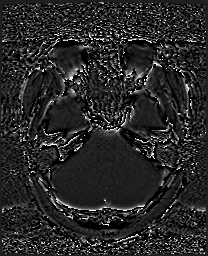
[im 37/56]
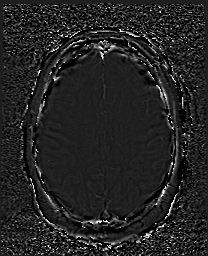
[im 56/56]
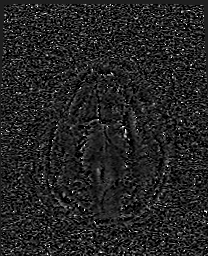

[Series 14: swi_images · axial · 3.0mm · 0.90mm/px · z∈[-136,+22]mm · 4 of 56 slices shown]
[im 1/56]
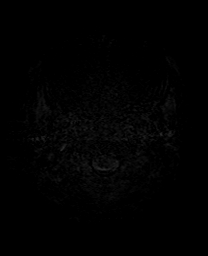
[im 19/56]
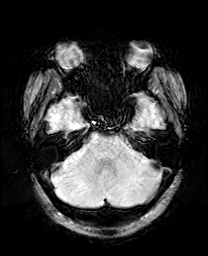
[im 37/56]
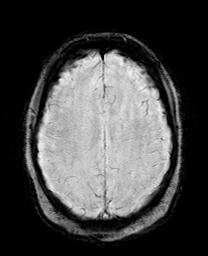
[im 56/56]
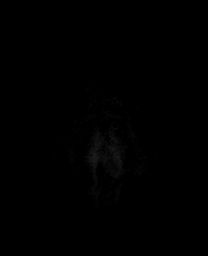

[Series 15: mip_images(sw) · axial · 24.0mm · 0.90mm/px · z∈[-126,+12]mm · 4 of 49 slices shown]
[im 1/49]
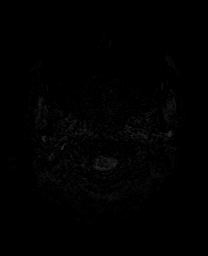
[im 17/49]
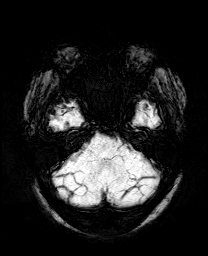
[im 33/49]
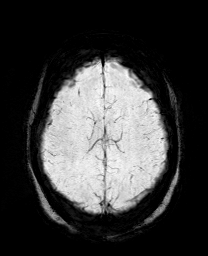
[im 49/49]
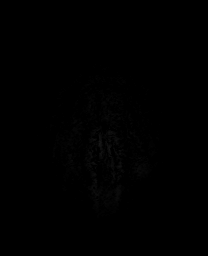

[Series 17: T2 · coronal · 5.0mm · 0.34mm/px · 2 of 31 slices shown (2 of 2)]
[im 1/31]
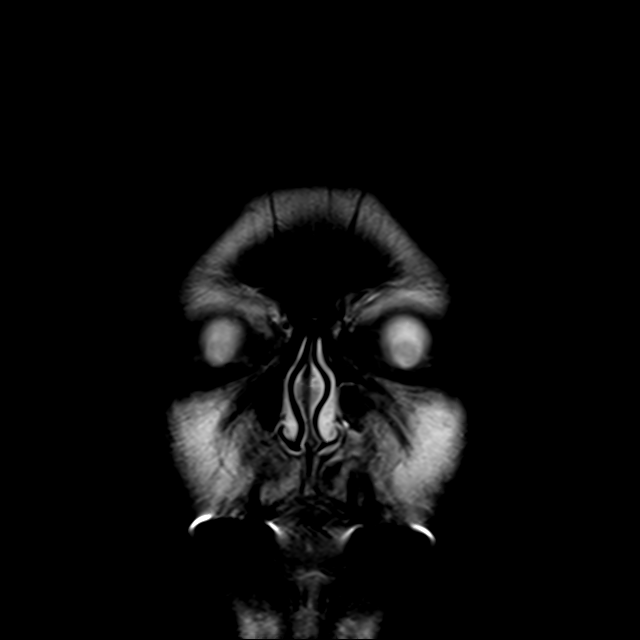
[im 31/31]
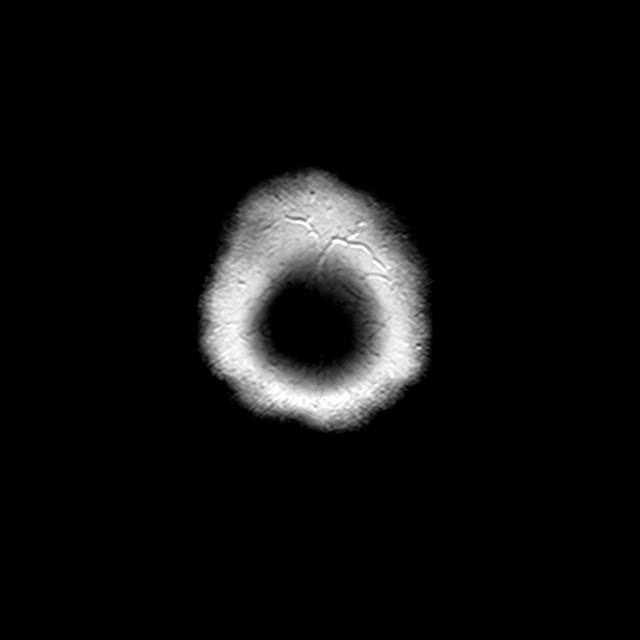

[44 of 48 positions shown; findings below may reference images not displayed]

FINDINGS: Brain: No restricted diffusion to suggest acute infarction. No
midline shift, mass effect, evidence of mass lesion,
ventriculomegaly, extra-axial collection or acute intracranial
hemorrhage.

Cerebellar tonsillar ectopia re-demonstrated (series 7, image 13).
The cerebellar tonsils symmetrically extend about 7 mm below the
level of the foramen magnum. The tonsils are NOT pegged. No signal
abnormality in the brainstem or cerebellum.

Other basilar cisterns appear normal. Negative pituitary. Gray and
white matter signal is within normal limits throughout the brain. No
encephalomalacia or chronic cerebral blood products identified.

Vascular: Major intracranial vascular flow voids are preserved.

Skull and upper cervical spine: Negative visible cervical spine,
spinal cord. Visualized bone marrow signal is within normal limits.

Sinuses/Orbits: Negative orbits. Mild to moderate bilateral
paranasal sinus mucosal thickening. Left maxillary mucous retention
cyst. Trace fluid in the right sphenoid sinus.

Other: Mastoids are clear. Normal stylomastoid foramina. Grossly
normal visible internal auditory structures. Scalp and face soft
tissues appear negative.
IMPRESSION: 1. Negative for acute stroke and no signal abnormality in the brain.

2. Confirmed mild cerebellar tonsillar ectopia as suspected on
earlier CT. Differential considerations remain Chiari 1, idiopathic
intracranial pressure abnormality, normal variant.

3. Mild paranasal sinus inflammation.

## 2021-12-14 ENCOUNTER — Encounter (INDEPENDENT_AMBULATORY_CARE_PROVIDER_SITE_OTHER): Payer: Self-pay

## 2021-12-29 ENCOUNTER — Ambulatory Visit (INDEPENDENT_AMBULATORY_CARE_PROVIDER_SITE_OTHER): Payer: BC Managed Care – PPO | Admitting: Nurse Practitioner

## 2022-01-05 ENCOUNTER — Ambulatory Visit (INDEPENDENT_AMBULATORY_CARE_PROVIDER_SITE_OTHER): Payer: BC Managed Care – PPO | Admitting: Nurse Practitioner

## 2022-01-05 ENCOUNTER — Encounter (INDEPENDENT_AMBULATORY_CARE_PROVIDER_SITE_OTHER): Payer: Self-pay | Admitting: Nurse Practitioner

## 2022-01-05 ENCOUNTER — Other Ambulatory Visit: Payer: Self-pay | Admitting: *Deleted

## 2022-01-05 ENCOUNTER — Other Ambulatory Visit (HOSPITAL_COMMUNITY): Payer: Self-pay

## 2022-01-05 VITALS — BP 107/70 | HR 80 | Temp 98.3°F | Ht 66.0 in | Wt 167.0 lb

## 2022-01-05 DIAGNOSIS — D500 Iron deficiency anemia secondary to blood loss (chronic): Secondary | ICD-10-CM

## 2022-01-05 DIAGNOSIS — E559 Vitamin D deficiency, unspecified: Secondary | ICD-10-CM | POA: Diagnosis not present

## 2022-01-05 DIAGNOSIS — Z13220 Encounter for screening for lipoid disorders: Secondary | ICD-10-CM

## 2022-01-05 DIAGNOSIS — R7301 Impaired fasting glucose: Secondary | ICD-10-CM | POA: Diagnosis not present

## 2022-01-05 DIAGNOSIS — E669 Obesity, unspecified: Secondary | ICD-10-CM

## 2022-01-05 DIAGNOSIS — R7989 Other specified abnormal findings of blood chemistry: Secondary | ICD-10-CM | POA: Diagnosis not present

## 2022-01-05 DIAGNOSIS — Z6827 Body mass index (BMI) 27.0-27.9, adult: Secondary | ICD-10-CM

## 2022-01-05 MED ORDER — SEMAGLUTIDE (1 MG/DOSE) 4 MG/3ML ~~LOC~~ SOPN
1.0000 mg | PEN_INJECTOR | SUBCUTANEOUS | 0 refills | Status: DC
  Filled 2022-01-05: qty 3, 28d supply, fill #0

## 2022-01-06 LAB — CBC WITH DIFFERENTIAL/PLATELET
Basophils Absolute: 0.1 10*3/uL (ref 0.0–0.2)
Basos: 1 %
EOS (ABSOLUTE): 0.6 10*3/uL — ABNORMAL HIGH (ref 0.0–0.4)
Eos: 9 %
Hematocrit: 36.3 % (ref 34.0–46.6)
Hemoglobin: 11.4 g/dL (ref 11.1–15.9)
Immature Grans (Abs): 0 10*3/uL (ref 0.0–0.1)
Immature Granulocytes: 0 %
Lymphocytes Absolute: 1.4 10*3/uL (ref 0.7–3.1)
Lymphs: 21 %
MCH: 25.2 pg — ABNORMAL LOW (ref 26.6–33.0)
MCHC: 31.4 g/dL — ABNORMAL LOW (ref 31.5–35.7)
MCV: 80 fL (ref 79–97)
Monocytes Absolute: 0.4 10*3/uL (ref 0.1–0.9)
Monocytes: 7 %
Neutrophils Absolute: 4.2 10*3/uL (ref 1.4–7.0)
Neutrophils: 62 %
Platelets: 258 10*3/uL (ref 150–450)
RBC: 4.53 x10E6/uL (ref 3.77–5.28)
RDW: 13.7 % (ref 11.7–15.4)
WBC: 6.7 10*3/uL (ref 3.4–10.8)

## 2022-01-06 LAB — LIPID PANEL WITH LDL/HDL RATIO
Cholesterol, Total: 126 mg/dL (ref 100–199)
HDL: 62 mg/dL (ref >39)
LDL Chol Calc (NIH): 55 mg/dL (ref 0–99)
LDL/HDL Ratio: 0.9 ratio (ref 0.0–3.2)
Triglycerides: 31 mg/dL (ref 0–149)
VLDL Cholesterol Cal: 9 mg/dL (ref 5–40)

## 2022-01-06 LAB — COMPREHENSIVE METABOLIC PANEL
ALT: 7 IU/L (ref 0–32)
AST: 15 IU/L (ref 0–40)
Albumin/Globulin Ratio: 1.4 (ref 1.2–2.2)
Albumin: 4.2 g/dL (ref 3.9–4.9)
Alkaline Phosphatase: 70 IU/L (ref 44–121)
BUN/Creatinine Ratio: 9 (ref 9–23)
BUN: 9 mg/dL (ref 6–24)
Bilirubin Total: 0.5 mg/dL (ref 0.0–1.2)
CO2: 20 mmol/L (ref 20–29)
Calcium: 9.5 mg/dL (ref 8.7–10.2)
Chloride: 105 mmol/L (ref 96–106)
Creatinine, Ser: 0.96 mg/dL (ref 0.57–1.00)
Globulin, Total: 3 g/dL (ref 1.5–4.5)
Glucose: 80 mg/dL (ref 70–99)
Potassium: 4.5 mmol/L (ref 3.5–5.2)
Sodium: 139 mmol/L (ref 134–144)
Total Protein: 7.2 g/dL (ref 6.0–8.5)
eGFR: 75 mL/min/{1.73_m2} (ref >59)

## 2022-01-06 LAB — HEMOGLOBIN A1C
Est. average glucose Bld gHb Est-mCnc: 103 mg/dL
Hgb A1c MFr Bld: 5.2 % (ref 4.8–5.6)

## 2022-01-06 LAB — INSULIN, RANDOM: INSULIN: 6 u[IU]/mL (ref 2.6–24.9)

## 2022-01-06 LAB — VITAMIN D 25 HYDROXY (VIT D DEFICIENCY, FRACTURES): Vit D, 25-Hydroxy: 37.3 ng/mL (ref 30.0–100.0)

## 2022-01-06 LAB — FERRITIN: Ferritin: 9 ng/mL — ABNORMAL LOW (ref 15–150)

## 2022-01-06 LAB — IRON: Iron: 37 ug/dL (ref 27–159)

## 2022-01-06 MED ORDER — MONTELUKAST SODIUM 10 MG PO TABS: 10.0000 mg | ORAL_TABLET | Freq: Every day | ORAL | 0 refills | Status: DC

## 2022-01-09 NOTE — Progress Notes (Signed)
Chief Complaint:   OBESITY Tamara Ball is here to discuss her progress with her obesity treatment plan along with follow-up of her obesity related diagnoses. Tamara Ball is on the Category 3 Plan and states she is following her eating plan approximately 50% of the time. Tamara Ball states she is doing HIIT 45 minutes 2 times per week.  Today's visit was #: 53 Starting weight: 205 lbs Starting date: 06/13/2020 Today's weight: 167 lbs Today's date: 01/05/2022 Total lbs lost to date: 38 lbs Total lbs lost since last in-office visit: 7 lbs  Interim History: Has overall done well with weight loss.  Following category 3.  Notes that she has been eating more sweets/junk since her last visit.  Does not fell she is  eating enough, or eating the right things.  Notes when she is not taking Ozempic on a regular basis her appetite and cravings increase.  Drinking a protein shake daily.  Drinking water, but not enough.  Water has always been a struggle.  Took Saxenda in the past but stopped due to side effects of nausea and constipation.   Subjective:   1. Vitamin D deficiency She is currently taking OTC vitamin D 2,000 IU each day. She denies nausea, vomiting or muscle weakness.  2. High serum vitamin B12 Taking a B12 complex OTC.   3. Iron deficiency anemia due to chronic blood loss History of anemia.  Saw Lottie Dawson NP last on  02/08/2021 for history of heavy cycles.   4. Impaired fasting glucose Taking Ozempic 1 mg.  Denies any side effects, denies polyphagia.   5. Screening for hyperlipidemia Labs obtained today   Assessment/Plan:   1. Vitamin D deficiency Check labs today.   - Comprehensive metabolic panel - VITAMIN D 25 Hydroxy (Vit-D Deficiency, Fractures)  2. High serum vitamin B12 Check labs today.   - Comprehensive metabolic panel  3. Iron deficiency anemia due to chronic blood loss Check labs today.   - Comprehensive metabolic panel - CBC with Differential/Platelet -  Ferritin - Iron  4. Impaired fasting glucose Will decrease Ozempic to 0.5 mg until next visit.  Does not want to lose anymore weight.  We will refill Ozempic 1 mg and will increase back to 1 mg if needed.   Check labs today.  - Hemoglobin A1c - Insulin, random  Refill- Semaglutide, 1 MG/DOSE, 4 MG/3ML SOPN; Inject 1 mg into the skin once a week.  Dispense: 3 mL; Refill: 0  5. Screening for hyperlipidemia Check labs today.   - Lipid Panel With LDL/HDL Ratio  6. Obesity, current BMI 27.1 Tamara Ball is currently in the action stage of change. As such, her goal is to continue with weight loss efforts. She has agreed to the Category 3 Plan.   Exercise goals:  as is.   Behavioral modification strategies: increasing water intake, no skipping meals, and planning for success.  Tamara Ball has agreed to follow-up with our clinic in 4 weeks. She was informed of the importance of frequent follow-up visits to maximize her success with intensive lifestyle modifications for her multiple health conditions.   Tamara Ball was informed we would discuss her lab results at her next visit unless there is a critical issue that needs to be addressed sooner. Tamara Ball agreed to keep her next visit at the agreed upon time to discuss these results.  Objective:   Blood pressure 107/70, pulse 80, temperature 98.3 F (36.8 C), height '5\' 6"'$  (1.676 m), weight 167 lb (75.8 kg), SpO2 100 %.  Body mass index is 26.95 kg/m.  General: Cooperative, alert, well developed, in no acute distress. HEENT: Conjunctivae and lids unremarkable. Cardiovascular: Regular rhythm.  Lungs: Normal work of breathing. Neurologic: No focal deficits.   Lab Results  Component Value Date   CREATININE 0.96 01/05/2022   BUN 9 01/05/2022   NA 139 01/05/2022   K 4.5 01/05/2022   CL 105 01/05/2022   CO2 20 01/05/2022   Lab Results  Component Value Date   ALT 7 01/05/2022   AST 15 01/05/2022   ALKPHOS 70 01/05/2022   BILITOT 0.5 01/05/2022    Lab Results  Component Value Date   HGBA1C 5.2 01/05/2022   HGBA1C 5.0 07/13/2020   HGBA1C 5.1 01/27/2020   HGBA1C 4.9 06/17/2019   HGBA1C 5.0 05/27/2018   Lab Results  Component Value Date   INSULIN 6.0 01/05/2022   INSULIN 7.6 07/13/2020   INSULIN 12.6 01/27/2020   INSULIN 9.1 06/17/2019   INSULIN 8.9 06/13/2018   Lab Results  Component Value Date   TSH 3.12 02/24/2021   Lab Results  Component Value Date   CHOL 126 01/05/2022   HDL 62 01/05/2022   LDLCALC 55 01/05/2022   TRIG 31 01/05/2022   CHOLHDL 2.2 07/13/2020   Lab Results  Component Value Date   VD25OH 37.3 01/05/2022   VD25OH 45.01 02/24/2021   VD25OH 52.3 07/13/2020   Lab Results  Component Value Date   WBC 6.7 01/05/2022   HGB 11.4 01/05/2022   HCT 36.3 01/05/2022   MCV 80 01/05/2022   PLT 258 01/05/2022   Lab Results  Component Value Date   IRON 37 01/05/2022   TIBC 485 (H) 02/08/2021   FERRITIN 9 (L) 01/05/2022   Attestation Statements:   Reviewed by clinician on day of visit: allergies, medications, problem list, medical history, surgical history, family history, social history, and previous encounter notes.  I, Davy Pique, RMA, am acting as transcriptionist for Everardo Pacific, FNP  I have reviewed the above documentation for accuracy and completeness, and I agree with the above. Everardo Pacific, FNP

## 2022-01-10 ENCOUNTER — Encounter: Payer: Self-pay | Admitting: Family

## 2022-01-10 ENCOUNTER — Encounter: Payer: Self-pay | Admitting: Nurse Practitioner

## 2022-01-10 ENCOUNTER — Other Ambulatory Visit: Payer: Self-pay | Admitting: Family

## 2022-01-11 ENCOUNTER — Other Ambulatory Visit: Payer: Self-pay | Admitting: *Deleted

## 2022-01-11 MED ORDER — LEVOCETIRIZINE DIHYDROCHLORIDE 5 MG PO TABS: 5.0000 mg | ORAL_TABLET | Freq: Every evening | ORAL | 0 refills | Status: DC

## 2022-01-16 ENCOUNTER — Inpatient Hospital Stay: Payer: BC Managed Care – PPO

## 2022-01-16 ENCOUNTER — Inpatient Hospital Stay: Payer: BC Managed Care – PPO | Attending: Hematology & Oncology

## 2022-01-16 ENCOUNTER — Encounter: Payer: Self-pay | Admitting: Family

## 2022-01-16 ENCOUNTER — Inpatient Hospital Stay (HOSPITAL_BASED_OUTPATIENT_CLINIC_OR_DEPARTMENT_OTHER): Payer: BC Managed Care – PPO | Admitting: Family

## 2022-01-16 VITALS — BP 113/66 | HR 78 | Temp 98.0°F | Resp 17 | Wt 171.1 lb

## 2022-01-16 VITALS — BP 115/67 | HR 71 | Resp 17

## 2022-01-16 DIAGNOSIS — D500 Iron deficiency anemia secondary to blood loss (chronic): Secondary | ICD-10-CM | POA: Diagnosis present

## 2022-01-16 DIAGNOSIS — Z808 Family history of malignant neoplasm of other organs or systems: Secondary | ICD-10-CM

## 2022-01-16 DIAGNOSIS — N920 Excessive and frequent menstruation with regular cycle: Secondary | ICD-10-CM | POA: Diagnosis not present

## 2022-01-16 LAB — CBC WITH DIFFERENTIAL (CANCER CENTER ONLY)
Abs Immature Granulocytes: 0.01 10*3/uL (ref 0.00–0.07)
Basophils Absolute: 0.1 10*3/uL (ref 0.0–0.1)
Basophils Relative: 1 %
Eosinophils Absolute: 0.4 10*3/uL (ref 0.0–0.5)
Eosinophils Relative: 6 %
HCT: 35.1 % — ABNORMAL LOW (ref 36.0–46.0)
Hemoglobin: 10.6 g/dL — ABNORMAL LOW (ref 12.0–15.0)
Immature Granulocytes: 0 %
Lymphocytes Relative: 22 %
Lymphs Abs: 1.5 10*3/uL (ref 0.7–4.0)
MCH: 24.6 pg — ABNORMAL LOW (ref 26.0–34.0)
MCHC: 30.2 g/dL (ref 30.0–36.0)
MCV: 81.4 fL (ref 80.0–100.0)
Monocytes Absolute: 0.3 10*3/uL (ref 0.1–1.0)
Monocytes Relative: 5 %
Neutro Abs: 4.5 10*3/uL (ref 1.7–7.7)
Neutrophils Relative %: 66 %
Platelet Count: 273 10*3/uL (ref 150–400)
RBC: 4.31 MIL/uL (ref 3.87–5.11)
RDW: 14.5 % (ref 11.5–15.5)
WBC Count: 6.8 10*3/uL (ref 4.0–10.5)
nRBC: 0 % (ref 0.0–0.2)

## 2022-01-16 LAB — RETICULOCYTES
Immature Retic Fract: 15.5 % (ref 2.3–15.9)
RBC.: 4.31 MIL/uL (ref 3.87–5.11)
Retic Count, Absolute: 40.9 10*3/uL (ref 19.0–186.0)
Retic Ct Pct: 1 % (ref 0.4–3.1)

## 2022-01-16 LAB — IRON AND IRON BINDING CAPACITY (CC-WL,HP ONLY)
Iron: 12 ug/dL — ABNORMAL LOW (ref 28–170)
Saturation Ratios: 2 % — ABNORMAL LOW (ref 10.4–31.8)
TIBC: 511 ug/dL — ABNORMAL HIGH (ref 250–450)
UIBC: 499 ug/dL — ABNORMAL HIGH (ref 148–442)

## 2022-01-16 LAB — FERRITIN: Ferritin: 3 ng/mL — ABNORMAL LOW (ref 11–307)

## 2022-01-16 MED ORDER — SODIUM CHLORIDE 0.9 % IV SOLN: Freq: Once | INTRAVENOUS | Status: AC

## 2022-01-16 MED ORDER — SODIUM CHLORIDE 0.9 % IV SOLN
125.0000 mg | Freq: Once | INTRAVENOUS | Status: AC
  Administered 2022-01-16: 125 mg via INTRAVENOUS
  Filled 2022-01-16: qty 10

## 2022-01-16 NOTE — Progress Notes (Signed)
Hematology and Oncology Follow Up Visit  Tamara Ball [id number] Oct 18, 1980 41 y.o. 01/16/2022   Principle Diagnosis:  Iron deficiency anemia secondary to menorrhagia    Current Therapy:        IV iron as indicated    Interim History:  Tamara Ball is here today for follow-up and IV iron. She is symptomatic with fatigue and insomnia.  She states that she stopped taking birth control due to negative side effects.  Her cycle is regular with very heavy flow.  She also notes occasional blood loss in her stool with straining/constipation and hemorrhoids.  Ferritin last week was 9.  No bruising or petechiae.  She also notes that her migraines are worse while on her cycle.  No fever, chills, n/v, cough, rash, dizziness, SOB, chest pain, palpitations, abdominal pain or changes in bowel or bladder habits.  No swelling, tenderness, numbness or tingling in her extremities at this time.  She states that she has occasional numbness and tingling that radiates down the right arm due to a pinched nerve in her neck.  No falls or syncope.  Appetite and hydration are good. Her weight is stable at 171 lbs.   ECOG Performance Status: 1 - Symptomatic but completely ambulatory  Medications:  Allergies as of 01/16/2022   No Known Allergies      Medication List        Accurate as of January 16, 2022  9:42 AM. If you have any questions, ask your nurse or doctor.          Aimovig 140 MG/ML Soaj Generic drug: Erenumab-aooe Inject 140 mg into the skin every 28 (twenty-eight) days.   Aviane 0.1-20 MG-MCG tablet Generic drug: levonorgestrel-ethinyl estradiol   b complex vitamins tablet Take 1 tablet by mouth daily.   BENADRYL PO Take by mouth as needed.   Biotin 10000 MCG Tabs Take 1 tablet by mouth daily.   Dymista 137-50 MCG/ACT Susp Generic drug: Azelastine-Fluticasone 1 actuation each nostril twice daily What changed:  when to take this reasons to take this   Fish Oil 1000 MG  Caps Take 1 capsule by mouth daily.   fluticasone 50 MCG/ACT nasal spray Commonly known as: FLONASE Place 2 sprays into both nostrils daily.   levocetirizine 5 MG tablet Commonly known as: XYZAL Take 1 tablet (5 mg total) by mouth every evening.   LORazepam 0.5 MG tablet Commonly known as: ATIVAN Take 1 tablet (0.5 mg total) by mouth 2 (two) times daily as needed for anxiety.   montelukast 10 MG tablet Commonly known as: SINGULAIR Take 1 tablet (10 mg total) by mouth at bedtime.   multivitamin with minerals Tabs tablet Take 1 tablet by mouth daily.   ondansetron 4 MG disintegrating tablet Commonly known as: ZOFRAN-ODT Take 1 tablet (4 mg total) by mouth every 8 (eight) hours as needed for nausea or vomiting.   Ozempic (1 MG/DOSE) 4 MG/3ML Sopn Generic drug: Semaglutide (1 MG/DOSE) Inject 1 mg into the skin once a week.   polyethylene glycol 17 g packet Commonly known as: MIRALAX / GLYCOLAX Take 17 g by mouth daily as needed.   VITAMIN D PO Take 10,000 Units by mouth once a week.        Allergies: No Known Allergies  Past Medical History, Surgical history, Social history, and Family History were reviewed and updated.  Review of Systems: All other 10 point review of systems is negative.   Physical Exam:  weight is 171 lb 1.9 oz (77.6  kg). Her oral temperature is 98 F (36.7 C). Her blood pressure is 113/66 and her pulse is 78. Her respiration is 17 and oxygen saturation is 100%.   Wt Readings from Last 3 Encounters:  01/16/22 171 lb 1.9 oz (77.6 kg)  01/05/22 167 lb (75.8 kg)  11/17/21 174 lb (78.9 kg)    Ocular: Sclerae unicteric, pupils equal, round and reactive to light Ear-nose-throat: Oropharynx clear, dentition fair Lymphatic: No cervical or supraclavicular adenopathy Lungs no rales or rhonchi, good excursion bilaterally Heart regular rate and rhythm, no murmur appreciated Abd soft, nontender, positive bowel sounds MSK no focal spinal tenderness, no  joint edema Neuro: non-focal, well-oriented, appropriate affect Breasts: Deferred   Lab Results  Component Value Date   WBC 6.8 01/16/2022   HGB 10.6 (L) 01/16/2022   HCT 35.1 (L) 01/16/2022   MCV 81.4 01/16/2022   PLT 273 01/16/2022   Lab Results  Component Value Date   FERRITIN 9 (L) 01/05/2022   IRON 37 01/05/2022   TIBC 485 (H) 02/08/2021   UIBC 355 02/08/2021   IRONPCTSAT 27 02/08/2021   Lab Results  Component Value Date   RETICCTPCT 1.0 01/16/2022   RBC 4.31 01/16/2022   RETICCTABS 64,400 05/16/2016   No results found for: "KPAFRELGTCHN", "LAMBDASER", "KAPLAMBRATIO" No results found for: "IGGSERUM", "IGA", "IGMSERUM" No results found for: "TOTALPROTELP", "ALBUMINELP", "A1GS", "A2GS", "BETS", "BETA2SER", "GAMS", "MSPIKE", "SPEI"   Chemistry      Component Value Date/Time   NA 139 01/05/2022 1021   K 4.5 01/05/2022 1021   K 4.2 08/11/2016 1404   CL 105 01/05/2022 1021   CL 103 08/11/2016 1404   CO2 20 01/05/2022 1021   CO2 26 08/11/2016 1404   BUN 9 01/05/2022 1021   CREATININE 0.96 01/05/2022 1021   CREATININE 0.87 08/11/2016 1404      Component Value Date/Time   CALCIUM 9.5 01/05/2022 1021   CALCIUM 9.5 08/11/2016 1404   ALKPHOS 70 01/05/2022 1021   ALKPHOS 58 08/11/2016 1404   AST 15 01/05/2022 1021   AST 9 08/11/2016 1404   ALT 7 01/05/2022 1021   ALT 8 08/11/2016 1404   BILITOT 0.5 01/05/2022 1021       Impression and Plan: Tamara Ball is a very pleasant 41 yo African American female with iron deficiency secondary to heavy cycles.  We will proceed with IV iron today and again next week.  Follow-up in 6 months.   Lottie Dawson, NP 9/11/20239:42 AM

## 2022-01-16 NOTE — Patient Instructions (Signed)
Sodium Ferric Gluconate Complex Injection What is this medication? SODIUM FERRIC GLUCONATE COMPLEX (SOE dee um FER ik GLOO koe nate KOM pleks) treats low levels of iron (iron deficiency anemia) in people with kidney disease. Iron is a mineral that plays an important role in making red blood cells, which carry oxygen from your lungs to the rest of your body. This medicine may be used for other purposes; ask your health care provider or pharmacist if you have questions. COMMON BRAND NAME(S): Ferrlecit, Nulecit What should I tell my care team before I take this medication? They need to know if you have any of the following conditions: Anemia that is not from iron deficiency High levels of iron in the blood An unusual or allergic reaction to iron, other medications, foods, dyes, or preservatives Pregnant or are trying to become pregnant Breast-feeding How should I use this medication? This medication is injected into a vein. It is given by your care team in a hospital or clinic setting. Talk to your care team about the use of this medication in children. While it may be prescribed for children as young as 6 years for selected conditions, precautions do apply. Overdosage: If you think you have taken too much of this medicine contact a poison control center or emergency room at once. NOTE: This medicine is only for you. Do not share this medicine with others. What if I miss a dose? It is important not to miss your dose. Call your care team if you are unable to keep an appointment. What may interact with this medication? Do not take this medication with any of the following: Deferasirox Deferoxamine Dimercaprol This medication may also interact with the following: Other iron products This list may not describe all possible interactions. Give your health care provider a list of all the medicines, herbs, non-prescription drugs, or dietary supplements you use. Also tell them if you smoke, drink  alcohol, or use illegal drugs. Some items may interact with your medicine. What should I watch for while using this medication? Your condition will be monitored carefully while you are receiving this medication. Visit your care team for regular checks on your progress. You may need blood work while you are taking this medication. What side effects may I notice from receiving this medication? Side effects that you should report to your care team as soon as possible: Allergic reactions--skin rash, itching, hives, swelling of the face, lips, tongue, or throat Low blood pressure--dizziness, feeling faint or lightheaded, blurry vision Shortness of breath Side effects that usually do not require medical attention (report to your care team if they continue or are bothersome): Flushing Headache Joint pain Muscle pain Nausea Pain, redness, or irritation at injection site This list may not describe all possible side effects. Call your doctor for medical advice about side effects. You may report side effects to FDA at 1-800-FDA-1088. Where should I keep my medication? This medication is given in a hospital or clinic and will not be stored at home. NOTE: This sheet is a summary. It may not cover all possible information. If you have questions about this medicine, talk to your doctor, pharmacist, or health care provider.  2023 Elsevier/Gold Standard (2020-09-17 00:00:00)  

## 2022-01-18 ENCOUNTER — Encounter: Payer: Self-pay | Admitting: Family

## 2022-01-22 ENCOUNTER — Telehealth: Payer: BC Managed Care – PPO | Admitting: Nurse Practitioner

## 2022-01-22 DIAGNOSIS — M7918 Myalgia, other site: Secondary | ICD-10-CM

## 2022-01-22 MED ORDER — BACLOFEN 10 MG PO TABS: 10.0000 mg | ORAL_TABLET | Freq: Three times a day (TID) | ORAL | 0 refills | Status: DC

## 2022-01-22 MED ORDER — NAPROXEN SODIUM 550 MG PO TABS: 550.0000 mg | ORAL_TABLET | Freq: Two times a day (BID) | ORAL | 0 refills | Status: DC

## 2022-01-22 NOTE — Progress Notes (Signed)
I have spent 5 minutes in review of e-visit questionnaire, review and updating patient chart, medical decision making and response to patient.  ° °Kerri Kovacik W Karson Reede, NP ° °  °

## 2022-01-22 NOTE — Progress Notes (Signed)
We are sorry that you are not feeling well.  Here is how we plan to help!  Based on what you have shared with me it looks like you mostly have musculoskeletal pain that can resolve in 1-3 weeks with conservative treatment.  I have prescribed Naprosyn 500 mg take one by mouth twice a day non-steroid anti-inflammatory (NSAID) as well as Baclofen 10 mg every eight hours as needed which is a muscle relaxer  Some patients experience stomach irritation or in increased heartburn with anti-inflammatory drugs.  Please keep in mind that muscle relaxer's can cause fatigue and should not be taken while at work or driving.  Back pain is very common.  The pain often gets better over time.  The cause of back pain is usually not dangerous.  Most people can learn to manage their back pain on their own.  Home Care Stay active.  Start with short walks on flat ground if you can.  Try to walk farther each day. Do not sit, drive or stand in one place for more than 30 minutes.  Do not stay in bed. Do not avoid exercise or work.  Activity can help your back heal faster. Be careful when you bend or lift an object.  Bend at your knees, keep the object close to you, and do not twist. Sleep on a firm mattress.  Lie on your side, and bend your knees.  If you lie on your back, put a pillow under your knees. Only take medicines as told by your doctor. Put ice on the injured area. Put ice in a plastic bag Place a towel between your skin and the bag Leave the ice on for 15-20 minutes, 3-4 times a day for the first 2-3 days. 210 After that, you can switch between ice and heat packs. Ask your doctor about back exercises or massage. Avoid feeling anxious or stressed.  Find good ways to deal with stress, such as exercise.  Get Help Right Way If: Your pain does not go away with rest or medicine. Your pain does not go away in 1 week. You have new problems. You do not feel well. The pain spreads into your legs. You cannot control  when you poop (bowel movement) or pee (urinate) You feel sick to your stomach (nauseous) or throw up (vomit) You have belly (abdominal) pain. You feel like you may pass out (faint). If you develop a fever.  Make Sure you: Understand these instructions. Will watch your condition Will get help right away if you are not doing well or get worse.  Your e-visit answers were reviewed by a board certified advanced clinical practitioner to complete your personal care plan.  Depending on the condition, your plan could have included both over the counter or prescription medications.  If there is a problem please reply  once you have received a response from your provider.  Your safety is important to [name].  If you have drug allergies check your prescription carefully.    You can use MyChart to ask questions about today's visit, request a non-urgent call back, or ask for a work or school excuse for 24 hours related to this e-Visit. If it has been greater than 24 hours you will need to follow up with your provider, or enter a new e-Visit to address those concerns.  You will get an e-mail in the next two days asking about your experience.  I hope that your e-visit has been valuable and will speed your recovery.  Thank you for using e-visits.

## 2022-01-25 ENCOUNTER — Ambulatory Visit: Payer: BC Managed Care – PPO | Admitting: Family Medicine

## 2022-01-26 ENCOUNTER — Inpatient Hospital Stay: Payer: BC Managed Care – PPO

## 2022-01-26 VITALS — BP 113/64 | HR 68 | Temp 98.0°F | Resp 17

## 2022-01-26 DIAGNOSIS — D500 Iron deficiency anemia secondary to blood loss (chronic): Secondary | ICD-10-CM

## 2022-01-26 DIAGNOSIS — N920 Excessive and frequent menstruation with regular cycle: Secondary | ICD-10-CM

## 2022-01-26 MED ORDER — SODIUM CHLORIDE 0.9 % IV SOLN: Freq: Once | INTRAVENOUS | Status: AC

## 2022-01-26 MED ORDER — SODIUM CHLORIDE 0.9 % IV SOLN
125.0000 mg | Freq: Once | INTRAVENOUS | Status: AC
  Administered 2022-01-26: 125 mg via INTRAVENOUS
  Filled 2022-01-26: qty 10

## 2022-01-26 NOTE — Progress Notes (Signed)
Pt declined to stay for post infusion observation period. Pt stated she has tolerated medication multiple times prior without difficulty. Pt aware to call clinic with any questions or concerns. Pt verbalized understanding and had no further questions.  ? ?

## 2022-01-26 NOTE — Patient Instructions (Signed)
Sodium Ferric Gluconate Complex injection What is this medicine? SODIUM FERRIC GLUCONATE COMPLEX (SOE dee um FER ik GLOO koe nate KOM pleks) is an iron replacement. It is used with epoetin therapy to treat low iron levels in patients who are receiving hemodialysis. This medicine may be used for other purposes; ask your health care provider or pharmacist if you have questions. COMMON BRAND NAME(S): Ferrlecit, Nulecit What should I tell my health care provider before I take this medicine? They need to know if you have any of the following conditions:  anemia that is not from iron deficiency  high levels of iron in the body  an unusual or allergic reaction to iron, benzyl alcohol, other medicines, foods, dyes, or preservatives  pregnant or are trying to become pregnant  breast-feeding How should I use this medicine? This medicine is for infusion into a vein. It is given by a health care professional in a hospital or clinic setting. Talk to your pediatrician regarding the use of this medicine in children. While this drug may be prescribed for children as young as 6 years old for selected conditions, precautions do apply. Overdosage: If you think you have taken too much of this medicine contact a poison control center or emergency room at once. NOTE: This medicine is only for you. Do not share this medicine with others. What if I miss a dose? It is important not to miss your dose. Call your doctor or health care professional if you are unable to keep an appointment. What may interact with this medicine? Do not take this medicine with any of the following medications:  deferoxamine  dimercaprol  other iron products This medicine may also interact with the following medications:  chloramphenicol  deferasirox  medicine for blood pressure like enalapril This list may not describe all possible interactions. Give your health care provider a list of all the medicines, herbs,  non-prescription drugs, or dietary supplements you use. Also tell them if you smoke, drink alcohol, or use illegal drugs. Some items may interact with your medicine. What should I watch for while using this medicine? Your condition will be monitored carefully while you are receiving this medicine. Visit your doctor for check-ups as directed. What side effects may I notice from receiving this medicine? Side effects that you should report to your doctor or health care professional as soon as possible:  allergic reactions like skin rash, itching or hives, swelling of the face, lips, or tongue  breathing problems  changes in hearing  changes in vision  chills, flushing, or sweating  fast, irregular heartbeat  feeling faint or lightheaded, falls  fever, flu-like symptoms  high or low blood pressure  pain, tingling, numbness in the hands or feet  severe pain in the chest, back, flanks, or groin  swelling of the ankles, feet, hands  trouble passing urine or change in the amount of urine  unusually weak or tired Side effects that usually do not require medical attention (report to your doctor or health care professional if they continue or are bothersome):  cramps  dark colored stools  diarrhea  headache  nausea, vomiting  stomach upset This list may not describe all possible side effects. Call your doctor for medical advice about side effects. You may report side effects to FDA at 1-800-FDA-1088. Where should I keep my medicine? This drug is given in a hospital or clinic and will not be stored at home. NOTE: This sheet is a summary. It may not cover all   possible information. If you have questions about this medicine, talk to your doctor, pharmacist, or health care provider.  2021 Elsevier/Gold Standard (2007-12-25 15:58:57)   

## 2022-02-02 ENCOUNTER — Encounter: Payer: Self-pay | Admitting: Nurse Practitioner

## 2022-02-02 ENCOUNTER — Ambulatory Visit: Payer: BC Managed Care – PPO | Admitting: Nurse Practitioner

## 2022-02-02 ENCOUNTER — Other Ambulatory Visit (HOSPITAL_COMMUNITY): Payer: Self-pay

## 2022-02-02 VITALS — BP 102/50 | HR 80 | Temp 97.9°F | Ht 66.0 in | Wt 166.0 lb

## 2022-02-02 DIAGNOSIS — E669 Obesity, unspecified: Secondary | ICD-10-CM | POA: Diagnosis not present

## 2022-02-02 DIAGNOSIS — E559 Vitamin D deficiency, unspecified: Secondary | ICD-10-CM

## 2022-02-02 DIAGNOSIS — Z6826 Body mass index (BMI) 26.0-26.9, adult: Secondary | ICD-10-CM | POA: Diagnosis not present

## 2022-02-02 DIAGNOSIS — R7301 Impaired fasting glucose: Secondary | ICD-10-CM | POA: Diagnosis not present

## 2022-02-02 MED ORDER — SEMAGLUTIDE (1 MG/DOSE) 4 MG/3ML ~~LOC~~ SOPN
1.0000 mg | PEN_INJECTOR | SUBCUTANEOUS | 0 refills | Status: DC
  Filled 2022-02-02: qty 3, 28d supply, fill #0

## 2022-02-03 ENCOUNTER — Ambulatory Visit: Payer: BC Managed Care – PPO | Admitting: Orthopaedic Surgery

## 2022-02-03 NOTE — Progress Notes (Unsigned)
Chief Complaint:   OBESITY Tamara Ball is here to discuss her progress with her obesity treatment plan along with follow-up of her obesity related diagnoses. Tamara Ball is on the Category 3 Plan and states she is following her eating plan approximately 0% of the time. [name] states she is exercising 0 minutes 0 times per week.  Today's visit was #: 50 Starting weight: 205 lbs Starting date: 06/13/2020 Today's weight: 166 lbs Today's date: 02/02/2022 Total lbs lost to date: 39 lbs Total lbs lost since last in-office visit: 8  Interim History: [name] has overall done well with weight loss and is happy with her current weight. She was concerned she gained weight due to not exercising and eating more off plan, craving sweets. Has been busier at work over the past couple of weeks and has not been cooking as much at home. She has been eating out more.   Subjective:   1. Impaired fasting glucose Labs discussed during visit today. Tamara Ball is taking Ozempic '1mg'$ . Denies any side effects. Decreased dose to 0.'5mg'$  after last visit but she did not stay on 0.'5mg'$  due to increased hunger. Her last insulin looked better at 6.0 and A1c 5.2.  2. Vitamin D deficiency Labs discussed during visit today. Tamara Ball is not taking Vit D.  Assessment/Plan:   1. Impaired fasting glucose We will refill Ozempic 1 mg once a week for 1 month with 0 refills.  Side effects discussed.    Tamara Ball will start back exercising and decreasing simple carbohydrates to help decrease the risk of diabetes. Tamara Ball agreed to follow-up with [name] as directed to closely monitor her progress.   -Refill Semaglutide, 1 MG/DOSE, 4 MG/3ML SOPN; Inject 1 mg into the skin once a week.  Dispense: 3 mL; Refill: 0  2. Vitamin D deficiency Tamara Ball will Start taking over the counter Vit D.  3. Obesity, current BMI 26.8 Tamara Ball is currently in the action stage of change. As such, her goal is to continue with weight loss efforts. She has agreed to  practicing portion control and making smarter food choices, such as increasing vegetables and decreasing simple carbohydrates.   Exercise goals: All adults should avoid inactivity. Some physical activity is better than none, and adults who participate in any amount of physical activity gain some health benefits.  Tamara Ball plans to start going back to the gym and to start walking. Labs discussed during visit today.  Behavioral modification strategies: increasing lean protein intake, increasing vegetables, increasing water intake, no skipping meals, and meal planning and cooking strategies.  Tamara Ball has agreed to follow-up with our clinic in 6 weeks. She was informed of the importance of frequent follow-up visits to maximize her success with intensive lifestyle modifications for her multiple health conditions.   Objective:   Blood pressure (!) 102/50, pulse 80, temperature 97.9 F (36.6 C), temperature source Oral, height '5\' 6"'$  (1.676 m), weight 166 lb (75.3 kg), SpO2 100 %. Body mass index is 26.79 kg/m.  General: Cooperative, alert, well developed, in no acute distress. HEENT: Conjunctivae and lids unremarkable. Cardiovascular: Regular rhythm.  Lungs: Normal work of breathing. Neurologic: No focal deficits.   Lab Results  Component Value Date   CREATININE 0.96 01/05/2022   BUN 9 01/05/2022   NA 139 01/05/2022   K 4.5 01/05/2022   CL 105 01/05/2022   CO2 20 01/05/2022   Lab Results  Component Value Date   ALT 7 01/05/2022   AST 15 01/05/2022   ALKPHOS 70 01/05/2022  BILITOT 0.5 01/05/2022   Lab Results  Component Value Date   HGBA1C 5.2 01/05/2022   HGBA1C 5.0 07/13/2020   HGBA1C 5.1 01/27/2020   HGBA1C 4.9 06/17/2019   HGBA1C 5.0 05/27/2018   Lab Results  Component Value Date   INSULIN 6.0 01/05/2022   INSULIN 7.6 07/13/2020   INSULIN 12.6 01/27/2020   INSULIN 9.1 06/17/2019   INSULIN 8.9 06/13/2018   Lab Results  Component Value Date   TSH 3.12 02/24/2021    Lab Results  Component Value Date   CHOL 126 01/05/2022   HDL 62 01/05/2022   LDLCALC 55 01/05/2022   TRIG 31 01/05/2022   CHOLHDL 2.2 07/13/2020   Lab Results  Component Value Date   VD25OH 37.3 01/05/2022   VD25OH 45.01 02/24/2021   VD25OH 52.3 07/13/2020   Lab Results  Component Value Date   WBC 6.8 01/16/2022   HGB 10.6 (L) 01/16/2022   HCT 35.1 (L) 01/16/2022   MCV 81.4 01/16/2022   PLT 273 01/16/2022   Lab Results  Component Value Date   IRON 12 (L) 01/16/2022   TIBC 511 (H) 01/16/2022   FERRITIN 3 (L) 01/16/2022   Attestation Statements:   Reviewed by clinician on day of visit: allergies, medications, problem list, medical history, surgical history, family history, social history, and previous encounter notes.  I, Brendell Tyus, RMA, am acting as transcriptionist for Tamara Pacific, FNP.  I have reviewed the above documentation for accuracy and completeness, and I agree with the above. Tamara Pacific, FNP

## 2022-02-04 ENCOUNTER — Other Ambulatory Visit (HOSPITAL_COMMUNITY): Payer: Self-pay

## 2022-02-06 ENCOUNTER — Other Ambulatory Visit (HOSPITAL_COMMUNITY): Payer: Self-pay

## 2022-02-09 ENCOUNTER — Ambulatory Visit: Payer: BC Managed Care – PPO | Admitting: Orthopaedic Surgery

## 2022-02-23 ENCOUNTER — Other Ambulatory Visit: Payer: Self-pay | Admitting: Family Medicine

## 2022-02-28 NOTE — Progress Notes (Deleted)
NEUROLOGY FOLLOW UP OFFICE NOTE  TRIXY LOYOLA [id number]  Assessment/Plan:   Stroke like episode likely migraine with aura Migraine without aura, without status migrainosus, not intractable Chronic tension type headache Chiari 1 malformation - incidental   Migraine prevention:  Start Aimovig '140mg'$  every 28 days Migraine rescue:  She will try Ubrelvy '100mg'$  and let me know if effective.  Zofran for nausea Stop Excedrin.  Limit use of pain relievers to no more than 2 days out of week to prevent risk of rebound or medication-overuse headache. Keep headache diary Follow up 4 months.       Subjective:  Tamara Ball is a 41 year old right-handed female with migraines, iron deficiency anemia, who follows up for chronic tension type headaches and episodic migraines  UPDATE: Started Aimovig.  Roselyn Meier ***  Current NSAIDS/analgesics:  Excedrin Tension (daily) Current triptans:  none Current ergotamine:  none Current anti-emetic:  none Current muscle relaxants:  none Current Antihypertensive medications:  none Current Antidepressant medications:  none Current Anticonvulsant medications:  none Current anti-CGRP:  *** Current Vitamins/Herbal/Supplements:  none Current Antihistamines/Decongestants:  Benadryl, Xyzal Other therapy:  Biofreeze Hormone/birth control:  yes - just started for fibroid - doesn't remember name Other medications:  Ativan PRN (anxiety)  Caffeine:  No daily caffeine Alcohol:  no Smoker:  no Diet:  Needs to drink more water.  Zero-sugar soda Exercise: 1 to 2 times a week Depression/stress:  Stress related to work (works as Scientist, physiological for [employer]) Sleep hygiene:  poor.  Tosses and turns.  Sleep study negative.     HISTORY: TENSION-TYPE HEADACHES: Onset:  60-45 years old Location:  Top of head and bi-temporal, behind eyes Quality:  Pressure-like, sharp pain on top of head Intensity:  5-6/10 Aura:  no Prodrome:  no Associated symptoms:   none Duration:  all day Frequency:  daily Triggers/exacerbating factors:  sinus congestion Relieving factors:  none Activity:  Able to function     MIGRAINE: Onset:  18-19 Location:  Top of head and bi-temporal, behind eyes Quality:  throbbing/pounding Intensity:  8/10 Aura:  no Prodrome:  no Associated symptoms:  Dizziness, nausea, photophobia Duration:  all day Frequency:  2-3 days (week before to week of her cycle) Triggers/exacerbating factors:  sinus congestion, menstrual cycle (inconsistent) Relieving factors:  none Activity:  Lays down in dark   She was seen in the ED on 06/20/2020 for presumed complicated migraine presenting with headache and left sided facial and arm numbness out of sleep.  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  Lasted just that day.  Denies double vision, dysphagia, ongoing numbness and weakness of extremities.      Past NSAIDS/analgesics:  acetaminophen, ibuprofen.  Advised to avoid NSAIDs due to anemia and questionable GI bleed. Past abortive triptans:  rizatriptan '10mg'$ , sumatriptan '50mg'$  Past abortive ergotamine:  none Past muscle relaxants:  cyclobenzaprine, tizanidine Past anti-emetic:  Zofran Past antihypertensive medications:  none Past antidepressant medications:  nortriptyline, Wellbutrin Past anticonvulsant medications:  zonisamide Past anti-CGRP:  none Past vitamins/Herbal/Supplements:  none Past antihistamines/decongestants:  Zyrtec, Flonase, Claritin Other past therapies:  massage therapy, trigger point injections    Family history of headache:  Father's side unknown.  Her son has headaches.  No known family history of intracranial aneurysms.  PAST MEDICAL HISTORY: Past Medical History:  Diagnosis Date   Anemia    Anxiety  Chicken pox    Chronic headaches    Constipation    Fibroids    Iron  deficiency anemia due to chronic blood loss    from heavy periods   Menorrhagia    Palpitations     MEDICATIONS: Current Outpatient Medications on File Prior to Visit  Medication Sig Dispense Refill   AVIANE 0.1-20 MG-MCG tablet      b complex vitamins tablet Take 1 tablet by mouth daily.     baclofen (LIORESAL) 10 MG tablet Take 1 tablet (10 mg total) by mouth 3 (three) times daily. 42 each 0   Biotin 10000 MCG TABS Take 1 tablet by mouth daily.     diphenhydrAMINE HCl (BENADRYL PO) Take by mouth as needed.     DYMISTA 137-50 MCG/ACT SUSP 1 actuation each nostril twice daily (Patient taking differently: as needed. 1 actuation each nostril twice daily) 23 g 3   Erenumab-aooe (AIMOVIG) 140 MG/ML SOAJ Inject 140 mg into the skin every 28 (twenty-eight) days. 1.12 mL 11   fluticasone (FLONASE) 50 MCG/ACT nasal spray Place 2 sprays into both nostrils daily. 16 g 5   levocetirizine (XYZAL) 5 MG tablet TAKE 1 TABLET BY MOUTH ONCE DAILY IN THE EVENING 30 tablet 0   LORazepam (ATIVAN) 0.5 MG tablet Take 1 tablet (0.5 mg total) by mouth 2 (two) times daily as needed for anxiety. 30 tablet 1   montelukast (SINGULAIR) 10 MG tablet TAKE 1 TABLET BY MOUTH AT BEDTIME (NEED  APPT  FOR  FURTHER  REFILLS) 30 tablet 0   Multiple Vitamin (MULTIVITAMIN WITH MINERALS) TABS tablet Take 1 tablet by mouth daily.     naproxen sodium (ANAPROX DS) 550 MG tablet Take 1 tablet (550 mg total) by mouth 2 (two) times daily with a meal. 60 tablet 0   Omega-3 Fatty Acids (FISH OIL) 1000 MG CAPS Take 1 capsule by mouth daily.     ondansetron (ZOFRAN-ODT) 4 MG disintegrating tablet Take 1 tablet (4 mg total) by mouth every 8 (eight) hours as needed for nausea or vomiting. 20 tablet 5   polyethylene glycol (MIRALAX / GLYCOLAX) 17 g packet Take 17 g by mouth daily as needed.     Semaglutide, 1 MG/DOSE, 4 MG/3ML SOPN Inject 1 mg into the skin once a week. 3 mL 0   VITAMIN D PO Take 10,000 Units by mouth once a week.     No  current facility-administered medications on file prior to visit.    ALLERGIES: No Known Allergies  FAMILY HISTORY: Family History  Problem Relation Age of Onset   Hypertension Mother    Cancer Mother        SCC (x2) dx. 3 and again 6 months later   Other Sister        tumors- unknown type   Cancer Maternal Aunt        colorectal cancer   Hypertension Maternal Uncle    Healthy Maternal Grandmother    Diabetes Maternal Grandfather    Glaucoma Maternal Grandfather    Hypertension Maternal Grandfather    Hyperlipidemia Maternal Grandfather    Gout Maternal Grandfather    Colon polyps Neg Hx    Colon cancer Neg Hx    Esophageal cancer Neg Hx       Objective:  *** General: No acute distress.  Patient appears ***-groomed.   Head:  Normocephalic/atraumatic Eyes:  Fundi examined but not visualized Neck: supple, no paraspinal tenderness, full range of motion Heart:  Regular rate and  rhythm Lungs:  Clear to auscultation bilaterally Back: No paraspinal tenderness Neurological Exam: alert and oriented to person, place, and time.  Speech fluent and not dysarthric, language intact.  CN II-XII intact. Bulk and tone normal, muscle strength 5/5 throughout.  Sensation to light touch intact.  Deep tendon reflexes 2+ throughout, toes downgoing.  Finger to nose testing intact.  Gait normal, Romberg negative.   Metta Clines, DO  CC: ***

## 2022-03-03 ENCOUNTER — Ambulatory Visit: Payer: BC Managed Care – PPO | Admitting: Neurology

## 2022-03-03 DIAGNOSIS — Z0279 Encounter for issue of other medical certificate: Secondary | ICD-10-CM

## 2022-03-06 ENCOUNTER — Encounter: Payer: Self-pay | Admitting: Neurology

## 2022-03-08 ENCOUNTER — Telehealth: Payer: BC Managed Care – PPO | Admitting: Physician Assistant

## 2022-03-08 DIAGNOSIS — B3731 Acute candidiasis of vulva and vagina: Secondary | ICD-10-CM

## 2022-03-08 MED ORDER — FLUCONAZOLE 150 MG PO TABS: 150.0000 mg | ORAL_TABLET | ORAL | 0 refills | Status: DC | PRN

## 2022-03-08 NOTE — Progress Notes (Signed)

## 2022-03-16 ENCOUNTER — Ambulatory Visit: Payer: BC Managed Care – PPO | Admitting: Nurse Practitioner

## 2022-03-16 ENCOUNTER — Other Ambulatory Visit (HOSPITAL_COMMUNITY): Payer: Self-pay

## 2022-03-16 ENCOUNTER — Encounter: Payer: Self-pay | Admitting: Nurse Practitioner

## 2022-03-16 VITALS — BP 110/62 | HR 80 | Temp 98.3°F | Ht 66.0 in | Wt 165.0 lb

## 2022-03-16 DIAGNOSIS — E559 Vitamin D deficiency, unspecified: Secondary | ICD-10-CM

## 2022-03-16 DIAGNOSIS — E669 Obesity, unspecified: Secondary | ICD-10-CM | POA: Diagnosis not present

## 2022-03-16 DIAGNOSIS — R7301 Impaired fasting glucose: Secondary | ICD-10-CM | POA: Diagnosis not present

## 2022-03-16 DIAGNOSIS — Z6826 Body mass index (BMI) 26.0-26.9, adult: Secondary | ICD-10-CM

## 2022-03-16 MED ORDER — SEMAGLUTIDE (1 MG/DOSE) 4 MG/3ML ~~LOC~~ SOPN
1.0000 mg | PEN_INJECTOR | SUBCUTANEOUS | 0 refills | Status: DC
  Filled 2022-03-16: qty 3, 28d supply, fill #0

## 2022-03-16 MED ORDER — SEMAGLUTIDE (1 MG/DOSE) 4 MG/3ML ~~LOC~~ SOPN
1.0000 mg | PEN_INJECTOR | SUBCUTANEOUS | 0 refills | Status: DC
  Filled 2022-03-16: qty 3, 28d supply, fill #0
  Filled 2022-04-14: qty 3, 28d supply, fill #1

## 2022-03-23 NOTE — Progress Notes (Signed)
Chief Complaint:   OBESITY Tamara Ball is here to discuss her progress with her obesity treatment plan along with follow-up of her obesity related diagnoses. Tamara Ball is on practicing portion control and making smarter food choices, such as increasing vegetables and decreasing simple carbohydrates and states she is following her eating plan approximately 50% of the time. Tamara Ball states she is exercising 0 minutes 0 times per week.  Today's visit was #: 63 Starting weight: 205 lbs Starting date: 06/13/2020 Today's weight: 166 lbs Today's date: 03/16/2022 Total lbs lost to date: 39 lbs Total lbs lost since last in-office visit: 0  Interim History: Tamara Ball has lost 1 lb since her last visit. She is happy with her weight and does not want to lose anymore. She would like to gain 5 lbs.  Subjective:   1. Impaired fasting glucose Tamara Ball has been off Ozempic for the past 2 weeks. Notes some hunger and cravings. Denies any side effects while taking.  2. Vitamin D deficiency Tamara Ball is taking over the counter Vit D.  Denies side effects.  Assessment/Plan:   1. Impaired fasting glucose We will refill Ozempic 1 mg SQ, Take 0.5 mg SQ 1st week then increase back up to 1 mg. Refill for 3 months with 0 refills. Side effects discussed.   -Refill Semaglutide, 1 MG/DOSE, 4 MG/3ML SOPN; Inject 1 mg into the skin once a week.  Dispense: 9 mL; Refill: 0  2. Vitamin D deficiency Will continue to monitor.  3. Obesity, current BMI 26.8 Tamara Ball is currently in the action stage of change. As such, her goal is to continue with weight loss efforts. She has agreed to practicing portion control and making smarter food choices, such as increasing vegetables and decreasing simple carbohydrates.   Exercise goals: All adults should avoid inactivity. Some physical activity is better than none, and adults who participate in any amount of physical activity gain some health benefits.  Tamara Ball plans to start resistance  training.  Behavioral modification strategies: increasing lean protein intake, increasing vegetables, increasing water intake, and holiday eating strategies .  Tamara Ball has agreed to follow-up with our clinic in 6 weeks. She was informed of the importance of frequent follow-up visits to maximize her success with intensive lifestyle modifications for her multiple health conditions.   Objective:   Blood pressure 110/62, pulse 80, temperature 98.3 F (36.8 C), temperature source Oral, height '5\' 6"'$  (1.676 m), weight 165 lb (74.8 kg), SpO2 100 %. Body mass index is 26.63 kg/m.  General: Cooperative, alert, well developed, in no acute distress. HEENT: Conjunctivae and lids unremarkable. Cardiovascular: Regular rhythm.  Lungs: Normal work of breathing. Neurologic: No focal deficits.   Lab Results  Component Value Date   CREATININE 0.96 01/05/2022   BUN 9 01/05/2022   NA 139 01/05/2022   K 4.5 01/05/2022   CL 105 01/05/2022   CO2 20 01/05/2022   Lab Results  Component Value Date   ALT 7 01/05/2022   AST 15 01/05/2022   ALKPHOS 70 01/05/2022   BILITOT 0.5 01/05/2022   Lab Results  Component Value Date   HGBA1C 5.2 01/05/2022   HGBA1C 5.0 07/13/2020   HGBA1C 5.1 01/27/2020   HGBA1C 4.9 06/17/2019   HGBA1C 5.0 05/27/2018   Lab Results  Component Value Date   INSULIN 6.0 01/05/2022   INSULIN 7.6 07/13/2020   INSULIN 12.6 01/27/2020   INSULIN 9.1 06/17/2019   INSULIN 8.9 06/13/2018   Lab Results  Component Value Date   TSH  3.12 02/24/2021   Lab Results  Component Value Date   CHOL 126 01/05/2022   HDL 62 01/05/2022   LDLCALC 55 01/05/2022   TRIG 31 01/05/2022   CHOLHDL 2.2 07/13/2020   Lab Results  Component Value Date   VD25OH 37.3 01/05/2022   VD25OH 45.01 02/24/2021   VD25OH 52.3 07/13/2020   Lab Results  Component Value Date   WBC 6.8 01/16/2022   HGB 10.6 (L) 01/16/2022   HCT 35.1 (L) 01/16/2022   MCV 81.4 01/16/2022   PLT 273 01/16/2022   Lab  Results  Component Value Date   IRON 12 (L) 01/16/2022   TIBC 511 (H) 01/16/2022   FERRITIN 3 (L) 01/16/2022   Attestation Statements:   Reviewed by clinician on day of visit: allergies, medications, problem list, medical history, surgical history, family history, social history, and previous encounter notes.  I, Brendell Tyus, RMA, am acting as transcriptionist for Everardo Pacific, FNP.  I have reviewed the above documentation for accuracy and completeness, and I agree with the above. Everardo Pacific, FNP

## 2022-03-28 ENCOUNTER — Other Ambulatory Visit: Payer: Self-pay | Admitting: Family Medicine

## 2022-04-14 ENCOUNTER — Other Ambulatory Visit (HOSPITAL_COMMUNITY): Payer: Self-pay

## 2022-04-15 ENCOUNTER — Other Ambulatory Visit (HOSPITAL_COMMUNITY): Payer: Self-pay

## 2022-04-27 ENCOUNTER — Other Ambulatory Visit: Payer: Self-pay | Admitting: Family Medicine

## 2022-04-27 ENCOUNTER — Telehealth: Payer: BC Managed Care – PPO | Admitting: Emergency Medicine

## 2022-04-27 ENCOUNTER — Ambulatory Visit: Payer: BC Managed Care – PPO | Admitting: Nurse Practitioner

## 2022-04-27 ENCOUNTER — Encounter: Payer: Self-pay | Admitting: Nurse Practitioner

## 2022-04-27 VITALS — BP 105/71 | HR 77 | Temp 97.8°F | Ht 66.0 in | Wt 166.0 lb

## 2022-04-27 DIAGNOSIS — B3731 Acute candidiasis of vulva and vagina: Secondary | ICD-10-CM | POA: Diagnosis not present

## 2022-04-27 DIAGNOSIS — Z6826 Body mass index (BMI) 26.0-26.9, adult: Secondary | ICD-10-CM | POA: Diagnosis not present

## 2022-04-27 DIAGNOSIS — R7301 Impaired fasting glucose: Secondary | ICD-10-CM | POA: Diagnosis not present

## 2022-04-27 DIAGNOSIS — E669 Obesity, unspecified: Secondary | ICD-10-CM | POA: Diagnosis not present

## 2022-04-27 MED ORDER — SEMAGLUTIDE (1 MG/DOSE) 4 MG/3ML ~~LOC~~ SOPN: 1.0000 mg | PEN_INJECTOR | SUBCUTANEOUS | 0 refills | Status: DC

## 2022-04-27 MED ORDER — FLUCONAZOLE 150 MG PO TABS: 150.0000 mg | ORAL_TABLET | ORAL | 0 refills | Status: DC | PRN

## 2022-04-27 NOTE — Progress Notes (Signed)
E-Visit for Vaginal Symptoms  We are sorry that you are not feeling well. Here is how we plan to help! Based on what you shared with me it looks like you: May have a yeast vaginosis  Vaginosis is an inflammation of the vagina that can result in discharge, itching and pain. The cause is usually a change in the normal balance of vaginal bacteria or an infection. Vaginosis can also result from reduced estrogen levels after menopause.  The most common causes of vaginosis are:   Bacterial vaginosis which results from an overgrowth of one on several organisms that are normally present in your vagina.   Yeast infections which are caused by a naturally occurring fungus called candida.   Vaginal atrophy (atrophic vaginosis) which results from the thinning of the vagina from reduced estrogen levels after menopause.   Trichomoniasis which is caused by a parasite and is commonly transmitted by sexual intercourse.  Factors that increase your risk of developing vaginosis include: Medications, such as antibiotics and steroids Uncontrolled diabetes Use of hygiene products such as bubble bath, vaginal spray or vaginal deodorant Douching Wearing damp or tight-fitting clothing Using an intrauterine device (IUD) for birth control Hormonal changes, such as those associated with pregnancy, birth control pills or menopause Sexual activity Having a sexually transmitted infection  Your treatment plan is A single Diflucan (fluconazole) 150mg tablet once.  I have electronically sent this prescription into the pharmacy that you have chosen. I have sent in 2 doses for you.   Be sure to take all of the medication as directed. Stop taking any medication if you develop a rash, tongue swelling or shortness of breath. Mothers who are breast feeding should consider pumping and discarding their breast milk while on these antibiotics. However, there is no consensus that infant exposure at these doses would be harmful.   Remember that medication creams can weaken latex condoms. .   HOME CARE:  Good hygiene may prevent some types of vaginosis from recurring and may relieve some symptoms:  Avoid baths, hot tubs and whirlpool spas. Rinse soap from your outer genital area after a shower, and dry the area well to prevent irritation. Don't use scented or harsh soaps, such as those with deodorant or antibacterial action. Avoid irritants. These include scented tampons and pads. Wipe from front to back after using the toilet. Doing so avoids spreading fecal bacteria to your vagina.  Other things that may help prevent vaginosis include:  Don't douche. Your vagina doesn't require cleansing other than normal bathing. Repetitive douching disrupts the normal organisms that reside in the vagina and can actually increase your risk of vaginal infection. Douching won't clear up a vaginal infection. Use a latex condom. Both female and female latex condoms may help you avoid infections spread by sexual contact. Wear cotton underwear. Also wear pantyhose with a cotton crotch. If you feel comfortable without it, skip wearing underwear to bed. Yeast thrives in moist environments Your symptoms should improve in the next day or two.  GET HELP RIGHT AWAY IF:  You have pain in your lower abdomen ( pelvic area or over your ovaries) You develop nausea or vomiting You develop a fever Your discharge changes or worsens You have persistent pain with intercourse You develop shortness of breath, a rapid pulse, or you faint.  These symptoms could be signs of problems or infections that need to be evaluated by a medical provider now.  MAKE SURE YOU   Understand these instructions. Will watch your condition.   Will get help right away if you are not doing well or get worse.  Thank you for choosing an e-visit.  Your e-visit answers were reviewed by a board certified advanced clinical practitioner to complete your personal care plan.  Depending upon the condition, your plan could have included both over the counter or prescription medications.  Please review your pharmacy choice. Make sure the pharmacy is open so you can pick up prescription now. If there is a problem, you may contact your provider through MyChart messaging and have the prescription routed to another pharmacy.  Your safety is important to us. If you have drug allergies check your prescription carefully.   For the next 24 hours you can use MyChart to ask questions about today's visit, request a non-urgent call back, or ask for a work or school excuse. You will get an email in the next two days asking about your experience. I hope that your e-visit has been valuable and will speed your recovery.  I have spent 5 minutes in review of e-visit questionnaire, review and updating patient chart, medical decision making and response to patient.   Moreen Piggott, PhD, FNP-BC    

## 2022-05-16 NOTE — Progress Notes (Signed)
Chief Complaint:   OBESITY Tamara Ball is here to discuss her progress with her obesity treatment plan along with follow-up of her obesity related diagnoses. Kyrene is on practicing portion control and making smarter food choices, such as increasing vegetables and decreasing simple carbohydrates and states she is following her eating plan approximately 50% of the time. Tamara Ball states she is doing  Pilates.  Today's visit was #: 29 Starting weight: 205 lbs Starting date: 06/13/2020 Today's weight: 166 lbs Today's date: 04/27/2022 Total lbs lost to date: 39 lbs Total lbs lost since last in-office visit: 0  Interim History: Tamara Ball is up 1 pound since her last visit.  She has several celebrations/Christmas get-togethers over the next week.  Subjective:   1. Impaired fasting glucose Tamara Ball is taking Ozempic 1 mg.  Decreased dose for 2 to 3 weeks and noted increased hunger.  Increase back to Ozempic 1 mg this week.  Denies side effects  Struggled with cravings and hunger on the lower dose.  Assessment/Plan:   1. Impaired fasting glucose We will refill Ozempic 1 mg SQ once weekly for 3 months with 0 refills.  Side effects discussed.   -Refill Semaglutide, 1 MG/DOSE, 4 MG/3ML SOPN; Inject 1 mg into the skin once a week.  Dispense: 9 mL; Refill: 0  2. Obesity, current BMI 26.9 Tamara Ball is currently in the action stage of change. As such, her goal is to maintain her weight. She has agreed to practicing portion control and making smarter food choices, such as increasing vegetables and decreasing simple carbohydrates.   Exercise goals: All adults should avoid inactivity. Some physical activity is better than none, and adults who participate in any amount of physical activity gain some health benefits.  Behavioral modification strategies: increasing lean protein intake, increasing water intake, and holiday eating strategies .  Tamara Ball has agreed to follow-up with our clinic in 6 weeks. She was  informed of the importance of frequent follow-up visits to maximize her success with intensive lifestyle modifications for her multiple health conditions.   Objective:   Blood pressure 105/71, pulse 77, temperature 97.8 F (36.6 C), temperature source Oral, height '5\' 6"'$  (1.676 m), weight 166 lb (75.3 kg), SpO2 100 %. Body mass index is 26.79 kg/m.  General: Cooperative, alert, well developed, in no acute distress. HEENT: Conjunctivae and lids unremarkable. Cardiovascular: Regular rhythm.  Lungs: Normal work of breathing. Neurologic: No focal deficits.   Lab Results  Component Value Date   CREATININE 0.96 01/05/2022   BUN 9 01/05/2022   NA 139 01/05/2022   K 4.5 01/05/2022   CL 105 01/05/2022   CO2 20 01/05/2022   Lab Results  Component Value Date   ALT 7 01/05/2022   AST 15 01/05/2022   ALKPHOS 70 01/05/2022   BILITOT 0.5 01/05/2022   Lab Results  Component Value Date   HGBA1C 5.2 01/05/2022   HGBA1C 5.0 07/13/2020   HGBA1C 5.1 01/27/2020   HGBA1C 4.9 06/17/2019   HGBA1C 5.0 05/27/2018   Lab Results  Component Value Date   INSULIN 6.0 01/05/2022   INSULIN 7.6 07/13/2020   INSULIN 12.6 01/27/2020   INSULIN 9.1 06/17/2019   INSULIN 8.9 06/13/2018   Lab Results  Component Value Date   TSH 3.12 02/24/2021   Lab Results  Component Value Date   CHOL 126 01/05/2022   HDL 62 01/05/2022   LDLCALC 55 01/05/2022   TRIG 31 01/05/2022   CHOLHDL 2.2 07/13/2020   Lab Results  Component Value  Date   VD25OH 37.3 01/05/2022   VD25OH 45.01 02/24/2021   VD25OH 52.3 07/13/2020   Lab Results  Component Value Date   WBC 6.8 01/16/2022   HGB 10.6 (L) 01/16/2022   HCT 35.1 (L) 01/16/2022   MCV 81.4 01/16/2022   PLT 273 01/16/2022   Lab Results  Component Value Date   IRON 12 (L) 01/16/2022   TIBC 511 (H) 01/16/2022   FERRITIN 3 (L) 01/16/2022   Attestation Statements:   Reviewed by clinician on day of visit: allergies, medications, problem list, medical  history, surgical history, family history, social history, and previous encounter notes.  I, Brendell Tyus, RMA, am acting as transcriptionist for Everardo Pacific, FNP.  I have reviewed the above documentation for accuracy and completeness, and I agree with the above. Everardo Pacific, FNP

## 2022-06-02 ENCOUNTER — Other Ambulatory Visit: Payer: Self-pay | Admitting: Family Medicine

## 2022-06-08 ENCOUNTER — Ambulatory Visit: Payer: BC Managed Care – PPO | Admitting: Nurse Practitioner

## 2022-06-14 ENCOUNTER — Encounter: Payer: Self-pay | Admitting: Nurse Practitioner

## 2022-06-14 ENCOUNTER — Ambulatory Visit: Payer: BC Managed Care – PPO | Admitting: Nurse Practitioner

## 2022-06-14 VITALS — BP 121/70 | HR 79 | Temp 98.0°F | Ht 66.0 in | Wt 169.0 lb

## 2022-06-14 DIAGNOSIS — R7989 Other specified abnormal findings of blood chemistry: Secondary | ICD-10-CM

## 2022-06-14 DIAGNOSIS — E669 Obesity, unspecified: Secondary | ICD-10-CM

## 2022-06-14 DIAGNOSIS — E559 Vitamin D deficiency, unspecified: Secondary | ICD-10-CM

## 2022-06-14 DIAGNOSIS — Z6827 Body mass index (BMI) 27.0-27.9, adult: Secondary | ICD-10-CM

## 2022-06-14 DIAGNOSIS — R7301 Impaired fasting glucose: Secondary | ICD-10-CM | POA: Diagnosis not present

## 2022-06-14 NOTE — Progress Notes (Signed)
Office: [phone]  /  Fax: [phone]  WEIGHT SUMMARY AND BIOMETRICS  Medical Weight Loss Height: '5\' 6"'$  (1.676 m) Weight: 169 lb (76.7 kg) Temp: 98 F (36.7 C) Pulse Rate: 79 BP: 121/70 SpO2: 96 % Today's Visit #: 41 Weight at Last VIsit: 166lb Weight Lost Since Last Visit: +3  Body Fat %: 33.8 % Fat Mass (lbs): 57.4 lbs Muscle Mass (lbs): 106.8 lbs Total Body Water (lbs): 79.4 lbs Visceral Fat Rating : 6 Starting Date: 06/13/18 Starting Weight: 205lb Total Weight Loss (lbs): 36 lb (16.3 kg)    HPI  Chief Complaint: OBESITY  Tamara Ball is here to discuss her progress with her obesity treatment plan. She is on the the Category 3 Plan and states she is following her eating plan approximately 50 % of the time. She states she is exercising 0 minutes 0 times per week.   Interval History:  Since last office visit she has gained 3 lbs.  She is taking Ozempic '1mg'$  every 7-9 days.  Finds if she takes it after 7 days her hunger increases.  She does well when she can take it every 7 days.     PHYSICAL EXAM:  Blood pressure 121/70, pulse 79, temperature 98 F (36.7 C), height '5\' 6"'$  (1.676 m), weight 169 lb (76.7 kg), last menstrual period 06/08/2022, SpO2 96 %. Body mass index is 27.28 kg/m.  General: She is overweight, cooperative, alert, well developed, and in no acute distress. PSYCH: Has normal mood, affect and thought process.   Extremities: No edema.  Neurologic: No gross sensory or motor deficits. No tremors or fasciculations noted.    DIAGNOSTIC DATA REVIEWED:  BMET    Component Value Date/Time   NA 139 01/05/2022 1021   K 4.5 01/05/2022 1021   K 4.2 08/11/2016 1404   CL 105 01/05/2022 1021   CL 103 08/11/2016 1404   CO2 20 01/05/2022 1021   CO2 26 08/11/2016 1404   GLUCOSE 80 01/05/2022 1021   GLUCOSE 90 02/24/2021 0720   BUN 9 01/05/2022 1021   CREATININE 0.96 01/05/2022 1021   CREATININE 0.87 08/11/2016 1404   CALCIUM 9.5 01/05/2022 1021   CALCIUM  9.5 08/11/2016 1404   GFRNONAA >60 06/20/2020 0405   GFRAA 83 01/27/2020 0844   Lab Results  Component Value Date   HGBA1C 5.2 01/05/2022   HGBA1C 5.0 04/07/2015   Lab Results  Component Value Date   INSULIN 6.0 01/05/2022   INSULIN 8.9 06/13/2018   Lab Results  Component Value Date   TSH 3.12 02/24/2021   CBC    Component Value Date/Time   WBC 6.8 01/16/2022 0922   WBC 9.4 06/20/2020 0405   RBC 4.31 01/16/2022 0922   RBC 4.31 01/16/2022 0919   HGB 10.6 (L) 01/16/2022 0922   HGB 11.4 01/05/2022 1021   HGB 14.2 04/20/2017 1524   HCT 35.1 (L) 01/16/2022 0922   HCT 36.3 01/05/2022 1021   HCT 43.2 04/20/2017 1524   PLT 273 01/16/2022 0922   PLT 258 01/05/2022 1021   MCV 81.4 01/16/2022 0922   MCV 80 01/05/2022 1021   MCV 90 04/20/2017 1524   MCH 24.6 (L) 01/16/2022 0922   MCHC 30.2 01/16/2022 0922   RDW 14.5 01/16/2022 0922   RDW 13.7 01/05/2022 1021   RDW 13.5 04/20/2017 1524   Iron Studies    Component Value Date/Time   IRON 12 (L) 01/16/2022 0922   IRON 37 01/05/2022 1021   IRON 103 04/20/2017 1524  TIBC 511 (H) 01/16/2022 0922   TIBC 450 (H) 04/20/2017 1524   FERRITIN 3 (L) 01/16/2022 0920   FERRITIN 9 (L) 01/05/2022 1021   FERRITIN 23 04/20/2017 1524   IRONPCTSAT 2 (L) 01/16/2022 0922   IRONPCTSAT 23 04/20/2017 1524   IRONPCTSAT 18 (L) 10/30/2014 1213   Lipid Panel     Component Value Date/Time   CHOL 126 01/05/2022 1021   TRIG 31 01/05/2022 1021   HDL 62 01/05/2022 1021   CHOLHDL 2.2 07/13/2020 1421   CHOLHDL 2 05/27/2018 0848   VLDL 10.4 05/27/2018 0848   LDLCALC 55 01/05/2022 1021   Hepatic Function Panel     Component Value Date/Time   PROT 7.2 01/05/2022 1021   ALBUMIN 4.2 01/05/2022 1021   ALBUMIN 4.2 08/11/2016 1404   AST 15 01/05/2022 1021   AST 9 08/11/2016 1404   ALT 7 01/05/2022 1021   ALT 8 08/11/2016 1404   ALKPHOS 70 01/05/2022 1021   ALKPHOS 58 08/11/2016 1404   BILITOT 0.5 01/05/2022 1021   BILIDIR 0.0 06/24/2012 1055       Component Value Date/Time   TSH 3.12 02/24/2021 0720   Nutritional Lab Results  Component Value Date   VD25OH 37.3 01/05/2022   VD25OH 45.01 02/24/2021   VD25OH 52.3 07/13/2020     ASSESSMENT AND PLAN  TREATMENT PLAN FOR OBESITY:  Recommended Dietary Goals  Tamara Ball is currently in the action stage of change. As such, her goal is to continue weight management plan. She has agreed to the Category 3 Plan.  Behavioral Intervention  We discussed the following Behavioral Modification Strategies today: increasing lean protein intake, increasing vegetables, avoid skipping meals, increase water intake, and think about ways to increase physical activity.  Additional resources provided today: NA  Recommended Physical Activity Goals  Tamara Ball has been advised to work up to 150 minutes of moderate intensity aerobic activity a week and strengthening exercises 2-3 times per week for cardiovascular health, weight loss maintenance and preservation of muscle mass.   She has agreed to increase physical activity in their day and reduce sedentary time (increase NEAT).    ASSOCIATED CONDITIONS ADDRESSED TODAY  High serum vitamin B12 Hasn't been taking Vit B12. Labs obtained today -     Comprehensive metabolic panel; Future -     Vitamin B12; Future  Vitamin D deficiency Taking OTC Vit D. Denies side effects.  -     VITAMIN D 25 Hydroxy (Vit-D Deficiency, Fractures); Future -     Comprehensive metabolic panel; Future  IFG (impaired fasting glucose) She is taking Ozempic '1mg'$  every 7-9 days.  Finds if she takes it after 7 days her hunger increases.  She does well when she can take it every 7 days.  Goal is to be able to come off Ozempic one day and be able to maintain her weight and not have increased hunger.   -     Comprehensive metabolic panel; Future -     Hemoglobin A1c; Future -     Insulin, random; Future  Generalized obesity -     Comprehensive metabolic panel; Future -      TSH; Future  BMI 27.0-27.9,adult      Return in about 5 weeks (around 07/19/2022).Marland Kitchen She was informed of the importance of frequent follow up visits to maximize her success with intensive lifestyle modifications for her multiple health conditions.   ATTESTASTION STATEMENTS:  Reviewed by clinician on day of visit: allergies, medications, problem list, medical history, surgical  history, family history, social history, and previous encounter notes.   Time spent on visit including pre-visit chart review and post-visit care and charting was 30 minutes.    Ailene Rud. Faigy Stretch FNP-C

## 2022-06-26 ENCOUNTER — Encounter: Payer: Self-pay | Admitting: Nurse Practitioner

## 2022-07-11 ENCOUNTER — Other Ambulatory Visit: Payer: Self-pay | Admitting: Family Medicine

## 2022-07-12 NOTE — Telephone Encounter (Signed)
Ok to reflil but pt has not been in to establish with me-- last visit was may 2023 with Dr. Sarajane Jews

## 2022-07-17 ENCOUNTER — Telehealth: Payer: BC Managed Care – PPO | Admitting: Nurse Practitioner

## 2022-07-17 ENCOUNTER — Inpatient Hospital Stay: Payer: BC Managed Care – PPO | Attending: Hematology & Oncology

## 2022-07-17 ENCOUNTER — Inpatient Hospital Stay: Payer: BC Managed Care – PPO | Admitting: Family

## 2022-07-17 VITALS — BP 113/55 | HR 84 | Temp 97.6°F | Resp 16 | Ht 66.0 in | Wt 175.0 lb

## 2022-07-17 DIAGNOSIS — D500 Iron deficiency anemia secondary to blood loss (chronic): Secondary | ICD-10-CM

## 2022-07-17 DIAGNOSIS — J309 Allergic rhinitis, unspecified: Secondary | ICD-10-CM

## 2022-07-17 DIAGNOSIS — N920 Excessive and frequent menstruation with regular cycle: Secondary | ICD-10-CM | POA: Diagnosis not present

## 2022-07-17 LAB — CBC WITH DIFFERENTIAL (CANCER CENTER ONLY)
Abs Immature Granulocytes: 0.02 10*3/uL (ref 0.00–0.07)
Basophils Absolute: 0.1 10*3/uL (ref 0.0–0.1)
Basophils Relative: 1 %
Eosinophils Absolute: 0.5 10*3/uL (ref 0.0–0.5)
Eosinophils Relative: 7 %
HCT: 33.8 % — ABNORMAL LOW (ref 36.0–46.0)
Hemoglobin: 10 g/dL — ABNORMAL LOW (ref 12.0–15.0)
Immature Granulocytes: 0 %
Lymphocytes Relative: 22 %
Lymphs Abs: 1.5 10*3/uL (ref 0.7–4.0)
MCH: 23.1 pg — ABNORMAL LOW (ref 26.0–34.0)
MCHC: 29.6 g/dL — ABNORMAL LOW (ref 30.0–36.0)
MCV: 78.1 fL — ABNORMAL LOW (ref 80.0–100.0)
Monocytes Absolute: 0.5 10*3/uL (ref 0.1–1.0)
Monocytes Relative: 8 %
Neutro Abs: 4.1 10*3/uL (ref 1.7–7.7)
Neutrophils Relative %: 62 %
Platelet Count: 258 10*3/uL (ref 150–400)
RBC: 4.33 MIL/uL (ref 3.87–5.11)
RDW: 15.3 % (ref 11.5–15.5)
WBC Count: 6.7 10*3/uL (ref 4.0–10.5)
nRBC: 0 % (ref 0.0–0.2)

## 2022-07-17 LAB — IRON AND IRON BINDING CAPACITY (CC-WL,HP ONLY)
Iron: 19 ug/dL — ABNORMAL LOW (ref 28–170)
Saturation Ratios: 3 % — ABNORMAL LOW (ref 10.4–31.8)
TIBC: 568 ug/dL — ABNORMAL HIGH (ref 250–450)
UIBC: 549 ug/dL — ABNORMAL HIGH (ref 148–442)

## 2022-07-17 LAB — RETICULOCYTES
Immature Retic Fract: 22.4 % — ABNORMAL HIGH (ref 2.3–15.9)
RBC.: 4.26 MIL/uL (ref 3.87–5.11)
Retic Count, Absolute: 54.1 10*3/uL (ref 19.0–186.0)
Retic Ct Pct: 1.3 % (ref 0.4–3.1)

## 2022-07-17 LAB — FERRITIN: Ferritin: 3 ng/mL — ABNORMAL LOW (ref 11–307)

## 2022-07-17 MED ORDER — LEVOCETIRIZINE DIHYDROCHLORIDE 5 MG PO TABS: 5.0000 mg | ORAL_TABLET | Freq: Every evening | ORAL | 2 refills | Status: DC

## 2022-07-17 MED ORDER — FLUTICASONE PROPIONATE 50 MCG/ACT NA SUSP: 2.0000 | Freq: Every day | NASAL | 6 refills | Status: AC

## 2022-07-17 MED ORDER — MONTELUKAST SODIUM 10 MG PO TABS: 10.0000 mg | ORAL_TABLET | Freq: Every day | ORAL | 2 refills | Status: DC

## 2022-07-17 NOTE — Progress Notes (Signed)
Virtual Visit Consent   Tamara Ball, you are scheduled for a virtual visit with a [facility] provider today. Just as with appointments in the office, your consent must be obtained to participate. Your consent will be active for this visit and any virtual visit you may have with one of our providers in the next 365 days. If you have a MyChart account, a copy of this consent can be sent to you electronically.  As this is a virtual visit, video technology does not allow for your provider to perform a traditional examination. This may limit your provider's ability to fully assess your condition. If your provider identifies any concerns that need to be evaluated in person or the need to arrange testing (such as labs, EKG, etc.), we will make arrangements to do so. Although advances in technology are sophisticated, we cannot ensure that it will always work on either your end or our end. If the connection with a video visit is poor, the visit may have to be switched to a telephone visit. With either a video or telephone visit, we are not always able to ensure that we have a secure connection.  By engaging in this virtual visit, you consent to the provision of healthcare and authorize for your insurance to be billed (if applicable) for the services provided during this visit. Depending on your insurance coverage, you may receive a charge related to this service.  I need to obtain your verbal consent now. Are you willing to proceed with your visit today? Tamara Ball has provided verbal consent on [birth year]2023 for a virtual visit (video or telephone). Apolonio Schneiders, FNP  Date: 07/17/2022 7:06 PM  Virtual Visit via Video Note   I, Apolonio Schneiders, connected with  Tamara Ball  ([id number], [birth year]) on 07/17/22 at  7:15 PM EDT by a video-enabled telemedicine application and verified that I am speaking with the correct person using two identifiers.  Location: Patient: Virtual Visit Location Patient:  Home Provider: Virtual Visit Location Provider: Home Office   I discussed the limitations of evaluation and management by telemedicine and the availability of in person appointments. The patient expressed understanding and agreed to proceed.    History of Present Illness: Tamara Ball is a 42 y.o. who identifies as a female who was assigned female at birth, and is being seen today for assistance in refilling her allergy medications. Her PCP recently left and her practice is no longer providing her with refills  She is a patient with [facility] and is following up there this week  Will inquire about PCP at that time   Problems:  Patient Active Problem List   Diagnosis Date Noted   Impaired fasting glucose 09/29/2021   Genetic testing 07/29/2021   Pneumonia due to COVID-19 virus 06/02/2020   Cough 12/02/2019   Chest congestion 12/02/2019   Migraine 09/19/2019   Generalized obesity 06/17/2018   Menorrhagia    Iron deficiency anemia due to chronic blood loss    Allergic rhinitis 02/05/2014    Allergies: No Known Allergies Medications:  Current Outpatient Medications:    AVIANE 0.1-20 MG-MCG tablet, , Disp: , Rfl:    b complex vitamins tablet, Take 1 tablet by mouth daily., Disp: , Rfl:    baclofen (LIORESAL) 10 MG tablet, Take 1 tablet (10 mg total) by mouth 3 (three) times daily., Disp: 42 each, Rfl: 0   Biotin 10000 MCG TABS, Take 1 tablet by mouth daily., Disp: , Rfl:  diphenhydrAMINE HCl (BENADRYL PO), Take by mouth as needed., Disp: , Rfl:    DYMISTA 137-50 MCG/ACT SUSP, 1 actuation each nostril twice daily (Patient taking differently: as needed. 1 actuation each nostril twice daily), Disp: 23 g, Rfl: 3   Erenumab-aooe (AIMOVIG) 140 MG/ML SOAJ, Inject 140 mg into the skin every 28 (twenty-eight) days., Disp: 1.12 mL, Rfl: 11   fluticasone (FLONASE) 50 MCG/ACT nasal spray, Place 2 sprays into both nostrils daily., Disp: 16 g, Rfl: 5   levocetirizine (XYZAL) 5 MG  tablet, TAKE 1 TABLET BY MOUTH ONCE DAILY IN THE EVENING, Disp: 30 tablet, Rfl: 0   LORazepam (ATIVAN) 0.5 MG tablet, Take 1 tablet (0.5 mg total) by mouth 2 (two) times daily as needed for anxiety., Disp: 30 tablet, Rfl: 1   montelukast (SINGULAIR) 10 MG tablet, TAKE 1 TABLET BY MOUTH AT BEDTIME . APPOINTMENT REQUIRED FOR FUTURE REFILLS, Disp: 30 tablet, Rfl: 0   Multiple Vitamin (MULTIVITAMIN WITH MINERALS) TABS tablet, Take 1 tablet by mouth daily., Disp: , Rfl:    naproxen sodium (ANAPROX DS) 550 MG tablet, Take 1 tablet (550 mg total) by mouth 2 (two) times daily with a meal., Disp: 60 tablet, Rfl: 0   Omega-3 Fatty Acids (FISH OIL) 1000 MG CAPS, Take 1 capsule by mouth daily., Disp: , Rfl:    ondansetron (ZOFRAN-ODT) 4 MG disintegrating tablet, Take 1 tablet (4 mg total) by mouth every 8 (eight) hours as needed for nausea or vomiting., Disp: 20 tablet, Rfl: 5   polyethylene glycol (MIRALAX / GLYCOLAX) 17 g packet, Take 17 g by mouth daily as needed., Disp: , Rfl:    Semaglutide, 1 MG/DOSE, 4 MG/3ML SOPN, Inject 1 mg into the skin once a week., Disp: 9 mL, Rfl: 0   VITAMIN D PO, Take 10,000 Units by mouth once a week., Disp: , Rfl:   Observations/Objective: Patient is well-developed, well-nourished in no acute distress.  Resting comfortably  at home.  Head is normocephalic, atraumatic.  No labored breathing.  Speech is clear and coherent with logical content.  Patient is alert and oriented at baseline.    Assessment and Plan: 1. Allergic rhinitis, unspecified seasonality, unspecified trigger  - fluticasone (FLONASE) 50 MCG/ACT nasal spray; Place 2 sprays into both nostrils daily.  Dispense: 16 g; Refill: 6 - montelukast (SINGULAIR) 10 MG tablet; Take 1 tablet (10 mg total) by mouth at bedtime.  Dispense: 30 tablet; Refill: 2 - levocetirizine (XYZAL) 5 MG tablet; Take 1 tablet (5 mg total) by mouth every evening.  Dispense: 30 tablet; Refill: 2    Discussed how to schedule PCP  appointment from [facility] website as well if needed  Follow Up Instructions: I discussed the assessment and treatment plan with the patient. The patient was provided an opportunity to ask questions and all were answered. The patient agreed with the plan and demonstrated an understanding of the instructions.  A copy of instructions were sent to the patient via MyChart unless otherwise noted below.    The patient was advised to call back or seek an in-person evaluation if the symptoms worsen or if the condition fails to improve as anticipated.  Time:  I spent 15 minutes with the patient via telehealth technology discussing the above problems/concerns.    Apolonio Schneiders, FNP

## 2022-07-17 NOTE — Progress Notes (Signed)
Hematology and Oncology Follow Up Visit  Tamara Ball [id number] [birth year] 42 y.o. 07/17/2022   Principle Diagnosis:  Iron deficiency anemia secondary to menorrhagia    Current Therapy:        IV iron as indicated    Interim History:  Tamara Ball is here today for follow-up. She is symptomatic with fatigue and restless legs.  Her cycle is regular with heavy flow. No other blood loss noted.  No abnormal bruising, no petechiae.  No fever, chills, n/v, cough, rash, dizziness, SOB, abdominal pain or changes in bowel or bladder habits.  She has intermittent chest discomfort, palpitations and tingling in the left arm. She states that she has not felt the need so far to go to the ED but will discuss with her PCP.  No swelling, tenderness, numbness or tingling in her extremities at this time.  No falls or syncope.  Appetite and hydration are good. Weight is stable at this time.   ECOG Performance Status: 1 - Symptomatic but completely ambulatory  Medications:  Allergies as of 07/17/2022   No Known Allergies      Medication List        Accurate as of July 17, 2022 11:00 AM. If you have any questions, ask your nurse or doctor.          Aimovig 140 MG/ML Soaj Generic drug: Erenumab-aooe Inject 140 mg into the skin every 28 (twenty-eight) days.   Aviane 0.1-20 MG-MCG tablet Generic drug: levonorgestrel-ethinyl estradiol   b complex vitamins tablet Take 1 tablet by mouth daily.   baclofen 10 MG tablet Commonly known as: LIORESAL Take 1 tablet (10 mg total) by mouth 3 (three) times daily.   BENADRYL PO Take by mouth as needed.   Biotin 10000 MCG Tabs Take 1 tablet by mouth daily.   Dymista 137-50 MCG/ACT Susp Generic drug: Azelastine-Fluticasone 1 actuation each nostril twice daily What changed:  when to take this reasons to take this   Fish Oil 1000 MG Caps Take 1 capsule by mouth daily.   fluticasone 50 MCG/ACT nasal spray Commonly known as: FLONASE Place 2  sprays into both nostrils daily.   levocetirizine 5 MG tablet Commonly known as: XYZAL TAKE 1 TABLET BY MOUTH ONCE DAILY IN THE EVENING   LORazepam 0.5 MG tablet Commonly known as: ATIVAN Take 1 tablet (0.5 mg total) by mouth 2 (two) times daily as needed for anxiety.   montelukast 10 MG tablet Commonly known as: SINGULAIR TAKE 1 TABLET BY MOUTH AT BEDTIME . APPOINTMENT REQUIRED FOR FUTURE REFILLS   multivitamin with minerals Tabs tablet Take 1 tablet by mouth daily.   naproxen sodium 550 MG tablet Commonly known as: Anaprox DS Take 1 tablet (550 mg total) by mouth 2 (two) times daily with a meal.   ondansetron 4 MG disintegrating tablet Commonly known as: ZOFRAN-ODT Take 1 tablet (4 mg total) by mouth every 8 (eight) hours as needed for nausea or vomiting.   polyethylene glycol 17 g packet Commonly known as: MIRALAX / GLYCOLAX Take 17 g by mouth daily as needed.   Semaglutide (1 MG/DOSE) 4 MG/3ML Sopn Inject 1 mg into the skin once a week.   VITAMIN D PO Take 10,000 Units by mouth once a week.        Allergies: No Known Allergies  Past Medical History, Surgical history, Social history, and Family History were reviewed and updated.  Review of Systems: All other 10 point review of systems is negative.   Physical  Exam:  height is '5\' 6"'$  (1.676 m) and weight is 175 lb (79.4 kg). Her oral temperature is 97.6 F (36.4 C). Her blood pressure is 113/55 (abnormal) and her pulse is 84. Her respiration is 16 and oxygen saturation is 100%.   Wt Readings from Last 3 Encounters:  07/17/22 175 lb (79.4 kg)  06/14/22 169 lb (76.7 kg)  04/27/22 166 lb (75.3 kg)    Ocular: Sclerae unicteric, pupils equal, round and reactive to light Ear-nose-throat: Oropharynx clear, dentition fair Lymphatic: No cervical or supraclavicular adenopathy Lungs no rales or rhonchi, good excursion bilaterally Heart regular rate and rhythm, no murmur appreciated Abd soft, nontender, positive bowel  sounds MSK no focal spinal tenderness, no joint edema Neuro: non-focal, well-oriented, appropriate affect Breasts: Deferred   Lab Results  Component Value Date   WBC 6.7 07/17/2022   HGB 10.0 (L) 07/17/2022   HCT 33.8 (L) 07/17/2022   MCV 78.1 (L) 07/17/2022   PLT 258 07/17/2022   Lab Results  Component Value Date   FERRITIN 3 (L) 01/16/2022   IRON 12 (L) 01/16/2022   TIBC 511 (H) 01/16/2022   UIBC 499 (H) 01/16/2022   IRONPCTSAT 2 (L) 01/16/2022   Lab Results  Component Value Date   RETICCTPCT 1.3 07/17/2022   RBC 4.26 07/17/2022   RBC 4.33 07/17/2022   RETICCTABS 64,400 05/16/2016   No results found for: "KPAFRELGTCHN", "LAMBDASER", "KAPLAMBRATIO" No results found for: "IGGSERUM", "IGA", "IGMSERUM" No results found for: "TOTALPROTELP", "ALBUMINELP", "A1GS", "A2GS", "BETS", "BETA2SER", "GAMS", "MSPIKE", "SPEI"   Chemistry      Component Value Date/Time   NA 139 01/05/2022 1021   K 4.5 01/05/2022 1021   K 4.2 08/11/2016 1404   CL 105 01/05/2022 1021   CL 103 08/11/2016 1404   CO2 20 01/05/2022 1021   CO2 26 08/11/2016 1404   BUN 9 01/05/2022 1021   CREATININE 0.96 01/05/2022 1021   CREATININE 0.87 08/11/2016 1404      Component Value Date/Time   CALCIUM 9.5 01/05/2022 1021   CALCIUM 9.5 08/11/2016 1404   ALKPHOS 70 01/05/2022 1021   ALKPHOS 58 08/11/2016 1404   AST 15 01/05/2022 1021   AST 9 08/11/2016 1404   ALT 7 01/05/2022 1021   ALT 8 08/11/2016 1404   BILITOT 0.5 01/05/2022 1021       Impression and Plan: Tamara Ball is a very pleasant 42 yo African American female with iron deficiency secondary to heavy cycles.  Iron studies are pending. We will replace if needed.  Follow-up in 6 months.   Lottie Dawson, NP 3/11/202411:00 AM

## 2022-07-18 ENCOUNTER — Telehealth: Payer: Self-pay | Admitting: *Deleted

## 2022-07-18 NOTE — Telephone Encounter (Signed)
Per scheduling message Judson Roch - Called patient and lvm for a call back to schedule (2) doses of IV Iron.

## 2022-07-19 ENCOUNTER — Ambulatory Visit: Payer: BC Managed Care – PPO | Admitting: Nurse Practitioner

## 2022-07-19 ENCOUNTER — Encounter: Payer: Self-pay | Admitting: Nurse Practitioner

## 2022-07-19 VITALS — BP 121/70 | HR 72 | Temp 97.9°F | Ht 66.0 in | Wt 172.0 lb

## 2022-07-19 DIAGNOSIS — R5383 Other fatigue: Secondary | ICD-10-CM

## 2022-07-19 DIAGNOSIS — R7301 Impaired fasting glucose: Secondary | ICD-10-CM | POA: Diagnosis not present

## 2022-07-19 DIAGNOSIS — Z6827 Body mass index (BMI) 27.0-27.9, adult: Secondary | ICD-10-CM

## 2022-07-19 DIAGNOSIS — E559 Vitamin D deficiency, unspecified: Secondary | ICD-10-CM | POA: Diagnosis not present

## 2022-07-19 DIAGNOSIS — E669 Obesity, unspecified: Secondary | ICD-10-CM

## 2022-07-19 DIAGNOSIS — R7989 Other specified abnormal findings of blood chemistry: Secondary | ICD-10-CM | POA: Diagnosis not present

## 2022-07-19 MED ORDER — SEMAGLUTIDE (1 MG/DOSE) 4 MG/3ML ~~LOC~~ SOPN: 1.0000 mg | PEN_INJECTOR | SUBCUTANEOUS | 0 refills | Status: DC

## 2022-07-19 NOTE — Progress Notes (Signed)
Office: [phone]  /  Fax: [phone]  WEIGHT SUMMARY AND BIOMETRICS  Weight Lost Since Last Visit: 0lb  No data recorded  Vitals Temp: 97.9 F (36.6 C) BP: 121/70 Pulse Rate: 72 SpO2: 100 %   Anthropometric Measurements Height: '5\' 6"'$  (1.676 m) Weight: 172 lb (78 kg) BMI (Calculated): 27.77 Weight at Last Visit: 169lb Weight Lost Since Last Visit: 0lb Starting Weight: 205lb Total Weight Loss (lbs): 33 lb (15 kg)   Body Composition  Body Fat %: 36.1 % Fat Mass (lbs): 62.2 lbs Muscle Mass (lbs): 104.6 lbs Total Body Water (lbs): 76.6 lbs Visceral Fat Rating : 7   Other Clinical Data Today's Visit #: 22 Starting Date: 06/13/18     HPI  Chief Complaint: OBESITY  Tamara Ball is here to discuss her progress with her obesity treatment plan. She is on the the Category 3 Plan and states she is following her eating plan approximately 50 % of the time. She states she is exercising 0 minutes 0 days per week.   Interval History:  Since last office visit she has gained 3 pounds.  She is taking Ozempic '1mg'$  every 10 days.  Denies side effects.  Notes some increased hunger around day 7 after taking Ozempic and feels this is why she gained 3 pounds.  She is happy with her current weight.  She started exercising this week.    PHYSICAL EXAM:  Blood pressure 121/70, pulse 72, temperature 97.9 F (36.6 C), height '5\' 6"'$  (1.676 m), weight 172 lb (78 kg), last menstrual period 07/03/2022, SpO2 100 %. Body mass index is 27.76 kg/m.  General: She is overweight, cooperative, alert, well developed, and in no acute distress. PSYCH: Has normal mood, affect and thought process.   Extremities: No edema.  Neurologic: No gross sensory or motor deficits. No tremors or fasciculations noted.    DIAGNOSTIC DATA REVIEWED:  BMET    Component Value Date/Time   NA 139 01/05/2022 1021   K 4.5 01/05/2022 1021   K 4.2 08/11/2016 1404   CL 105 01/05/2022 1021   CL 103 08/11/2016 1404    CO2 20 01/05/2022 1021   CO2 26 08/11/2016 1404   GLUCOSE 80 01/05/2022 1021   GLUCOSE 90 02/24/2021 0720   BUN 9 01/05/2022 1021   CREATININE 0.96 01/05/2022 1021   CREATININE 0.87 08/11/2016 1404   CALCIUM 9.5 01/05/2022 1021   CALCIUM 9.5 08/11/2016 1404   GFRNONAA >60 06/20/2020 0405   GFRAA 83 01/27/2020 0844   Lab Results  Component Value Date   HGBA1C 5.2 01/05/2022   HGBA1C 5.0 04/07/2015   Lab Results  Component Value Date   INSULIN 6.0 01/05/2022   INSULIN 8.9 06/13/2018   Lab Results  Component Value Date   TSH 3.12 02/24/2021   CBC    Component Value Date/Time   WBC 6.7 07/17/2022 0959   WBC 9.4 06/20/2020 0405   RBC 4.26 07/17/2022 0959   RBC 4.33 07/17/2022 0959   HGB 10.0 (L) 07/17/2022 0959   HGB 11.4 01/05/2022 1021   HGB 14.2 04/20/2017 1524   HCT 33.8 (L) 07/17/2022 0959   HCT 36.3 01/05/2022 1021   HCT 43.2 04/20/2017 1524   PLT 258 07/17/2022 0959   PLT 258 01/05/2022 1021   MCV 78.1 (L) 07/17/2022 0959   MCV 80 01/05/2022 1021   MCV 90 04/20/2017 1524   MCH 23.1 (L) 07/17/2022 0959   MCHC 29.6 (L) 07/17/2022 0959   RDW 15.3 07/17/2022 0959  RDW 13.7 01/05/2022 1021   RDW 13.5 04/20/2017 1524   Iron Studies    Component Value Date/Time   IRON 19 (L) 07/17/2022 0959   IRON 37 01/05/2022 1021   IRON 103 04/20/2017 1524   TIBC 568 (H) 07/17/2022 0959   TIBC 450 (H) 04/20/2017 1524   FERRITIN 3 (L) 07/17/2022 0959   FERRITIN 9 (L) 01/05/2022 1021   FERRITIN 23 04/20/2017 1524   IRONPCTSAT 3 (L) 07/17/2022 0959   IRONPCTSAT 23 04/20/2017 1524   IRONPCTSAT 18 (L) 10/30/2014 1213   Lipid Panel     Component Value Date/Time   CHOL 126 01/05/2022 1021   TRIG 31 01/05/2022 1021   HDL 62 01/05/2022 1021   CHOLHDL 2.2 07/13/2020 1421   CHOLHDL 2 05/27/2018 0848   VLDL 10.4 05/27/2018 0848   LDLCALC 55 01/05/2022 1021   Hepatic Function Panel     Component Value Date/Time   PROT 7.2 01/05/2022 1021   ALBUMIN 4.2 01/05/2022 1021    ALBUMIN 4.2 08/11/2016 1404   AST 15 01/05/2022 1021   AST 9 08/11/2016 1404   ALT 7 01/05/2022 1021   ALT 8 08/11/2016 1404   ALKPHOS 70 01/05/2022 1021   ALKPHOS 58 08/11/2016 1404   BILITOT 0.5 01/05/2022 1021   BILIDIR 0.0 06/24/2012 1055      Component Value Date/Time   TSH 3.12 02/24/2021 0720   Nutritional Lab Results  Component Value Date   VD25OH 37.3 01/05/2022   VD25OH 45.01 02/24/2021   VD25OH 52.3 07/13/2020     ASSESSMENT AND PLAN  TREATMENT PLAN FOR OBESITY:  Recommended Dietary Goals  Marjie is currently in the action stage of change. As such, her goal is to continue weight management plan. She has agreed to the Category 3 Plan.  Behavioral Intervention  We discussed the following Behavioral Modification Strategies today: increasing lean protein intake, decreasing simple carbohydrates , increasing vegetables, avoiding skipping meals, increasing water intake, and work on meal planning and easy cooking plans.  Additional resources provided today: NA  Recommended Physical Activity Goals  Analecia has been advised to work up to 150 minutes of moderate intensity aerobic activity a week and strengthening exercises 2-3 times per week for cardiovascular health, weight loss maintenance and preservation of muscle mass.   She has agreed to continue physical activity as is.   ASSOCIATED CONDITIONS ADDRESSED TODAY  Action/Plan  Impaired fasting glucose -     Comprehensive metabolic panel -     Hemoglobin A1c -     Insulin, random -     Semaglutide (1 MG/DOSE); Inject 1 mg into the skin once a week.  Dispense: 9 mL; Refill: 0  Briselda will continue to work on exercise, and decreasing simple carbohydrates to help decrease the risk of diabetes. Brandilyn agreed to follow-up with [name] as directed to closely monitor her progress.   High serum vitamin B12 -     Comprehensive metabolic panel -     Vitamin B12  Vitamin D deficiency -     Comprehensive metabolic  panel -     VITAMIN D 25 Hydroxy (Vit-D Deficiency, Fractures) She is taking Vit D weekly.  Denies side effects.  Will continue to monitor.   Fatigue, unspecified type -     TSH Seeing oncology for anemia.  Is not sleeping well due to RLS.  Scheduled for iron infusions.   Continue to follow up with GYN and oncology.   Generalized obesity -     Comprehensive  metabolic panel -     TSH  BMI 27.0-27.9,adult -     Comprehensive metabolic panel -     TSH         Return in about 6 weeks (around 08/30/2022).Marland Kitchen She was informed of the importance of frequent follow up visits to maximize her success with intensive lifestyle modifications for her multiple health conditions.   ATTESTASTION STATEMENTS:  Reviewed by clinician on day of visit: allergies, medications, problem list, medical history, surgical history, family history, social history, and previous encounter notes.     Ailene Rud. Srija Southard FNP-C

## 2022-07-20 LAB — COMPREHENSIVE METABOLIC PANEL
ALT: 15 IU/L (ref 0–32)
AST: 16 IU/L (ref 0–40)
Albumin/Globulin Ratio: 1.7 (ref 1.2–2.2)
Albumin: 4.1 g/dL (ref 3.9–4.9)
Alkaline Phosphatase: 69 IU/L (ref 44–121)
BUN/Creatinine Ratio: 13 (ref 9–23)
BUN: 11 mg/dL (ref 6–24)
Bilirubin Total: 0.3 mg/dL (ref 0.0–1.2)
CO2: 21 mmol/L (ref 20–29)
Calcium: 9.1 mg/dL (ref 8.7–10.2)
Chloride: 108 mmol/L — ABNORMAL HIGH (ref 96–106)
Creatinine, Ser: 0.82 mg/dL (ref 0.57–1.00)
Globulin, Total: 2.4 g/dL (ref 1.5–4.5)
Glucose: 80 mg/dL (ref 70–99)
Potassium: 4.2 mmol/L (ref 3.5–5.2)
Sodium: 142 mmol/L (ref 134–144)
Total Protein: 6.5 g/dL (ref 6.0–8.5)
eGFR: 91 mL/min/{1.73_m2} (ref >59)

## 2022-07-20 LAB — TSH: TSH: 2.17 u[IU]/mL (ref 0.450–4.500)

## 2022-07-20 LAB — HEMOGLOBIN A1C
Est. average glucose Bld gHb Est-mCnc: 108 mg/dL
Hgb A1c MFr Bld: 5.4 % (ref 4.8–5.6)

## 2022-07-20 LAB — VITAMIN D 25 HYDROXY (VIT D DEFICIENCY, FRACTURES): Vit D, 25-Hydroxy: 51.3 ng/mL (ref 30.0–100.0)

## 2022-07-20 LAB — VITAMIN B12: Vitamin B-12: 1182 pg/mL (ref 232–1245)

## 2022-07-20 LAB — INSULIN, RANDOM: INSULIN: 10.2 u[IU]/mL (ref 2.6–24.9)

## 2022-07-25 ENCOUNTER — Telehealth: Payer: BC Managed Care – PPO | Admitting: Family Medicine

## 2022-07-25 ENCOUNTER — Inpatient Hospital Stay: Payer: BC Managed Care – PPO

## 2022-07-25 VITALS — BP 109/55 | HR 74 | Temp 98.2°F | Resp 18

## 2022-07-25 DIAGNOSIS — R3989 Other symptoms and signs involving the genitourinary system: Secondary | ICD-10-CM | POA: Diagnosis not present

## 2022-07-25 DIAGNOSIS — N920 Excessive and frequent menstruation with regular cycle: Secondary | ICD-10-CM

## 2022-07-25 DIAGNOSIS — D500 Iron deficiency anemia secondary to blood loss (chronic): Secondary | ICD-10-CM

## 2022-07-25 MED ORDER — SODIUM CHLORIDE 0.9 % IV SOLN
125.0000 mg | Freq: Once | INTRAVENOUS | Status: AC
  Administered 2022-07-25: 125 mg via INTRAVENOUS
  Filled 2022-07-25: qty 10

## 2022-07-25 MED ORDER — NITROFURANTOIN MONOHYD MACRO 100 MG PO CAPS: 100.0000 mg | ORAL_CAPSULE | Freq: Two times a day (BID) | ORAL | 0 refills | Status: AC

## 2022-07-25 MED ORDER — SODIUM CHLORIDE 0.9 % IV SOLN: Freq: Once | INTRAVENOUS | Status: AC

## 2022-07-25 NOTE — Progress Notes (Signed)

## 2022-07-25 NOTE — Progress Notes (Signed)
0940-Pt refused to stay for 30 minutes post iron infusion and is without complaints at time of discharge.

## 2022-07-25 NOTE — Patient Instructions (Signed)
Sodium Ferric Gluconate Complex Injection What is this medication? SODIUM FERRIC GLUCONATE COMPLEX (SOE dee um FER ik GLOO koe nate KOM pleks) treats low levels of iron (iron deficiency anemia) in people with kidney disease. Iron is a mineral that plays an important role in making red blood cells, which carry oxygen from your lungs to the rest of your body. This medicine may be used for other purposes; ask your health care provider or pharmacist if you have questions. COMMON BRAND NAME(S): Ferrlecit, Nulecit What should I tell my care team before I take this medication? They need to know if you have any of the following conditions: Anemia that is not from iron deficiency High levels of iron in the blood An unusual or allergic reaction to iron, other medications, foods, dyes, or preservatives Pregnant or are trying to become pregnant Breast-feeding How should I use this medication? This medication is injected into a vein. It is given by your care team in a hospital or clinic setting. Talk to your care team about the use of this medication in children. While it may be prescribed for children as young as 6 years for selected conditions, precautions do apply. Overdosage: If you think you have taken too much of this medicine contact a poison control center or emergency room at once. NOTE: This medicine is only for you. Do not share this medicine with others. What if I miss a dose? It is important not to miss your dose. Call your care team if you are unable to keep an appointment. What may interact with this medication? Do not take this medication with any of the following: Deferasirox Deferoxamine Dimercaprol This medication may also interact with the following: Other iron products This list may not describe all possible interactions. Give your health care provider a list of all the medicines, herbs, non-prescription drugs, or dietary supplements you use. Also tell them if you smoke, drink  alcohol, or use illegal drugs. Some items may interact with your medicine. What should I watch for while using this medication? Your condition will be monitored carefully while you are receiving this medication. Visit your care team for regular checks on your progress. You may need blood work while you are taking this medication. What side effects may I notice from receiving this medication? Side effects that you should report to your care team as soon as possible: Allergic reactions--skin rash, itching, hives, swelling of the face, lips, tongue, or throat Low blood pressure--dizziness, feeling faint or lightheaded, blurry vision Shortness of breath Side effects that usually do not require medical attention (report to your care team if they continue or are bothersome): Flushing Headache Joint pain Muscle pain Nausea Pain, redness, or irritation at injection site This list may not describe all possible side effects. Call your doctor for medical advice about side effects. You may report side effects to FDA at 1-800-FDA-1088. Where should I keep my medication? This medication is given in a hospital or clinic and will not be stored at home. NOTE: This sheet is a summary. It may not cover all possible information. If you have questions about this medicine, talk to your doctor, pharmacist, or health care provider.  2023 Elsevier/Gold Standard (2020-09-17 00:00:00)  

## 2022-08-01 ENCOUNTER — Inpatient Hospital Stay: Payer: BC Managed Care – PPO

## 2022-08-01 VITALS — BP 104/57 | HR 64 | Temp 98.2°F | Resp 18

## 2022-08-01 DIAGNOSIS — D500 Iron deficiency anemia secondary to blood loss (chronic): Secondary | ICD-10-CM

## 2022-08-01 DIAGNOSIS — N920 Excessive and frequent menstruation with regular cycle: Secondary | ICD-10-CM

## 2022-08-01 MED ORDER — SODIUM CHLORIDE 0.9 % IV SOLN
125.0000 mg | Freq: Once | INTRAVENOUS | Status: AC
  Administered 2022-08-01: 125 mg via INTRAVENOUS
  Filled 2022-08-01: qty 125

## 2022-08-01 MED ORDER — SODIUM CHLORIDE 0.9 % IV SOLN: Freq: Once | INTRAVENOUS | Status: AC

## 2022-08-01 NOTE — Patient Instructions (Signed)
Sodium Ferric Gluconate Complex Injection What is this medication? SODIUM FERRIC GLUCONATE COMPLEX (SOE dee um FER ik GLOO koe nate KOM pleks) treats low levels of iron (iron deficiency anemia) in people with kidney disease. Iron is a mineral that plays an important role in making red blood cells, which carry oxygen from your lungs to the rest of your body. This medicine may be used for other purposes; ask your health care provider or pharmacist if you have questions. COMMON BRAND NAME(S): Ferrlecit, Nulecit What should I tell my care team before I take this medication? They need to know if you have any of the following conditions: Anemia that is not from iron deficiency High levels of iron in the blood An unusual or allergic reaction to iron, other medications, foods, dyes, or preservatives Pregnant or are trying to become pregnant Breast-feeding How should I use this medication? This medication is injected into a vein. It is given by your care team in a hospital or clinic setting. Talk to your care team about the use of this medication in children. While it may be prescribed for children as young as 6 years for selected conditions, precautions do apply. Overdosage: If you think you have taken too much of this medicine contact a poison control center or emergency room at once. NOTE: This medicine is only for you. Do not share this medicine with others. What if I miss a dose? It is important not to miss your dose. Call your care team if you are unable to keep an appointment. What may interact with this medication? Do not take this medication with any of the following: Deferasirox Deferoxamine Dimercaprol This medication may also interact with the following: Other iron products This list may not describe all possible interactions. Give your health care provider a list of all the medicines, herbs, non-prescription drugs, or dietary supplements you use. Also tell them if you smoke, drink  alcohol, or use illegal drugs. Some items may interact with your medicine. What should I watch for while using this medication? Your condition will be monitored carefully while you are receiving this medication. Visit your care team for regular checks on your progress. You may need blood work while you are taking this medication. What side effects may I notice from receiving this medication? Side effects that you should report to your care team as soon as possible: Allergic reactions--skin rash, itching, hives, swelling of the face, lips, tongue, or throat Low blood pressure--dizziness, feeling faint or lightheaded, blurry vision Shortness of breath Side effects that usually do not require medical attention (report to your care team if they continue or are bothersome): Flushing Headache Joint pain Muscle pain Nausea Pain, redness, or irritation at injection site This list may not describe all possible side effects. Call your doctor for medical advice about side effects. You may report side effects to FDA at 1-800-FDA-1088. Where should I keep my medication? This medication is given in a hospital or clinic and will not be stored at home. NOTE: This sheet is a summary. It may not cover all possible information. If you have questions about this medicine, talk to your doctor, pharmacist, or health care provider.  2023 Elsevier/Gold Standard (2020-09-17 00:00:00)  

## 2022-08-14 ENCOUNTER — Other Ambulatory Visit: Payer: Self-pay | Admitting: Nurse Practitioner

## 2022-08-14 ENCOUNTER — Encounter: Payer: Self-pay | Admitting: Nurse Practitioner

## 2022-08-14 DIAGNOSIS — R7301 Impaired fasting glucose: Secondary | ICD-10-CM

## 2022-08-15 ENCOUNTER — Telehealth: Payer: Self-pay

## 2022-08-15 NOTE — Telephone Encounter (Signed)
Received fax from [facility]: Ozempic has been denied. Covered use for this medication is only if the patient has type 2 DM.

## 2022-08-31 ENCOUNTER — Encounter: Payer: Self-pay | Admitting: Nurse Practitioner

## 2022-08-31 ENCOUNTER — Ambulatory Visit: Payer: BC Managed Care – PPO | Admitting: Nurse Practitioner

## 2022-08-31 VITALS — BP 109/54 | HR 68 | Temp 97.7°F | Ht 66.0 in | Wt 174.0 lb

## 2022-08-31 DIAGNOSIS — R7301 Impaired fasting glucose: Secondary | ICD-10-CM

## 2022-08-31 DIAGNOSIS — E669 Obesity, unspecified: Secondary | ICD-10-CM | POA: Diagnosis not present

## 2022-08-31 DIAGNOSIS — Z6828 Body mass index (BMI) 28.0-28.9, adult: Secondary | ICD-10-CM | POA: Diagnosis not present

## 2022-08-31 NOTE — Progress Notes (Signed)
Office: [phone]  /  Fax: [phone]  WEIGHT SUMMARY AND BIOMETRICS  Weight Gained Since Last Visit: 2lb   Vitals Temp: 97.7 F (36.5 C) BP: (!) 109/54 Pulse Rate: 68 SpO2: 100 %   Anthropometric Measurements Height: 5\' 6"  (1.676 m) Weight: 174 lb (78.9 kg) BMI (Calculated): 28.1 Weight at Last Visit: 172lb Weight Lost Since Last Visit: 0 Weight Gained Since Last Visit: 2lb Starting Weight: 205lb Total Weight Loss (lbs): 31 lb (14.1 kg)   Body Composition  Body Fat %: 36.2 % Fat Mass (lbs): 63.2 lbs Muscle Mass (lbs): 106 lbs Total Body Water (lbs): 77.2 lbs Visceral Fat Rating : 7   Other Clinical Data Fasting: no Labs: no Today's Visit #: 74 Starting Date: 06/13/18     HPI  Chief Complaint: OBESITY  Tamara Ball is here to discuss her progress with her obesity treatment plan. She is on the the Category 3 Plan and states she is following her eating plan approximately 80 % of the time. She states she is exercising 40 minutes 5 days per week.   Interval History:  Since last office visit she has gained 2 pounds.  She has been taking Ozempic for IFG and was doing well but had to stop 2-3 weeks ago due to cost. Since stopping Ozempic she is struggling with hunger and cravings.  She took Saxenda in the past and stopped due to side effects of nausea and poor appetite. She also took Wellbutrin but I'm not sure why she stopped taking it.  She took Topamax in the past for migraine.   She has questions today about additional treatment options.     PHYSICAL EXAM:  Blood pressure (!) 109/54, pulse 68, temperature 97.7 F (36.5 C), height 5\' 6"  (1.676 m), weight 174 lb (78.9 kg), SpO2 100 %. Body mass index is 28.08 kg/m.  General: She is overweight, cooperative, alert, well developed, and in no acute distress. PSYCH: Has normal mood, affect and thought process.   Extremities: No edema.  Neurologic: No gross sensory or motor deficits. No tremors or  fasciculations noted.    DIAGNOSTIC DATA REVIEWED:  BMET    Component Value Date/Time   NA 142 07/19/2022 0847   K 4.2 07/19/2022 0847   K 4.2 08/11/2016 1404   CL 108 (H) 07/19/2022 0847   CL 103 08/11/2016 1404   CO2 21 07/19/2022 0847   CO2 26 08/11/2016 1404   GLUCOSE 80 07/19/2022 0847   GLUCOSE 90 02/24/2021 0720   BUN 11 07/19/2022 0847   CREATININE 0.82 07/19/2022 0847   CREATININE 0.87 08/11/2016 1404   CALCIUM 9.1 07/19/2022 0847   CALCIUM 9.5 08/11/2016 1404   GFRNONAA >60 06/20/2020 0405   GFRAA 83 01/27/2020 0844   Lab Results  Component Value Date   HGBA1C 5.4 07/19/2022   HGBA1C 5.0 04/07/2015   Lab Results  Component Value Date   INSULIN 10.2 07/19/2022   INSULIN 8.9 06/13/2018   Lab Results  Component Value Date   TSH 2.170 07/19/2022   CBC    Component Value Date/Time   WBC 6.7 07/17/2022 0959   WBC 9.4 06/20/2020 0405   RBC 4.26 07/17/2022 0959   RBC 4.33 07/17/2022 0959   HGB 10.0 (L) 07/17/2022 0959   HGB 11.4 01/05/2022 1021   HGB 14.2 04/20/2017 1524   HCT 33.8 (L) 07/17/2022 0959   HCT 36.3 01/05/2022 1021   HCT 43.2 04/20/2017 1524   PLT 258 07/17/2022 0959   PLT 258 01/05/2022  1021   MCV 78.1 (L) 07/17/2022 0959   MCV 80 01/05/2022 1021   MCV 90 04/20/2017 1524   MCH 23.1 (L) 07/17/2022 0959   MCHC 29.6 (L) 07/17/2022 0959   RDW 15.3 07/17/2022 0959   RDW 13.7 01/05/2022 1021   RDW 13.5 04/20/2017 1524   Iron Studies    Component Value Date/Time   IRON 19 (L) 07/17/2022 0959   IRON 37 01/05/2022 1021   IRON 103 04/20/2017 1524   TIBC 568 (H) 07/17/2022 0959   TIBC 450 (H) 04/20/2017 1524   FERRITIN 3 (L) 07/17/2022 0959   FERRITIN 9 (L) 01/05/2022 1021   FERRITIN 23 04/20/2017 1524   IRONPCTSAT 3 (L) 07/17/2022 0959   IRONPCTSAT 23 04/20/2017 1524   IRONPCTSAT 18 (L) 10/30/2014 1213   Lipid Panel     Component Value Date/Time   CHOL 126 01/05/2022 1021   TRIG 31 01/05/2022 1021   HDL 62 01/05/2022 1021    CHOLHDL 2.2 07/13/2020 1421   CHOLHDL 2 05/27/2018 0848   VLDL 10.4 05/27/2018 0848   LDLCALC 55 01/05/2022 1021   Hepatic Function Panel     Component Value Date/Time   PROT 6.5 07/19/2022 0847   ALBUMIN 4.1 07/19/2022 0847   ALBUMIN 4.2 08/11/2016 1404   AST 16 07/19/2022 0847   AST 9 08/11/2016 1404   ALT 15 07/19/2022 0847   ALT 8 08/11/2016 1404   ALKPHOS 69 07/19/2022 0847   ALKPHOS 58 08/11/2016 1404   BILITOT 0.3 07/19/2022 0847   BILIDIR 0.0 06/24/2012 1055      Component Value Date/Time   TSH 2.170 07/19/2022 0847   Nutritional Lab Results  Component Value Date   VD25OH 51.3 07/19/2022   VD25OH 37.3 01/05/2022   VD25OH 45.01 02/24/2021     ASSESSMENT AND PLAN  TREATMENT PLAN FOR OBESITY:  Recommended Dietary Goals  Enas is currently in the action stage of change. As such, her goal is to continue weight management plan. She has agreed to the Category 3 Plan.  Behavioral Intervention  We discussed the following Behavioral Modification Strategies today: increasing lean protein intake, decreasing simple carbohydrates , increasing vegetables, increasing lower glycemic fruits, increasing fiber rich foods, avoiding skipping meals, increasing water intake, continue to practice mindfulness when eating, and planning for success.  Additional resources provided today: NA  Recommended Physical Activity Goals  Brittnye has been advised to work up to 150 minutes of moderate intensity aerobic activity a week and strengthening exercises 2-3 times per week for cardiovascular health, weight loss maintenance and preservation of muscle mass.   She has agreed to Continue current level of physical activity    Pharmacotherapy We discussed various medication options to help [facility] with her weight loss efforts and we both agreed to consider her options.  We discussed Qsymia, Contrave, Wellbutrin, Topamax today.  Side effects and patient concerns addressed and discussed today.   She is concerned about any medications that could possibly effect mood.  All of these mediations can possibly effect mood.  Handouts given on Qsymia and Contrave.  Patient to contact me to let me know how she would like to proceed.  Could also consider Metformin.    ASSOCIATED CONDITIONS ADDRESSED TODAY  Action/Plan  Impaired fasting glucose Emmajean will continue to work on weight loss, exercise, and decreasing simple carbohydrates to help decrease the risk of diabetes. Isabele agreed to follow-up with [name] as directed to closely monitor her progress.   Generalized obesity  BMI 28.0-28.9,adult  Return in about 4 weeks (around 09/28/2022).Marland Kitchen She was informed of the importance of frequent follow up visits to maximize her success with intensive lifestyle modifications for her multiple health conditions.   ATTESTASTION STATEMENTS:  Reviewed by clinician on day of visit: allergies, medications, problem list, medical history, surgical history, family history, social history, and previous encounter notes.   Time spent on visit including pre-visit chart review and post-visit care and charting was 30 minutes.    Theodis Sato. Jeremaih Klima FNP-C

## 2022-09-04 ENCOUNTER — Ambulatory Visit: Payer: BC Managed Care – PPO | Admitting: Family Medicine

## 2022-09-07 ENCOUNTER — Ambulatory Visit: Payer: BC Managed Care – PPO | Admitting: Family Medicine

## 2022-09-07 ENCOUNTER — Encounter: Payer: Self-pay | Admitting: Family Medicine

## 2022-09-07 VITALS — BP 106/65 | HR 87 | Temp 98.3°F | Resp 18 | Ht 66.0 in | Wt 178.7 lb

## 2022-09-07 DIAGNOSIS — J309 Allergic rhinitis, unspecified: Secondary | ICD-10-CM | POA: Diagnosis not present

## 2022-09-07 DIAGNOSIS — Z7689 Persons encountering health services in other specified circumstances: Secondary | ICD-10-CM | POA: Diagnosis not present

## 2022-09-07 MED ORDER — MONTELUKAST SODIUM 10 MG PO TABS: 10.0000 mg | ORAL_TABLET | Freq: Every day | ORAL | 1 refills | Status: DC

## 2022-09-07 MED ORDER — LEVOCETIRIZINE DIHYDROCHLORIDE 5 MG PO TABS: 5.0000 mg | ORAL_TABLET | Freq: Every evening | ORAL | 1 refills | Status: DC

## 2022-09-07 NOTE — Progress Notes (Signed)
New Patient Office Visit  Subjective    Patient ID: Tamara Ball, female    DOB: 11-Aug-1980  Age: 42 y.o. MRN: [id number]  CC:  Chief Complaint  Patient presents with   Establish Care    Patient is here to establish care with new pcp , She sttes that she needs refills on her allergy medication    HPI [facility] presents to establish care  Pt was at [facility]. Her previous provider left.  She takes 2 medicines for allergic rhinitis. She needs rx for Singulair 10mg  and Xyzal 5mg  at night. Pt reports she is fairly healthy. She sees neurologist for migraines. She says they are worse with her cycle.  Pt reports she hasn't had abnormal pap since 2017. 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  Pt is up to date with colonoscopy, pap and mammogram.  She has hx of fibroids and gets iron infusions due to heavy bleeding.   Outpatient Encounter Medications as of 09/07/2022  Medication Sig   AVIANE 0.1-20 MG-MCG tablet    b complex vitamins tablet Take 1 tablet by mouth daily.   baclofen (LIORESAL) 10 MG tablet Take 1 tablet (10 mg total) by mouth 3 (three) times daily.   Biotin 40981 MCG TABS Take 1 tablet by mouth daily.   diphenhydrAMINE HCl (BENADRYL PO) Take by mouth as needed.   DYMISTA 137-50 MCG/ACT SUSP 1 actuation each nostril twice daily (Patient taking differently: as needed. 1 actuation each nostril twice daily)   Erenumab-aooe (AIMOVIG) 140 MG/ML SOAJ Inject 140 mg into the skin every 28 (twenty-eight) days.   fluticasone (FLONASE) 50 MCG/ACT nasal spray Place 2 sprays into both nostrils daily.   levocetirizine (XYZAL) 5 MG tablet Take 1 tablet (5 mg total) by mouth every evening.   LORazepam (ATIVAN) 0.5 MG tablet Take 1 tablet (0.5 mg total) by mouth 2 (two) times daily as needed for anxiety.   montelukast (SINGULAIR) 10 MG tablet Take 1 tablet (10 mg total) by mouth at bedtime.   Multiple Vitamin (MULTIVITAMIN WITH MINERALS) TABS tablet  Take 1 tablet by mouth daily.   naproxen sodium (ANAPROX DS) 550 MG tablet Take 1 tablet (550 mg total) by mouth 2 (two) times daily with a meal.   Omega-3 Fatty Acids (FISH OIL) 1000 MG CAPS Take 1 capsule by mouth daily.   ondansetron (ZOFRAN-ODT) 4 MG disintegrating tablet Take 1 tablet (4 mg total) by mouth every 8 (eight) hours as needed for nausea or vomiting.   polyethylene glycol (MIRALAX / GLYCOLAX) 17 g packet Take 17 g by mouth daily as needed.   VITAMIN D PO Take 10,000 Units by mouth once a week.   No facility-administered encounter medications on file as of 09/07/2022.    Past Medical History:  Diagnosis Date   Anemia    Anxiety    Chicken pox    Chronic headaches    Constipation    Fibroids    Iron deficiency anemia due to chronic blood loss    from heavy periods   Menorrhagia    Palpitations     No past surgical history on file.  Family History  Problem Relation Age of Onset   Hypertension Mother    Cancer Mother        SCC (x2) dx. 94 and again 6 months later   Other Sister        tumors- unknown type   Cancer Maternal Aunt  colorectal cancer   Hypertension Maternal Uncle    Healthy Maternal Grandmother    Diabetes Maternal Grandfather    Glaucoma Maternal Grandfather    Hypertension Maternal Grandfather    Hyperlipidemia Maternal Grandfather    Gout Maternal Grandfather    Colon polyps Neg Hx    Colon cancer Neg Hx    Esophageal cancer Neg Hx     Social History   Socioeconomic History   Marital status: Single    Spouse name: Not on file   Number of children: 1   Years of education: Not on file   Highest education level: Master's degree (e.g., MA, MS, MEng, MEd, MSW, MBA)  Occupational History   Occupation: Public house manager  Tobacco Use   Smoking status: Never   Smokeless tobacco: Never  Vaping Use   Vaping Use: Never used  Substance and Sexual Activity   Alcohol use: Yes    Alcohol/week: 0.0 standard drinks of alcohol    Comment: social    Drug use: No   Sexual activity: Not on file  Other Topics Concern   Not on file  Social History Narrative   Work or School: higher education - Ambulance person      Home Situation: lives with 44 yo      Spiritual Beliefs: Christian      Lifestyle: no regular exercise; diet is so so      Social Determinants of Corporate investment banker Strain: Low Risk  (09/16/2021)   Overall Financial Resource Strain (CARDIA)    Difficulty of Paying Living Expenses: Not hard at all  Food Insecurity: No Food Insecurity (09/16/2021)   Hunger Vital Sign    Worried About Running Out of Food in the Last Year: Never true    Ran Out of Food in the Last Year: Never true  Transportation Needs: No Transportation Needs (09/16/2021)   PRAPARE - Administrator, Civil Service (Medical): No    Lack of Transportation (Non-Medical): No  Physical Activity: Sufficiently Active (09/16/2021)   Exercise Vital Sign    Days of Exercise per Week: 3 days    Minutes of Exercise per Session: 50 min  Stress: Stress Concern Present (09/16/2021)   [employer] of Occupational Health - Occupational Stress Questionnaire    Feeling of Stress : To some extent  Social Connections: Unknown (09/16/2021)   Social Connection and Isolation Panel [NHANES]    Frequency of Communication with Friends and Family: More than three times a week    Frequency of Social Gatherings with Friends and Family: Twice a week    Attends Religious Services: Patient declined    Database administrator or Organizations: No    Attends Engineer, structural: Not on file    Marital Status: Never married  Intimate Partner Violence: Not on file    Review of Systems  All other systems reviewed and are negative.       Objective    BP 106/65   Pulse 87   Temp 98.3 F (36.8 C) (Oral)   Resp 18   Ht 5\' 6"  (1.676 m)   Wt 178 lb 11.2 oz (81.1 kg)   SpO2 100%   BMI 28.84 kg/m   Physical Exam Vitals and nursing note  reviewed.  Constitutional:      Appearance: Normal appearance. She is normal weight.  HENT:     Head: Normocephalic and atraumatic.     Right Ear: External ear normal.  Left Ear: External ear normal.     Nose: Nose normal.     Mouth/Throat:     Mouth: Mucous membranes are moist.  Eyes:     Pupils: Pupils are equal, round, and reactive to light.  Cardiovascular:     Rate and Rhythm: Normal rate and regular rhythm.     Pulses: Normal pulses.     Heart sounds: Normal heart sounds.  Pulmonary:     Effort: Pulmonary effort is normal.     Breath sounds: Normal breath sounds.  Skin:    General: Skin is warm.     Capillary Refill: Capillary refill takes less than 2 seconds.  Neurological:     General: No focal deficit present.     Mental Status: She is alert. Mental status is at baseline.  Psychiatric:        Mood and Affect: Mood normal.        Behavior: Behavior normal.        Thought Content: Thought content normal.        Judgment: Judgment normal.        Assessment & Plan:   Problem List Items Addressed This Visit   None Encounter to establish care with new doctor  Allergic rhinitis, unspecified seasonality, unspecified trigger -     Levocetirizine Dihydrochloride; Take 1 tablet (5 mg total) by mouth every evening.  Dispense: 90 tablet; Refill: 1 -     Montelukast Sodium; Take 1 tablet (10 mg total) by mouth at bedtime.  Dispense: 90 tablet; Refill: 1     Refilled allergy medicines  No follow-ups on file.   Suzan Slick, MD

## 2022-09-13 ENCOUNTER — Ambulatory Visit: Payer: BC Managed Care – PPO | Admitting: Nurse Practitioner

## 2022-09-13 ENCOUNTER — Encounter: Payer: Self-pay | Admitting: Nurse Practitioner

## 2022-09-13 VITALS — BP 100/68 | HR 60 | Temp 98.0°F | Ht 66.0 in | Wt 179.0 lb

## 2022-09-13 DIAGNOSIS — Z6828 Body mass index (BMI) 28.0-28.9, adult: Secondary | ICD-10-CM

## 2022-09-13 DIAGNOSIS — R7301 Impaired fasting glucose: Secondary | ICD-10-CM

## 2022-09-13 DIAGNOSIS — E669 Obesity, unspecified: Secondary | ICD-10-CM

## 2022-09-13 DIAGNOSIS — R638 Other symptoms and signs concerning food and fluid intake: Secondary | ICD-10-CM

## 2022-09-13 DIAGNOSIS — R0602 Shortness of breath: Secondary | ICD-10-CM | POA: Diagnosis not present

## 2022-09-13 MED ORDER — RYBELSUS 7 MG PO TABS: 7.0000 mg | ORAL_TABLET | Freq: Every day | ORAL | 0 refills | Status: DC

## 2022-09-13 MED ORDER — BUPROPION HCL ER (SR) 150 MG PO TB12
150.0000 mg | ORAL_TABLET | Freq: Every day | ORAL | 0 refills | Status: DC
Start: 2022-09-13 — End: 2022-12-26

## 2022-09-13 NOTE — Progress Notes (Unsigned)
we  Office: [phone]  /  Fax: [phone]  WEIGHT SUMMARY AND BIOMETRICS  No data recorded Weight Gained Since Last Visit: 5lb   Vitals Temp: 98 F (36.7 C) BP: 100/68 Pulse Rate: 60 SpO2: 100 %   Anthropometric Measurements Height: 5\' 6"  (1.676 m) Weight: 179 lb (81.2 kg) BMI (Calculated): 28.91 Weight at Last Visit: 174lb Weight Gained Since Last Visit: 5lb Starting Weight: 205lb Total Weight Loss (lbs): 26 lb (11.8 kg)   Body Composition  Body Fat %: 33.9 % Fat Mass (lbs): 60.8 lbs Muscle Mass (lbs): 112.4 lbs Total Body Water (lbs): 82.4 lbs Visceral Fat Rating : 7   Other Clinical Data RMR: 1699 Fasting: Yes Labs: No Today's Visit #: 44 Starting Date: 06/13/18     HPI  Chief Complaint: OBESITY  Tamara Ball is here to discuss her progress with her obesity treatment plan. She is on the the Category 3 Plan and states she is following her eating plan approximately 85 % of the time. She states she is exercising 45 minutes 2-5 days per week.   Interval History:  Since last office visit she has gained 5 lbs. She is struggling with hunger and cravings especially since stopping Ozempic.  She has inreased her protein intake and has been exercising more+. She is drinking water and zero drink.     Pharmacotherapy for weight loss: She {srtis (Optional):29129} currently taking {srtpreviousweightlossmeds (Optional):29124} for medical weight loss.  Denies side effects.    Previous pharmacotherapy for medical weight loss:  {srtpreviousweightlossmeds (Optional):29124}  She stopped *** due to side effects of ***.  Bariatric surgery:  Patient is status post {srtweightlosssurgery:29125} by Dr.  Marland Kitchen in ***.  Her highest weight prior to surgery was *** and her nadir weight after surgery was ***.  She is taking {srtvitamins (Optional):29126}.  She reports {srtrestriction (Optional):29127} restriction.    Last IC was 1977 on 01/07/19 Today IC was 1699   PHYSICAL  EXAM:  Blood pressure 100/68, pulse 60, temperature 98 F (36.7 C), height 5\' 6"  (1.676 m), weight 179 lb (81.2 kg), last menstrual period 09/04/2022, SpO2 100 %. Body mass index is 28.89 kg/m.  General: She is overweight, cooperative, alert, well developed, and in no acute distress. PSYCH: Has normal mood, affect and thought process.   Extremities: No edema.  Neurologic: No gross sensory or motor deficits. No tremors or fasciculations noted.    DIAGNOSTIC DATA REVIEWED:  BMET    Component Value Date/Time   NA 142 07/19/2022 0847   K 4.2 07/19/2022 0847   K 4.2 08/11/2016 1404   CL 108 (H) 07/19/2022 0847   CL 103 08/11/2016 1404   CO2 21 07/19/2022 0847   CO2 26 08/11/2016 1404   GLUCOSE 80 07/19/2022 0847   GLUCOSE 90 02/24/2021 0720   BUN 11 07/19/2022 0847   CREATININE 0.82 07/19/2022 0847   CREATININE 0.87 08/11/2016 1404   CALCIUM 9.1 07/19/2022 0847   CALCIUM 9.5 08/11/2016 1404   GFRNONAA >60 06/20/2020 0405   GFRAA 83 01/27/2020 0844   Lab Results  Component Value Date   HGBA1C 5.4 07/19/2022   HGBA1C 5.0 04/07/2015   Lab Results  Component Value Date   INSULIN 10.2 07/19/2022   INSULIN 8.9 06/13/2018   Lab Results  Component Value Date   TSH 2.170 07/19/2022   CBC    Component Value Date/Time   WBC 6.7 07/17/2022 0959   WBC 9.4 06/20/2020 0405   RBC 4.26 07/17/2022 0959   RBC 4.33 07/17/2022  0959   HGB 10.0 (L) 07/17/2022 0959   HGB 11.4 01/05/2022 1021   HGB 14.2 04/20/2017 1524   HCT 33.8 (L) 07/17/2022 0959   HCT 36.3 01/05/2022 1021   HCT 43.2 04/20/2017 1524   PLT 258 07/17/2022 0959   PLT 258 01/05/2022 1021   MCV 78.1 (L) 07/17/2022 0959   MCV 80 01/05/2022 1021   MCV 90 04/20/2017 1524   MCH 23.1 (L) 07/17/2022 0959   MCHC 29.6 (L) 07/17/2022 0959   RDW 15.3 07/17/2022 0959   RDW 13.7 01/05/2022 1021   RDW 13.5 04/20/2017 1524   Iron Studies    Component Value Date/Time   IRON 19 (L) 07/17/2022 0959   IRON 37 01/05/2022  1021   IRON 103 04/20/2017 1524   TIBC 568 (H) 07/17/2022 0959   TIBC 450 (H) 04/20/2017 1524   FERRITIN 3 (L) 07/17/2022 0959   FERRITIN 9 (L) 01/05/2022 1021   FERRITIN 23 04/20/2017 1524   IRONPCTSAT 3 (L) 07/17/2022 0959   IRONPCTSAT 23 04/20/2017 1524   IRONPCTSAT 18 (L) 10/30/2014 1213   Lipid Panel     Component Value Date/Time   CHOL 126 01/05/2022 1021   TRIG 31 01/05/2022 1021   HDL 62 01/05/2022 1021   CHOLHDL 2.2 07/13/2020 1421   CHOLHDL 2 05/27/2018 0848   VLDL 10.4 05/27/2018 0848   LDLCALC 55 01/05/2022 1021   Hepatic Function Panel     Component Value Date/Time   PROT 6.5 07/19/2022 0847   ALBUMIN 4.1 07/19/2022 0847   ALBUMIN 4.2 08/11/2016 1404   AST 16 07/19/2022 0847   AST 9 08/11/2016 1404   ALT 15 07/19/2022 0847   ALT 8 08/11/2016 1404   ALKPHOS 69 07/19/2022 0847   ALKPHOS 58 08/11/2016 1404   BILITOT 0.3 07/19/2022 0847   BILIDIR 0.0 06/24/2012 1055      Component Value Date/Time   TSH 2.170 07/19/2022 0847   Nutritional Lab Results  Component Value Date   VD25OH 51.3 07/19/2022   VD25OH 37.3 01/05/2022   VD25OH 45.01 02/24/2021     ASSESSMENT AND PLAN  TREATMENT PLAN FOR OBESITY:  Recommended Dietary Goals  Tovia is currently in the action stage of change. As such, her goal is to continue weight management plan. She has agreed to {MWMwtlossportion/plan2:23431}.  Behavioral Intervention  We discussed the following Behavioral Modification Strategies today: {EMWMwtlossstrategies:28914::"increasing lean protein intake","decreasing simple carbohydrates ","increasing vegetables","increasing lower glycemic fruits","increasing water intake","continue to practice mindfulness when eating","planning for success"}.  Additional resources provided today: NA  Recommended Physical Activity Goals  Gearldine has been advised to work up to 150 minutes of moderate intensity aerobic activity a week and strengthening exercises 2-3 times per week  for cardiovascular health, weight loss maintenance and preservation of muscle mass.   She has agreed to {EMEXERCISE:28847}   Pharmacotherapy We discussed various medication options to help [facility] with her weight loss efforts and we both agreed to ***.  ASSOCIATED CONDITIONS ADDRESSED TODAY  Action/Plan  SOB (shortness of breath)  Impaired fasting glucose -     Rybelsus; Take 1 tablet (7 mg total) by mouth daily.  Dispense: 30 tablet; Refill: 0  Generalized obesity  BMI 28.0-28.9,adult  Abnormal craving -     buPROPion HCl ER (SR); Take 1 tablet (150 mg total) by mouth daily.  Dispense: 30 tablet; Refill: 0         No follow-ups on file.Marland Kitchen She was informed of the importance of frequent follow up visits to maximize  her success with intensive lifestyle modifications for her multiple health conditions.   ATTESTASTION STATEMENTS:  Reviewed by clinician on day of visit: allergies, medications, problem list, medical history, surgical history, family history, social history, and previous encounter notes.   Time spent on visit including pre-visit chart review and post-visit care and charting was *** minutes.    Theodis Sato. Mariam Helbert FNP-C

## 2022-09-13 NOTE — Patient Instructions (Signed)

## 2022-09-14 ENCOUNTER — Encounter: Payer: Self-pay | Admitting: Nurse Practitioner

## 2022-10-16 ENCOUNTER — Encounter: Payer: Self-pay | Admitting: Nurse Practitioner

## 2022-10-19 ENCOUNTER — Ambulatory Visit: Payer: BC Managed Care – PPO | Admitting: Nurse Practitioner

## 2022-10-27 ENCOUNTER — Telehealth: Payer: BC Managed Care – PPO | Admitting: Physician Assistant

## 2022-10-27 DIAGNOSIS — B3731 Acute candidiasis of vulva and vagina: Secondary | ICD-10-CM

## 2022-10-27 MED ORDER — FLUCONAZOLE 150 MG PO TABS
150.0000 mg | ORAL_TABLET | ORAL | 0 refills | Status: DC | PRN
Start: 2022-10-27 — End: 2022-12-26

## 2022-10-27 NOTE — Progress Notes (Signed)

## 2022-12-26 ENCOUNTER — Ambulatory Visit: Payer: BC Managed Care – PPO | Admitting: Nurse Practitioner

## 2022-12-26 ENCOUNTER — Encounter: Payer: Self-pay | Admitting: Nurse Practitioner

## 2022-12-26 VITALS — BP 111/71 | HR 72 | Temp 98.2°F | Ht 66.0 in | Wt 189.0 lb

## 2022-12-26 DIAGNOSIS — R632 Polyphagia: Secondary | ICD-10-CM

## 2022-12-26 DIAGNOSIS — Z6830 Body mass index (BMI) 30.0-30.9, adult: Secondary | ICD-10-CM

## 2022-12-26 DIAGNOSIS — E669 Obesity, unspecified: Secondary | ICD-10-CM

## 2022-12-26 MED ORDER — LIRAGLUTIDE 18 MG/3ML ~~LOC~~ SOPN
0.6000 mg | PEN_INJECTOR | Freq: Every day | SUBCUTANEOUS | 0 refills | Status: DC
Start: 2022-12-26 — End: 2023-01-25

## 2022-12-26 NOTE — Patient Instructions (Signed)

## 2022-12-26 NOTE — Progress Notes (Signed)
Office: [phone]  /  Fax: [phone]  WEIGHT SUMMARY AND BIOMETRICS  Weight Lost Since Last Visit: 0lb  Weight Gained Since Last Visit: 10lb   Vitals Temp: 98.2 F (36.8 C) BP: 111/71 Pulse Rate: 72 SpO2: 100 %   Anthropometric Measurements Height: 5\' 6"  (1.676 m) Weight: 189 lb (85.7 kg) BMI (Calculated): 30.52 Weight at Last Visit: 179lb Weight Lost Since Last Visit: 0lb Weight Gained Since Last Visit: 10lb Starting Weight: 205lb Total Weight Loss (lbs): 16 lb (7.258 kg)   Body Composition  Body Fat %: 34.1 % Fat Mass (lbs): 64.4 lbs Muscle Mass (lbs): 118.4 lbs Total Body Water (lbs): 84.6 lbs Visceral Fat Rating : 7   Other Clinical Data Fasting: No Labs: No Today's Visit #: 45 Starting Date: 06/13/18     HPI  Chief Complaint: OBESITY  Oris is here to discuss her progress with her obesity treatment plan. She is on the the Category 3 Plan and states she is following her eating plan approximately 50 % of the time. She states she is exercising 90 minutes 4 days per week.   Interval History:  Since last office visit she has gained 10 pounds.  She is not skipping meals.  She is eating a protein with each meal.  She is drinking water. Denies sugary drinks.  Reports polyphagia.     Pharmacotherapy for weight loss: She is not currently taking medications  for medical weight loss. I prescribed Wellbutrin after her last visit and she decided not to take it.  She also tried Rybelsus but stopped because she didn't feel it was beneficial  Previous pharmacotherapy for medical weight loss:  Ozempic, Saxenda, Wellbutrin, Topamax and most recently Rybelsus.    Bariatric surgery:  She has not had bariatric surgery  PHYSICAL EXAM:  Blood pressure 111/71, pulse 72, temperature 98.2 F (36.8 C), height 5\' 6"  (1.676 m), weight 189 lb (85.7 kg), last menstrual period 12/06/2022, SpO2 100%. Body mass index is 30.51 kg/m.  General: She is overweight,  cooperative, alert, well developed, and in no acute distress. PSYCH: Has normal mood, affect and thought process.   Extremities: No edema.  Neurologic: No gross sensory or motor deficits. No tremors or fasciculations noted.    DIAGNOSTIC DATA REVIEWED:  BMET    Component Value Date/Time   NA 142 07/19/2022 0847   K 4.2 07/19/2022 0847   K 4.2 08/11/2016 1404   CL 108 (H) 07/19/2022 0847   CL 103 08/11/2016 1404   CO2 21 07/19/2022 0847   CO2 26 08/11/2016 1404   GLUCOSE 80 07/19/2022 0847   GLUCOSE 90 02/24/2021 0720   BUN 11 07/19/2022 0847   CREATININE 0.82 07/19/2022 0847   CREATININE 0.87 08/11/2016 1404   CALCIUM 9.1 07/19/2022 0847   CALCIUM 9.5 08/11/2016 1404   GFRNONAA >60 06/20/2020 0405   GFRAA 83 01/27/2020 0844   Lab Results  Component Value Date   HGBA1C 5.4 07/19/2022   HGBA1C 5.0 04/07/2015   Lab Results  Component Value Date   INSULIN 10.2 07/19/2022   INSULIN 8.9 06/13/2018   Lab Results  Component Value Date   TSH 2.170 07/19/2022   CBC    Component Value Date/Time   WBC 6.7 07/17/2022 0959   WBC 9.4 06/20/2020 0405   RBC 4.26 07/17/2022 0959   RBC 4.33 07/17/2022 0959   HGB 10.0 (L) 07/17/2022 0959   HGB 11.4 01/05/2022 1021   HGB 14.2 04/20/2017 1524   HCT 33.8 (L) 07/17/2022  0959   HCT 36.3 01/05/2022 1021   HCT 43.2 04/20/2017 1524   PLT 258 07/17/2022 0959   PLT 258 01/05/2022 1021   MCV 78.1 (L) 07/17/2022 0959   MCV 80 01/05/2022 1021   MCV 90 04/20/2017 1524   MCH 23.1 (L) 07/17/2022 0959   MCHC 29.6 (L) 07/17/2022 0959   RDW 15.3 07/17/2022 0959   RDW 13.7 01/05/2022 1021   RDW 13.5 04/20/2017 1524   Iron Studies    Component Value Date/Time   IRON 19 (L) 07/17/2022 0959   IRON 37 01/05/2022 1021   IRON 103 04/20/2017 1524   TIBC 568 (H) 07/17/2022 0959   TIBC 450 (H) 04/20/2017 1524   FERRITIN 3 (L) 07/17/2022 0959   FERRITIN 9 (L) 01/05/2022 1021   FERRITIN 23 04/20/2017 1524   IRONPCTSAT 3 (L) 07/17/2022 0959    IRONPCTSAT 23 04/20/2017 1524   IRONPCTSAT 18 (L) 10/30/2014 1213   Lipid Panel     Component Value Date/Time   CHOL 126 01/05/2022 1021   TRIG 31 01/05/2022 1021   HDL 62 01/05/2022 1021   CHOLHDL 2.2 07/13/2020 1421   CHOLHDL 2 05/27/2018 0848   VLDL 10.4 05/27/2018 0848   LDLCALC 55 01/05/2022 1021   Hepatic Function Panel     Component Value Date/Time   PROT 6.5 07/19/2022 0847   ALBUMIN 4.1 07/19/2022 0847   ALBUMIN 4.2 08/11/2016 1404   AST 16 07/19/2022 0847   AST 9 08/11/2016 1404   ALT 15 07/19/2022 0847   ALT 8 08/11/2016 1404   ALKPHOS 69 07/19/2022 0847   ALKPHOS 58 08/11/2016 1404   BILITOT 0.3 07/19/2022 0847   BILIDIR 0.0 06/24/2012 1055      Component Value Date/Time   TSH 2.170 07/19/2022 0847   Nutritional Lab Results  Component Value Date   VD25OH 51.3 07/19/2022   VD25OH 37.3 01/05/2022   VD25OH 45.01 02/24/2021     ASSESSMENT AND PLAN  TREATMENT PLAN FOR OBESITY:  Recommended Dietary Goals  Donasia is currently in the action stage of change. As such, her goal is to continue weight management plan. She has agreed to the Category 3 Plan.  Behavioral Intervention  We discussed the following Behavioral Modification Strategies today: increasing lean protein intake, decreasing simple carbohydrates , increasing vegetables, increasing lower glycemic fruits, increasing water intake, reading food labels , keeping healthy foods at home, continue to practice mindfulness when eating, and planning for success.  Additional resources provided today: NA  Recommended Physical Activity Goals  Tayllor has been advised to work up to 150 minutes of moderate intensity aerobic activity a week and strengthening exercises 2-3 times per week for cardiovascular health, weight loss maintenance and preservation of muscle mass.   She has agreed to Continue current level of physical activity    Pharmacotherapy We discussed various medication options to help  [facility] with her weight loss efforts and we both agreed to start Victoza 0.6mg  off label for weight loss.  ASSOCIATED CONDITIONS ADDRESSED TODAY  Action/Plan  Polyphagia -     Liraglutide; Inject 0.6 mg into the skin daily.  Dispense: 3 mL; Refill: 0  Generalized obesity -     Liraglutide; Inject 0.6 mg into the skin daily.  Dispense: 3 mL; Refill: 0  BMI 30.0-30.9,adult       Return in about 4 weeks (around 01/23/2023).Marland Kitchen She was informed of the importance of frequent follow up visits to maximize her success with intensive lifestyle modifications for her multiple health  conditions.   ATTESTASTION STATEMENTS:  Reviewed by clinician on day of visit: allergies, medications, problem list, medical history, surgical history, family history, social history, and previous encounter notes.     Theodis Sato. Daphene Chisholm FNP-C

## 2023-01-02 ENCOUNTER — Ambulatory Visit: Payer: BC Managed Care – PPO | Admitting: Family Medicine

## 2023-01-02 VITALS — BP 132/72 | HR 84 | Temp 98.4°F | Ht 66.0 in | Wt 190.0 lb

## 2023-01-02 DIAGNOSIS — R002 Palpitations: Secondary | ICD-10-CM | POA: Diagnosis not present

## 2023-01-02 DIAGNOSIS — R053 Chronic cough: Secondary | ICD-10-CM | POA: Diagnosis not present

## 2023-01-02 NOTE — Progress Notes (Signed)
Acute Office Visit  Subjective:     Patient ID: Tamara Ball, female    DOB: 10-13-1980, 42 y.o.   MRN: [id number]  Chief Complaint  Patient presents with   Cough    Dry cough onset for 2/3 months     Cough Patient is in today for follow up.  Cough Pt reports she has episodes of cough. This has been present for the last few months. She has to drink some water in order for the cough to stop. She does report she did have a trip in June and July, where she did have a URI. She reports this has worsened since that time. She does have hx of allergic rhinitis and taking Xyzal 5mg  and Singulair 10mg  daily.  She denies GERD symptoms. She denies chest tightness with the cough. She does report occasional wheezing during these spells. She denies coughing up phlegm. She reports the spells happen when she is at work or laying down at night. She reports she use to see an allergist.   Palpitations She reports she has a lot of heart palpitations. She's worn a zio patch x 2. She reports she had Covid and was told this could be from long covid. She is unsure of family hx of cardiac disease. She reports she has them mostly at night, sometimes during the day. She does report she's had a sleep study done many years ago but was negative. This was reviewed today.   Review of Systems  Respiratory:  Positive for cough.   Cardiovascular:  Positive for palpitations.  All other systems reviewed and are negative.      Objective:    BP 132/72   Pulse 84   Temp 98.4 F (36.9 C) (Oral)   Ht 5\' 6"  (1.676 m)   Wt 190 lb (86.2 kg)   LMP 12/06/2022 (Approximate)   SpO2 99%   BMI 30.67 kg/m    Physical Exam Vitals and nursing note reviewed.  Constitutional:      Appearance: Normal appearance. She is normal weight.  HENT:     Head: Normocephalic and atraumatic.     Right Ear: External ear normal.     Left Ear: External ear normal.     Nose: Nose normal.     Mouth/Throat:     Mouth: Mucous membranes  are moist.     Pharynx: Oropharynx is clear.  Eyes:     Conjunctiva/sclera: Conjunctivae normal.     Pupils: Pupils are equal, round, and reactive to light.  Cardiovascular:     Rate and Rhythm: Normal rate and regular rhythm.     Pulses: Normal pulses.     Heart sounds: Normal heart sounds.  Pulmonary:     Effort: Pulmonary effort is normal.     Breath sounds: Normal breath sounds.  Skin:    General: Skin is warm.     Capillary Refill: Capillary refill takes less than 2 seconds.  Neurological:     General: No focal deficit present.     Mental Status: She is alert and oriented to person, place, and time. Mental status is at baseline.  Psychiatric:        Mood and Affect: Mood normal.        Behavior: Behavior normal.        Thought Content: Thought content normal.        Judgment: Judgment normal.   No results found for any visits on 01/02/23.      Assessment & Plan:  Problem List Items Addressed This Visit   None Chronic cough -     Ambulatory referral to Allergy  Palpitations -     Ambulatory referral to Cardiology   Advised pt to start taking Flonase daily as she hasn't been. Continue taking Singulair 10mg  and Xyxal 5mg  daily. Also will refer to allergist for further evaluation. Due to palpitations, refer to cardiology.  No orders of the defined types were placed in this encounter.  Total time spent with patient today 35 minutes. This includes reviewing records, evaluating the patient and coordinating care. Face-to-face time >50%.  No follow-ups on file.  Suzan Slick, MD

## 2023-01-10 ENCOUNTER — Encounter: Payer: Self-pay | Admitting: Nurse Practitioner

## 2023-01-17 ENCOUNTER — Inpatient Hospital Stay: Payer: BC Managed Care – PPO | Admitting: Hematology & Oncology

## 2023-01-17 ENCOUNTER — Encounter: Payer: Self-pay | Admitting: Hematology & Oncology

## 2023-01-17 ENCOUNTER — Inpatient Hospital Stay: Payer: BC Managed Care – PPO | Attending: Family

## 2023-01-17 VITALS — BP 127/74 | HR 76 | Temp 98.0°F | Resp 20 | Ht 66.0 in | Wt 189.4 lb

## 2023-01-17 DIAGNOSIS — D500 Iron deficiency anemia secondary to blood loss (chronic): Secondary | ICD-10-CM | POA: Diagnosis not present

## 2023-01-17 DIAGNOSIS — N920 Excessive and frequent menstruation with regular cycle: Secondary | ICD-10-CM | POA: Insufficient documentation

## 2023-01-17 LAB — CBC WITH DIFFERENTIAL (CANCER CENTER ONLY)
Abs Immature Granulocytes: 0.02 10*3/uL (ref 0.00–0.07)
Basophils Absolute: 0.1 10*3/uL (ref 0.0–0.1)
Basophils Relative: 1 %
Eosinophils Absolute: 0.4 10*3/uL (ref 0.0–0.5)
Eosinophils Relative: 5 %
HCT: 34.8 % — ABNORMAL LOW (ref 36.0–46.0)
Hemoglobin: 9.9 g/dL — ABNORMAL LOW (ref 12.0–15.0)
Immature Granulocytes: 0 %
Lymphocytes Relative: 24 %
Lymphs Abs: 1.7 10*3/uL (ref 0.7–4.0)
MCH: 20.9 pg — ABNORMAL LOW (ref 26.0–34.0)
MCHC: 28.4 g/dL — ABNORMAL LOW (ref 30.0–36.0)
MCV: 73.6 fL — ABNORMAL LOW (ref 80.0–100.0)
Monocytes Absolute: 0.6 10*3/uL (ref 0.1–1.0)
Monocytes Relative: 8 %
Neutro Abs: 4.5 10*3/uL (ref 1.7–7.7)
Neutrophils Relative %: 62 %
Platelet Count: 246 10*3/uL (ref 150–400)
RBC: 4.73 MIL/uL (ref 3.87–5.11)
RDW: 16.9 % — ABNORMAL HIGH (ref 11.5–15.5)
WBC Count: 7.2 10*3/uL (ref 4.0–10.5)
nRBC: 0 % (ref 0.0–0.2)

## 2023-01-17 LAB — IRON AND IRON BINDING CAPACITY (CC-WL,HP ONLY)
Iron: 27 ug/dL — ABNORMAL LOW (ref 28–170)
Saturation Ratios: 5 % — ABNORMAL LOW (ref 10.4–31.8)
TIBC: 588 ug/dL — ABNORMAL HIGH (ref 250–450)
UIBC: 561 ug/dL — ABNORMAL HIGH (ref 148–442)

## 2023-01-17 LAB — RETICULOCYTES
Immature Retic Fract: 18.4 % — ABNORMAL HIGH (ref 2.3–15.9)
RBC.: 4.73 MIL/uL (ref 3.87–5.11)
Retic Count, Absolute: 63.4 10*3/uL (ref 19.0–186.0)
Retic Ct Pct: 1.3 % (ref 0.4–3.1)

## 2023-01-17 LAB — FERRITIN: Ferritin: 4 ng/mL — ABNORMAL LOW (ref 11–307)

## 2023-01-17 NOTE — Progress Notes (Signed)
Hematology and Oncology Follow Up Visit  Tamara Ball [id number] Jul 29, 1980 42 y.o. 01/17/2023   Principle Diagnosis:  Iron deficiency anemia secondary to menorrhagia    Current Therapy:        IV iron as indicated  --Ferrlecit given on 08/01/2022   Interim History:  Ms. Wellman is here today for follow-up.  We last saw her back in March.  Since then, she is doing okay although is she is having heavy cycles.  She is supposed to have a procedure to help with the fibroids.  She is not sure when this will happen.  Her insurance has to approve this.  She got some of Ferrlecit back in March.  At that time, her ferritin was over 3 with an iron saturation of 3%.  I am sure that her iron still can be on the low side as her MCV is on the low side.  I think that the only way going to improve the iron deficiency is for the fibroids to be taken care of.  She also is involved with a weight loss protocol.  I know your family doctor is trying to help her get medication to try to help with losing weight.  She is still working.  She is quite busy at work.  She does not chew ice.  She has had no cough or shortness of breath.  She has had no problems with COVID recently.  She has had no fever.  There has been no rashes.  She has had no leg swelling.  Overall, I would have to say that her performance status is probably ECOG 1.   Medications:  Allergies as of 01/17/2023   No Known Allergies      Medication List        Accurate as of January 17, 2023 11:47 AM. If you have any questions, ask your nurse or doctor.          STOP taking these medications    Aimovig 140 MG/ML Soaj Generic drug: Erenumab-aooe Stopped by: Josph Macho   Aviane 0.1-20 MG-MCG tablet Generic drug: levonorgestrel-ethinyl estradiol Stopped by: Josph Macho   baclofen 10 MG tablet Commonly known as: LIORESAL Stopped by: Josph Macho   LORazepam 0.5 MG tablet Commonly known as: ATIVAN Stopped by:  Josph Macho   naproxen sodium 550 MG tablet Commonly known as: Anaprox DS Stopped by: Rose Phi Sayf Kerner   ondansetron 4 MG disintegrating tablet Commonly known as: ZOFRAN-ODT Stopped by: Josph Macho       TAKE these medications    b complex vitamins tablet Take 1 tablet by mouth daily.   BENADRYL PO Take by mouth as needed.   Biotin 02725 MCG Tabs Take 1 tablet by mouth daily.   Fish Oil 1000 MG Caps Take 1 capsule by mouth daily.   fluticasone 50 MCG/ACT nasal spray Commonly known as: FLONASE Place 2 sprays into both nostrils daily.   levocetirizine 5 MG tablet Commonly known as: XYZAL Take 1 tablet (5 mg total) by mouth every evening.   liraglutide 18 MG/3ML Sopn Commonly known as: VICTOZA Inject 0.6 mg into the skin daily.   montelukast 10 MG tablet Commonly known as: SINGULAIR Take 1 tablet (10 mg total) by mouth at bedtime.   multivitamin with minerals Tabs tablet Take 1 tablet by mouth daily.   polyethylene glycol 17 g packet Commonly known as: MIRALAX / GLYCOLAX Take 17 g by mouth daily as needed.   VITAMIN D  PO Take 10,000 Units by mouth once a week.        Allergies: No Known Allergies  Past Medical History, Surgical history, Social history, and Family History were reviewed and updated.  Review of Systems: Review of Systems  Constitutional:  Positive for malaise/fatigue.  HENT: Negative.    Eyes: Negative.   Respiratory: Negative.    Cardiovascular: Negative.   Gastrointestinal: Negative.   Genitourinary: Negative.   Musculoskeletal: Negative.   Skin: Negative.   Neurological: Negative.   Endo/Heme/Allergies:  Bruises/bleeds easily.  Psychiatric/Behavioral: Negative.       Physical Exam:  height is 5\' 6"  (1.676 m) and weight is 189 lb 6.4 oz (85.9 kg). Her oral temperature is 98 F (36.7 C). Her blood pressure is 127/74 and her pulse is 76. Her respiration is 20.   Wt Readings from Last 3 Encounters:  01/17/23 189 lb  6.4 oz (85.9 kg)  01/02/23 190 lb (86.2 kg)  12/26/22 189 lb (85.7 kg)   Physical Exam Vitals reviewed.  HENT:     Head: Normocephalic and atraumatic.  Eyes:     Pupils: Pupils are equal, round, and reactive to light.  Cardiovascular:     Rate and Rhythm: Normal rate and regular rhythm.     Heart sounds: Normal heart sounds.  Pulmonary:     Effort: Pulmonary effort is normal.     Breath sounds: Normal breath sounds.  Abdominal:     General: Bowel sounds are normal.     Palpations: Abdomen is soft.  Musculoskeletal:        General: No tenderness or deformity. Normal range of motion.     Cervical back: Normal range of motion.  Lymphadenopathy:     Cervical: No cervical adenopathy.  Skin:    General: Skin is warm and dry.     Findings: No erythema or rash.  Neurological:     Mental Status: She is alert and oriented to person, place, and time.  Psychiatric:        Behavior: Behavior normal.        Thought Content: Thought content normal.        Judgment: Judgment normal.       Lab Results  Component Value Date   WBC 7.2 01/17/2023   HGB 9.9 (L) 01/17/2023   HCT 34.8 (L) 01/17/2023   MCV 73.6 (L) 01/17/2023   PLT 246 01/17/2023   Lab Results  Component Value Date   FERRITIN 3 (L) 07/17/2022   IRON 19 (L) 07/17/2022   TIBC 568 (H) 07/17/2022   UIBC 549 (H) 07/17/2022   IRONPCTSAT 3 (L) 07/17/2022   Lab Results  Component Value Date   RETICCTPCT 1.3 01/17/2023   RBC 4.73 01/17/2023   RETICCTABS 64,400 05/16/2016   No results found for: "KPAFRELGTCHN", "LAMBDASER", "KAPLAMBRATIO" No results found for: "IGGSERUM", "IGA", "IGMSERUM" No results found for: "TOTALPROTELP", "ALBUMINELP", "A1GS", "A2GS", "BETS", "BETA2SER", "GAMS", "MSPIKE", "SPEI"   Chemistry      Component Value Date/Time   NA 142 07/19/2022 0847   K 4.2 07/19/2022 0847   K 4.2 08/11/2016 1404   CL 108 (H) 07/19/2022 0847   CL 103 08/11/2016 1404   CO2 21 07/19/2022 0847   CO2 26 08/11/2016  1404   BUN 11 07/19/2022 0847   CREATININE 0.82 07/19/2022 0847   CREATININE 0.87 08/11/2016 1404      Component Value Date/Time   CALCIUM 9.1 07/19/2022 0847   CALCIUM 9.5 08/11/2016 1404   ALKPHOS 69 07/19/2022  0847   ALKPHOS 58 08/11/2016 1404   AST 16 07/19/2022 0847   AST 9 08/11/2016 1404   ALT 15 07/19/2022 0847   ALT 8 08/11/2016 1404   BILITOT 0.3 07/19/2022 0847       Impression and Plan: Ms. Harkins is a very pleasant 42 yo African American female with iron deficiency secondary to heavy cycles.  Again, she has fibroids.  They are awaiting to improve the iron deficiency is to have the fibroids taken care of.  It sounds like this will happen.  We just do not know when this will happen.  Again, I am sure that her iron is can be quite low.  We may have to try to give her a little more iron the next time that we see her.  I think when to follow her liver more closely.  I want to get her back for the Holiday season.  I want to make sure that we optimize her blood as much as possible so that she will feel okay over the holidays and can enjoy all of the holiday festivities.   Josph Macho, MD 9/11/202411:47 AM

## 2023-01-18 ENCOUNTER — Encounter: Payer: Self-pay | Admitting: Hematology & Oncology

## 2023-01-18 ENCOUNTER — Telehealth: Payer: Self-pay

## 2023-01-18 NOTE — Telephone Encounter (Signed)
Called patient to inform her that her iron is incredibly low and she will need IV iron per Dr. Myna Hidalgo.  Dr. Myna Hidalgo would prefer Monoferric, but patient's insurance would not cover it. Dr. Myna Hidalgo decided for two doses of Feraheme. Amber from the scheduling department was notified since she was on the other line with the patient.

## 2023-01-18 NOTE — Telephone Encounter (Signed)
-----   Message from Josph Macho sent at 01/17/2023  6:06 PM EDT ----- Please call and let her know that the iron is still incredibly low.  We really need to give her IV iron.  Is her anyway we can give her Monoferric?  Please call her and have her come in at her convenience for IV iron.  Thanks.  Cindee Lame

## 2023-01-18 NOTE — Addendum Note (Signed)
Addended by: Arlan Organ R on: 01/18/2023 02:41 PM   Modules accepted: Orders

## 2023-01-22 ENCOUNTER — Inpatient Hospital Stay: Payer: BC Managed Care – PPO

## 2023-01-22 VITALS — BP 102/72 | HR 63 | Temp 98.0°F | Resp 17

## 2023-01-22 DIAGNOSIS — D500 Iron deficiency anemia secondary to blood loss (chronic): Secondary | ICD-10-CM | POA: Diagnosis not present

## 2023-01-22 DIAGNOSIS — N920 Excessive and frequent menstruation with regular cycle: Secondary | ICD-10-CM

## 2023-01-22 MED ORDER — ACETAMINOPHEN 325 MG PO TABS: 650.0000 mg | ORAL_TABLET | Freq: Once | ORAL | Status: DC

## 2023-01-22 MED ORDER — LORATADINE 10 MG PO TABS
10.0000 mg | ORAL_TABLET | Freq: Once | ORAL | Status: DC
  Filled 2023-01-22: qty 1

## 2023-01-22 MED ORDER — SODIUM CHLORIDE 0.9 % IV SOLN
510.0000 mg | Freq: Once | INTRAVENOUS | Status: AC
  Administered 2023-01-22: 510 mg via INTRAVENOUS
  Filled 2023-01-22: qty 17

## 2023-01-22 MED ORDER — SODIUM CHLORIDE 0.9 % IV SOLN: Freq: Once | INTRAVENOUS | Status: AC

## 2023-01-25 ENCOUNTER — Encounter: Payer: Self-pay | Admitting: Nurse Practitioner

## 2023-01-25 ENCOUNTER — Ambulatory Visit: Payer: BC Managed Care – PPO | Admitting: Nurse Practitioner

## 2023-01-25 VITALS — BP 108/70 | HR 72 | Temp 98.1°F | Ht 66.0 in | Wt 185.0 lb

## 2023-01-25 DIAGNOSIS — R632 Polyphagia: Secondary | ICD-10-CM | POA: Diagnosis not present

## 2023-01-25 DIAGNOSIS — E669 Obesity, unspecified: Secondary | ICD-10-CM | POA: Diagnosis not present

## 2023-01-25 DIAGNOSIS — Z6829 Body mass index (BMI) 29.0-29.9, adult: Secondary | ICD-10-CM | POA: Diagnosis not present

## 2023-01-25 MED ORDER — LIRAGLUTIDE 18 MG/3ML ~~LOC~~ SOPN
0.6000 mg | PEN_INJECTOR | Freq: Every day | SUBCUTANEOUS | 0 refills | Status: DC
Start: 2023-01-25 — End: 2023-03-08

## 2023-01-25 NOTE — Progress Notes (Signed)
Office: [phone]  /  Fax: [phone]  WEIGHT SUMMARY AND BIOMETRICS  Weight Lost Since Last Visit: 4lb  Weight Gained Since Last Visit: 0lb   Vitals Temp: 98.1 F (36.7 C) BP: 108/70 Pulse Rate: 72 SpO2: 100 %   Anthropometric Measurements Height: 5\' 6"  (1.676 m) Weight: 185 lb (83.9 kg) BMI (Calculated): 29.87 Weight at Last Visit: 189lb Weight Lost Since Last Visit: 4lb Weight Gained Since Last Visit: 0lb Starting Weight: 205lb Total Weight Loss (lbs): 20 lb (9.072 kg)   Body Composition  Body Fat %: 35.5 % Fat Mass (lbs): 65.6 lbs Muscle Mass (lbs): 113.4 lbs Total Body Water (lbs): 78 lbs Visceral Fat Rating : 7   Other Clinical Data Fasting: Yes Labs: No Today's Visit #: 20 Starting Date: 06/13/18     HPI  Chief Complaint: OBESITY  Tamara Ball is here to discuss her progress with her obesity treatment plan. She is on the the Category 3 Plan and states she is following her eating plan approximately 85 % of the time. She states she is exercising 45-90 minutes 4-5 days per week-HIT, resistance training.   Interval History:  Since last office visit she has lost 4 pounds.  She is eating 3 meals per day and is aiming to eat a protein with each meal.  She is drinking water and is working on drinking more water.  Denies sugary drinks.     Pharmacotherapy for weight loss: She is currently taking  Victoza 0.6mg   off label for medical weight loss.  Denies side effects.  Feels that it sometimes work and sometimes it doesn't help polyphagia.   Previous pharmacotherapy for medical weight loss:  Ozempic, Saxenda, Wellbutrin, Topamax and Rybelsus.   Bariatric surgery:  Patient has not had bariatric surgery     PHYSICAL EXAM:  Blood pressure 108/70, pulse 72, temperature 98.1 F (36.7 C), height 5\' 6"  (1.676 m), weight 185 lb (83.9 kg), last menstrual period 01/06/2023, SpO2 100%. Body mass index is 29.86 kg/m.  General: She is overweight, cooperative,  alert, well developed, and in no acute distress. PSYCH: Has normal mood, affect and thought process.   Extremities: No edema.  Neurologic: No gross sensory or motor deficits. No tremors or fasciculations noted.    DIAGNOSTIC DATA REVIEWED:  BMET    Component Value Date/Time   NA 142 07/19/2022 0847   K 4.2 07/19/2022 0847   K 4.2 08/11/2016 1404   CL 108 (H) 07/19/2022 0847   CL 103 08/11/2016 1404   CO2 21 07/19/2022 0847   CO2 26 08/11/2016 1404   GLUCOSE 80 07/19/2022 0847   GLUCOSE 90 02/24/2021 0720   BUN 11 07/19/2022 0847   CREATININE 0.82 07/19/2022 0847   CREATININE 0.87 08/11/2016 1404   CALCIUM 9.1 07/19/2022 0847   CALCIUM 9.5 08/11/2016 1404   GFRNONAA >60 06/20/2020 0405   GFRAA 83 01/27/2020 0844   Lab Results  Component Value Date   HGBA1C 5.4 07/19/2022   HGBA1C 5.0 04/07/2015   Lab Results  Component Value Date   INSULIN 10.2 07/19/2022   INSULIN 8.9 06/13/2018   Lab Results  Component Value Date   TSH 2.170 07/19/2022   CBC    Component Value Date/Time   WBC 7.2 01/17/2023 0948   WBC 9.4 06/20/2020 0405   RBC 4.73 01/17/2023 0949   RBC 4.73 01/17/2023 0948   HGB 9.9 (L) 01/17/2023 0948   HGB 11.4 01/05/2022 1021   HGB 14.2 04/20/2017 1524   HCT 34.8 (  L) 01/17/2023 0948   HCT 36.3 01/05/2022 1021   HCT 43.2 04/20/2017 1524   PLT 246 01/17/2023 0948   PLT 258 01/05/2022 1021   MCV 73.6 (L) 01/17/2023 0948   MCV 80 01/05/2022 1021   MCV 90 04/20/2017 1524   MCH 20.9 (L) 01/17/2023 0948   MCHC 28.4 (L) 01/17/2023 0948   RDW 16.9 (H) 01/17/2023 0948   RDW 13.7 01/05/2022 1021   RDW 13.5 04/20/2017 1524   Iron Studies    Component Value Date/Time   IRON 27 (L) 01/17/2023 0948   IRON 37 01/05/2022 1021   IRON 103 04/20/2017 1524   TIBC 588 (H) 01/17/2023 0948   TIBC 450 (H) 04/20/2017 1524   FERRITIN 4 (L) 01/17/2023 0949   FERRITIN 9 (L) 01/05/2022 1021   FERRITIN 23 04/20/2017 1524   IRONPCTSAT 5 (L) 01/17/2023 0948    IRONPCTSAT 23 04/20/2017 1524   IRONPCTSAT 18 (L) 10/30/2014 1213   Lipid Panel     Component Value Date/Time   CHOL 126 01/05/2022 1021   TRIG 31 01/05/2022 1021   HDL 62 01/05/2022 1021   CHOLHDL 2.2 07/13/2020 1421   CHOLHDL 2 05/27/2018 0848   VLDL 10.4 05/27/2018 0848   LDLCALC 55 01/05/2022 1021   Hepatic Function Panel     Component Value Date/Time   PROT 6.5 07/19/2022 0847   ALBUMIN 4.1 07/19/2022 0847   ALBUMIN 4.2 08/11/2016 1404   AST 16 07/19/2022 0847   AST 9 08/11/2016 1404   ALT 15 07/19/2022 0847   ALT 8 08/11/2016 1404   ALKPHOS 69 07/19/2022 0847   ALKPHOS 58 08/11/2016 1404   BILITOT 0.3 07/19/2022 0847   BILIDIR 0.0 06/24/2012 1055      Component Value Date/Time   TSH 2.170 07/19/2022 0847   Nutritional Lab Results  Component Value Date   VD25OH 51.3 07/19/2022   VD25OH 37.3 01/05/2022   VD25OH 45.01 02/24/2021     ASSESSMENT AND PLAN  TREATMENT PLAN FOR OBESITY:  Recommended Dietary Goals  Tamara Ball is currently in the action stage of change. As such, her goal is to continue weight management plan. She has agreed to the Category 3 Plan.  Add protein shake after exercise.   Behavioral Intervention  We discussed the following Behavioral Modification Strategies today: increasing lean protein intake, decreasing simple carbohydrates , increasing vegetables, increasing lower glycemic fruits, increasing water intake, work on meal planning and preparation, work on tracking and journaling calories using tracking application, continue to practice mindfulness when eating, and planning for success.  Additional resources provided today: NA  Recommended Physical Activity Goals  Tamara Ball has been advised to work up to 150 minutes of moderate intensity aerobic activity a week and strengthening exercises 2-3 times per week for cardiovascular health, weight loss maintenance and preservation of muscle mass.   She has agreed to Continue current level of  physical activity    Pharmacotherapy We discussed various medication options to help [facility] with her weight loss efforts and we both agreed to continue Victoza 0.6mg  off label for weight loss.  Side effects discussed.  ASSOCIATED CONDITIONS ADDRESSED TODAY  Action/Plan  Polyphagia -     Liraglutide; Inject 0.6 mg into the skin daily.  Dispense: 3 mL; Refill: 0  Generalized obesity -     Liraglutide; Inject 0.6 mg into the skin daily.  Dispense: 3 mL; Refill: 0  BMI 29.0-29.9,adult       She is scheduled for labs in November  Return in  about 4 weeks (around 02/22/2023).Marland Kitchen She was informed of the importance of frequent follow up visits to maximize her success with intensive lifestyle modifications for her multiple health conditions.   ATTESTASTION STATEMENTS:  Reviewed by clinician on day of visit: allergies, medications, problem list, medical history, surgical history, family history, social history, and previous encounter notes.     Theodis Sato. Lorelei Heikkila FNP-C

## 2023-01-29 ENCOUNTER — Inpatient Hospital Stay: Payer: BC Managed Care – PPO

## 2023-01-29 VITALS — BP 109/65 | HR 73 | Temp 98.3°F | Resp 17

## 2023-01-29 DIAGNOSIS — D500 Iron deficiency anemia secondary to blood loss (chronic): Secondary | ICD-10-CM | POA: Diagnosis not present

## 2023-01-29 DIAGNOSIS — N920 Excessive and frequent menstruation with regular cycle: Secondary | ICD-10-CM

## 2023-01-29 MED ORDER — LORATADINE 10 MG PO TABS
10.0000 mg | ORAL_TABLET | Freq: Once | ORAL | Status: DC
  Filled 2023-01-29: qty 1

## 2023-01-29 MED ORDER — ACETAMINOPHEN 325 MG PO TABS: 650.0000 mg | ORAL_TABLET | Freq: Once | ORAL | Status: DC

## 2023-01-29 MED ORDER — SODIUM CHLORIDE 0.9 % IV SOLN
510.0000 mg | Freq: Once | INTRAVENOUS | Status: AC
  Administered 2023-01-29: 510 mg via INTRAVENOUS
  Filled 2023-01-29: qty 17

## 2023-01-29 MED ORDER — SODIUM CHLORIDE 0.9 % IV SOLN: Freq: Once | INTRAVENOUS | Status: AC

## 2023-01-29 NOTE — Progress Notes (Signed)
Pt declined to stay for post infusion observation period. Pt stated she has tolerated medication multiple times prior without difficulty. Pt aware to call clinic with any questions or concerns. Pt verbalized understanding and had no further questions.  ? ?

## 2023-01-29 NOTE — Patient Instructions (Signed)
 Ferumoxytol Injection What is this medication? FERUMOXYTOL (FER ue MOX i tol) treats low levels of iron in your body (iron deficiency anemia). Iron is a mineral that plays an important role in making red blood cells, which carry oxygen from your lungs to the rest of your body. This medicine may be used for other purposes; ask your health care provider or pharmacist if you have questions. COMMON BRAND NAME(S): Feraheme What should I tell my care team before I take this medication? They need to know if you have any of these conditions: Anemia not caused by low iron levels High levels of iron in the blood Magnetic resonance imaging (MRI) test scheduled An unusual or allergic reaction to iron, other medications, foods, dyes, or preservatives Pregnant or trying to get pregnant Breastfeeding How should I use this medication? This medication is injected into a vein. It is given by your care team in a hospital or clinic setting. Talk to your care team the use of this medication in children. Special care may be needed. Overdosage: If you think you have taken too much of this medicine contact a poison control center or emergency room at once. NOTE: This medicine is only for you. Do not share this medicine with others. What if I miss a dose? It is important not to miss your dose. Call your care team if you are unable to keep an appointment. What may interact with this medication? Other iron products This list may not describe all possible interactions. Give your health care provider a list of all the medicines, herbs, non-prescription drugs, or dietary supplements you use. Also tell them if you smoke, drink alcohol, or use illegal drugs. Some items may interact with your medicine. What should I watch for while using this medication? Visit your care team regularly. Tell your care team if your symptoms do not start to get better or if they get worse. You may need blood work done while you are taking this  medication. You may need to follow a special diet. Talk to your care team. Foods that contain iron include: whole grains/cereals, dried fruits, beans, or peas, leafy green vegetables, and organ meats (liver, kidney). What side effects may I notice from receiving this medication? Side effects that you should report to your care team as soon as possible: Allergic reactions--skin rash, itching, hives, swelling of the face, lips, tongue, or throat Low blood pressure--dizziness, feeling faint or lightheaded, blurry vision Shortness of breath Side effects that usually do not require medical attention (report to your care team if they continue or are bothersome): Flushing Headache Joint pain Muscle pain Nausea Pain, redness, or irritation at injection site This list may not describe all possible side effects. Call your doctor for medical advice about side effects. You may report side effects to FDA at 1-800-FDA-1088. Where should I keep my medication? This medication is given in a hospital or clinic. It will not be stored at home. NOTE: This sheet is a summary. It may not cover all possible information. If you have questions about this medicine, talk to your doctor, pharmacist, or health care provider.  2024 Elsevier/Gold Standard (2022-09-29 00:00:00)

## 2023-02-02 ENCOUNTER — Ambulatory Visit: Payer: BC Managed Care – PPO | Admitting: Allergy

## 2023-03-02 ENCOUNTER — Ambulatory Visit: Payer: BC Managed Care – PPO | Admitting: Cardiology

## 2023-03-08 ENCOUNTER — Ambulatory Visit: Payer: BC Managed Care – PPO | Admitting: Nurse Practitioner

## 2023-03-08 ENCOUNTER — Encounter: Payer: Self-pay | Admitting: Nurse Practitioner

## 2023-03-08 VITALS — BP 117/69 | HR 77 | Temp 98.2°F | Ht 66.0 in | Wt 188.0 lb

## 2023-03-08 DIAGNOSIS — R632 Polyphagia: Secondary | ICD-10-CM | POA: Diagnosis not present

## 2023-03-08 DIAGNOSIS — E669 Obesity, unspecified: Secondary | ICD-10-CM

## 2023-03-08 DIAGNOSIS — Z6830 Body mass index (BMI) 30.0-30.9, adult: Secondary | ICD-10-CM

## 2023-03-08 MED ORDER — LIRAGLUTIDE 18 MG/3ML ~~LOC~~ SOPN: 0.6000 mg | PEN_INJECTOR | Freq: Every day | SUBCUTANEOUS | 0 refills | Status: DC

## 2023-03-08 MED ORDER — INSULIN SYRINGE-NEEDLE U-100 31G X 5/16" 0.3 ML MISC
0 refills | Status: DC
Start: 2023-03-08 — End: 2023-07-26

## 2023-03-08 NOTE — Patient Instructions (Signed)

## 2023-03-08 NOTE — Progress Notes (Signed)
Office: [phone]  /  Fax: [phone]  WEIGHT SUMMARY AND BIOMETRICS  Weight Lost Since Last Visit: 0lb  Weight Gained Since Last Visit: 3lb   Vitals Temp: 98.2 F (36.8 C) BP: 117/69 Pulse Rate: 77 SpO2: 100 %   Anthropometric Measurements Height: 5\' 6"  (1.676 m) Weight: 188 lb (85.3 kg) BMI (Calculated): 30.36 Weight at Last Visit: 185lb Weight Lost Since Last Visit: 0lb Weight Gained Since Last Visit: 3lb Starting Weight: 205lb Total Weight Loss (lbs): 17 lb (7.711 kg)   Body Composition  Body Fat %: 37.3 % Fat Mass (lbs): 70.4 lbs Muscle Mass (lbs): 112.2 lbs Total Body Water (lbs): 78.2 lbs Visceral Fat Rating : 8   Other Clinical Data Fasting: No Labs: No Today's Visit #: 17 Starting Date: 06/13/18     HPI  Chief Complaint: OBESITY  Tamara Ball is here to discuss her progress with her obesity treatment plan. She is on the the Category 3 Plan and states she is following her eating plan approximately 50 % of the time. She states she is exercising 30-60 minutes 3-5 days per week.   Interval History:  Since last office visit she has gained 3 pounds.    Goal weight 170-175 lbs  Pharmacotherapy for weight loss: She is currently taking  Victoza 1.2 mg off label for medical weight loss (hasn't been taking consistently due to traveling).  Denies side effects.  Feels that it sometimes work and sometimes it doesn't help polyphagia or cravings.     Previous pharmacotherapy for medical weight loss:  Ozempic, Saxenda, Wellbutrin, Wegovy and Rybelsus. Felt that Ozempic worked the best for her but is unable to take due to cost.     Bariatric surgery:  Patient has not had bariatric surgery     PHYSICAL EXAM:  Blood pressure 117/69, pulse 77, temperature 98.2 F (36.8 C), height 5\' 6"  (1.676 m), weight 188 lb (85.3 kg), last menstrual period 02/15/2023, SpO2 100%. Body mass index is 30.34 kg/m.  General: She is overweight, cooperative, alert, well  developed, and in no acute distress. PSYCH: Has normal mood, affect and thought process.   Extremities: No edema.  Neurologic: No gross sensory or motor deficits. No tremors or fasciculations noted.    DIAGNOSTIC DATA REVIEWED:  BMET    Component Value Date/Time   NA 142 07/19/2022 0847   K 4.2 07/19/2022 0847   K 4.2 08/11/2016 1404   CL 108 (H) 07/19/2022 0847   CL 103 08/11/2016 1404   CO2 21 07/19/2022 0847   CO2 26 08/11/2016 1404   GLUCOSE 80 07/19/2022 0847   GLUCOSE 90 02/24/2021 0720   BUN 11 07/19/2022 0847   CREATININE 0.82 07/19/2022 0847   CREATININE 0.87 08/11/2016 1404   CALCIUM 9.1 07/19/2022 0847   CALCIUM 9.5 08/11/2016 1404   GFRNONAA >60 06/20/2020 0405   GFRAA 83 01/27/2020 0844   Lab Results  Component Value Date   HGBA1C 5.4 07/19/2022   HGBA1C 5.0 04/07/2015   Lab Results  Component Value Date   INSULIN 10.2 07/19/2022   INSULIN 8.9 06/13/2018   Lab Results  Component Value Date   TSH 2.170 07/19/2022   CBC    Component Value Date/Time   WBC 7.2 01/17/2023 0948   WBC 9.4 06/20/2020 0405   RBC 4.73 01/17/2023 0949   RBC 4.73 01/17/2023 0948   HGB 9.9 (L) 01/17/2023 0948   HGB 11.4 01/05/2022 1021   HGB 14.2 04/20/2017 1524   HCT 34.8 (L) 01/17/2023 2440  HCT 36.3 01/05/2022 1021   HCT 43.2 04/20/2017 1524   PLT 246 01/17/2023 0948   PLT 258 01/05/2022 1021   MCV 73.6 (L) 01/17/2023 0948   MCV 80 01/05/2022 1021   MCV 90 04/20/2017 1524   MCH 20.9 (L) 01/17/2023 0948   MCHC 28.4 (L) 01/17/2023 0948   RDW 16.9 (H) 01/17/2023 0948   RDW 13.7 01/05/2022 1021   RDW 13.5 04/20/2017 1524   Iron Studies    Component Value Date/Time   IRON 27 (L) 01/17/2023 0948   IRON 37 01/05/2022 1021   IRON 103 04/20/2017 1524   TIBC 588 (H) 01/17/2023 0948   TIBC 450 (H) 04/20/2017 1524   FERRITIN 4 (L) 01/17/2023 0949   FERRITIN 9 (L) 01/05/2022 1021   FERRITIN 23 04/20/2017 1524   IRONPCTSAT 5 (L) 01/17/2023 0948   IRONPCTSAT 23  04/20/2017 1524   IRONPCTSAT 18 (L) 10/30/2014 1213   Lipid Panel     Component Value Date/Time   CHOL 126 01/05/2022 1021   TRIG 31 01/05/2022 1021   HDL 62 01/05/2022 1021   CHOLHDL 2.2 07/13/2020 1421   CHOLHDL 2 05/27/2018 0848   VLDL 10.4 05/27/2018 0848   LDLCALC 55 01/05/2022 1021   Hepatic Function Panel     Component Value Date/Time   PROT 6.5 07/19/2022 0847   ALBUMIN 4.1 07/19/2022 0847   ALBUMIN 4.2 08/11/2016 1404   AST 16 07/19/2022 0847   AST 9 08/11/2016 1404   ALT 15 07/19/2022 0847   ALT 8 08/11/2016 1404   ALKPHOS 69 07/19/2022 0847   ALKPHOS 58 08/11/2016 1404   BILITOT 0.3 07/19/2022 0847   BILIDIR 0.0 06/24/2012 1055      Component Value Date/Time   TSH 2.170 07/19/2022 0847   Nutritional Lab Results  Component Value Date   VD25OH 51.3 07/19/2022   VD25OH 37.3 01/05/2022   VD25OH 45.01 02/24/2021     ASSESSMENT AND PLAN  TREATMENT PLAN FOR OBESITY:  Recommended Dietary Goals  Tamara Ball is currently in the action stage of change. As such, her goal is to continue weight management plan. She has agreed to the Category 3 Plan.  Behavioral Intervention  We discussed the following Behavioral Modification Strategies today: continue to work on maintaining a reduced calorie state, getting the recommended amount of protein, incorporating whole foods, making healthy choices, staying well hydrated and practicing mindfulness when eating..  Additional resources provided today: NA  Recommended Physical Activity Goals  Tamara Ball has been advised to work up to 150 minutes of moderate intensity aerobic activity a week and strengthening exercises 2-3 times per week for cardiovascular health, weight loss maintenance and preservation of muscle mass.   She has agreed to Continue current level of physical activity    Pharmacotherapy We discussed various medication options to help [facility] with her weight loss efforts and we both agreed to continue Victoza 0.6mg   off label for weight loss.  Side effects discussed.  ASSOCIATED CONDITIONS ADDRESSED TODAY  Action/Plan  Polyphagia -     Continue Liraglutide; Inject 0.6 mg into the skin daily.  Dispense: 3 mL; Refill: 0  Generalized obesity -     Liraglutide; Inject 0.6 mg into the skin daily.  Dispense: 3 mL; Refill: 0 -     Insulin Syringe-Needle U-100; Take as directed with Victoza.  Dispense: 10 each; Refill: 0  BMI 30.0-30.9,adult      She is scheduled for labs in November      Return in about 4  weeks (around 04/05/2023).Marland Kitchen She was informed of the importance of frequent follow up visits to maximize her success with intensive lifestyle modifications for her multiple health conditions.   ATTESTASTION STATEMENTS:  Reviewed by clinician on day of visit: allergies, medications, problem list, medical history, surgical history, family history, social history, and previous encounter notes.     Theodis Sato. Aryaan Persichetti FNP-C

## 2023-03-26 ENCOUNTER — Inpatient Hospital Stay: Payer: BC Managed Care – PPO | Attending: Family

## 2023-03-26 ENCOUNTER — Inpatient Hospital Stay (HOSPITAL_BASED_OUTPATIENT_CLINIC_OR_DEPARTMENT_OTHER): Payer: BC Managed Care – PPO | Admitting: Hematology & Oncology

## 2023-03-26 ENCOUNTER — Encounter: Payer: Self-pay | Admitting: Hematology & Oncology

## 2023-03-26 ENCOUNTER — Other Ambulatory Visit: Payer: Self-pay

## 2023-03-26 VITALS — BP 106/77 | HR 67 | Temp 98.3°F | Resp 16 | Ht 66.0 in | Wt 197.0 lb

## 2023-03-26 DIAGNOSIS — D259 Leiomyoma of uterus, unspecified: Secondary | ICD-10-CM | POA: Diagnosis not present

## 2023-03-26 DIAGNOSIS — N920 Excessive and frequent menstruation with regular cycle: Secondary | ICD-10-CM | POA: Diagnosis not present

## 2023-03-26 DIAGNOSIS — D500 Iron deficiency anemia secondary to blood loss (chronic): Secondary | ICD-10-CM | POA: Insufficient documentation

## 2023-03-26 LAB — CMP (CANCER CENTER ONLY)
ALT: 11 U/L (ref 0–44)
AST: 20 U/L (ref 15–41)
Albumin: 3.9 g/dL (ref 3.5–5.0)
Alkaline Phosphatase: 58 U/L (ref 38–126)
Anion gap: 5 (ref 5–15)
BUN: 15 mg/dL (ref 6–20)
CO2: 26 mmol/L (ref 22–32)
Calcium: 9.5 mg/dL (ref 8.9–10.3)
Chloride: 106 mmol/L (ref 98–111)
Creatinine: 1.02 mg/dL — ABNORMAL HIGH (ref 0.44–1.00)
GFR, Estimated: >60 mL/min (ref >60)
Glucose, Bld: 94 mg/dL (ref 70–99)
Potassium: 4.8 mmol/L (ref 3.5–5.1)
Sodium: 137 mmol/L (ref 135–145)
Total Bilirubin: 0.5 mg/dL (ref <1.2)
Total Protein: 7.3 g/dL (ref 6.5–8.1)

## 2023-03-26 LAB — CBC WITH DIFFERENTIAL (CANCER CENTER ONLY)
Abs Immature Granulocytes: 0.02 10*3/uL (ref 0.00–0.07)
Basophils Absolute: 0.1 10*3/uL (ref 0.0–0.1)
Basophils Relative: 1 %
Eosinophils Absolute: 0.4 10*3/uL (ref 0.0–0.5)
Eosinophils Relative: 6 %
HCT: 43 % (ref 36.0–46.0)
Hemoglobin: 13.9 g/dL (ref 12.0–15.0)
Immature Granulocytes: 0 %
Lymphocytes Relative: 25 %
Lymphs Abs: 1.6 10*3/uL (ref 0.7–4.0)
MCH: 27.8 pg (ref 26.0–34.0)
MCHC: 32.3 g/dL (ref 30.0–36.0)
MCV: 86 fL (ref 80.0–100.0)
Monocytes Absolute: 0.5 10*3/uL (ref 0.1–1.0)
Monocytes Relative: 8 %
Neutro Abs: 3.7 10*3/uL (ref 1.7–7.7)
Neutrophils Relative %: 60 %
Platelet Count: 199 10*3/uL (ref 150–400)
RBC: 5 MIL/uL (ref 3.87–5.11)
RDW: 21.1 % — ABNORMAL HIGH (ref 11.5–15.5)
WBC Count: 6.2 10*3/uL (ref 4.0–10.5)
nRBC: 0 % (ref 0.0–0.2)

## 2023-03-26 LAB — IRON AND IRON BINDING CAPACITY (CC-WL,HP ONLY)
Iron: 49 ug/dL (ref 28–170)
Saturation Ratios: 10 % — ABNORMAL LOW (ref 10.4–31.8)
TIBC: 504 ug/dL — ABNORMAL HIGH (ref 250–450)
UIBC: 455 ug/dL — ABNORMAL HIGH (ref 148–442)

## 2023-03-26 LAB — RETICULOCYTES
Immature Retic Fract: 10.7 % (ref 2.3–15.9)
RBC.: 5.01 MIL/uL (ref 3.87–5.11)
Retic Count, Absolute: 61.6 10*3/uL (ref 19.0–186.0)
Retic Ct Pct: 1.2 % (ref 0.4–3.1)

## 2023-03-26 LAB — FERRITIN: Ferritin: 12 ng/mL (ref 11–307)

## 2023-03-26 NOTE — Progress Notes (Signed)
Hematology and Oncology Follow Up Visit  Tamara Ball [id number] 11-03-1980 42 y.o. 03/26/2023   Principle Diagnosis:  Iron deficiency anemia secondary to menorrhagia    Current Therapy:        IV iron as indicated  --Feraheme given on 01/22/2023    Interim History:  Tamara Ball is here today for follow-up.  When we last saw her back in September, her ferritin was only 4 with an iron saturation of 5%.  She is working.  She can ready for the holidays.  It sounds like she will be with family in the [place] of [place].  She did receive some Feraheme back in September..  She has not had any procedures yet for the fibroids.  She will have to see her gynecologist.  She has had no problems with bleeding otherwise.  There is been no nausea or vomiting.  She has had no cough or shortness of breath.  She has had no leg swelling.  She has had no rashes.  She has had no fever.  Overall, I would say that her performance status is probably ECOG 0.   Medications:  Allergies as of 03/26/2023   No Known Allergies      Medication List        Accurate as of March 26, 2023  8:22 AM. If you have any questions, ask your nurse or doctor.          b complex vitamins tablet Take 1 tablet by mouth daily.   BENADRYL PO Take by mouth as needed.   Biotin 40102 MCG Tabs Take 1 tablet by mouth daily.   Fish Oil 1000 MG Caps Take 1 capsule by mouth daily.   fluticasone 50 MCG/ACT nasal spray Commonly known as: FLONASE Place 2 sprays into both nostrils daily.   Insulin Syringe-Needle U-100 31G X 5/16" 0.3 ML Misc Commonly known as: B-D INS SYR ULTRAFINE .3CC/31G Take as directed with Victoza.   levocetirizine 5 MG tablet Commonly known as: XYZAL Take 1 tablet (5 mg total) by mouth every evening.   liraglutide 18 MG/3ML Sopn Commonly known as: VICTOZA Inject 0.6 mg into the skin daily.   montelukast 10 MG tablet Commonly known as: SINGULAIR Take 1 tablet (10 mg  total) by mouth at bedtime.   multivitamin with minerals Tabs tablet Take 1 tablet by mouth daily.   polyethylene glycol 17 g packet Commonly known as: MIRALAX / GLYCOLAX Take 17 g by mouth daily as needed.   VITAMIN D PO Take 10,000 Units by mouth once a week.        Allergies: No Known Allergies  Past Medical History, Surgical history, Social history, and Family History were reviewed and updated.  Review of Systems: Review of Systems  Constitutional:  Positive for malaise/fatigue.  HENT: Negative.    Eyes: Negative.   Respiratory: Negative.    Cardiovascular: Negative.   Gastrointestinal: Negative.   Genitourinary: Negative.   Musculoskeletal: Negative.   Skin: Negative.   Neurological: Negative.   Endo/Heme/Allergies:  Bruises/bleeds easily.  Psychiatric/Behavioral: Negative.       Physical Exam:  height is 5\' 6"  (1.676 m) and weight is 197 lb (89.4 kg). Her oral temperature is 98.3 F (36.8 C). Her blood pressure is 106/77 and her pulse is 67. Her respiration is 16 and oxygen saturation is 100%.   Wt Readings from Last 3 Encounters:  03/26/23 197 lb (89.4 kg)  03/08/23 188 lb (85.3 kg)  01/25/23 185 lb (83.9 kg)  Physical Exam Vitals reviewed.  HENT:     Head: Normocephalic and atraumatic.  Eyes:     Pupils: Pupils are equal, round, and reactive to light.  Cardiovascular:     Rate and Rhythm: Normal rate and regular rhythm.     Heart sounds: Normal heart sounds.  Pulmonary:     Effort: Pulmonary effort is normal.     Breath sounds: Normal breath sounds.  Abdominal:     General: Bowel sounds are normal.     Palpations: Abdomen is soft.  Musculoskeletal:        General: No tenderness or deformity. Normal range of motion.     Cervical back: Normal range of motion.  Lymphadenopathy:     Cervical: No cervical adenopathy.  Skin:    General: Skin is warm and dry.     Findings: No erythema or rash.  Neurological:     Mental Status: She is alert and  oriented to person, place, and time.  Psychiatric:        Behavior: Behavior normal.        Thought Content: Thought content normal.        Judgment: Judgment normal.       Lab Results  Component Value Date   WBC 6.2 03/26/2023   HGB 13.9 03/26/2023   HCT 43.0 03/26/2023   MCV 86.0 03/26/2023   PLT 199 03/26/2023   Lab Results  Component Value Date   FERRITIN 4 (L) 01/17/2023   IRON 27 (L) 01/17/2023   TIBC 588 (H) 01/17/2023   UIBC 561 (H) 01/17/2023   IRONPCTSAT 5 (L) 01/17/2023   Lab Results  Component Value Date   RETICCTPCT 1.3 01/17/2023   RBC 5.00 03/26/2023   RETICCTABS 64,400 05/16/2016   No results found for: "KPAFRELGTCHN", "LAMBDASER", "KAPLAMBRATIO" No results found for: "IGGSERUM", "IGA", "IGMSERUM" No results found for: "TOTALPROTELP", "ALBUMINELP", "A1GS", "A2GS", "BETS", "BETA2SER", "GAMS", "MSPIKE", "SPEI"   Chemistry      Component Value Date/Time   NA 142 07/19/2022 0847   K 4.2 07/19/2022 0847   K 4.2 08/11/2016 1404   CL 108 (H) 07/19/2022 0847   CL 103 08/11/2016 1404   CO2 21 07/19/2022 0847   CO2 26 08/11/2016 1404   BUN 11 07/19/2022 0847   CREATININE 0.82 07/19/2022 0847   CREATININE 0.87 08/11/2016 1404      Component Value Date/Time   CALCIUM 9.1 07/19/2022 0847   CALCIUM 9.5 08/11/2016 1404   ALKPHOS 69 07/19/2022 0847   ALKPHOS 58 08/11/2016 1404   AST 16 07/19/2022 0847   AST 9 08/11/2016 1404   ALT 15 07/19/2022 0847   ALT 8 08/11/2016 1404   BILITOT 0.3 07/19/2022 0847       Impression and Plan: Tamara Ball is a very pleasant 42 yo African American female with iron deficiency secondary to heavy cycles.  Again, she has fibroids.  She is awaiting to hear back from the gynecologist and insurance regarding a procedure.  I am just happy that her hemoglobin is gone so up.  Her MCV is also gone up nicely.  I think we get her through the Holiday season now.  We will get her back in 3 months, or sooner if there is a problem.     Josph Macho, MD 11/18/20248:22 AM

## 2023-03-27 ENCOUNTER — Encounter: Payer: Self-pay | Admitting: Nurse Practitioner

## 2023-03-28 ENCOUNTER — Other Ambulatory Visit: Payer: Self-pay | Admitting: Nurse Practitioner

## 2023-03-28 DIAGNOSIS — R632 Polyphagia: Secondary | ICD-10-CM

## 2023-03-28 DIAGNOSIS — E669 Obesity, unspecified: Secondary | ICD-10-CM

## 2023-04-02 ENCOUNTER — Other Ambulatory Visit: Payer: Self-pay | Admitting: Nurse Practitioner

## 2023-04-02 DIAGNOSIS — E669 Obesity, unspecified: Secondary | ICD-10-CM

## 2023-04-02 DIAGNOSIS — R632 Polyphagia: Secondary | ICD-10-CM

## 2023-04-02 MED ORDER — LIRAGLUTIDE 18 MG/3ML ~~LOC~~ SOPN: 1.2000 mg | PEN_INJECTOR | Freq: Every day | SUBCUTANEOUS | 0 refills | Status: DC

## 2023-04-03 ENCOUNTER — Other Ambulatory Visit: Payer: Self-pay | Admitting: Nurse Practitioner

## 2023-04-04 ENCOUNTER — Ambulatory Visit: Payer: BC Managed Care – PPO | Admitting: Nurse Practitioner

## 2023-04-04 ENCOUNTER — Encounter: Payer: Self-pay | Admitting: Nurse Practitioner

## 2023-04-04 VITALS — BP 113/75 | HR 74 | Temp 97.9°F | Ht 66.0 in | Wt 192.0 lb

## 2023-04-04 DIAGNOSIS — E669 Obesity, unspecified: Secondary | ICD-10-CM

## 2023-04-04 DIAGNOSIS — R7301 Impaired fasting glucose: Secondary | ICD-10-CM

## 2023-04-04 DIAGNOSIS — R632 Polyphagia: Secondary | ICD-10-CM

## 2023-04-04 DIAGNOSIS — Z6830 Body mass index (BMI) 30.0-30.9, adult: Secondary | ICD-10-CM

## 2023-04-04 MED ORDER — LIRAGLUTIDE 18 MG/3ML ~~LOC~~ SOPN: 1.2000 mg | PEN_INJECTOR | Freq: Every day | SUBCUTANEOUS | 0 refills | Status: DC

## 2023-04-04 NOTE — Progress Notes (Signed)
Office: [phone]  /  Fax: [phone]  WEIGHT SUMMARY AND BIOMETRICS  Weight Lost Since Last Visit: 0lb  Weight Gained Since Last Visit: 4lb   Vitals Temp: 97.9 F (36.6 C) BP: 113/75 Pulse Rate: 74 SpO2: 96 %   Anthropometric Measurements Height: 5\' 6"  (1.676 m) Weight: 192 lb (87.1 kg) BMI (Calculated): 31 Weight at Last Visit: 188lb Weight Lost Since Last Visit: 0lb Weight Gained Since Last Visit: 4lb Starting Weight: 205lb Total Weight Loss (lbs): 13 lb (5.897 kg)   Body Composition  Body Fat %: 36 % Fat Mass (lbs): 69.2 lbs Muscle Mass (lbs): 117 lbs Total Body Water (lbs): 78.6 lbs Visceral Fat Rating : 8   Other Clinical Data Fasting: No Labs: No Today's Visit #: 48 Starting Date: 06/13/18     HPI  Chief Complaint: OBESITY  Tamara Ball is here to discuss her progress with her obesity treatment plan. She is on the the Category 3 Plan and states she is following her eating plan approximately 50 % of the time. She states she is exercising 45-90 minutes 3-4 days per week.   Interval History:  Since last office visit she has gained 4 pounds.  She has been traveling some since her last visit. She is not snacking as much.  Her cravings have decreased since starting Victoza but still struggles with polyphagia and cravings especially sugar.  Doesn't help as much as Ozempic did in the past.  She is drinking water and a protein shake.   Pharmacotherapy for weight loss: She is currently taking Victoza 1.2 mg (increased today to 1.2) off label for medical weight loss (hasn't been taking consistently due to traveling).  Denies side effects.     Previous pharmacotherapy for medical weight loss:   Donella Stade, [place] and [facility].  Felt that Ozempic worked the best for her but is unable to take due to cost.     Bariatric surgery:  Patient has not had bariatric surgery    Insulin Resistance Last fasting insulin was 10.2. A1c was  5.4. Polyphagia:Yes Medication(s): Victoza 1.2mg   Lab Results  Component Value Date   HGBA1C 5.4 07/19/2022   HGBA1C 5.2 01/05/2022   HGBA1C 5.0 07/13/2020   HGBA1C 5.1 01/27/2020   HGBA1C 4.9 06/17/2019   Lab Results  Component Value Date   INSULIN 10.2 07/19/2022   INSULIN 6.0 01/05/2022   INSULIN 7.6 07/13/2020   INSULIN 12.6 01/27/2020   INSULIN 9.1 06/17/2019      PHYSICAL EXAM:  Blood pressure 113/75, pulse 74, temperature 97.9 F (36.6 C), height 5\' 6"  (1.676 m), weight 192 lb (87.1 kg), last menstrual period 03/28/2023, SpO2 96%. Body mass index is 30.99 kg/m.  General: She is overweight, cooperative, alert, well developed, and in no acute distress. PSYCH: Has normal mood, affect and thought process.   Extremities: No edema.  Neurologic: No gross sensory or motor deficits. No tremors or fasciculations noted.    DIAGNOSTIC DATA REVIEWED:  BMET    Component Value Date/Time   NA 137 03/26/2023 0753   NA 142 07/19/2022 0847   K 4.8 03/26/2023 0753   K 4.2 08/11/2016 1404   CL 106 03/26/2023 0753   CL 103 08/11/2016 1404   CO2 26 03/26/2023 0753   CO2 26 08/11/2016 1404   GLUCOSE 94 03/26/2023 0753   BUN 15 03/26/2023 0753   BUN 11 07/19/2022 0847   CREATININE 1.02 (H) 03/26/2023 0753   CREATININE 0.87 08/11/2016 1404   CALCIUM 9.5 03/26/2023 0753  CALCIUM 9.5 08/11/2016 1404   GFRNONAA >60 03/26/2023 0753   GFRAA 83 01/27/2020 0844   Lab Results  Component Value Date   HGBA1C 5.4 07/19/2022   HGBA1C 5.0 04/07/2015   Lab Results  Component Value Date   INSULIN 10.2 07/19/2022   INSULIN 8.9 06/13/2018   Lab Results  Component Value Date   TSH 2.170 07/19/2022   CBC    Component Value Date/Time   WBC 6.2 03/26/2023 0753   WBC 9.4 06/20/2020 0405   RBC 5.00 03/26/2023 0753   RBC 5.01 03/26/2023 0753   HGB 13.9 03/26/2023 0753   HGB 11.4 01/05/2022 1021   HGB 14.2 04/20/2017 1524   HCT 43.0 03/26/2023 0753   HCT 36.3 01/05/2022 1021    HCT 43.2 04/20/2017 1524   PLT 199 03/26/2023 0753   PLT 258 01/05/2022 1021   MCV 86.0 03/26/2023 0753   MCV 80 01/05/2022 1021   MCV 90 04/20/2017 1524   MCH 27.8 03/26/2023 0753   MCHC 32.3 03/26/2023 0753   RDW 21.1 (H) 03/26/2023 0753   RDW 13.7 01/05/2022 1021   RDW 13.5 04/20/2017 1524   Iron Studies    Component Value Date/Time   IRON 49 03/26/2023 0753   IRON 37 01/05/2022 1021   IRON 103 04/20/2017 1524   TIBC 504 (H) 03/26/2023 0753   TIBC 450 (H) 04/20/2017 1524   FERRITIN 12 03/26/2023 0753   FERRITIN 9 (L) 01/05/2022 1021   FERRITIN 23 04/20/2017 1524   IRONPCTSAT 10 (L) 03/26/2023 0753   IRONPCTSAT 23 04/20/2017 1524   IRONPCTSAT 18 (L) 10/30/2014 1213   Lipid Panel     Component Value Date/Time   CHOL 126 01/05/2022 1021   TRIG 31 01/05/2022 1021   HDL 62 01/05/2022 1021   CHOLHDL 2.2 07/13/2020 1421   CHOLHDL 2 05/27/2018 0848   VLDL 10.4 05/27/2018 0848   LDLCALC 55 01/05/2022 1021   Hepatic Function Panel     Component Value Date/Time   PROT 7.3 03/26/2023 0753   PROT 6.5 07/19/2022 0847   ALBUMIN 3.9 03/26/2023 0753   ALBUMIN 4.1 07/19/2022 0847   ALBUMIN 4.2 08/11/2016 1404   AST 20 03/26/2023 0753   ALT 11 03/26/2023 0753   ALKPHOS 58 03/26/2023 0753   ALKPHOS 58 08/11/2016 1404   BILITOT 0.5 03/26/2023 0753   BILIDIR 0.0 06/24/2012 1055      Component Value Date/Time   TSH 2.170 07/19/2022 0847   Nutritional Lab Results  Component Value Date   VD25OH 51.3 07/19/2022   VD25OH 37.3 01/05/2022   VD25OH 45.01 02/24/2021     ASSESSMENT AND PLAN  TREATMENT PLAN FOR OBESITY:  Recommended Dietary Goals  Vona is currently in the action stage of change. As such, her goal is to continue weight management plan. She has agreed to the Category 2 Plan.  Behavioral Intervention  We discussed the following Behavioral Modification Strategies today: celebration eating strategies and continue to work on maintaining a reduced calorie  state, getting the recommended amount of protein, incorporating whole foods, making healthy choices, staying well hydrated and practicing mindfulness when eating..  Additional resources provided today: NA  Recommended Physical Activity Goals  Mykaila has been advised to work up to 150 minutes of moderate intensity aerobic activity a week and strengthening exercises 2-3 times per week for cardiovascular health, weight loss maintenance and preservation of muscle mass.   She has agreed to Continue current level of physical activity    Pharmacotherapy We  discussed various medication options to help [facility] with her weight loss efforts and we both agreed to continue Victoza 1.2 mg off label for weight loss.  ASSOCIATED CONDITIONS ADDRESSED TODAY  Action/Plan  Impaired fasting glucose -     Comprehensive metabolic panel -     Hemoglobin A1c -     Insulin, random  Consider adding Metformin.  Information given and side effects discussed.   Polyphagia -     Liraglutide; Inject 1.2 mg into the skin daily.  Dispense: 3 mL; Refill: 0  Generalized obesity -     Liraglutide; Inject 1.2 mg into the skin daily.  Dispense: 3 mL; Refill: 0  BMI 30.0-30.9,adult        Return in about 4 weeks (around 05/02/2023).Marland Kitchen She was informed of the importance of frequent follow up visits to maximize her success with intensive lifestyle modifications for her multiple health conditions.   ATTESTASTION STATEMENTS:  Reviewed by clinician on day of visit: allergies, medications, problem list, medical history, surgical history, family history, social history, and previous encounter notes.    Theodis Sato. Levern Pitter FNP-C

## 2023-04-05 LAB — COMPREHENSIVE METABOLIC PANEL
ALT: 12 [IU]/L (ref 0–32)
AST: 16 [IU]/L (ref 0–40)
Albumin: 4.2 g/dL (ref 3.9–4.9)
Alkaline Phosphatase: 67 [IU]/L (ref 44–121)
BUN/Creatinine Ratio: 20 (ref 9–23)
BUN: 17 mg/dL (ref 6–24)
Bilirubin Total: 0.3 mg/dL (ref 0.0–1.2)
CO2: 20 mmol/L (ref 20–29)
Calcium: 9.2 mg/dL (ref 8.7–10.2)
Chloride: 103 mmol/L (ref 96–106)
Creatinine, Ser: 0.87 mg/dL (ref 0.57–1.00)
Globulin, Total: 2.8 g/dL (ref 1.5–4.5)
Glucose: 91 mg/dL (ref 70–99)
Potassium: 4.4 mmol/L (ref 3.5–5.2)
Sodium: 139 mmol/L (ref 134–144)
Total Protein: 7 g/dL (ref 6.0–8.5)
eGFR: 84 mL/min/{1.73_m2} (ref >59)

## 2023-04-05 LAB — HEMOGLOBIN A1C
Est. average glucose Bld gHb Est-mCnc: 105 mg/dL
Hgb A1c MFr Bld: 5.3 % (ref 4.8–5.6)

## 2023-04-05 LAB — INSULIN, RANDOM: INSULIN: 10.6 u[IU]/mL (ref 2.6–24.9)

## 2023-04-16 NOTE — Progress Notes (Unsigned)
  Cardiology Office Note:  .   Date:  04/16/2023  ID:  Tamara Ball, DOB 01-Jun-1980, MRN [id number] PCP: Suzan Slick, MD  [facility] Providers Cardiologist:  None { Click to update primary MD,subspecialty MD or APP then REFRESH:1}   History of Present Illness: Tamara Ball is a 42 y.o. female with history of IDA who presents for the evaluation of palpitations. Seen in 2022 for symptoms of CP and palpitations after Covid.   TSH 2.17 HGB  13.9     ***{There is no content from the last Narrative History section.}    ROS: All other ROS reviewed and negative. Pertinent positives noted in the HPI.     Studies Reviewed: Marland Kitchen       Physical Exam:   VS:  LMP 03/28/2023 (Approximate)    Wt Readings from Last 3 Encounters:  04/04/23 192 lb (87.1 kg)  03/26/23 197 lb (89.4 kg)  03/08/23 188 lb (85.3 kg)    GEN: Well nourished, well developed in no acute distress NECK: No JVD; No carotid bruits CARDIAC: ***RRR, no murmurs, rubs, gallops RESPIRATORY:  Clear to auscultation without rales, wheezing or rhonchi  ABDOMEN: Soft, non-tender, non-distended EXTREMITIES:  No edema; No deformity  ASSESSMENT AND PLAN: .   ***    {Are you ordering a CV Procedure (e.g. stress test, cath, DCCV, TEE, etc)?   Press F2        :[id number]}   Follow-up: No follow-ups on file.  Time Spent with Patient: I have spent a total of *** minutes caring for this patient today face to face, ordering and reviewing labs/tests, reviewing prior records/medical history, examining the patient, establishing an assessment and plan, communicating results/findings to the patient/family, and documenting in the medical record.   Signed, Lenna Gilford. Flora Lipps, MD, [facility]  [facility]  [address], [address] [place], [place] [address] [phone]  8:13 PM

## 2023-04-17 ENCOUNTER — Ambulatory Visit: Payer: BC Managed Care – PPO | Attending: Cardiology | Admitting: Cardiovascular Disease

## 2023-04-17 ENCOUNTER — Ambulatory Visit (INDEPENDENT_AMBULATORY_CARE_PROVIDER_SITE_OTHER): Payer: BC Managed Care – PPO

## 2023-04-17 ENCOUNTER — Encounter: Payer: Self-pay | Admitting: Cardiovascular Disease

## 2023-04-17 VITALS — BP 118/72 | HR 74 | Ht 66.0 in | Wt 196.2 lb

## 2023-04-17 DIAGNOSIS — R079 Chest pain, unspecified: Secondary | ICD-10-CM | POA: Diagnosis not present

## 2023-04-17 DIAGNOSIS — R072 Precordial pain: Secondary | ICD-10-CM | POA: Diagnosis not present

## 2023-04-17 DIAGNOSIS — R002 Palpitations: Secondary | ICD-10-CM | POA: Diagnosis not present

## 2023-04-17 MED ORDER — METOPROLOL TARTRATE 50 MG PO TABS: ORAL_TABLET | ORAL | 0 refills | Status: DC

## 2023-04-17 NOTE — Progress Notes (Unsigned)
Enrolled for [employer] to mail a ZIO XT long term holter monitor to the patients address on file.  

## 2023-04-17 NOTE — Patient Instructions (Addendum)
Medication Instructions:  - Take METOPROLOL 50MG  2hrs before cardiac testing.    *If you need a refill on your cardiac medications before your next appointment, please call your pharmacy*   Lab Work: None    If you have labs (blood work) drawn today and your tests are completely normal, you will receive your results only by: MyChart Message (if you have MyChart) OR A paper copy in the mail If you have any lab test that is abnormal or we need to change your treatment, we will call you to review the results.   Testing/Procedures:   Your cardiac CT will be scheduled at one of the below locations:   [facility] [address] [place], [place] [address] [phone]  If scheduled at [facility], please arrive at the [facility] and Children's Entrance (Entrance C2) of [facility] 30 minutes prior to test start time. You can use the FREE valet parking offered at entrance C (encouraged to control the heart rate for the test)  Proceed to the [facility] Radiology Department (first floor) to check-in and test prep.  All radiology patients and guests should use entrance C2 at [facility], accessed from [facility], even though the hospital's physical address listed is [address].     There is spacious parking and easy access to the radiology department from the [facility] entrance. Please enter here and check-in with the desk attendant.   Please follow these instructions carefully (unless otherwise directed):  An IV will be required for this test and Nitroglycerin will be given.  Hold all erectile dysfunction medications at least 3 days (72 hrs) prior to test. (Ie viagra, cialis, sildenafil, tadalafil, etc)   On the Night Before the Test: Be sure to Drink plenty of water. Do not consume any caffeinated/decaffeinated beverages or chocolate 12 hours prior to your test. Do not take any antihistamines 12 hours prior  to your test.  On the Day of the Test: Drink plenty of water until 1 hour prior to the test. Do not eat any food 1 hour prior to test. You may take your regular medications prior to the test.  Take metoprolol (Lopressor)50mg  two hours prior to test. If you take Furosemide/Hydrochlorothiazide/Spironolactone/Chlorthalidone, please HOLD on the morning of the test. Patients who wear a continuous glucose monitor MUST remove the device prior to scanning.          After the Test: Drink plenty of water. After receiving IV contrast, you may experience a mild flushed feeling. This is normal. On occasion, you may experience a mild rash up to 24 hours after the test. This is not dangerous. If this occurs, you can take Benadryl 25 mg and increase your fluid intake. If you experience trouble breathing, this can be serious. If it is severe call 911 IMMEDIATELY. If it is mild, please call our office.  We will call to schedule your test 2-4 weeks out understanding that some insurance companies will need an authorization prior to the service being performed.   For more information and frequently asked questions, please visit our website : [email or web address]  For non-scheduling related questions, please contact the cardiac imaging nurse navigator should you have any questions/concerns: Cardiac Imaging Nurse Navigators Direct Office Dial: [phone]   For scheduling needs, including cancellations and rescheduling, please call [place], [phone].    Follow-Up: At [facility], you and your health needs are our priority.  As part of our continuing mission  to provide you with exceptional heart care, we have created designated Provider Care Teams.  These Care Teams include your primary Cardiologist (physician) and Advanced Practice Providers (APPs -  Physician Assistants and Nurse Practitioners) who all work together to provide you with the care you need, when you need it.  We  recommend signing up for the patient portal called "MyChart".  Sign up information is provided on this After Visit Summary.  MyChart is used to connect with patients for Virtual Visits (Telemedicine).  Patients are able to view lab/test results, encounter notes, upcoming appointments, etc.  Non-urgent messages can be sent to your provider as well.   To learn more about what you can do with MyChart, go to [email or web address].       ZIO XT- Long Term Monitor Instructions  Your physician has requested you wear a ZIO patch monitor for 14 days.  This is a single patch monitor. [employer] supplies one patch monitor per enrollment. Additional stickers are not available. Please do not apply patch if you will be having a Nuclear Stress Test,  Echocardiogram, Cardiac CT, MRI, or Chest Xray during the period you would be wearing the  monitor. The patch cannot be worn during these tests. You cannot remove and re-apply the  ZIO XT patch monitor.  Your ZIO patch monitor will be mailed 3 day USPS to your address on file. It may take 3-5 days  to receive your monitor after you have been enrolled.  Once you have received your monitor, please review the enclosed instructions. Your monitor  has already been registered assigning a specific monitor serial # to you.  Billing and Patient Assistance Program Information  We have supplied [employer] with any of your insurance information on file for billing purposes. [employer] offers a sliding scale Patient Assistance Program for patients that do not have  insurance, or whose insurance does not completely cover the cost of the ZIO monitor.  You must apply for the Patient Assistance Program to qualify for this discounted rate.  To apply, please call [employer] at [phone], select option 4, select option 2, ask to apply for  Patient Assistance Program. Meredeth Ide will ask your household income, and how many people  are in your household. They will quote your out-of-pocket  cost based on that information.  [employer] will also be able to set up a 6-month, interest-free payment plan if needed.  Applying the monitor   Shave hair from upper left chest.  Hold abrader disc by orange tab. Rub abrader in 40 strokes over the upper left chest as  indicated in your monitor instructions.  Clean area with 4 enclosed alcohol pads. Let dry.  Apply patch as indicated in monitor instructions. Patch will be placed under collarbone on left  side of chest with arrow pointing upward.  Rub patch adhesive wings for 2 minutes. Remove white label marked "1". Remove the white  label marked "2". Rub patch adhesive wings for 2 additional minutes.  While looking in a mirror, press and release button in center of patch. A small green light will  flash 3-4 times. This will be your only indicator that the monitor has been turned on.  Do not shower for the first 24 hours. You may shower after the first 24 hours.  Press the button if you feel a symptom. You will hear a small click. Record Date, Time and  Symptom in the Patient Logbook.  When you are ready to remove the patch, follow instructions on the last  2 pages of Patient  Logbook. Stick patch monitor onto the last page of Patient Logbook.  Place Patient Logbook in the blue and white box. Use locking tab on box and tape box closed  securely. The blue and white box has prepaid postage on it. Please place it in the mailbox as  soon as possible. Your physician should have your test results approximately 7 days after the  monitor has been mailed back to [facility].  Call [facility] Customer Care at [phone] if you have questions regarding  your ZIO XT patch monitor. Call them immediately if you see an orange light blinking on your  monitor.  If your monitor falls off in less than 4 days, contact our Monitor department at [phone].  If your monitor becomes loose or falls off after 4 days call [employer] at [phone] for   suggestions on securing your monitor   Your next appointment:   Follow up as  needed pending results of testing    Provider:   Lennie Odor, MD   Other Instructions

## 2023-04-19 ENCOUNTER — Other Ambulatory Visit: Payer: Self-pay | Admitting: Family Medicine

## 2023-04-19 DIAGNOSIS — J309 Allergic rhinitis, unspecified: Secondary | ICD-10-CM

## 2023-04-22 DIAGNOSIS — R002 Palpitations: Secondary | ICD-10-CM | POA: Diagnosis not present

## 2023-04-30 ENCOUNTER — Encounter (HOSPITAL_COMMUNITY): Payer: Self-pay

## 2023-04-30 ENCOUNTER — Telehealth: Payer: BC Managed Care – PPO | Admitting: Physician Assistant

## 2023-04-30 DIAGNOSIS — J069 Acute upper respiratory infection, unspecified: Secondary | ICD-10-CM | POA: Diagnosis not present

## 2023-04-30 MED ORDER — AZELASTINE HCL 0.1 % NA SOLN: 1.0000 | Freq: Two times a day (BID) | NASAL | 0 refills | Status: DC

## 2023-04-30 NOTE — Progress Notes (Signed)
I have spent 5 minutes in review of e-visit questionnaire, review and updating patient chart, medical decision making and response to patient.   Mia Milan Cody Jacklynn Dehaas, PA-C    

## 2023-04-30 NOTE — Progress Notes (Signed)

## 2023-05-01 ENCOUNTER — Telehealth (HOSPITAL_COMMUNITY): Payer: Self-pay | Admitting: *Deleted

## 2023-05-01 NOTE — Telephone Encounter (Signed)
Patient returning call about her upcoming cardiac imaging study; pt verbalizes understanding of appt date/time, parking situation and where to check in, pre-test NPO status and medications ordered, and verified current allergies; name and call back number provided for further questions should they arise  Larey Brick RN Navigator Cardiac Imaging [facility] [phone] office [phone] cell  Patient to take 50mg  metoprolol tartrate two hours prior to her cardiac CT scan. She is aware to arrive at 1:30 PM.

## 2023-05-01 NOTE — Telephone Encounter (Signed)
Attempted to call patient regarding upcoming cardiac CT appointment. °Left message on voicemail with name and callback number ° °Edie Vallandingham RN Navigator Cardiac Imaging °[facility] °[phone] Office °[phone] Cell ° °

## 2023-05-03 ENCOUNTER — Ambulatory Visit (HOSPITAL_COMMUNITY)
Admission: RE | Admit: 2023-05-03 | Discharge: 2023-05-03 | Disposition: A | Discharge status: Home / Self Care | Payer: BC Managed Care – PPO | Source: Ambulatory Visit | Attending: Cardiovascular Disease | Admitting: Cardiovascular Disease

## 2023-05-03 DIAGNOSIS — R079 Chest pain, unspecified: Secondary | ICD-10-CM | POA: Diagnosis present

## 2023-05-03 DIAGNOSIS — R072 Precordial pain: Secondary | ICD-10-CM | POA: Diagnosis not present

## 2023-05-03 MED ORDER — NITROGLYCERIN 0.4 MG SL SUBL
0.8000 mg | SUBLINGUAL_TABLET | Freq: Once | SUBLINGUAL | Status: AC
  Administered 2023-05-03: 0.8 mg via SUBLINGUAL

## 2023-05-03 MED ORDER — METOPROLOL TARTRATE 5 MG/5ML IV SOLN: 10.0000 mg | Freq: Once | INTRAVENOUS | Status: DC | PRN

## 2023-05-03 MED ORDER — IOHEXOL 350 MG/ML SOLN
95.0000 mL | Freq: Once | INTRAVENOUS | Status: AC | PRN
  Administered 2023-05-03: 95 mL via INTRAVENOUS

## 2023-05-03 MED ORDER — NITROGLYCERIN 0.4 MG SL SUBL
SUBLINGUAL_TABLET | SUBLINGUAL | Status: AC
  Filled 2023-05-03: qty 2

## 2023-05-03 MED ORDER — DILTIAZEM HCL 25 MG/5ML IV SOLN: 10.0000 mg | INTRAVENOUS | Status: DC | PRN

## 2023-05-07 ENCOUNTER — Encounter: Payer: Self-pay | Admitting: Hematology & Oncology

## 2023-05-07 ENCOUNTER — Encounter: Payer: Self-pay | Admitting: Nurse Practitioner

## 2023-05-07 ENCOUNTER — Telehealth: Payer: BC Managed Care – PPO

## 2023-05-07 ENCOUNTER — Ambulatory Visit: Payer: BC Managed Care – PPO | Admitting: Nurse Practitioner

## 2023-05-07 VITALS — BP 114/69 | HR 80 | Temp 97.7°F | Ht 66.0 in | Wt 186.0 lb

## 2023-05-07 DIAGNOSIS — B9689 Other specified bacterial agents as the cause of diseases classified elsewhere: Secondary | ICD-10-CM

## 2023-05-07 DIAGNOSIS — Z6830 Body mass index (BMI) 30.0-30.9, adult: Secondary | ICD-10-CM | POA: Diagnosis not present

## 2023-05-07 DIAGNOSIS — J019 Acute sinusitis, unspecified: Secondary | ICD-10-CM | POA: Diagnosis not present

## 2023-05-07 DIAGNOSIS — R632 Polyphagia: Secondary | ICD-10-CM

## 2023-05-07 DIAGNOSIS — E669 Obesity, unspecified: Secondary | ICD-10-CM

## 2023-05-07 MED ORDER — LIRAGLUTIDE 18 MG/3ML ~~LOC~~ SOPN: 1.2000 mg | PEN_INJECTOR | Freq: Every day | SUBCUTANEOUS | 1 refills | Status: DC

## 2023-05-07 MED ORDER — LIRAGLUTIDE 18 MG/3ML ~~LOC~~ SOPN: 1.2000 mg | PEN_INJECTOR | Freq: Every day | SUBCUTANEOUS | 0 refills | Status: DC

## 2023-05-07 NOTE — Progress Notes (Signed)
Office: [phone]  /  Fax: [phone]  WEIGHT SUMMARY AND BIOMETRICS  Weight Lost Since Last Visit: 6lb  Weight Gained Since Last Visit: 0lb   Vitals Temp: 97.7 F (36.5 C) BP: 114/69 Pulse Rate: 80 SpO2: 99 %   Anthropometric Measurements Height: 5\' 6"  (1.676 m) Weight: 186 lb (84.4 kg) BMI (Calculated): 30.04 Weight at Last Visit: 192lb Weight Lost Since Last Visit: 6lb Weight Gained Since Last Visit: 0lb Starting Weight: 205lb Total Weight Loss (lbs): 19 lb (8.618 kg)   Body Composition  Body Fat %: 35.8 % Fat Mass (lbs): 66.6 lbs Muscle Mass (lbs): 113.6 lbs Total Body Water (lbs): 75.8 lbs Visceral Fat Rating : 7   Other Clinical Data Fasting: No Labs: No Today's Visit #: 5 Starting Date: 06/13/18     HPI  Chief Complaint: OBESITY  Tamara Ball is here to discuss her progress with her obesity treatment plan. She is on the the Category 3 Plan and states she is following her eating plan approximately 70 % of the time. She states she is exercising 60 minutes 3-4 days per week-cardio and resistance training.   Interval History:  Since last office visit she has lost 6 pounds.    Pharmacotherapy for weight loss: She is currently taking Victoza 1.2 mg off label for medical weight loss.  Reports side effects of constipation.  Has been taking Victoza on a regular basis and finds that it has helped overall with polyphagia and cravings.  Would like to continue 1.2mg .     Previous pharmacotherapy for medical weight loss:   Tamara Ball, [place] and Rybels[facility].  Felt that Ozempic worked the best for her but is unable to take due to cost.     Bariatric surgery:  Patient has not had bariatric surgery      PHYSICAL EXAM:  Blood pressure 114/69, pulse 80, temperature 97.7 F (36.5 C), height 5\' 6"  (1.676 m), weight 186 lb (84.4 kg), SpO2 99%. Body mass index is 30.02 kg/m.  General: She is overweight, cooperative, alert, well developed, and in no acute  distress. PSYCH: Has normal mood, affect and thought process.   Extremities: No edema.  Neurologic: No gross sensory or motor deficits. No tremors or fasciculations noted.    DIAGNOSTIC DATA REVIEWED:  BMET    Component Value Date/Time   NA 139 04/04/2023 0903   K 4.4 04/04/2023 0903   K 4.2 08/11/2016 1404   CL 103 04/04/2023 0903   CL 103 08/11/2016 1404   CO2 20 04/04/2023 0903   CO2 26 08/11/2016 1404   GLUCOSE 91 04/04/2023 0903   GLUCOSE 94 03/26/2023 0753   BUN 17 04/04/2023 0903   CREATININE 0.87 04/04/2023 0903   CREATININE 1.02 (H) 03/26/2023 0753   CREATININE 0.87 08/11/2016 1404   CALCIUM 9.2 04/04/2023 0903   CALCIUM 9.5 08/11/2016 1404   GFRNONAA >60 03/26/2023 0753   GFRAA 83 01/27/2020 0844   Lab Results  Component Value Date   HGBA1C 5.3 04/04/2023   HGBA1C 5.0 04/07/2015   Lab Results  Component Value Date   INSULIN 10.6 04/04/2023   INSULIN 8.9 06/13/2018   Lab Results  Component Value Date   TSH 2.170 07/19/2022   CBC    Component Value Date/Time   WBC 6.2 03/26/2023 0753   WBC 9.4 06/20/2020 0405   RBC 5.00 03/26/2023 0753   RBC 5.01 03/26/2023 0753   HGB 13.9 03/26/2023 0753   HGB 11.4 01/05/2022 1021   HGB 14.2 04/20/2017 1524  HCT 43.0 03/26/2023 0753   HCT 36.3 01/05/2022 1021   HCT 43.2 04/20/2017 1524   PLT 199 03/26/2023 0753   PLT 258 01/05/2022 1021   MCV 86.0 03/26/2023 0753   MCV 80 01/05/2022 1021   MCV 90 04/20/2017 1524   MCH 27.8 03/26/2023 0753   MCHC 32.3 03/26/2023 0753   RDW 21.1 (H) 03/26/2023 0753   RDW 13.7 01/05/2022 1021   RDW 13.5 04/20/2017 1524   Iron Studies    Component Value Date/Time   IRON 49 03/26/2023 0753   IRON 37 01/05/2022 1021   IRON 103 04/20/2017 1524   TIBC 504 (H) 03/26/2023 0753   TIBC 450 (H) 04/20/2017 1524   FERRITIN 12 03/26/2023 0753   FERRITIN 9 (L) 01/05/2022 1021   FERRITIN 23 04/20/2017 1524   IRONPCTSAT 10 (L) 03/26/2023 0753   IRONPCTSAT 23 04/20/2017 1524    IRONPCTSAT 18 (L) 10/30/2014 1213   Lipid Panel     Component Value Date/Time   CHOL 126 01/05/2022 1021   TRIG 31 01/05/2022 1021   HDL 62 01/05/2022 1021   CHOLHDL 2.2 07/13/2020 1421   CHOLHDL 2 05/27/2018 0848   VLDL 10.4 05/27/2018 0848   LDLCALC 55 01/05/2022 1021   Hepatic Function Panel     Component Value Date/Time   PROT 7.0 04/04/2023 0903   ALBUMIN 4.2 04/04/2023 0903   ALBUMIN 4.2 08/11/2016 1404   AST 16 04/04/2023 0903   AST 20 03/26/2023 0753   ALT 12 04/04/2023 0903   ALT 11 03/26/2023 0753   ALKPHOS 67 04/04/2023 0903   ALKPHOS 58 08/11/2016 1404   BILITOT 0.3 04/04/2023 0903   BILITOT 0.5 03/26/2023 0753   BILIDIR 0.0 06/24/2012 1055      Component Value Date/Time   TSH 2.170 07/19/2022 0847   Nutritional Lab Results  Component Value Date   VD25OH 51.3 07/19/2022   VD25OH 37.3 01/05/2022   VD25OH 45.01 02/24/2021     ASSESSMENT AND PLAN  TREATMENT PLAN FOR OBESITY:  Recommended Dietary Goals  Tamara Ball is currently in the action stage of change. As such, her goal is to continue weight management plan. She has agreed to the Category 3 Plan.  Behavioral Intervention  We discussed the following Behavioral Modification Strategies today: increasing lean protein intake to established goals, decreasing simple carbohydrates , increasing vegetables, increasing water intake , reading food labels , keeping healthy foods at home, and continue to work on maintaining a reduced calorie state, getting the recommended amount of protein, incorporating whole foods, making healthy choices, staying well hydrated and practicing mindfulness when eating..  Additional resources provided today: NA  Recommended Physical Activity Goals  Tamara Ball has been advised to work up to 150 minutes of moderate intensity aerobic activity a week and strengthening exercises 2-3 times per week for cardiovascular health, weight loss maintenance and preservation of muscle mass.   She  has agreed to Continue current level of physical activity    Pharmacotherapy We discussed various medication options to help Tamara Ball with her weight loss efforts and we both agreed to continue Victoza 1.2 mg off label for medical weight loss.  Side effects discussed.    ASSOCIATED CONDITIONS ADDRESSED TODAY  Action/Plan  Polyphagia -     Liraglutide; Inject 1.2 mg into the skin daily.  Dispense: 6 mL; Refill: 0  Generalized obesity -     Liraglutide; Inject 1.2 mg into the skin daily.  Dispense: 6 mL; Refill: 0  BMI 30.0-30.9,adult  Labs reviewed in chart with patient from 04/04/23   Return in about 4 weeks (around 06/04/2023).Marland Kitchen She was informed of the importance of frequent follow up visits to maximize her success with intensive lifestyle modifications for her multiple health conditions.   ATTESTASTION STATEMENTS:  Reviewed by clinician on day of visit: allergies, medications, problem list, medical history, surgical history, family history, social history, and previous encounter notes.      Theodis Sato. Deadrian Toya FNP-C

## 2023-05-08 MED ORDER — AMOXICILLIN-POT CLAVULANATE 875-125 MG PO TABS: 1.0000 | ORAL_TABLET | Freq: Two times a day (BID) | ORAL | 0 refills | Status: DC

## 2023-05-08 NOTE — Progress Notes (Signed)

## 2023-05-20 ENCOUNTER — Encounter: Payer: Self-pay | Admitting: Cardiovascular Disease

## 2023-05-20 ENCOUNTER — Other Ambulatory Visit: Payer: Self-pay | Admitting: Cardiovascular Disease

## 2023-05-20 DIAGNOSIS — R9431 Abnormal electrocardiogram [ECG] [EKG]: Secondary | ICD-10-CM

## 2023-05-20 MED ORDER — METOPROLOL SUCCINATE ER 25 MG PO TB24: 25.0000 mg | ORAL_TABLET | Freq: Every day | ORAL | 3 refills | Status: DC

## 2023-05-20 NOTE — Progress Notes (Signed)
 Metoprolol succinate 25 mg daily ordered for PACs.  I also ordered an echocardiogram.  Gerri Spore T. Flora Lipps, MD, [facility]  [facility]  [address] [place], [place] [address] [phone]  7:07 PM

## 2023-05-21 ENCOUNTER — Encounter: Payer: Self-pay | Admitting: Hematology & Oncology

## 2023-05-22 ENCOUNTER — Ambulatory Visit (HOSPITAL_COMMUNITY)
Admission: RE | Admit: 2023-05-22 | Discharge: 2023-05-22 | Disposition: A | Discharge status: Home / Self Care | Payer: 59 | Source: Ambulatory Visit | Attending: Cardiovascular Disease | Admitting: Cardiovascular Disease

## 2023-05-22 DIAGNOSIS — R9431 Abnormal electrocardiogram [ECG] [EKG]: Secondary | ICD-10-CM

## 2023-05-22 LAB — ECHOCARDIOGRAM COMPLETE
AR max vel: 3.34 cm2
AV Area VTI: 2.18 cm2
AV Area mean vel: 1.96 cm2
AV Mean grad: 5 mm[Hg]
AV Peak grad: 3.3 mm[Hg]
Ao pk vel: 0.91 m/s
Area-P 1/2: 3.79 cm2
S' Lateral: 3.75 cm

## 2023-06-20 ENCOUNTER — Other Ambulatory Visit: Payer: Self-pay | Admitting: Family

## 2023-06-21 ENCOUNTER — Encounter: Payer: Self-pay | Admitting: Nurse Practitioner

## 2023-06-21 ENCOUNTER — Ambulatory Visit: Payer: 59 | Admitting: Nurse Practitioner

## 2023-06-21 VITALS — BP 114/74 | HR 60 | Temp 97.9°F | Ht 66.0 in | Wt 186.0 lb

## 2023-06-21 DIAGNOSIS — E669 Obesity, unspecified: Secondary | ICD-10-CM

## 2023-06-21 DIAGNOSIS — Z6830 Body mass index (BMI) 30.0-30.9, adult: Secondary | ICD-10-CM | POA: Diagnosis not present

## 2023-06-21 DIAGNOSIS — E66811 Obesity, class 1: Secondary | ICD-10-CM

## 2023-06-21 DIAGNOSIS — R632 Polyphagia: Secondary | ICD-10-CM | POA: Diagnosis not present

## 2023-06-21 MED ORDER — LIRAGLUTIDE 18 MG/3ML ~~LOC~~ SOPN: 1.2000 mg | PEN_INJECTOR | Freq: Every day | SUBCUTANEOUS | 1 refills | Status: DC

## 2023-06-21 MED ORDER — LIRAGLUTIDE 18 MG/3ML ~~LOC~~ SOPN: 1.8000 mg | PEN_INJECTOR | Freq: Every day | SUBCUTANEOUS | 1 refills | Status: DC

## 2023-06-21 NOTE — Progress Notes (Signed)
Office: [phone]  /  Fax: [phone]  WEIGHT SUMMARY AND BIOMETRICS  Weight Lost Since Last Visit: 0lb  Weight Gained Since Last Visit: 0lb   Vitals Temp: 97.9 F (36.6 C) BP: 114/74 Pulse Rate: 60 SpO2: 100 %   Anthropometric Measurements Height: 5\' 6"  (1.676 m) Weight: 186 lb (84.4 kg) BMI (Calculated): 30.04 Weight at Last Visit: 186lb Weight Lost Since Last Visit: 0lb Weight Gained Since Last Visit: 0lb Starting Weight: 205lb Total Weight Loss (lbs): 19 lb (8.618 kg)   Body Composition  Body Fat %: 36.1 % Fat Mass (lbs): 67.2 lbs Muscle Mass (lbs): 113.2 lbs Total Body Water (lbs): 75.8 lbs Visceral Fat Rating : 7   Other Clinical Data Fasting: Yes Labs: No Today's Visit #: 50 Starting Date: 06/13/18     HPI  Chief Complaint: OBESITY  Tamara Ball is here to discuss her progress with her obesity treatment plan. She is on the the Category 3 Plan and states she is following her eating plan approximately 50 % of the time. She states she is exercising 45-60 minutes 3 days per week.   Interval History:  Since last office visit she has maintained her weight.    She is going to [place] for 10 days prior to her next visit.   Pharmacotherapy for weight loss: She is currently taking Victoza 1.2 mg off label for medical weight loss.  Reports side effects of constipation.  She has been traveling and forgot to take the Victoza with her and missed several days.    Previous pharmacotherapy for medical weight loss:   Tamara Ball, Tamara Ball and [facility].  Felt that Ozempic worked the best for her but is unable to take due to cost.     Bariatric surgery:  Patient has not had bariatric surgery      PHYSICAL EXAM:  Blood pressure 114/74, pulse 60, temperature 97.9 F (36.6 C), height 5\' 6"  (1.676 m), weight 186 lb (84.4 kg), last menstrual period 06/12/2023, SpO2 100%. Body mass index is 30.02 kg/m.  General: She is overweight, cooperative, alert, well  developed, and in no acute distress. PSYCH: Has normal mood, affect and thought process.   Extremities: No edema.  Neurologic: No gross sensory or motor deficits. No tremors or fasciculations noted.    DIAGNOSTIC DATA REVIEWED:  BMET    Component Value Date/Time   NA 139 04/04/2023 0903   K 4.4 04/04/2023 0903   K 4.2 08/11/2016 1404   CL 103 04/04/2023 0903   CL 103 08/11/2016 1404   CO2 20 04/04/2023 0903   CO2 26 08/11/2016 1404   GLUCOSE 91 04/04/2023 0903   GLUCOSE 94 03/26/2023 0753   BUN 17 04/04/2023 0903   CREATININE 0.87 04/04/2023 0903   CREATININE 1.02 (H) 03/26/2023 0753   CREATININE 0.87 08/11/2016 1404   CALCIUM 9.2 04/04/2023 0903   CALCIUM 9.5 08/11/2016 1404   GFRNONAA >60 03/26/2023 0753   GFRAA 83 01/27/2020 0844   Lab Results  Component Value Date   HGBA1C 5.3 04/04/2023   HGBA1C 5.0 04/07/2015   Lab Results  Component Value Date   INSULIN 10.6 04/04/2023   INSULIN 8.9 06/13/2018   Lab Results  Component Value Date   TSH 2.170 07/19/2022   CBC    Component Value Date/Time   WBC 6.2 03/26/2023 0753   WBC 9.4 06/20/2020 0405   RBC 5.00 03/26/2023 0753   RBC 5.01 03/26/2023 0753   HGB 13.9 03/26/2023 0753   HGB 11.4 01/05/2022 1021  HGB 14.2 04/20/2017 1524   HCT 43.0 03/26/2023 0753   HCT 36.3 01/05/2022 1021   HCT 43.2 04/20/2017 1524   PLT 199 03/26/2023 0753   PLT 258 01/05/2022 1021   MCV 86.0 03/26/2023 0753   MCV 80 01/05/2022 1021   MCV 90 04/20/2017 1524   MCH 27.8 03/26/2023 0753   MCHC 32.3 03/26/2023 0753   RDW 21.1 (H) 03/26/2023 0753   RDW 13.7 01/05/2022 1021   RDW 13.5 04/20/2017 1524   Iron Studies    Component Value Date/Time   IRON 49 03/26/2023 0753   IRON 37 01/05/2022 1021   IRON 103 04/20/2017 1524   TIBC 504 (H) 03/26/2023 0753   TIBC 450 (H) 04/20/2017 1524   FERRITIN 12 03/26/2023 0753   FERRITIN 9 (L) 01/05/2022 1021   FERRITIN 23 04/20/2017 1524   IRONPCTSAT 10 (L) 03/26/2023 0753   IRONPCTSAT  23 04/20/2017 1524   IRONPCTSAT 18 (L) 10/30/2014 1213   Lipid Panel     Component Value Date/Time   CHOL 126 01/05/2022 1021   TRIG 31 01/05/2022 1021   HDL 62 01/05/2022 1021   CHOLHDL 2.2 07/13/2020 1421   CHOLHDL 2 05/27/2018 0848   VLDL 10.4 05/27/2018 0848   LDLCALC 55 01/05/2022 1021   Hepatic Function Panel     Component Value Date/Time   PROT 7.0 04/04/2023 0903   ALBUMIN 4.2 04/04/2023 0903   ALBUMIN 4.2 08/11/2016 1404   AST 16 04/04/2023 0903   AST 20 03/26/2023 0753   ALT 12 04/04/2023 0903   ALT 11 03/26/2023 0753   ALKPHOS 67 04/04/2023 0903   ALKPHOS 58 08/11/2016 1404   BILITOT 0.3 04/04/2023 0903   BILITOT 0.5 03/26/2023 0753   BILIDIR 0.0 06/24/2012 1055      Component Value Date/Time   TSH 2.170 07/19/2022 0847   Nutritional Lab Results  Component Value Date   VD25OH 51.3 07/19/2022   VD25OH 37.3 01/05/2022   VD25OH 45.01 02/24/2021     ASSESSMENT AND PLAN  TREATMENT PLAN FOR OBESITY:  Recommended Dietary Goals  Tamara Ball is currently in the action stage of change. As such, her goal is to continue weight management plan. She has agreed to the Category 3 Plan.  Behavioral Intervention  We discussed the following Behavioral Modification Strategies today: increasing lean protein intake to established goals, decreasing simple carbohydrates , increasing vegetables, increasing water intake , work on meal planning and preparation, keeping healthy foods at home, continue to work on implementation of reduced calorie nutritional plan, continue to practice mindfulness when eating, planning for success, and continue to work on maintaining a reduced calorie state, getting the recommended amount of protein, incorporating whole foods, making healthy choices, staying well hydrated and practicing mindfulness when eating..  Additional resources provided today: NA  Recommended Physical Activity Goals  Tamara Ball has been advised to work up to 150 minutes of  moderate intensity aerobic activity a week and strengthening exercises 2-3 times per week for cardiovascular health, weight loss maintenance and preservation of muscle mass.   She has agreed to Continue current level of physical activity , Think about enjoyable ways to increase daily physical activity and overcoming barriers to exercise, and Increase physical activity in their day and reduce sedentary time (increase NEAT).   Pharmacotherapy We discussed various medication options to help [facility] with her weight loss efforts and we both agreed to continue Victoza 1.2mg  off label for weight loss.  Side effects discussed.  ASSOCIATED CONDITIONS ADDRESSED TODAY  Action/Plan  Polyphagia -     Liraglutide; Inject 1.8 mg into the skin daily.  Dispense: 9 mL; Refill: 1  Class 1 obesity due to excess calories without serious comorbidity with body mass index (BMI) of 30.0 to 30.9 in adult  Generalized obesity -     Liraglutide; Inject 1.8 mg into the skin daily.  Dispense: 9 mL; Refill: 1         Return in about 4 weeks (around 07/19/2023).Marland Kitchen She was informed of the importance of frequent follow up visits to maximize her success with intensive lifestyle modifications for her multiple health conditions.   ATTESTASTION STATEMENTS:  Reviewed by clinician on day of visit: allergies, medications, problem list, medical history, surgical history, family history, social history, and previous encounter notes.     Theodis Sato. Jontavious Commons FNP-C

## 2023-06-26 ENCOUNTER — Inpatient Hospital Stay: Payer: 59 | Attending: Family

## 2023-06-26 ENCOUNTER — Inpatient Hospital Stay: Payer: 59 | Admitting: Family

## 2023-07-01 ENCOUNTER — Encounter: Payer: Self-pay | Admitting: Family Medicine

## 2023-07-01 DIAGNOSIS — J309 Allergic rhinitis, unspecified: Secondary | ICD-10-CM

## 2023-07-02 MED ORDER — LEVOCETIRIZINE DIHYDROCHLORIDE 5 MG PO TABS: 5.0000 mg | ORAL_TABLET | Freq: Every evening | ORAL | 0 refills | Status: DC

## 2023-07-02 MED ORDER — MONTELUKAST SODIUM 10 MG PO TABS: 10.0000 mg | ORAL_TABLET | Freq: Every day | ORAL | 0 refills | Status: DC

## 2023-07-11 MED ORDER — MONTELUKAST SODIUM 10 MG PO TABS: 10.0000 mg | ORAL_TABLET | Freq: Every day | ORAL | 0 refills | Status: DC

## 2023-07-11 MED ORDER — LEVOCETIRIZINE DIHYDROCHLORIDE 5 MG PO TABS: 5.0000 mg | ORAL_TABLET | Freq: Every evening | ORAL | 0 refills | Status: DC

## 2023-07-11 NOTE — Addendum Note (Signed)
 Addended by: Suzan Slick on: 07/11/2023 01:13 PM   Modules accepted: Orders

## 2023-07-25 ENCOUNTER — Encounter: Payer: Self-pay | Admitting: Nurse Practitioner

## 2023-07-26 ENCOUNTER — Other Ambulatory Visit: Payer: Self-pay | Admitting: Nurse Practitioner

## 2023-07-26 DIAGNOSIS — E669 Obesity, unspecified: Secondary | ICD-10-CM

## 2023-07-26 MED ORDER — INSULIN SYRINGE-NEEDLE U-100 31G X 5/16" 0.3 ML MISC
0 refills | Status: DC
Start: 2023-07-26 — End: 2023-08-09

## 2023-08-09 ENCOUNTER — Encounter: Payer: Self-pay | Admitting: Nurse Practitioner

## 2023-08-09 ENCOUNTER — Ambulatory Visit: Payer: 59 | Admitting: Nurse Practitioner

## 2023-08-09 VITALS — BP 109/70 | HR 78 | Temp 98.2°F | Ht 66.0 in | Wt 188.0 lb

## 2023-08-09 DIAGNOSIS — E88819 Insulin resistance, unspecified: Secondary | ICD-10-CM

## 2023-08-09 DIAGNOSIS — D500 Iron deficiency anemia secondary to blood loss (chronic): Secondary | ICD-10-CM | POA: Diagnosis not present

## 2023-08-09 DIAGNOSIS — Z13220 Encounter for screening for lipoid disorders: Secondary | ICD-10-CM | POA: Diagnosis not present

## 2023-08-09 DIAGNOSIS — Z6830 Body mass index (BMI) 30.0-30.9, adult: Secondary | ICD-10-CM

## 2023-08-09 DIAGNOSIS — Z79899 Other long term (current) drug therapy: Secondary | ICD-10-CM

## 2023-08-09 DIAGNOSIS — R632 Polyphagia: Secondary | ICD-10-CM

## 2023-08-09 DIAGNOSIS — E66811 Obesity, class 1: Secondary | ICD-10-CM

## 2023-08-09 DIAGNOSIS — E6609 Other obesity due to excess calories: Secondary | ICD-10-CM

## 2023-08-09 MED ORDER — INSULIN PEN NEEDLE 31G X 5 MM MISC: 6 refills | Status: AC

## 2023-08-09 NOTE — Progress Notes (Signed)
 Office: [phone]  /  Fax: [phone]  WEIGHT SUMMARY AND BIOMETRICS  Weight Lost Since Last Visit: 0lb  Weight Gained Since Last Visit: 2lb   Vitals Temp: 98.2 F (36.8 C) BP: 109/70 Pulse Rate: 78 SpO2: 100 %   Anthropometric Measurements Height: 5\' 6"  (1.676 m) Weight: 188 lb (85.3 kg) BMI (Calculated): 30.36 Weight at Last Visit: 186lb Weight Lost Since Last Visit: 0lb Weight Gained Since Last Visit: 2lb Starting Weight: 205lb Total Weight Loss (lbs): 17 lb (7.711 kg)   Body Composition  Body Fat %: 35.9 % Fat Mass (lbs): 67.6 lbs Muscle Mass (lbs): 114.6 lbs Total Body Water (lbs): 77 lbs Visceral Fat Rating : 8   Other Clinical Data Fasting: Yes Labs: No Today's Visit #: 51 Starting Date: 06/13/18     HPI  Chief Complaint: OBESITY  Tamara Ball is here to discuss her progress with her obesity treatment plan. She is on the the Category 3 Plan and states she is following her eating plan approximately 30 % of the time. She states she is exercising 0 minutes 0 days per week.   Interval History:  Since last office visit she has gained 2 pounds.  She has traveled multiple times for work since her last visit.  She is up in water weight, down in body fat % and up in muscle mass.    Pharmacotherapy for weight loss: She is currently taking Victoza 1.2 mg off label for medical weight loss.  Reports side effects of constipation.  She has been traveling and forgot to take the Victoza with her and missed several days.    Previous pharmacotherapy for medical weight loss:   Tamara Ball, [place] and [employer].  Felt that Ozempic worked the best for her but is unable to take due to cost.     Bariatric surgery:  Patient has not had bariatric surgery   PHYSICAL EXAM:  Blood pressure 109/70, pulse 78, temperature 98.2 F (36.8 C), height 5\' 6"  (1.676 m), weight 188 lb (85.3 kg), last menstrual period 08/08/2023, SpO2 100%. Body mass index is 30.34  kg/m.  General: She is overweight, cooperative, alert, well developed, and in no acute distress. PSYCH: Has normal mood, affect and thought process.   Extremities: No edema.  Neurologic: No gross sensory or motor deficits. No tremors or fasciculations noted.    DIAGNOSTIC DATA REVIEWED:  BMET    Component Value Date/Time   NA 139 04/04/2023 0903   K 4.4 04/04/2023 0903   K 4.2 08/11/2016 1404   CL 103 04/04/2023 0903   CL 103 08/11/2016 1404   CO2 20 04/04/2023 0903   CO2 26 08/11/2016 1404   GLUCOSE 91 04/04/2023 0903   GLUCOSE 94 03/26/2023 0753   BUN 17 04/04/2023 0903   CREATININE 0.87 04/04/2023 0903   CREATININE 1.02 (H) 03/26/2023 0753   CREATININE 0.87 08/11/2016 1404   CALCIUM 9.2 04/04/2023 0903   CALCIUM 9.5 08/11/2016 1404   GFRNONAA >60 03/26/2023 0753   GFRAA 83 01/27/2020 0844   Lab Results  Component Value Date   HGBA1C 5.3 04/04/2023   HGBA1C 5.0 04/07/2015   Lab Results  Component Value Date   INSULIN 10.6 04/04/2023   INSULIN 8.9 06/13/2018   Lab Results  Component Value Date   TSH 2.170 07/19/2022   CBC    Component Value Date/Time   WBC 6.2 03/26/2023 0753   WBC 9.4 06/20/2020 0405   RBC 5.00 03/26/2023 0753   RBC 5.01 03/26/2023 0753  HGB 13.9 03/26/2023 0753   HGB 11.4 01/05/2022 1021   HGB 14.2 04/20/2017 1524   HCT 43.0 03/26/2023 0753   HCT 36.3 01/05/2022 1021   HCT 43.2 04/20/2017 1524   PLT 199 03/26/2023 0753   PLT 258 01/05/2022 1021   MCV 86.0 03/26/2023 0753   MCV 80 01/05/2022 1021   MCV 90 04/20/2017 1524   MCH 27.8 03/26/2023 0753   MCHC 32.3 03/26/2023 0753   RDW 21.1 (H) 03/26/2023 0753   RDW 13.7 01/05/2022 1021   RDW 13.5 04/20/2017 1524   Iron Studies    Component Value Date/Time   IRON 49 03/26/2023 0753   IRON 37 01/05/2022 1021   IRON 103 04/20/2017 1524   TIBC 504 (H) 03/26/2023 0753   TIBC 450 (H) 04/20/2017 1524   FERRITIN 12 03/26/2023 0753   FERRITIN 9 (L) 01/05/2022 1021   FERRITIN 23  04/20/2017 1524   IRONPCTSAT 10 (L) 03/26/2023 0753   IRONPCTSAT 23 04/20/2017 1524   IRONPCTSAT 18 (L) 10/30/2014 1213   Lipid Panel     Component Value Date/Time   CHOL 126 01/05/2022 1021   TRIG 31 01/05/2022 1021   HDL 62 01/05/2022 1021   CHOLHDL 2.2 07/13/2020 1421   CHOLHDL 2 05/27/2018 0848   VLDL 10.4 05/27/2018 0848   LDLCALC 55 01/05/2022 1021   Hepatic Function Panel     Component Value Date/Time   PROT 7.0 04/04/2023 0903   ALBUMIN 4.2 04/04/2023 0903   ALBUMIN 4.2 08/11/2016 1404   AST 16 04/04/2023 0903   AST 20 03/26/2023 0753   ALT 12 04/04/2023 0903   ALT 11 03/26/2023 0753   ALKPHOS 67 04/04/2023 0903   ALKPHOS 58 08/11/2016 1404   BILITOT 0.3 04/04/2023 0903   BILITOT 0.5 03/26/2023 0753   BILIDIR 0.0 06/24/2012 1055      Component Value Date/Time   TSH 2.170 07/19/2022 0847   Nutritional Lab Results  Component Value Date   VD25OH 51.3 07/19/2022   VD25OH 37.3 01/05/2022   VD25OH 45.01 02/24/2021     ASSESSMENT AND PLAN  TREATMENT PLAN FOR OBESITY:  Recommended Dietary Goals  Tamara Ball is currently in the action stage of change. As such, her goal is to continue weight management plan. She has agreed to the Category 3 Plan.  Behavioral Intervention  We discussed the following Behavioral Modification Strategies today: increasing lean protein intake to established goals, decreasing simple carbohydrates , increasing vegetables, increasing fiber rich foods, increasing water intake , planning for success, better snacking choices, and continue to work on maintaining a reduced calorie state, getting the recommended amount of protein, incorporating whole foods, making healthy choices, staying well hydrated and practicing mindfulness when eating..  Additional resources provided today: NA  Recommended Physical Activity Goals  Tamara Ball has been advised to work up to 150 minutes of moderate intensity aerobic activity a week and strengthening exercises  2-3 times per week for cardiovascular health, weight loss maintenance and preservation of muscle mass.   She has agreed to Think about enjoyable ways to increase daily physical activity and overcoming barriers to exercise, Increase physical activity in their day and reduce sedentary time (increase NEAT)., and Work on scheduling and tracking physical activity.    Pharmacotherapy We discussed various medication options to help [facility] with her weight loss efforts and we both agreed to continue Victoza 1.2mg . Side effects discussed.  ASSOCIATED CONDITIONS ADDRESSED TODAY  Action/Plan  Polyphagia Continue Victoza as directed.    Iron deficiency anemia  due to chronic blood loss -     Comprehensive metabolic panel with GFR -     CBC with Differential/Platelet -     Ferritin -     Iron  Insulin resistance -     Hemoglobin A1c -     Insulin, random  Screening for hyperlipidemia -     Lipid Panel With LDL/HDL Ratio  Medication management -     Comprehensive metabolic panel with GFR -     TSH  Class 1 obesity due to excess calories without serious comorbidity with body mass index (BMI) of 30.0 to 30.9 in adult -     TSH -     Insulin Pen Needle; Use daily with Victoza  Dispense: 50 each; Refill: 6         Return in about 6 weeks (around 09/20/2023).Marland Kitchen She was informed of the importance of frequent follow up visits to maximize her success with intensive lifestyle modifications for her multiple health conditions.   ATTESTASTION STATEMENTS:  Reviewed by clinician on day of visit: allergies, medications, problem list, medical history, surgical history, family history, social history, and previous encounter notes.     Theodis Sato. Tamara Kimple FNP-C

## 2023-08-10 LAB — LIPID PANEL WITH LDL/HDL RATIO
Cholesterol, Total: 126 mg/dL (ref 100–199)
HDL: 59 mg/dL (ref >39)
LDL Chol Calc (NIH): 59 mg/dL (ref 0–99)
LDL/HDL Ratio: 1 ratio (ref 0.0–3.2)
Triglycerides: 26 mg/dL (ref 0–149)
VLDL Cholesterol Cal: 8 mg/dL (ref 5–40)

## 2023-08-10 LAB — TSH: TSH: 1.94 u[IU]/mL (ref 0.450–4.500)

## 2023-08-10 LAB — CBC WITH DIFFERENTIAL/PLATELET
Basophils Absolute: 0.1 10*3/uL (ref 0.0–0.2)
Basos: 1 %
EOS (ABSOLUTE): 0.3 10*3/uL (ref 0.0–0.4)
Eos: 5 %
Hematocrit: 37 % (ref 34.0–46.6)
Hemoglobin: 11.4 g/dL (ref 11.1–15.9)
Immature Grans (Abs): 0 10*3/uL (ref 0.0–0.1)
Immature Granulocytes: 0 %
Lymphocytes Absolute: 1.1 10*3/uL (ref 0.7–3.1)
Lymphs: 18 %
MCH: 25.8 pg — ABNORMAL LOW (ref 26.6–33.0)
MCHC: 30.8 g/dL — ABNORMAL LOW (ref 31.5–35.7)
MCV: 84 fL (ref 79–97)
Monocytes Absolute: 0.4 10*3/uL (ref 0.1–0.9)
Monocytes: 6 %
Neutrophils Absolute: 4.5 10*3/uL (ref 1.4–7.0)
Neutrophils: 70 %
Platelets: 261 10*3/uL (ref 150–450)
RBC: 4.42 x10E6/uL (ref 3.77–5.28)
RDW: 13.3 % (ref 11.7–15.4)
WBC: 6.4 10*3/uL (ref 3.4–10.8)

## 2023-08-10 LAB — COMPREHENSIVE METABOLIC PANEL WITH GFR
ALT: 9 IU/L (ref 0–32)
AST: 15 IU/L (ref 0–40)
Albumin: 4.1 g/dL (ref 3.9–4.9)
Alkaline Phosphatase: 75 IU/L (ref 44–121)
BUN/Creatinine Ratio: 11 (ref 9–23)
BUN: 10 mg/dL (ref 6–24)
Bilirubin Total: 0.3 mg/dL (ref 0.0–1.2)
CO2: 23 mmol/L (ref 20–29)
Calcium: 9.6 mg/dL (ref 8.7–10.2)
Chloride: 105 mmol/L (ref 96–106)
Creatinine, Ser: 0.9 mg/dL (ref 0.57–1.00)
Globulin, Total: 2.8 g/dL (ref 1.5–4.5)
Glucose: 81 mg/dL (ref 70–99)
Potassium: 4.5 mmol/L (ref 3.5–5.2)
Sodium: 141 mmol/L (ref 134–144)
Total Protein: 6.9 g/dL (ref 6.0–8.5)
eGFR: 80 mL/min/{1.73_m2} (ref >59)

## 2023-08-10 LAB — INSULIN, RANDOM: INSULIN: 21.6 u[IU]/mL (ref 2.6–24.9)

## 2023-08-10 LAB — FERRITIN: Ferritin: 8 ng/mL — ABNORMAL LOW (ref 15–150)

## 2023-08-10 LAB — HEMOGLOBIN A1C
Est. average glucose Bld gHb Est-mCnc: 105 mg/dL
Hgb A1c MFr Bld: 5.3 % (ref 4.8–5.6)

## 2023-08-10 LAB — IRON: Iron: 24 ug/dL — ABNORMAL LOW (ref 27–159)

## 2023-08-12 ENCOUNTER — Encounter: Payer: Self-pay | Admitting: Hematology & Oncology

## 2023-08-14 ENCOUNTER — Telehealth: Payer: Self-pay | Admitting: Hematology & Oncology

## 2023-08-14 NOTE — Telephone Encounter (Signed)
 Called to schedule IV Iron. LVM to return call for scheduling.

## 2023-08-20 ENCOUNTER — Encounter: Payer: Self-pay | Admitting: Nurse Practitioner

## 2023-08-21 ENCOUNTER — Other Ambulatory Visit: Payer: Self-pay | Admitting: Nurse Practitioner

## 2023-08-21 DIAGNOSIS — E669 Obesity, unspecified: Secondary | ICD-10-CM

## 2023-08-21 MED ORDER — SAXENDA 18 MG/3ML ~~LOC~~ SOPN: 3.0000 mg | PEN_INJECTOR | Freq: Every day | SUBCUTANEOUS | 0 refills | Status: DC

## 2023-08-23 ENCOUNTER — Inpatient Hospital Stay: Attending: Family

## 2023-08-23 VITALS — BP 121/68 | HR 78 | Temp 97.6°F | Resp 17

## 2023-08-23 DIAGNOSIS — N920 Excessive and frequent menstruation with regular cycle: Secondary | ICD-10-CM | POA: Diagnosis present

## 2023-08-23 DIAGNOSIS — D500 Iron deficiency anemia secondary to blood loss (chronic): Secondary | ICD-10-CM | POA: Insufficient documentation

## 2023-08-23 MED ORDER — SODIUM CHLORIDE 0.9 % IV SOLN
INTRAVENOUS | Status: DC
Start: 2023-08-23 — End: 2023-08-23

## 2023-08-23 MED ORDER — IRON SUCROSE 20 MG/ML IV SOLN
300.0000 mg | Freq: Once | INTRAVENOUS | Status: AC
  Administered 2023-08-23: 300 mg via INTRAVENOUS
  Filled 2023-08-23: qty 300

## 2023-08-23 NOTE — Patient Instructions (Signed)

## 2023-08-28 ENCOUNTER — Other Ambulatory Visit: Payer: Self-pay | Admitting: Nurse Practitioner

## 2023-08-28 DIAGNOSIS — E88819 Insulin resistance, unspecified: Secondary | ICD-10-CM

## 2023-08-28 MED ORDER — METFORMIN HCL 500 MG PO TABS: 500.0000 mg | ORAL_TABLET | Freq: Every day | ORAL | 0 refills | Status: DC

## 2023-08-30 ENCOUNTER — Inpatient Hospital Stay

## 2023-08-30 VITALS — BP 122/60 | HR 74 | Temp 98.1°F | Resp 20

## 2023-08-30 DIAGNOSIS — N920 Excessive and frequent menstruation with regular cycle: Secondary | ICD-10-CM

## 2023-08-30 DIAGNOSIS — D500 Iron deficiency anemia secondary to blood loss (chronic): Secondary | ICD-10-CM | POA: Diagnosis not present

## 2023-08-30 MED ORDER — SODIUM CHLORIDE 0.9 % IV SOLN
300.0000 mg | Freq: Once | INTRAVENOUS | Status: AC
  Administered 2023-08-30: 300 mg via INTRAVENOUS
  Filled 2023-08-30: qty 300

## 2023-08-30 MED ORDER — SODIUM CHLORIDE 0.9 % IV SOLN: INTRAVENOUS | Status: DC

## 2023-08-30 NOTE — Patient Instructions (Signed)

## 2023-09-18 ENCOUNTER — Other Ambulatory Visit: Payer: Self-pay | Admitting: Family Medicine

## 2023-09-18 DIAGNOSIS — J309 Allergic rhinitis, unspecified: Secondary | ICD-10-CM

## 2023-10-18 ENCOUNTER — Ambulatory Visit: Admitting: Nurse Practitioner

## 2023-10-18 VITALS — BP 104/68 | HR 60 | Temp 98.4°F | Ht 66.0 in | Wt 194.0 lb

## 2023-10-18 DIAGNOSIS — E66811 Obesity, class 1: Secondary | ICD-10-CM | POA: Diagnosis not present

## 2023-10-18 DIAGNOSIS — R632 Polyphagia: Secondary | ICD-10-CM

## 2023-10-18 DIAGNOSIS — E88819 Insulin resistance, unspecified: Secondary | ICD-10-CM

## 2023-10-18 DIAGNOSIS — Z6831 Body mass index (BMI) 31.0-31.9, adult: Secondary | ICD-10-CM | POA: Diagnosis not present

## 2023-10-18 MED ORDER — OZEMPIC (0.25 OR 0.5 MG/DOSE) 2 MG/3ML ~~LOC~~ SOPN: 0.2500 mg | PEN_INJECTOR | SUBCUTANEOUS | 0 refills | Status: DC

## 2023-10-18 NOTE — Progress Notes (Signed)
 Office: [phone]  /  Fax: [phone]  WEIGHT SUMMARY AND BIOMETRICS  Weight Lost Since Last Visit: 0lb  Weight Gained Since Last Visit: 6lb   Vitals Temp: 98.4 F (36.9 C) BP: 104/68 Pulse Rate: 60 SpO2: 100 %   Anthropometric Measurements Height: 5' 6 (1.676 m) Weight: 194 lb (88 kg) BMI (Calculated): 31.33 Weight at Last Visit: 188lb Weight Lost Since Last Visit: 0lb Weight Gained Since Last Visit: 6lb Starting Weight: 205lb Total Weight Loss (lbs): 11 lb (4.99 kg)   Body Composition  Body Fat %: 35 % Fat Mass (lbs): 68 lbs Muscle Mass (lbs): 119.8 lbs Total Body Water (lbs): 80.8 lbs Visceral Fat Rating : 8   Other Clinical Data Fasting: No Labs: No Today's Visit #: 38 Starting Date: 06/13/18     HPI  Chief Complaint: OBESITY  Tamara Ball is here to discuss her progress with her obesity treatment plan. She is on the the Category 3 Plan and states she is following her eating plan approximately 20 % of the time. She states she is exercising 90 minutes 2-3 days per week.   Interval History:  Since last office visit she has gained 6 pounds. She is trying to make good choices and being mindful of what I'm eating.  She notes polyphagia and cravings especially sugar.  She has been traveling a lot for her job.  She hasn't been able to work out as she has in the past due to traveling and recently purchased a new house.   Pharmacotherapy for weight loss: She is not currently taking any medications for weight loss.    Previous pharmacotherapy for medical weight loss:   -Ozempic , Saxenda , Wegovy , Victoza  and Rybelsus .  Felt that Ozempic  worked the best for her but is unable to take due to cost.     Bariatric surgery:  Patient has not had bariatric surgery  PHYSICAL EXAM:  Blood pressure 104/68, pulse 60, temperature 98.4 F (36.9 C), height 5' 6 (1.676 m), weight 194 lb (88 kg), last menstrual period 10/02/2023, SpO2 100%. Body mass index is 31.31  kg/m.  General: She is overweight, cooperative, alert, well developed, and in no acute distress. PSYCH: Has normal mood, affect and thought process.   Extremities: No edema.  Neurologic: No gross sensory or motor deficits. No tremors or fasciculations noted.    DIAGNOSTIC DATA REVIEWED:  BMET    Component Value Date/Time   NA 141 08/09/2023 1029   K 4.5 08/09/2023 1029   K 4.2 08/11/2016 1404   CL 105 08/09/2023 1029   CL 103 08/11/2016 1404   CO2 23 08/09/2023 1029   CO2 26 08/11/2016 1404   GLUCOSE 81 08/09/2023 1029   GLUCOSE 94 03/26/2023 0753   BUN 10 08/09/2023 1029   CREATININE 0.90 08/09/2023 1029   CREATININE 1.02 (H) 03/26/2023 0753   CREATININE 0.87 08/11/2016 1404   CALCIUM 9.6 08/09/2023 1029   CALCIUM 9.5 08/11/2016 1404   GFRNONAA >60 03/26/2023 0753   GFRAA 83 01/27/2020 0844   Lab Results  Component Value Date   HGBA1C 5.3 08/09/2023   HGBA1C 5.0 04/07/2015   Lab Results  Component Value Date   INSULIN  21.6 08/09/2023   INSULIN  8.9 06/13/2018   Lab Results  Component Value Date   TSH 1.940 08/09/2023   CBC    Component Value Date/Time   WBC 6.4 08/09/2023 1029   WBC 6.2 03/26/2023 0753   WBC 9.4 06/20/2020 0405   RBC 4.42 08/09/2023 1029  RBC 5.00 03/26/2023 0753   RBC 5.01 03/26/2023 0753   HGB 11.4 08/09/2023 1029   HGB 14.2 04/20/2017 1524   HCT 37.0 08/09/2023 1029   HCT 43.2 04/20/2017 1524   PLT 261 08/09/2023 1029   MCV 84 08/09/2023 1029   MCV 90 04/20/2017 1524   MCH 25.8 (L) 08/09/2023 1029   MCH 27.8 03/26/2023 0753   MCHC 30.8 (L) 08/09/2023 1029   MCHC 32.3 03/26/2023 0753   RDW 13.3 08/09/2023 1029   RDW 13.5 04/20/2017 1524   Iron  Studies    Component Value Date/Time   IRON  24 (L) 08/09/2023 1029   IRON  103 04/20/2017 1524   TIBC 504 (H) 03/26/2023 0753   TIBC 450 (H) 04/20/2017 1524   FERRITIN 8 (L) 08/09/2023 1029   FERRITIN 23 04/20/2017 1524   IRONPCTSAT 10 (L) 03/26/2023 0753   IRONPCTSAT 23 04/20/2017  1524   IRONPCTSAT 18 (L) 10/30/2014 1213   Lipid Panel     Component Value Date/Time   CHOL 126 08/09/2023 1029   TRIG 26 08/09/2023 1029   HDL 59 08/09/2023 1029   CHOLHDL 2.2 07/13/2020 1421   CHOLHDL 2 05/27/2018 0848   VLDL 10.4 05/27/2018 0848   LDLCALC 59 08/09/2023 1029   Hepatic Function Panel     Component Value Date/Time   PROT 6.9 08/09/2023 1029   ALBUMIN 4.1 08/09/2023 1029   ALBUMIN 4.2 08/11/2016 1404   AST 15 08/09/2023 1029   AST 20 03/26/2023 0753   ALT 9 08/09/2023 1029   ALT 11 03/26/2023 0753   ALKPHOS 75 08/09/2023 1029   ALKPHOS 58 08/11/2016 1404   BILITOT 0.3 08/09/2023 1029   BILITOT 0.5 03/26/2023 0753   BILIDIR 0.0 06/24/2012 1055      Component Value Date/Time   TSH 1.940 08/09/2023 1029   Nutritional Lab Results  Component Value Date   VD25OH 51.3 07/19/2022   VD25OH 37.3 01/05/2022   VD25OH 45.01 02/24/2021     ASSESSMENT AND PLAN  TREATMENT PLAN FOR OBESITY:  Recommended Dietary Goals  Tamara Ball is currently in the action stage of change. As such, her goal is to continue weight management plan. She has agreed to practicing portion control and making smarter food choices, such as increasing vegetables and decreasing simple carbohydrates.  Behavioral Intervention  We discussed the following Behavioral Modification Strategies today: increasing lean protein intake to established goals, decreasing simple carbohydrates , increasing vegetables, increasing fiber rich foods, increasing water intake , work on meal planning and preparation, and continue to work on maintaining a reduced calorie state, getting the recommended amount of protein, incorporating whole foods, making healthy choices, staying well hydrated and practicing mindfulness when eating..  Additional resources provided today: NA  Recommended Physical Activity Goals  Tamara Ball has been advised to work up to 150 minutes of moderate intensity aerobic activity a week and  strengthening exercises 2-3 times per week for cardiovascular health, weight loss maintenance and preservation of muscle mass.   She has agreed to Think about enjoyable ways to increase daily physical activity and overcoming barriers to exercise, Increase physical activity in their day and reduce sedentary time (increase NEAT)., and continue to gradually increase the amount and intensity of exercise routine   ASSOCIATED CONDITIONS ADDRESSED TODAY  Action/Plan  Polyphagia -     Ozempic  (0.25 or 0.5 MG/DOSE); Inject 0.25 mg into the skin once a week.  Dispense: 3 mL; Refill: 0.  Side effects discussed.   Insulin  resistance -  Ozempic  (0.25 or 0.5 MG/DOSE); Inject 0.25 mg into the skin once a week.  Dispense: 3 mL; Refill: 0. Side effects discussed.   Class 1 obesity due to excess calories without serious comorbidity with body mass index (BMI) of 31.0 to 31.9 in adult      Labs reviewed in chart with patient from 08/09/2023.  She continues to follow-up with hematology for her iron  deficiency anemia.  She is scheduled to see them back in June.   Return in about 6 weeks (around 11/29/2023).Aaron Aas She was informed of the importance of frequent follow up visits to maximize her success with intensive lifestyle modifications for her multiple health conditions.   ATTESTASTION STATEMENTS:  Reviewed by clinician on day of visit: allergies, medications, problem list, medical history, surgical history, family history, social history, and previous encounter notes.    Crist Dominion. Harjit Douds FNP-C

## 2023-10-22 ENCOUNTER — Telehealth: Payer: Self-pay

## 2023-10-22 NOTE — Telephone Encounter (Signed)
 Received fax from [facility] that Ozempic  has been denied. Plan only covers this medication if the member has DM.

## 2023-10-22 NOTE — Telephone Encounter (Signed)
 PA submitted through Cover My Meds for Ozempic . Awaiting insurance determination.  Key: [id number]

## 2023-10-31 ENCOUNTER — Inpatient Hospital Stay: Admitting: Family

## 2023-10-31 ENCOUNTER — Inpatient Hospital Stay: Attending: Family

## 2023-11-03 ENCOUNTER — Other Ambulatory Visit: Payer: Self-pay

## 2023-11-03 ENCOUNTER — Ambulatory Visit
Admission: RE | Admit: 2023-11-03 | Discharge: 2023-11-03 | Disposition: A | Discharge status: Home / Self Care | Source: Ambulatory Visit | Attending: Physician Assistant | Admitting: Physician Assistant

## 2023-11-03 VITALS — BP 137/76 | HR 73 | Temp 97.9°F | Resp 18 | Ht 66.0 in | Wt 190.0 lb

## 2023-11-03 DIAGNOSIS — L02415 Cutaneous abscess of right lower limb: Secondary | ICD-10-CM

## 2023-11-03 NOTE — ED Provider Notes (Signed)
 [facility]    CSN: [id number] Arrival date & time: 11/03/23  1046      History   Chief Complaint Chief Complaint  Patient presents with  . Abscess    Developed a painful boil on my inner thigh after walking a lot in extreme [place] heat Wednesday. My legs were chafing badly. It started as a small bump that night, has grown, formed a head, and is very tender. Warm compresses haven't helped yet. - Entered by patient    HPI Tamara Ball is a 43 y.o. female.   HPI  Pt reports concerns for a boil on her inner thigh  She states it hurts with walking due to friction of her legs She states she thinks this happened after walking extensively in [place]  She denies drainage from the area, bleeding  She would like the area lanced if possible   Interventions: abx soap and neosporin, she has been keeping it covered with a bandaid to help prevent further irritation    Past Medical History:  Diagnosis Date  . Anemia   . Anxiety   . Chicken pox   . Chronic headaches   . Constipation   . Fibroid   . Fibroids   . Iron  deficiency anemia due to chronic blood loss    from heavy periods  . Menorrhagia   . Palpitations     Patient Active Problem List   Diagnosis Date Noted  . Impaired fasting glucose 09/29/2021  . Genetic testing 07/29/2021  . Pneumonia due to COVID-19 virus 06/02/2020  . Cough 12/02/2019  . Chest congestion 12/02/2019  . Migraine 09/19/2019  . Generalized obesity 06/17/2018  . Menorrhagia   . Iron  deficiency anemia due to chronic blood loss   . Allergic rhinitis 02/05/2014    History reviewed. No pertinent surgical history.  OB History     Gravida  3   Para      Term      Preterm      AB      Living  1      SAB      IAB      Ectopic      Multiple      Live Births               Home Medications    Prior to Admission medications   Medication Sig Start Date End Date Taking? Authorizing Provider  azelastine   (ASTELIN ) 0.1 % nasal spray Place 1 spray into both nostrils 2 (two) times daily. Use in each nostril as directed 04/30/23   Gladis Elsie BROCKS, PA-C  b complex vitamins tablet Take 1 tablet by mouth daily.    [provider]  Biotin 89999 MCG TABS Take 1 tablet by mouth daily.    [provider]  diphenhydrAMINE  HCl (BENADRYL  PO) Take by mouth as needed.    [provider]  fluticasone  (FLONASE ) 50 MCG/ACT nasal spray Place 2 sprays into both nostrils daily. 07/17/22   Kennyth Domino, FNP  Insulin  Pen Needle 31G X 5 MM MISC Use daily with Victoza  08/09/23   Becki Krabbe, FNP  levocetirizine (XYZAL ) 5 MG tablet TAKE 1 TABLET BY MOUTH ONCE DAILY IN THE EVENING 09/18/23   Colette Torrence GRADE, MD  metFORMIN  (GLUCOPHAGE ) 500 MG tablet Take 1 tablet (500 mg total) by mouth daily with breakfast. 08/28/23   Becki Krabbe, FNP  metoprolol  succinate (TOPROL  XL) 25 MG 24 hr tablet Take 1 tablet (25 mg  total) by mouth daily. 05/20/23   O'NealDarryle Ned, MD  metoprolol  tartrate (LOPRESSOR ) 50 MG tablet Take 2 hours before CT scan 04/17/23   O'Neal, Darryle Ned, MD  montelukast  (SINGULAIR ) 10 MG tablet TAKE 1 TABLET BY MOUTH AT BEDTIME 09/18/23   Rucker, Alethea Y, MD  Multiple Vitamin (MULTIVITAMIN WITH MINERALS) TABS tablet Take 1 tablet by mouth daily.    [provider]  Omega-3 Fatty Acids (FISH OIL) 1000 MG CAPS Take 1 capsule by mouth daily.    [provider]  polyethylene glycol (MIRALAX / GLYCOLAX) 17 g packet Take 17 g by mouth daily as needed.    [provider]  Semaglutide ,0.25 or 0.5MG /DOS, (OZEMPIC , 0.25 OR 0.5 MG/DOSE,) 2 MG/3ML SOPN Inject 0.25 mg into the skin once a week. 10/18/23   Becki Krabbe, FNP  VITAMIN D  PO Take 10,000 Units by mouth once a week.    [provider]    Family History Family History  Problem Relation Age of Onset  . Hypertension Mother   . Cancer Mother        SCC (x2) dx. 82 and  again 6 months later  . Other Sister        tumors- unknown type  . Cancer Maternal Aunt        colorectal cancer  . Hypertension Maternal Uncle   . Healthy Maternal Grandmother   . Diabetes Maternal Grandfather   . Glaucoma Maternal Grandfather   . Hypertension Maternal Grandfather   . Hyperlipidemia Maternal Grandfather   . Gout Maternal Grandfather   . Colon polyps Neg Hx   . Colon cancer Neg Hx   . Esophageal cancer Neg Hx     Social History Social History   Tobacco Use  . Smoking status: Never  . Smokeless tobacco: Never  Vaping Use  . Vaping status: Never Used  Substance Use Topics  . Alcohol use: Yes    Alcohol/week: 0.0 standard drinks of alcohol    Comment: social  . Drug use: No     Allergies   Patient has no known allergies.   Review of Systems Review of Systems  Skin:        Boil       Physical Exam Triage Vital Signs ED Triage Vitals  Encounter Vitals Group     BP 11/03/23 1137 137/76     Girls Systolic BP Percentile --      Girls Diastolic BP Percentile --      Boys Systolic BP Percentile --      Boys Diastolic BP Percentile --      Pulse Rate 11/03/23 1137 73     Resp 11/03/23 1137 18     Temp 11/03/23 1137 97.9 F (36.6 C)     Temp Source 11/03/23 1137 Oral     SpO2 11/03/23 1137 100 %     Weight 11/03/23 1141 190 lb (86.2 kg)     Height 11/03/23 1141 5' 6 (1.676 m)     Head Circumference --      Peak Flow --      Pain Score 11/03/23 1140 0     Pain Loc --      Pain Education --      Exclude from Growth Chart --    No data found.  Updated Vital Signs BP 137/76 (BP Location: Right Arm)   Pulse 73   Temp 97.9 F (36.6 C) (Oral)   Resp 18   Ht 5' 6 (1.676  m)   Wt 190 lb (86.2 kg)   LMP 10/26/2023 (Exact Date)   SpO2 100%   BMI 30.67 kg/m   Visual Acuity Right Eye Distance:   Left Eye Distance:   Bilateral Distance:    Right Eye Near:   Left Eye Near:    Bilateral Near:     Physical Exam Vitals reviewed.   Constitutional:      General: She is awake.     Appearance: Normal appearance. She is well-developed and well-groomed.  HENT:     Head: Normocephalic and atraumatic.   Eyes:     General: Lids are normal. Gaze aligned appropriately.     Extraocular Movements: Extraocular movements intact.     Conjunctiva/sclera: Conjunctivae normal.   Pulmonary:     Effort: Pulmonary effort is normal.   Skin:    General: Skin is warm and dry.     Findings: Abscess present.       Neurological:     Mental Status: She is alert and oriented to person, place, and time.   Psychiatric:        Attention and Perception: Attention and perception normal.        Mood and Affect: Mood and affect normal.        Speech: Speech normal.        Behavior: Behavior normal. Behavior is cooperative.     UC Treatments / Results  Labs (all labs ordered are listed, but only abnormal results are displayed) Labs Reviewed - No data to display  EKG   Radiology No results found.  Procedures Incision and Drainage  Date/Time: 11/03/2023 12:52 PM  Performed by: Marylene Rocky BRAVO, PA-C Authorized by: Marylene Rocky BRAVO, PA-C   Consent:    Consent obtained:  Verbal   Consent given by:  Patient   Risks, benefits, and alternatives were discussed: yes     Risks discussed:  Bleeding, incomplete drainage, pain and infection   Alternatives discussed:  No treatment, observation and alternative treatment Universal protocol:    Procedure explained and questions answered to patient or proxy's satisfaction: yes     Patient identity confirmed:  Verbally with patient Location:    Type:  Cyst   Location:  Lower extremity   Lower extremity location:  Leg   Leg location:  L upper leg Pre-procedure details:    Skin preparation:  Povidone-iodine Sedation:    Sedation type:  None Anesthesia:    Anesthesia method:  Local infiltration   Local anesthetic:  Lidocaine 1% w/o epi Procedure type:    Complexity:  Simple Procedure  details:    Incision types:  Single straight   Incision depth:  Dermal   Wound management:  Probed and deloculated   Drainage:  Bloody and purulent   Drainage amount:  Scant   Wound treatment:  Wound left open   Packing materials:  None Post-procedure details:    Procedure completion:  Tolerated well, no immediate complications  (including critical care time)  Medications Ordered in UC Medications - No data to display  Initial Impression / Assessment and Plan / UC Course  I have reviewed the triage vital signs and the nursing notes.  Pertinent labs & imaging results that were available during my care of the patient were reviewed by me and considered in my medical decision making (see chart for details).      Final Clinical Impressions(s) / UC Diagnoses   Final diagnoses:  Abscess of right thigh  Discharge Instructions      You were seen today for concerns of a boil or abscess on your right thigh We performed an incision and drainage of the area and this seems to have produced some purulent material from the boil and provide some swelling reduction Over the next few days you may continue to have some drainage and mild bleeding from the area, this is normal You can apply warm compresses to the area or take warm water soaks with epsom salts to encourage further drainage as needed. If desired you can use Tylenol  and ibuprofen  for pain management Please do not manipulate or squeeze the area to encourage more drainage as this could cause an infection and pain  If you notice any of the following please return here or go to the ED for severe symptoms: swelling in the area, red streaks radiating from the wound, a lot of drainage (way more than expected) or prolonged drainage or bleeding, fever or chills, pain that is spreading out from the area      ED Prescriptions   None    PDMP not reviewed this encounter.

## 2023-11-03 NOTE — ED Triage Notes (Signed)
 Pt presents with complaints of abscess on right inner thigh x 4 days. States she went to [facility] and walked in 105 degree weather, legs were chafing. Currently denies pain. Reports there is only pain with friction of the legs. Neosporin cream applied with a band-aid on top PTA.

## 2023-11-03 NOTE — Discharge Instructions (Signed)
 You were seen today for concerns of a boil or abscess on your right thigh We performed an incision and drainage of the area and this seems to have produced some purulent material from the boil and provide some swelling reduction Over the next few days you may continue to have some drainage and mild bleeding from the area, this is normal You can apply warm compresses to the area or take warm water soaks with epsom salts to encourage further drainage as needed. If desired you can use Tylenol  and ibuprofen  for pain management Please do not manipulate or squeeze the area to encourage more drainage as this could cause an infection and pain  If you notice any of the following please return here or go to the ED for severe symptoms: swelling in the area, red streaks radiating from the wound, a lot of drainage (way more than expected) or prolonged drainage or bleeding, fever or chills, pain that is spreading out from the area

## 2023-11-14 ENCOUNTER — Telehealth: Admitting: Physician Assistant

## 2023-11-14 DIAGNOSIS — J310 Chronic rhinitis: Secondary | ICD-10-CM | POA: Diagnosis not present

## 2023-11-14 DIAGNOSIS — L299 Pruritus, unspecified: Secondary | ICD-10-CM | POA: Diagnosis not present

## 2023-11-14 MED ORDER — CETIRIZINE HCL 10 MG PO TABS: 10.0000 mg | ORAL_TABLET | Freq: Every day | ORAL | 0 refills | Status: DC

## 2023-11-14 MED ORDER — MONTELUKAST SODIUM 10 MG PO TABS
10.0000 mg | ORAL_TABLET | Freq: Every day | ORAL | 0 refills | Status: DC
Start: 2023-11-14 — End: 2023-12-10

## 2023-11-14 NOTE — Progress Notes (Signed)
 E visit for Allergic Rhinitis We are sorry that you are not feeling well.  Here is how we plan to help!  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  Rhinitis is when a reaction occurs that causes nasal congestion, runny nose, sneezing, and itching.  Most types of rhinitis are caused by an inflammation and are associated with symptoms in the eyes ears or throat. There are several types of rhinitis.  The most common are acute rhinitis, which is usually caused by a viral illness, allergic or seasonal rhinitis, and nonallergic or year-round rhinitis.  Nasal allergies occur certain times of the year.  Allergic rhinitis is caused when allergens in the air trigger the release of histamine in the body.  Histamine causes itching, swelling, and fluid to build up in the fragile linings of the nasal passages, sinuses and eyelids.  An itchy nose and clear discharge are common.  I recommend the following over the counter treatments: I have prescribed Cetirizine  10mg  and Montelukast  10mg  to take daily  The itching is most likely withdrawal from the Cetirizine , which was only just recently found to be an adverse effect with suddenly stopping Cetirizine  (Zyrtec ) or Levocetirizine (Xyzal ).   I will just refill these for one month. If needed longer please follow up with your PCP for refills. If you are desiring to stop please discuss with your PCP a tapering dose of Cetirizine .   HOME CARE:  You can use an over-the-counter saline nasal spray as needed Avoid areas where there is heavy dust, mites, or molds Stay indoors on windy days during the pollen season Keep windows closed in home, at least in bedroom; use air conditioner. Use high-efficiency house air filter Keep windows closed in car, turn AC on re-circulate Avoid playing out with dog during pollen season  GET HELP RIGHT AWAY IF:  If your symptoms do not improve within 10 days You become short of breath You  develop yellow or green discharge from your nose for over 3 days You have coughing fits  MAKE SURE YOU:  Understand these instructions Will watch your condition Will get help right away if you are not doing well or get worse  Thank you for choosing an e-visit. Your e-visit answers were reviewed by a board certified advanced clinical practitioner to complete your personal care plan. Depending upon the condition, your plan could have included both over the counter or prescription medications. Please review your pharmacy choice. Be sure that the pharmacy you have chosen is open so that you can pick up your prescription now.  If there is a problem you may message your provider in MyChart to have the prescription routed to another pharmacy. Your safety is important to us . If you have drug allergies check your prescription carefully.  For the next 24 hours, you can use MyChart to ask questions about today's visit, request a non-urgent call back, or ask for a work or school excuse from your e-visit provider. You will get an email in the next two days asking about your experience. I hope that your e-visit has been valuable and will speed your recovery.   I have spent 5 minutes in review of e-visit questionnaire, review and updating patient chart, medical decision making and response to patient.   Tamara CHRISTELLA Dickinson, PA-C

## 2023-11-15 ENCOUNTER — Encounter (INDEPENDENT_AMBULATORY_CARE_PROVIDER_SITE_OTHER): Payer: Self-pay

## 2023-11-15 ENCOUNTER — Encounter: Payer: Self-pay | Admitting: Internal Medicine

## 2023-11-15 ENCOUNTER — Encounter: Payer: Self-pay | Admitting: Family

## 2023-11-15 ENCOUNTER — Ambulatory Visit: Payer: Self-pay | Admitting: Internal Medicine

## 2023-11-15 ENCOUNTER — Other Ambulatory Visit: Payer: Self-pay

## 2023-11-15 VITALS — BP 110/72 | HR 67 | Temp 98.2°F | Resp 18 | Ht 66.0 in | Wt 203.0 lb

## 2023-11-15 DIAGNOSIS — J310 Chronic rhinitis: Secondary | ICD-10-CM

## 2023-11-15 DIAGNOSIS — J328 Other chronic sinusitis: Secondary | ICD-10-CM

## 2023-11-15 DIAGNOSIS — J342 Deviated nasal septum: Secondary | ICD-10-CM | POA: Diagnosis not present

## 2023-11-15 NOTE — Progress Notes (Signed)
 NEW PATIENT Date of Service/Encounter:  11/16/23 Referring provider: Colette Torrence GRADE, MD Primary care provider: Colette Torrence GRADE, MD  Subjective:  Tamara Ball is a 43 y.o. female  presenting today for evaluation of nonallergic rhinitis  History obtained from: chart review and patient.   Discussed the use of AI scribe software for clinical note transcription with the patient, who gave verbal consent to proceed.  History of Present Illness Tamara Ball is a 43 year old female with chronic rhinosinusitis who presents with persistent nasal congestion and sinus infections.  Nasal congestion and sinus symptoms - Persistent nasal congestion and recurrent sinus infections for over nine years - Symptoms worsened after moving back from [place]  in 2007 - Sinus infections occur approximately two to four times per year - Symptoms primarily consist of a stuffy nose without significant drainage - Sense of smell and taste remain intact  Therapeutic interventions and medication response - Montelukast  and levocetirizine provide symptom control - Missing a dose of levocetirizine results in severe itching of the throat, lips, eyes, and scalp - Occasional use of Claritin  for additional relief - Nasal sprays such as Flonase  have been tried with mixed results, sometimes worsening congestion - No history of oral steroid (prednisone ) use for symptoms  Allergy and imaging evaluation - Allergy testing negative, leading to diagnosis of nonallergic rhinitis - ENT evaluation in 2017 recommended surgery, but declined due to 50% estimated success rate cited by ENT per patient - CT scan in 2022 demonstrated sinus disease    Chart Review:  ENT 2017: Deviated septum, turbinate hypertrophy right greater than left, nasal obstruction on the right.  Sinus x-ray showed left large maxillary polyp, rightward septal deviation otherwise clear; treated with antibiotics and prednisone   2022: CT Sinuses/Orbits:  Mild bilateral paranasal sinus mucosal thickening, most pronounced in the ethmoids.   08/10/2023: AEC 300   Other allergy screening: Asthma: no Rhino conjunctivitis: yes Food allergy: no Medication allergy: no Hymenoptera allergy: no Urticaria: no Eczema:no History of recurrent infections suggestive of immunodeficency: no Vaccinations are up to date.   Past Medical History: Past Medical History:  Diagnosis Date   Anemia    Anxiety    Chicken pox    Chronic headaches    Constipation    Fibroid    Fibroids    Iron  deficiency anemia due to chronic blood loss    from heavy periods   Menorrhagia    Palpitations    Medication List:  Current Outpatient Medications  Medication Sig Dispense Refill   azelastine  (ASTELIN ) 0.1 % nasal spray Place 1 spray into both nostrils 2 (two) times daily. Use in each nostril as directed 30 mL 0   b complex vitamins tablet Take 1 tablet by mouth daily.     Biotin 89999 MCG TABS Take 1 tablet by mouth daily.     cetirizine  (ZYRTEC ) 10 MG tablet Take 1 tablet (10 mg total) by mouth daily. 30 tablet 0   diphenhydrAMINE  HCl (BENADRYL  PO) Take by mouth as needed.     fluticasone  (FLONASE ) 50 MCG/ACT nasal spray Place 2 sprays into both nostrils daily. 16 g 6   Insulin  Pen Needle 31G X 5 MM MISC Use daily with Victoza  50 each 6   montelukast  (SINGULAIR ) 10 MG tablet Take 1 tablet (10 mg total) by mouth at bedtime. 30 tablet 0   Multiple Vitamin (MULTIVITAMIN WITH MINERALS) TABS tablet Take 1 tablet by mouth daily.     Omega-3 Fatty Acids (FISH OIL) 1000  MG CAPS Take 1 capsule by mouth daily.     polyethylene glycol (MIRALAX / GLYCOLAX) 17 g packet Take 17 g by mouth daily as needed.     VITAMIN D  PO Take 10,000 Units by mouth once a week.     No current facility-administered medications for this visit.   Known Allergies:  No Known Allergies Past Surgical History: No past surgical history on file. Family History: Family History  Problem Relation  Age of Onset   Hypertension Mother    Cancer Mother        SCC (x2) dx. 47 and again 6 months later   Other Sister        tumors- unknown type   Cancer Maternal Aunt        colorectal cancer   Hypertension Maternal Uncle    Healthy Maternal Grandmother    Diabetes Maternal Grandfather    Glaucoma Maternal Grandfather    Hypertension Maternal Grandfather    Hyperlipidemia Maternal Grandfather    Gout Maternal Grandfather    Colon polyps Neg Hx    Colon cancer Neg Hx    Esophageal cancer Neg Hx    Social History: Tamara Ball lives townhome, no water damage in the house.  Would flooring carpet in bedroom.  Electric heating central cooling.  No roaches in the house and bed is 2 feet off the floor.  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  No tobacco exposure.   ROS:  All other systems negative except as noted per HPI.  Objective:  Blood pressure 110/72, pulse 67, temperature 98.2 F (36.8 C), temperature source Temporal, resp. rate 18, height 5' 6 (1.676 m), weight 203 lb (92.1 kg), last menstrual period 10/26/2023, SpO2 100%. Body mass index is 32.77 kg/m. Physical Exam:  General Appearance:  Alert, cooperative, no distress, appears stated age  Head:  Normocephalic, without obvious abnormality, atraumatic  Eyes:  Conjunctiva clear, EOM's intact  Ears EACs normal bilaterally  Nose: Nares normal, septum deviated to the right, edematous pale mucosa , hypertrophic turbinates, and no visible anterior polyps  Throat: Lips, tongue normal; teeth and gums normal, + cobblestoning  Neck: Supple, symmetrical  Lungs:   clear to auscultation bilaterally, Respirations unlabored, no coughing  Heart:  regular rate and rhythm and no murmur, Appears well perfused  Extremities: No edema  Skin: Skin color, texture, turgor normal and no rashes or lesions on visualized portions of skin  Neurologic: No gross deficits   Diagnostics: None done    Labs:  Lab Orders         Allergens Zone 2       Assessment and  Plan  Assessment and Plan Assessment & Plan Chronic rhinosinusitis Chronic rhinosinusitis with recurrent sinus infections. Imaging shows sinus disease. Previous treatments include montelukast  and levocetirizine, with levocetirizine more effective for itching. Nasal sprays inconsistently effective. - Refer to ENT, Dr. Tobie or Dr. Thomasina at [facility], for surgical evaluation and potential new treatments. - Order labs to confirm previous negative allergy testing. Consider extended skin test panel if positive. - Start Flonase  nasal spray, one spray per nostril twice daily, with proper technique. - Consider adding sinus rinses (use 10-15 minutes prior to nasal sprays)   Deviated nasal septum Significantly deviated nasal septum contributing to symptoms. Current imaging supports potential benefit from surgical correction. - Refer to ENT for evaluation of surgical correction.  Follow up: we will call you with labs and next steps       This note in its entirety was  forwarded to the Provider who requested this consultation.  Other:    Thank you for your kind referral. I appreciate the opportunity to take part in Tamara Ball's care. Please do not hesitate to contact me with questions.  Sincerely,  Thank you so much for letting me partake in your care today.  Don't hesitate to reach out if you have any additional concerns!  Hargis Springer, MD  [facility] [place], [place]

## 2023-11-15 NOTE — Patient Instructions (Addendum)
 Chronic rhinosinusitis Chronic rhinosinusitis with recurrent sinus infections. Imaging shows sinus disease. Previous treatments include montelukast  and levocetirizine, with levocetirizine more effective for itching. Nasal sprays inconsistently effective. - Refer to ENT, for surgical evaluation and potential new treatments. - Order labs to confirm previous negative allergy testing. Consider extended skin test panel if positive. - Start Flonase  nasal spray, one spray per nostril twice daily, with proper technique. - Consider adding sinus rinses (use 10-15 minutes prior to nasal sprays)   Deviated nasal septum Significantly deviated nasal septum contributing to symptoms. Current imaging supports potential benefit from surgical correction. - Refer to ENT for evaluation of surgical correction.  Follow up: we will call you with labs and next steps

## 2023-11-18 LAB — IGE+ALLERGENS ZONE 2(30)

## 2023-11-29 ENCOUNTER — Ambulatory Visit: Admitting: Nurse Practitioner

## 2023-11-29 ENCOUNTER — Encounter: Payer: Self-pay | Admitting: Nurse Practitioner

## 2023-11-29 VITALS — BP 101/66 | HR 70 | Ht 66.0 in | Wt 196.0 lb

## 2023-11-29 DIAGNOSIS — R632 Polyphagia: Secondary | ICD-10-CM | POA: Diagnosis not present

## 2023-11-29 DIAGNOSIS — Z6831 Body mass index (BMI) 31.0-31.9, adult: Secondary | ICD-10-CM

## 2023-11-29 DIAGNOSIS — E88819 Insulin resistance, unspecified: Secondary | ICD-10-CM

## 2023-11-29 DIAGNOSIS — E66811 Obesity, class 1: Secondary | ICD-10-CM | POA: Diagnosis not present

## 2023-11-29 MED ORDER — METFORMIN HCL 500 MG PO TABS: 500.0000 mg | ORAL_TABLET | Freq: Every day | ORAL | 0 refills | Status: AC

## 2023-11-29 MED ORDER — LIRAGLUTIDE 18 MG/3ML ~~LOC~~ SOPN: 1.8000 mg | PEN_INJECTOR | Freq: Every day | SUBCUTANEOUS | 1 refills | Status: DC

## 2023-11-29 MED ORDER — METFORMIN HCL 500 MG PO TABS
500.0000 mg | ORAL_TABLET | Freq: Every day | ORAL | 0 refills | Status: DC
Start: 2023-11-29 — End: 2023-11-29

## 2023-11-29 NOTE — Progress Notes (Signed)
 Office: [phone]  /  Fax: [phone]  WEIGHT SUMMARY AND BIOMETRICS  Weight Lost Since Last Visit: 0lb  Weight Gained Since Last Visit: 2lb   Vitals BP: 101/66 Pulse Rate: 70 SpO2: 100 %   Anthropometric Measurements Height: 5' 6 (1.676 m) Weight: 196 lb (88.9 kg) BMI (Calculated): 31.65 Weight at Last Visit: 194lb Weight Lost Since Last Visit: 0lb Weight Gained Since Last Visit: 2lb Starting Weight: 205lb Total Weight Loss (lbs): 9 lb (4.082 kg)   Body Composition  Body Fat %: 35 % Fat Mass (lbs): 68.8 lbs Muscle Mass (lbs): 121.4 lbs Total Body Water (lbs): 81.8 lbs Visceral Fat Rating : 8   Other Clinical Data Fasting: No Labs: no Today's Visit #: 4 Starting Date: 06/13/18     HPI  Chief Complaint: OBESITY  Derisha is here to discuss her progress with her obesity treatment plan. She is on the the Category 3 Plan and states she is following her eating plan approximately 80 % of the time. She states she is exercising 60 minutes 3-4 days per week.   Interval History:  Since last office visit she has gained 2 pounds.  She has been traveling for work.  She is doing resistance training and cardio 3-4 days per week. Started going back to the gym over the past month on a consistently.    Pharmacotherapy for weight loss: She is currently taking Victoza  1.8mg  off label for weight loss.  Denies side effects.   Reports polyphagia and cravings.    Previous pharmacotherapy for medical weight loss:   -Ozempic , Saxenda , Victoza  and Rybelsus .  Felt that Ozempic  worked the best for her but is unable to take due to cost.     Bariatric surgery:  Patient has not had bariatric surgery  Insulin  Resistance Last fasting insulin  was 21.6. A1c was 5.3. Polyphagia:Yes Medication(s): wasn't able to start Ozempic  due to cost.  Not taking Metformin .   Restarted taking Victoza  since her last visit  Lab Results  Component Value Date   HGBA1C 5.3 08/09/2023   HGBA1C  5.3 04/04/2023   HGBA1C 5.4 07/19/2022   HGBA1C 5.2 01/05/2022   HGBA1C 5.0 07/13/2020   Lab Results  Component Value Date   INSULIN  21.6 08/09/2023   INSULIN  10.6 04/04/2023   INSULIN  10.2 07/19/2022   INSULIN  6.0 01/05/2022   INSULIN  7.6 07/13/2020    PHYSICAL EXAM:  Blood pressure 101/66, pulse 70, height 5' 6 (1.676 m), weight 196 lb (88.9 kg), last menstrual period 10/26/2023, SpO2 100%. Body mass index is 31.64 kg/m.  General: She is overweight, cooperative, alert, well developed, and in no acute distress. PSYCH: Has normal mood, affect and thought process.   Extremities: No edema.  Neurologic: No gross sensory or motor deficits. No tremors or fasciculations noted.    DIAGNOSTIC DATA REVIEWED:  BMET    Component Value Date/Time   NA 141 08/09/2023 1029   K 4.5 08/09/2023 1029   K 4.2 08/11/2016 1404   CL 105 08/09/2023 1029   CL 103 08/11/2016 1404   CO2 23 08/09/2023 1029   CO2 26 08/11/2016 1404   GLUCOSE 81 08/09/2023 1029   GLUCOSE 94 03/26/2023 0753   BUN 10 08/09/2023 1029   CREATININE 0.90 08/09/2023 1029   CREATININE 1.02 (H) 03/26/2023 0753   CREATININE 0.87 08/11/2016 1404   CALCIUM 9.6 08/09/2023 1029   CALCIUM 9.5 08/11/2016 1404   GFRNONAA >60 03/26/2023 0753   GFRAA 83 01/27/2020 0844   Lab Results  Component Value Date   HGBA1C 5.3 08/09/2023   HGBA1C 5.0 04/07/2015   Lab Results  Component Value Date   INSULIN  21.6 08/09/2023   INSULIN  8.9 06/13/2018   Lab Results  Component Value Date   TSH 1.940 08/09/2023   CBC    Component Value Date/Time   WBC 6.4 08/09/2023 1029   WBC 6.2 03/26/2023 0753   WBC 9.4 06/20/2020 0405   RBC 4.42 08/09/2023 1029   RBC 5.00 03/26/2023 0753   RBC 5.01 03/26/2023 0753   HGB 11.4 08/09/2023 1029   HGB 14.2 04/20/2017 1524   HCT 37.0 08/09/2023 1029   HCT 43.2 04/20/2017 1524   PLT 261 08/09/2023 1029   MCV 84 08/09/2023 1029   MCV 90 04/20/2017 1524   MCH 25.8 (L) 08/09/2023 1029   MCH  27.8 03/26/2023 0753   MCHC 30.8 (L) 08/09/2023 1029   MCHC 32.3 03/26/2023 0753   RDW 13.3 08/09/2023 1029   RDW 13.5 04/20/2017 1524   Iron  Studies    Component Value Date/Time   IRON  24 (L) 08/09/2023 1029   IRON  103 04/20/2017 1524   TIBC 504 (H) 03/26/2023 0753   TIBC 450 (H) 04/20/2017 1524   FERRITIN 8 (L) 08/09/2023 1029   FERRITIN 23 04/20/2017 1524   IRONPCTSAT 10 (L) 03/26/2023 0753   IRONPCTSAT 23 04/20/2017 1524   IRONPCTSAT 18 (L) 10/30/2014 1213   Lipid Panel     Component Value Date/Time   CHOL 126 08/09/2023 1029   TRIG 26 08/09/2023 1029   HDL 59 08/09/2023 1029   CHOLHDL 2.2 07/13/2020 1421   CHOLHDL 2 05/27/2018 0848   VLDL 10.4 05/27/2018 0848   LDLCALC 59 08/09/2023 1029   Hepatic Function Panel     Component Value Date/Time   PROT 6.9 08/09/2023 1029   ALBUMIN 4.1 08/09/2023 1029   ALBUMIN 4.2 08/11/2016 1404   AST 15 08/09/2023 1029   AST 20 03/26/2023 0753   ALT 9 08/09/2023 1029   ALT 11 03/26/2023 0753   ALKPHOS 75 08/09/2023 1029   ALKPHOS 58 08/11/2016 1404   BILITOT 0.3 08/09/2023 1029   BILITOT 0.5 03/26/2023 0753   BILIDIR 0.0 06/24/2012 1055      Component Value Date/Time   TSH 1.940 08/09/2023 1029   Nutritional Lab Results  Component Value Date   VD25OH 51.3 07/19/2022   VD25OH 37.3 01/05/2022   VD25OH 45.01 02/24/2021     ASSESSMENT AND PLAN  TREATMENT PLAN FOR OBESITY:  Recommended Dietary Goals  Avanna is currently in the action stage of change. As such, her goal is to continue weight management plan. She has agreed to the Category 3 Plan.  Behavioral Intervention  We discussed the following Behavioral Modification Strategies today: increasing lean protein intake to established goals, decreasing simple carbohydrates , increasing vegetables, increasing fiber rich foods, increasing water intake , work on meal planning and preparation, reading food labels , keeping healthy foods at home, continue to practice  mindfulness when eating, planning for success, and continue to work on maintaining a reduced calorie state, getting the recommended amount of protein, incorporating whole foods, making healthy choices, staying well hydrated and practicing mindfulness when eating..  Additional resources provided today: NA  Recommended Physical Activity Goals  Zeda has been advised to work up to 150 minutes of moderate intensity aerobic activity a week and strengthening exercises 2-3 times per week for cardiovascular health, weight loss maintenance and preservation of muscle mass.   She has agreed to  Think about enjoyable ways to increase daily physical activity and overcoming barriers to exercise, Increase physical activity in their day and reduce sedentary time (increase NEAT)., and continue to gradually increase the amount and intensity of exercise routine   Pharmacotherapy We discussed various medication options to help [facility] with her weight loss efforts and we both agreed to continue Victoza  1.8mg  off label for weight loss.  Side effects discussed.  ASSOCIATED CONDITIONS ADDRESSED TODAY  Action/Plan  Insulin  resistance -     Start metFORMIN  HCl; Take 1 tablet (500 mg total) by mouth daily with breakfast.  Dispense: 30 tablet; Refill: 0. Side effects discussed.    Polyphagia -     Liraglutide ; Inject 1.8 mg into the skin daily.  Dispense: 9 mL; Refill: 1  Class 1 obesity due to excess calories without serious comorbidity with body mass index (BMI) of 31.0 to 31.9 in adult -     Liraglutide ; Inject 1.8 mg into the skin daily.  Dispense: 9 mL; Refill: 1         Return in about 6 weeks (around 01/10/2024).SABRA She was informed of the importance of frequent follow up visits to maximize her success with intensive lifestyle modifications for her multiple health conditions.   ATTESTASTION STATEMENTS:  Reviewed by clinician on day of visit: allergies, medications, problem list, medical history, surgical  history, family history, social history, and previous encounter notes.     Corean SAUNDERS. Shekira Drummer FNP-C

## 2023-12-06 ENCOUNTER — Ambulatory Visit: Payer: Self-pay | Admitting: Internal Medicine

## 2023-12-06 NOTE — Progress Notes (Signed)
 Blood work was negative for all environmentals.  Can someone let patient know

## 2023-12-07 ENCOUNTER — Encounter: Payer: Self-pay | Admitting: Family

## 2023-12-07 ENCOUNTER — Inpatient Hospital Stay: Admitting: Family

## 2023-12-07 ENCOUNTER — Inpatient Hospital Stay: Attending: Family

## 2023-12-07 VITALS — BP 113/60 | HR 69 | Temp 98.0°F | Resp 20 | Ht 66.0 in | Wt 198.8 lb

## 2023-12-07 DIAGNOSIS — N920 Excessive and frequent menstruation with regular cycle: Secondary | ICD-10-CM

## 2023-12-07 DIAGNOSIS — D259 Leiomyoma of uterus, unspecified: Secondary | ICD-10-CM | POA: Insufficient documentation

## 2023-12-07 DIAGNOSIS — D500 Iron deficiency anemia secondary to blood loss (chronic): Secondary | ICD-10-CM

## 2023-12-07 LAB — CBC WITH DIFFERENTIAL (CANCER CENTER ONLY)
Abs Immature Granulocytes: 0.03 K/uL (ref 0.00–0.07)
Basophils Absolute: 0.1 K/uL (ref 0.0–0.1)
Basophils Relative: 1 %
Eosinophils Absolute: 0.3 K/uL (ref 0.0–0.5)
Eosinophils Relative: 4 %
HCT: 37.8 % (ref 36.0–46.0)
Hemoglobin: 11.7 g/dL — ABNORMAL LOW (ref 12.0–15.0)
Immature Granulocytes: 0 %
Lymphocytes Relative: 24 %
Lymphs Abs: 1.6 K/uL (ref 0.7–4.0)
MCH: 25.4 pg — ABNORMAL LOW (ref 26.0–34.0)
MCHC: 31 g/dL (ref 30.0–36.0)
MCV: 82 fL (ref 80.0–100.0)
Monocytes Absolute: 0.5 K/uL (ref 0.1–1.0)
Monocytes Relative: 8 %
Neutro Abs: 4.3 K/uL (ref 1.7–7.7)
Neutrophils Relative %: 63 %
Platelet Count: 252 K/uL (ref 150–400)
RBC: 4.61 MIL/uL (ref 3.87–5.11)
RDW: 14.4 % (ref 11.5–15.5)
WBC Count: 6.8 K/uL (ref 4.0–10.5)
nRBC: 0 % (ref 0.0–0.2)

## 2023-12-07 LAB — CMP (CANCER CENTER ONLY)
ALT: 9 U/L (ref 0–44)
AST: 16 U/L (ref 15–41)
Albumin: 4.4 g/dL (ref 3.5–5.0)
Alkaline Phosphatase: 67 U/L (ref 38–126)
Anion gap: 10 (ref 5–15)
BUN: 10 mg/dL (ref 6–20)
CO2: 26 mmol/L (ref 22–32)
Calcium: 9.9 mg/dL (ref 8.9–10.3)
Chloride: 104 mmol/L (ref 98–111)
Creatinine: 0.98 mg/dL (ref 0.44–1.00)
GFR, Estimated: >60 mL/min (ref >60)
Glucose, Bld: 82 mg/dL (ref 70–99)
Potassium: 4.8 mmol/L (ref 3.5–5.1)
Sodium: 139 mmol/L (ref 135–145)
Total Bilirubin: 0.4 mg/dL (ref 0.0–1.2)
Total Protein: 7.4 g/dL (ref 6.5–8.1)

## 2023-12-07 LAB — IRON AND IRON BINDING CAPACITY (CC-WL,HP ONLY)
Iron: 23 ug/dL — ABNORMAL LOW (ref 28–170)
Saturation Ratios: 4 % — ABNORMAL LOW (ref 10.4–31.8)
TIBC: 531 ug/dL — ABNORMAL HIGH (ref 250–450)
UIBC: 508 ug/dL

## 2023-12-07 LAB — FERRITIN: Ferritin: 7 ng/mL — ABNORMAL LOW (ref 11–307)

## 2023-12-07 NOTE — Progress Notes (Signed)
 Hematology and Oncology Follow Up Visit  Tamara Ball [id number] September 03, 1980 43 y.o. 12/07/2023   Principle Diagnosis:  Iron  deficiency anemia secondary to menorrhagia    Current Therapy:        IV iron  as indicated    Interim History:  Tamara Ball is here today for follow-up. She is doing well but notes persistent fatigue and palpitations.  Her cycle is regular with heavy flow 2 out of 7 days. She has not noted any other blood loss.  No abnormal bruising, no petechiae.  No fever, chills, n/v, cough, rash, dizziness, SOB, chest pain, abdominal pain or changes in bowel or bladder habits.  No swelling, numbness or tingling in her extremities at this time.  She has occasional pain that shoots down the left arm or left leg that can wake her up while sleeping.  No falls or syncope reported.  Appetite and hydration are good. Weight is stable at 198 lbs.   ECOG Performance Status: 1 - Symptomatic but completely ambulatory  Medications:  Allergies as of 12/07/2023   No Known Allergies      Medication List        Accurate as of December 07, 2023  1:14 PM. If you have any questions, ask your nurse or doctor.          azelastine  0.1 % nasal spray Commonly known as: ASTELIN  Place 1 spray into both nostrils 2 (two) times daily. Use in each nostril as directed   b complex vitamins tablet Take 1 tablet by mouth daily.   BENADRYL  PO Take by mouth as needed.   Biotin 10000 MCG Tabs Take 1 tablet by mouth daily.   cetirizine  10 MG tablet Commonly known as: ZYRTEC  Take 1 tablet (10 mg total) by mouth daily.   Fish Oil 1000 MG Caps Take 1 capsule by mouth daily.   fluticasone  50 MCG/ACT nasal spray Commonly known as: FLONASE  Place 2 sprays into both nostrils daily.   Insulin  Pen Needle 31G X 5 MM Misc Use daily with Victoza    liraglutide  18 MG/3ML Sopn Commonly known as: VICTOZA  Inject 1.8 mg into the skin daily.   metFORMIN  500 MG tablet Commonly known as:  GLUCOPHAGE  Take 1 tablet (500 mg total) by mouth daily with breakfast.   montelukast  10 MG tablet Commonly known as: SINGULAIR  Take 1 tablet (10 mg total) by mouth at bedtime.   multivitamin with minerals Tabs tablet Take 1 tablet by mouth daily.   polyethylene glycol 17 g packet Commonly known as: MIRALAX / GLYCOLAX Take 17 g by mouth daily as needed.   VITAMIN D  PO Take 10,000 Units by mouth once a week.        Allergies: No Known Allergies  Past Medical History, Surgical history, Social history, and Family History were reviewed and updated.  Review of Systems: All other 10 point review of systems is negative.   Physical Exam:  vitals were not taken for this visit.   Wt Readings from Last 3 Encounters:  11/29/23 196 lb (88.9 kg)  11/15/23 203 lb (92.1 kg)  11/03/23 190 lb (86.2 kg)    Ocular: Sclerae unicteric, pupils equal, round and reactive to light Ear-nose-throat: Oropharynx clear, dentition fair Lymphatic: No cervical or supraclavicular adenopathy Lungs no rales or rhonchi, good excursion bilaterally Heart regular rate and rhythm, no murmur appreciated Abd soft, nontender, positive bowel sounds MSK no focal spinal tenderness, no joint edema Neuro: non-focal, well-oriented, appropriate affect Breasts: Deferred   Lab Results  Component  Value Date   WBC 6.8 12/07/2023   HGB 11.7 (L) 12/07/2023   HCT 37.8 12/07/2023   MCV 82.0 12/07/2023   PLT 252 12/07/2023   Lab Results  Component Value Date   FERRITIN 8 (L) 08/09/2023   IRON  24 (L) 08/09/2023   TIBC 504 (H) 03/26/2023   UIBC 455 (H) 03/26/2023   IRONPCTSAT 10 (L) 03/26/2023   Lab Results  Component Value Date   RETICCTPCT 1.2 03/26/2023   RBC 4.61 12/07/2023   RETICCTABS 64,400 05/16/2016   No results found for: KPAFRELGTCHN, LAMBDASER, KAPLAMBRATIO No results found for: KIMBERLY LE, IGMSERUM No results found for: STEPHANY CARLOTA BENSON MARKEL EARLA JOANNIE DOC VICK, SPEI   Chemistry      Component Value Date/Time   NA 141 08/09/2023 1029   K 4.5 08/09/2023 1029   K 4.2 08/11/2016 1404   CL 105 08/09/2023 1029   CL 103 08/11/2016 1404   CO2 23 08/09/2023 1029   CO2 26 08/11/2016 1404   BUN 10 08/09/2023 1029   CREATININE 0.90 08/09/2023 1029   CREATININE 1.02 (H) 03/26/2023 0753   CREATININE 0.87 08/11/2016 1404      Component Value Date/Time   CALCIUM 9.6 08/09/2023 1029   CALCIUM 9.5 08/11/2016 1404   ALKPHOS 75 08/09/2023 1029   ALKPHOS 58 08/11/2016 1404   AST 15 08/09/2023 1029   AST 20 03/26/2023 0753   ALT 9 08/09/2023 1029   ALT 11 03/26/2023 0753   BILITOT 0.3 08/09/2023 1029   BILITOT 0.5 03/26/2023 0753       Impression and Plan: Tamara Ball is a very pleasant 43 yo African American female with iron  deficiency secondary to heavy cycles/uterine fibroids.   Iron  studies are pending. We will replace if needed.  Follow-up in 4 months.   Tamara Pepper, NP 8/1/20251:14 PM

## 2023-12-08 ENCOUNTER — Ambulatory Visit: Payer: Self-pay | Admitting: Hematology & Oncology

## 2023-12-09 ENCOUNTER — Other Ambulatory Visit: Payer: Self-pay | Admitting: Family Medicine

## 2023-12-09 DIAGNOSIS — J310 Chronic rhinitis: Secondary | ICD-10-CM

## 2023-12-09 DIAGNOSIS — J309 Allergic rhinitis, unspecified: Secondary | ICD-10-CM

## 2023-12-09 DIAGNOSIS — L299 Pruritus, unspecified: Secondary | ICD-10-CM

## 2023-12-10 ENCOUNTER — Encounter: Payer: Self-pay | Admitting: *Deleted

## 2023-12-10 ENCOUNTER — Other Ambulatory Visit: Payer: Self-pay | Admitting: Family

## 2023-12-10 NOTE — Telephone Encounter (Signed)
 Requesting rx rf of  montelukast  10mg   Last written as 30 day supply 11/14/23 And  Levocitirizine 5mg   Not in current med list  Shows   The original prescription was discontinued on 11/14/2023 by Vivienne Delon HERO, PA-C. Renewing this prescription may not be appropriate.  Last OV 07/01/2023 Upcoming appt = none

## 2023-12-14 ENCOUNTER — Other Ambulatory Visit: Payer: Self-pay | Admitting: Medical Genetics

## 2023-12-14 NOTE — Progress Notes (Deleted)
  Cardiology Office Note   Date:  12/14/2023  ID:  Kayda, Allers [birth year], MRN [id number] PCP: Colette Torrence GRADE, MD  [facility] Providers Cardiologist:  Darryle ONEIDA Decent, MD    History of Present Illness MANYA BALASH is a 44 y.o. female with a past medical history of  Chest pain Coronary CTA in 04/2023 showed a coronary calcium score of 0, no evidence of CAD Echocardiogram in 05/2023 showed EF 55-60%, no regional wall motion abnormalities, normal RV systolic function, normal PA systolic pressure Palpitations 7-day ZIO in 05/2023 noted frequent PACs with 7.1% burden, occasional PVCs with 1.4% burden. Iron  deficiency anemia, uterine fibroids  Patient previously developed chest pain and palpitations after COVID infection in 2022.  More recently seen by Dr. Decent 04/17/2023 with chest pain and palpitations.  She has no known family history of heart disease.  No tobacco use   Palpitations  PACs, PVCs - Cardiac monitor in 05/2023 noted frequent PACs with 7.1% burden, occasional PVCs with 1.4% burden  Chest pain  - Coronary calcium score of 0 in 04/2023.  Echo in 05/2023 with EF 55 to 60%  ROS: ***  Studies Reviewed      *** Risk Assessment/Calculations {Does this patient have ATRIAL FIBRILLATION?:[id number]} No BP recorded.  {Refresh Note OR Click here to enter BP  :1}***       Physical Exam VS:  There were no vitals taken for this visit.       Wt Readings from Last 3 Encounters:  12/07/23 198 lb 12.8 oz (90.2 kg)  11/29/23 196 lb (88.9 kg)  11/15/23 203 lb (92.1 kg)    GEN: Well nourished, well developed in no acute distress NECK: No JVD; No carotid bruits CARDIAC: ***RRR, no murmurs, rubs, gallops RESPIRATORY:  Clear to auscultation without rales, wheezing or rhonchi  ABDOMEN: Soft, non-tender, non-distended EXTREMITIES:  No edema; No deformity   ASSESSMENT AND PLAN ***    {Are you ordering a CV Procedure (e.g. stress test, cath, DCCV, TEE, etc)?    Press F2        :[id number]}  Dispo: ***  Signed, Rollo FABIENE Louder, PA-C

## 2023-12-17 ENCOUNTER — Emergency Department (HOSPITAL_BASED_OUTPATIENT_CLINIC_OR_DEPARTMENT_OTHER)
Admission: EM | Admit: 2023-12-17 | Discharge: 2023-12-17 | Disposition: A | Discharge status: Home / Self Care | Attending: Emergency Medicine | Admitting: Emergency Medicine

## 2023-12-17 ENCOUNTER — Other Ambulatory Visit: Payer: Self-pay

## 2023-12-17 ENCOUNTER — Encounter (HOSPITAL_BASED_OUTPATIENT_CLINIC_OR_DEPARTMENT_OTHER): Payer: Self-pay | Admitting: Emergency Medicine

## 2023-12-17 ENCOUNTER — Emergency Department (HOSPITAL_BASED_OUTPATIENT_CLINIC_OR_DEPARTMENT_OTHER)

## 2023-12-17 DIAGNOSIS — R0789 Other chest pain: Secondary | ICD-10-CM | POA: Diagnosis present

## 2023-12-17 DIAGNOSIS — D649 Anemia, unspecified: Secondary | ICD-10-CM | POA: Diagnosis not present

## 2023-12-17 LAB — CBC
HCT: 37.3 % (ref 36.0–46.0)
Hemoglobin: 11.5 g/dL — ABNORMAL LOW (ref 12.0–15.0)
MCH: 25.2 pg — ABNORMAL LOW (ref 26.0–34.0)
MCHC: 30.8 g/dL (ref 30.0–36.0)
MCV: 81.6 fL (ref 80.0–100.0)
Platelets: 225 K/uL (ref 150–400)
RBC: 4.57 MIL/uL (ref 3.87–5.11)
RDW: 14 % (ref 11.5–15.5)
WBC: 8 K/uL (ref 4.0–10.5)
nRBC: 0 % (ref 0.0–0.2)

## 2023-12-17 LAB — D-DIMER, QUANTITATIVE: D-Dimer, Quant: <0.27 ug{FEU}/mL (ref 0.00–0.50)

## 2023-12-17 LAB — BASIC METABOLIC PANEL WITH GFR
Anion gap: 12 (ref 5–15)
BUN: 14 mg/dL (ref 6–20)
CO2: 21 mmol/L — ABNORMAL LOW (ref 22–32)
Calcium: 9.6 mg/dL (ref 8.9–10.3)
Chloride: 104 mmol/L (ref 98–111)
Creatinine, Ser: 0.88 mg/dL (ref 0.44–1.00)
GFR, Estimated: >60 mL/min (ref >60)
Glucose, Bld: 95 mg/dL (ref 70–99)
Potassium: 4.2 mmol/L (ref 3.5–5.1)
Sodium: 137 mmol/L (ref 135–145)

## 2023-12-17 LAB — TROPONIN T, HIGH SENSITIVITY: Troponin T High Sensitivity: <15 ng/L (ref <19)

## 2023-12-17 LAB — PREGNANCY, URINE: Preg Test, Ur: NEGATIVE

## 2023-12-17 NOTE — Discharge Instructions (Addendum)
 Your workup today is reassuring. It is very unlikely that your pain is due to an issue in your heart.  Your cardiac enzyme (troponin) was normal today. Your EKG which is a measure of the heart's electrical activity and rhythm is normal today. These would both show abnormalities if you were having a heart attack.  Your blood test to look for signs of a blood clot were normal   Your chest x-ray is normal today.  Your hemoglobin which is one of your blood counts was low here today at 11.5.  Please go to your appointment tomorrow to get your iron  transfusion.  This could be contributing to your feelings of shortness of breath.  Please attend your cardiology appointment on Friday for further workup and evaluation.  Return to the ER if you have any shortness of breath, difficulty breathing, worsening chest pain, dizziness, jaw pain, left arm or shoulder pain, abdominal pain, unexplained fever, any other new or concerning symptoms.

## 2023-12-17 NOTE — ED Provider Notes (Signed)
 [place] [facility] Provider Note   CSN: [id number] Arrival date & time: 12/17/23  1305     Patient presents with: Chest Pain   Tamara Ball is a 43 y.o. female with history of iron  deficiency anemia, chronic chest pains followed by cardiology Dr. Barbaraann, presents with concern for ongoing chest pain and heaviness sensation as well as some lightheadedness for the past 3 days. States that the chest pain is not new for her as this has been chronic and followed by cardiology. However, in the past 3 days, she has felt more lightheaded and short of breath than normal.  Denies any pain with exertion.  Denies any pleuritic chest pain.  No pain that radiates to the back.  Reports she is scheduled to get an iron  infusion tomorrow due to chronic iron  deficiency anemia.  She does report she travels frequently for at work, going on multiple plane and car rides over the past couple of months.  Denies any lower extremity pain or swelling.  She denies any recent surgeries or hospitalizations.  Denies any OCP use.    Chest Pain      Prior to Admission medications   Medication Sig Start Date End Date Taking? Authorizing Provider  azelastine  (ASTELIN ) 0.1 % nasal spray Place 1 spray into both nostrils 2 (two) times daily. Use in each nostril as directed 04/30/23   Gladis Elsie BROCKS, PA-C  b complex vitamins tablet Take 1 tablet by mouth daily.    [provider]  Biotin 89999 MCG TABS Take 1 tablet by mouth daily.    [provider]  cetirizine  (ZYRTEC ) 10 MG tablet Take 1 tablet (10 mg total) by mouth daily. 11/14/23   Vivienne Delon HERO, PA-C  diphenhydrAMINE  HCl (BENADRYL  PO) Take by mouth as needed.    [provider]  fluticasone  (FLONASE ) 50 MCG/ACT nasal spray Place 2 sprays into both nostrils daily. 07/17/22   Kennyth Domino, FNP  ibuprofen  (ADVIL ) 800 MG tablet Take 800 mg by mouth every 6 (six) hours as needed. 11/26/23   [provider]  Insulin  Pen Needle 31G X 5 MM MISC Use daily with Victoza  08/09/23   Tickerhoff, Stephanie, FNP  levocetirizine (XYZAL ) 5 MG tablet TAKE 1 TABLET BY MOUTH ONCE DAILY IN THE EVENING 12/10/23   Rucker, Torrence GRADE, MD  liraglutide  (VICTOZA ) 18 MG/3ML SOPN Inject 1.8 mg into the skin daily. 11/29/23   Becki Krabbe, FNP  metFORMIN  (GLUCOPHAGE ) 500 MG tablet Take 1 tablet (500 mg total) by mouth daily with breakfast. 11/29/23   Becki Krabbe, FNP  montelukast  (SINGULAIR ) 10 MG tablet TAKE 1 TABLET BY MOUTH AT BEDTIME 12/10/23   Colette Torrence GRADE, MD  Multiple Vitamin (MULTIVITAMIN WITH MINERALS) TABS tablet Take 1 tablet by mouth daily.    [provider]  Omega-3 Fatty Acids (FISH OIL) 1000 MG CAPS Take 1 capsule by mouth daily.    [provider]  polyethylene glycol (MIRALAX / GLYCOLAX) 17 g packet Take 17 g by mouth daily as needed.    [provider]  VITAMIN D  PO Take 10,000 Units by mouth once a week. Patient taking differently: Take 1,000 Units by mouth daily.    [provider]    Allergies: Patient has no known allergies.    Review of Systems  Cardiovascular:  Positive for chest pain.    Updated Vital Signs BP 123/80   Pulse 60   Temp 97.7 F (36.5 C) (Oral)  Resp 16   Ht 5' 6 (1.676 m)   Wt 86.2 kg   LMP 11/19/2023 (Approximate)   SpO2 100%   BMI 30.67 kg/m   Physical Exam Vitals and nursing note reviewed.  Constitutional:      General: She is not in acute distress.    Appearance: She is well-developed.  HENT:     Head: Normocephalic and atraumatic.  Eyes:     Conjunctiva/sclera: Conjunctivae normal.  Cardiovascular:     Rate and Rhythm: Normal rate and regular rhythm.     Heart sounds: No murmur heard.    Comments: Radial pulses 2+ bilaterally Pulmonary:     Effort: Pulmonary effort is normal. No respiratory distress.     Breath sounds: Normal breath sounds.  Abdominal:     Palpations: Abdomen is  soft.     Tenderness: There is no abdominal tenderness.  Musculoskeletal:        General: No swelling.     Cervical back: Neck supple.     Comments: Bilateral lower extremities without any edema.  No calf tenderness to palpation bilaterally.  Skin:    General: Skin is warm and dry.     Capillary Refill: Capillary refill takes less than 2 seconds.  Neurological:     Mental Status: She is alert.  Psychiatric:        Mood and Affect: Mood normal.     (all labs ordered are listed, but only abnormal results are displayed) Labs Reviewed  BASIC METABOLIC PANEL WITH GFR - Abnormal; Notable for the following components:      Result Value   CO2 21 (*)    All other components within normal limits  CBC - Abnormal; Notable for the following components:   Hemoglobin 11.5 (*)    MCH 25.2 (*)    All other components within normal limits  PREGNANCY, URINE  D-DIMER, QUANTITATIVE  TROPONIN T, HIGH SENSITIVITY    EKG: EKG Interpretation Date/Time:  Monday December 17 2023 13:18:48 EDT Ventricular Rate:  67 PR Interval:  142 QRS Duration:  81 QT Interval:  365 QTC Calculation: 386 R Axis:   37  Text Interpretation: Sinus rhythm similar to prior, no STEMI Confirmed by Elnor Savant ([id number]) on 12/17/2023 5:29:32 PM  Radiology: DG Chest 2 View Result Date: 12/17/2023 CLINICAL DATA:  Chest pain x3 days with left arm tingling and tightness in the left jaw. EXAM: CHEST - 2 VIEW COMPARISON:  June 07, 2020 FINDINGS: The heart size and mediastinal contours are within normal limits. Both lungs are clear. The visualized skeletal structures are unremarkable. IMPRESSION: No active cardiopulmonary disease. Electronically Signed   By: Suzen Dials M.D.   On: 12/17/2023 14:26     Procedures   Medications Ordered in the ED - No data to display              HEART Score: 1                    Medical Decision Making Amount and/or Complexity of Data Reviewed Labs: ordered. Radiology:  ordered.     Differential diagnosis includes but is not limited to ACS, arrhythmia, aortic aneurysm, pericarditis, myocarditis, pericardial effusion, cardiac tamponade, musculoskeletal pain, GERD, Boerhaave's syndrome, DVT/PE, pneumonia, pleural effusion   ED Course:  Upon initial evaluation, patient is well-appearing, stable vitals.  Regular rate and rhythm on cardiac auscultation.  Lungs clear to auscultation bilaterally.  No lower extremity calf tenderness to palpation.   Labs Ordered: I Ordered,  and personally interpreted labs.  The pertinent results include:   CBC with hemoglobin 11.5.  No leukocytosis BMP within normal limits Pregnancy negative Troponin within normal limits D-dimer within normal limits  Imaging Studies ordered: I ordered imaging studies including chest x-ray I independently visualized the imaging with scope of interpretation limited to determining acute life threatening conditions related to emergency care. Imaging showed no acute abnormalities I agree with the radiologist interpretation   Cardiac Monitoring: / EKG: The patient was maintained on a cardiac monitor.  I personally viewed and interpreted the cardiac monitored which showed an underlying rhythm of: Normal sinus rhythm, no ST elevations    Medications Given: None  Upon re-evaluation, patient remains well-appearing with stable vitals.   Low concern for ACS at this time given troponin within normal limits, chest pain non-exertional and chronic for a couple months without acute worsening, and EKG with normal sinus rhythm and no ST changes. HEART score of 1 due to age. Chest x-ray without any acute abnormality. No concern for DVT or PE at this time given d-dimer within normal limits. Suspect her increased feelings of shortness of breath and lightheadedness could be due to her known iron  deficiency. She is scheduled for iron  infusion tomorrow.  She also states she has a follow-up appointment with her  cardiologist scheduled in 5 days on 12/21/2023.  Feel she is stable and appropriate for discharge with close follow-up with her hematologist and cardiologist.    Impression: Fatigue  Chronic atypical chest pain Anemia  Disposition:  The patient was discharged home with instructions to follow-up with her hematology clinic tomorrow for her iron  infusion.  Follow-up with her cardiologist on 8/15 for further evaluation of her chest pains. Return precautions given.    Record Review: External records from outside source obtained and reviewed including CT coronary calcium score of 0 from 2024.  No evidence of CAD.       This chart was dictated using voice recognition software, Dragon. Despite the best efforts of this provider to proofread and correct errors, errors may still occur which can change documentation meaning.       Final diagnoses:  Atypical chest pain  Low hemoglobin    ED Discharge Orders     None          Veta Palma, PA-C 12/17/23 1736    Elnor Jayson LABOR, DO 12/17/23 1905

## 2023-12-17 NOTE — ED Triage Notes (Signed)
 Pt c/o chest pain x 3 days, tingling in L arm, tightness in L jaw today. Shob today.   Hx of iron  infusions, next one is tomorrow.

## 2023-12-18 ENCOUNTER — Inpatient Hospital Stay

## 2023-12-18 VITALS — BP 106/57 | HR 65 | Resp 18

## 2023-12-18 DIAGNOSIS — D500 Iron deficiency anemia secondary to blood loss (chronic): Secondary | ICD-10-CM

## 2023-12-18 DIAGNOSIS — N920 Excessive and frequent menstruation with regular cycle: Secondary | ICD-10-CM

## 2023-12-18 MED ORDER — SODIUM CHLORIDE 0.9 % IV SOLN: INTRAVENOUS | Status: DC

## 2023-12-18 MED ORDER — SODIUM CHLORIDE 0.9 % IV SOLN
300.0000 mg | Freq: Once | INTRAVENOUS | Status: AC
  Administered 2023-12-18 (×2): 300 mg via INTRAVENOUS
  Filled 2023-12-18: qty 300

## 2023-12-18 NOTE — Patient Instructions (Signed)

## 2023-12-25 ENCOUNTER — Inpatient Hospital Stay

## 2023-12-25 VITALS — BP 106/55 | HR 69 | Resp 18

## 2023-12-25 DIAGNOSIS — D500 Iron deficiency anemia secondary to blood loss (chronic): Secondary | ICD-10-CM

## 2023-12-25 DIAGNOSIS — N920 Excessive and frequent menstruation with regular cycle: Secondary | ICD-10-CM

## 2023-12-25 MED ORDER — IRON SUCROSE 300 MG IVPB - SIMPLE MED
300.0000 mg | Freq: Once | Status: AC
  Administered 2023-12-25: 300 mg via INTRAVENOUS
  Filled 2023-12-25: qty 300

## 2023-12-25 MED ORDER — SODIUM CHLORIDE 0.9 % IV SOLN: Freq: Once | INTRAVENOUS | Status: AC

## 2023-12-25 NOTE — Progress Notes (Signed)
Patient refused to wait 30 minutes post infusion. Released stable and ASX. 

## 2023-12-25 NOTE — Patient Instructions (Signed)

## 2023-12-26 NOTE — Progress Notes (Unsigned)
 Cardiology Office Note:    Date:  12/27/2023   ID:  Tamara Ball, DOB 25-May-1980, MRN [id number]  PCP:  Colette Torrence GRADE, MD   [facility] Providers Cardiologist:  Darryle ONEIDA Decent, MD     Referring MD: Colette Torrence GRADE, MD   Chief complaint: Follow-up ED visit 12/17/2023  History of Present Illness:    Tamara Ball is a 43 y.o. female with a hx of IDA secondary to uterine fibroids, migraines, presenting to the office today following her emergency department visit for atypical chest pain.  Seen by Dr. CLEMENTEEN Betters on 07/01/2020 for atypical chest pain and palpitations following COVID-pneumonia.  EKG at that time showed normal sinus rhythm without ischemic changes or evidence of infarction.  Her ED visit on 06/07/2020 had negative cardiac enzymes, ruling out myocarditis.  Her symptoms had resolved for 2 weeks prior to her cardiology visit, no further testing was pursued.  Seen again by Dr. Vernice in 04/2023 for frequent palpitations during exercise, symptoms have been going on 6-8 months at that time.  Coronary CTA in 04/2023 showed a coronary calcium score of 0, no evidence of CAD.  7-day ZIO monitor in 05/2023 noted frequent PACs with 7.1% burden, occasional PVCs 1.4% burden.  Metoprolol  was recommended by Dr. Vernice at this time, patient did not pick it up.  ED visit on 12/17/2023 patient arrived complaining of chest pain, reported as chronic, described new lightheadedness and shortness of breath.  Negative cardiac enzymes, EKG normal sinus rhythm and no ST changes, chest x-ray negative for acute abnormality, D-dimer within normal limits.  SOB related to IDA, considering iron  was 23, TIBC 531, ferritin 7, patient was scheduled for iron  infusion the following day.  Discharged with follow-up to cardiology and hematology.  [name] is a pleasant woman, presenting to the clinic alone today following an episode of chest pain on 12/17/2023.  Patient follows with hematology for regular iron   infusions, has been told her body does not absorb iron  from her diet, and is further complicated by uterine fibroids that cause excessive bleeding during menstruation, trying to get insurance's approval for Sonata procedure later this year. Describes fatigue prior to iron  infusions, was fatigued the week prior to her ED visit.  Reports historical incidences of chest pain when her iron  gets low in the past.  This visit was more intense, described as a tightness, punched in the chest, left arm pain with tingling, left-sided jaw pain, left sided facial tightness. Went to the ED, hemoglobin 11.5 on arrival with low iron .  Symptoms resolved spontaneously.  Describes herself as a very active person, does not rest frequently.  Only medication change recently was that she was unable to fill her Victoza  due to change in insurance, stopped that 2 months ago, reports increased weight gain she feels may attribute to her symptoms.  Denies current chest pain, shortness of breath, palpitations, nausea, dark or tarry stools, leg swelling, dizziness, near-syncope, history of syncope, orthopnea, cough.  Describes needing an extra iron  infusion based on her previous lab results this month, believes that could attribute to the severity of her symptoms.  Dr. Vernice had prescribed metoprolol  earlier in the year following the 7.1% PAC burden and 1.4% PVC burden observed on her ZIO monitor, patient did not pick up prescription at that time due to fear of consequences of missing doses that she had read about on the Internet of possible heart attack.  We discussed the side effect profile, as well as risks  and benefits of taking this medicine, patient states she is willing to try it for her asymptomatic ectopy and anginal symptoms.   Past Medical History:  Diagnosis Date   Anemia    Anxiety    Chicken pox    Chronic headaches    Constipation    Fibroid    Fibroids    Iron  deficiency anemia due to chronic blood loss    from  heavy periods   Menorrhagia    Palpitations     History reviewed. No pertinent surgical history.  Current Medications: Current Meds  Medication Sig   azelastine  (ASTELIN ) 0.1 % nasal spray Place 1 spray into both nostrils 2 (two) times daily. Use in each nostril as directed   b complex vitamins tablet Take 1 tablet by mouth daily.   Biotin 89999 MCG TABS Take 1 tablet by mouth daily.   cetirizine  (ZYRTEC ) 10 MG tablet Take 1 tablet (10 mg total) by mouth daily. (Patient taking differently: Take 10 mg by mouth as needed.)   diphenhydrAMINE  HCl (BENADRYL  PO) Take by mouth as needed.   fluticasone  (FLONASE ) 50 MCG/ACT nasal spray Place 2 sprays into both nostrils daily.   Insulin  Pen Needle 31G X 5 MM MISC Use daily with Victoza  (Patient taking differently: as needed. Use daily with Victoza )   levocetirizine (XYZAL ) 5 MG tablet TAKE 1 TABLET BY MOUTH ONCE DAILY IN THE EVENING   liraglutide  (VICTOZA ) 18 MG/3ML SOPN Inject 1.8 mg into the skin daily. (Patient taking differently: Inject 1.8 mg into the skin as needed.)   metFORMIN  (GLUCOPHAGE ) 500 MG tablet Take 1 tablet (500 mg total) by mouth daily with breakfast. (Patient taking differently: Take 500 mg by mouth as needed.)   metoprolol  succinate (TOPROL  XL) 25 MG 24 hr tablet Take 1 tablet (25 mg total) by mouth daily.   montelukast  (SINGULAIR ) 10 MG tablet TAKE 1 TABLET BY MOUTH AT BEDTIME   Multiple Vitamin (MULTIVITAMIN WITH MINERALS) TABS tablet Take 1 tablet by mouth daily.   Omega-3 Fatty Acids (FISH OIL) 1000 MG CAPS Take 1 capsule by mouth daily.   polyethylene glycol (MIRALAX / GLYCOLAX) 17 g packet Take 17 g by mouth daily as needed.   VITAMIN D  PO Take 10,000 Units by mouth once a week.     Allergies:   Patient has no known allergies.   Social History   Socioeconomic History   Marital status: Single    Spouse name: Not on file   Number of children: 1   Years of education: Not on file   Highest education level: Master's  degree (e.g., MA, MS, MEng, MEd, MSW, MBA)  Occupational History   Occupation: Public house manager  Tobacco Use   Smoking status: Never   Smokeless tobacco: Never  Vaping Use   Vaping status: Never Used  Substance and Sexual Activity   Alcohol use: Yes    Alcohol/week: 0.0 standard drinks of alcohol    Comment: social   Drug use: No   Sexual activity: Not Currently    Partners: Male    Birth control/protection: None  Other Topics Concern   Not on file  Social History Narrative   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 Situation: lives with 45 yo      Spiritual Beliefs: Christian      Lifestyle: no regular exercise; diet is so so      Social Drivers of Corporate investment banker Strain: Low Risk  (  01/02/2023)   Overall Financial Resource Strain (CARDIA)    Difficulty of Paying Living Expenses: Not hard at all  Food Insecurity: No Food Insecurity (01/02/2023)   Hunger Vital Sign    Worried About Running Out of Food in the Last Year: Never true    Ran Out of Food in the Last Year: Never true  Transportation Needs: No Transportation Needs (01/02/2023)   PRAPARE - Administrator, Civil Service (Medical): No    Lack of Transportation (Non-Medical): No  Physical Activity: Sufficiently Active (01/02/2023)   Exercise Vital Sign    Days of Exercise per Week: 5 days    Minutes of Exercise per Session: 60 min  Stress: No Stress Concern Present (01/02/2023)   [employer] of Occupational Health - Occupational Stress Questionnaire    Feeling of Stress : Not at all  Social Connections: Unknown (01/02/2023)   Social Connection and Isolation Panel    Frequency of Communication with Friends and Family: More than three times a week    Frequency of Social Gatherings with Friends and Family: More than three times a week    Attends Religious Services: Patient declined    Database administrator or Organizations: Yes    Attends Engineer, structural: 1 to  4 times per year    Marital Status: Never married     Family History: The patient's family history includes Cancer in her maternal aunt and mother; Diabetes in her maternal grandfather; Glaucoma in her maternal grandfather; Gout in her maternal grandfather; Healthy in her maternal grandmother; Hyperlipidemia in her maternal grandfather; Hypertension in her maternal grandfather, maternal uncle, and mother; Other in her sister. There is no history of Colon polyps, Colon cancer, or Esophageal cancer.  ROS:   Please see the history of present illness.    All other systems reviewed and are negative.  EKGs/Labs/Other Studies Reviewed:    The following studies were reviewed today:  EKG Interpretation Date/Time:  Thursday December 27 2023 11:14:49 EDT Ventricular Rate:  66 PR Interval:  146 QRS Duration:  74 QT Interval:  372 QTC Calculation: 389 R Axis:   70  Text Interpretation: Sinus rhythm with Premature supraventricular complexes  Unchanged from previous reading Confirmed by Elaine Moloney [phone]) on 12/27/2023 11:20:37 AM    Recent Labs: 08/09/2023: TSH 1.940 12/07/2023: ALT 9 12/17/2023: BUN 14; Creatinine, Ser 0.88; Hemoglobin 11.5; Platelets 225; Potassium 4.2; Sodium 137  Recent Lipid Panel    Component Value Date/Time   CHOL 126 08/09/2023 1029   TRIG 26 08/09/2023 1029   HDL 59 08/09/2023 1029   CHOLHDL 2.2 07/13/2020 1421   CHOLHDL 2 05/27/2018 0848   VLDL 10.4 05/27/2018 0848   LDLCALC 59 08/09/2023 1029     Physical Exam:    VS:  BP 110/80   Pulse 69   Ht 5' 6 (1.676 m)   Wt 205 lb 6.4 oz (93.2 kg)   LMP 11/19/2023 (Approximate)   SpO2 98%   BMI 33.15 kg/m     Wt Readings from Last 3 Encounters:  12/27/23 205 lb 6.4 oz (93.2 kg)  12/17/23 190 lb (86.2 kg)  12/07/23 198 lb 12.8 oz (90.2 kg)     GEN:  Well nourished, well developed in no acute distress HEENT: Normal NECK: No carotid bruits CARDIAC: S1-S2 normal, RRR, no murmurs, rubs, gallops, 2+  radial/PT pulses RESPIRATORY:  Clear to auscultation without rales, wheezing or rhonchi  MUSCULOSKELETAL:  No edema; No deformity  SKIN: Warm  and dry NEUROLOGIC:  Alert and oriented x 3 PSYCHIATRIC:  Normal affect   ASSESSMENT:    1. Chest pain, unspecified type   2. PVC (premature ventricular contraction)    PLAN:    In order of problems listed above:  Chest pain PVCs/PACs -EKG 12/27/2023: Sinus rhythm with PVCs, unchanged from prior readings -Echo 05/22/2023: LVEF 55-60%, no RWMA, normal PASP, normal valvular structure and function -Zio patch 04/22/2023 - 05/03/2023, wear time 7 days and 19 hours, minimum HR 58 BPM, max HR 147 bpm, average HR 82 bpm, predominant underlying rhythm was sinus rhythm.  Frequent PACs (7.1%), occasional PVCs (1.4%). -Coronary CTA 05/03/2023: Coronary calcium score 0, normal coronary origin with left dominance, no evidence of CAD. -Patient reports historical correlation between timing of iron  infusions and onset of chest pain symptoms in the past. - Suspect a supply/demand mismatch due to IDA causing ischemic-like symptoms without ST abnormalities. - Will hopefully see improvement in symptoms following Sonata procedure for uterine fibroids to reduce excessive bleeding with menstruation. - Will try metoprolol  succinate 25 mg p.o. daily to reduce ectopy burden and anginal symptoms.  Follow-up with Dr. Vernice or Rollo Louder PA-C in 2 months       Medication Adjustments/Labs and Tests Ordered: Current medicines are reviewed at length with the patient today.  Concerns regarding medicines are outlined above.  Orders Placed This Encounter  Procedures   EKG 12-Lead   Meds ordered this encounter  Medications   metoprolol  succinate (TOPROL  XL) 25 MG 24 hr tablet    Sig: Take 1 tablet (25 mg total) by mouth daily.    Dispense:  90 tablet    Refill:  3    Patient Instructions  Medication Instructions:  Start metoprolol  succinate ER 25 mg once a day   *If you need a refill on your cardiac medications before your next appointment, please call your pharmacy*  Lab Work: No labs  Testing/Procedures: No testing  Follow-Up: At [facility], you and your health needs are our priority.  As part of our continuing mission to provide you with exceptional heart care, our providers are all part of one team.  This team includes your primary Cardiologist (physician) and Advanced Practice Providers or APPs (Physician Assistants and Nurse Practitioners) who all work together to provide you with the care you need, when you need it.  Your next appointment:   2 month(s)  Provider:   Darryle ONEIDA Decent, MD or Aline Door, PA-C, Kathleen Johnson, PA-C, Hao Meng, PA-C, or Katlyn West, NP.  We recommend signing up for the patient portal called MyChart.  Sign up information is provided on this After Visit Summary.  MyChart is used to connect with patients for Virtual Visits (Telemedicine).  Patients are able to view lab/test results, encounter notes, upcoming appointments, etc.  Non-urgent messages can be sent to your provider as well.   To learn more about what you can do with MyChart, go to [email or web address].    Signed, Miriam FORBES Shams, NP  12/27/2023 4:49 PM    [facility]

## 2023-12-27 ENCOUNTER — Encounter: Payer: Self-pay | Admitting: Cardiology

## 2023-12-27 ENCOUNTER — Ambulatory Visit: Attending: Cardiology | Admitting: Emergency Medicine

## 2023-12-27 VITALS — BP 110/80 | HR 69 | Ht 66.0 in | Wt 205.4 lb

## 2023-12-27 DIAGNOSIS — I493 Ventricular premature depolarization: Secondary | ICD-10-CM

## 2023-12-27 DIAGNOSIS — R079 Chest pain, unspecified: Secondary | ICD-10-CM | POA: Diagnosis not present

## 2023-12-27 MED ORDER — METOPROLOL SUCCINATE ER 25 MG PO TB24: 25.0000 mg | ORAL_TABLET | Freq: Every day | ORAL | 3 refills | Status: AC

## 2023-12-27 NOTE — Patient Instructions (Signed)
 Medication Instructions:  Start metoprolol  succinate ER 25 mg once a day  *If you need a refill on your cardiac medications before your next appointment, please call your pharmacy*  Lab Work: No labs  Testing/Procedures: No testing  Follow-Up: At [facility], you and your health needs are our priority.  As part of our continuing mission to provide you with exceptional heart care, our providers are all part of one team.  This team includes your primary Cardiologist (physician) and Advanced Practice Providers or APPs (Physician Assistants and Nurse Practitioners) who all work together to provide you with the care you need, when you need it.  Your next appointment:   2 month(s)  Provider:   Darryle ONEIDA Decent, MD or Aline Door, PA-C, Kathleen Johnson, PA-C, Hao Meng, PA-C, or Katlyn West, NP.  We recommend signing up for the patient portal called MyChart.  Sign up information is provided on this After Visit Summary.  MyChart is used to connect with patients for Virtual Visits (Telemedicine).  Patients are able to view lab/test results, encounter notes, upcoming appointments, etc.  Non-urgent messages can be sent to your provider as well.   To learn more about what you can do with MyChart, go to [email or web address].

## 2024-01-01 ENCOUNTER — Inpatient Hospital Stay

## 2024-01-01 VITALS — BP 107/56 | HR 66 | Temp 98.5°F | Resp 20

## 2024-01-01 DIAGNOSIS — N920 Excessive and frequent menstruation with regular cycle: Secondary | ICD-10-CM

## 2024-01-01 DIAGNOSIS — D500 Iron deficiency anemia secondary to blood loss (chronic): Secondary | ICD-10-CM

## 2024-01-01 MED ORDER — SODIUM CHLORIDE 0.9 % IV SOLN: INTRAVENOUS | Status: DC

## 2024-01-01 MED ORDER — IRON SUCROSE 300 MG IVPB - SIMPLE MED
300.0000 mg | Freq: Once | Status: AC
  Administered 2024-01-01: 300 mg via INTRAVENOUS
  Filled 2024-01-01: qty 300

## 2024-01-01 NOTE — Progress Notes (Signed)
 Pt declined waiting 30 minutes post iron  infusion. Pt states she feels fine.

## 2024-01-01 NOTE — Patient Instructions (Signed)

## 2024-01-10 ENCOUNTER — Ambulatory Visit: Admitting: Nurse Practitioner

## 2024-01-10 ENCOUNTER — Encounter: Payer: Self-pay | Admitting: Nurse Practitioner

## 2024-01-10 VITALS — BP 109/71 | HR 58 | Temp 98.1°F | Ht 66.0 in | Wt 200.0 lb

## 2024-01-10 DIAGNOSIS — E66811 Obesity, class 1: Secondary | ICD-10-CM

## 2024-01-10 DIAGNOSIS — Z6832 Body mass index (BMI) 32.0-32.9, adult: Secondary | ICD-10-CM | POA: Diagnosis not present

## 2024-01-10 DIAGNOSIS — E88819 Insulin resistance, unspecified: Secondary | ICD-10-CM

## 2024-01-10 NOTE — Progress Notes (Signed)
 Office: [phone]  /  Fax: [phone]  WEIGHT SUMMARY AND BIOMETRICS  Weight Lost Since Last Visit: 0lb  Weight Gained Since Last Visit: 4lb   Vitals Temp: 98.1 F (36.7 C) BP: 109/71 Pulse Rate: (!) 58 SpO2: 100 %   Anthropometric Measurements Height: 5' 6 (1.676 m) Weight: 200 lb (90.7 kg) BMI (Calculated): 32.3 Weight at Last Visit: 196lb Weight Lost Since Last Visit: 0lb Weight Gained Since Last Visit: 4lb Starting Weight: 205lb Total Weight Loss (lbs): 5 lb (2.268 kg)   Body Composition  Body Fat %: 38.8 % Fat Mass (lbs): 78 lbs Muscle Mass (lbs): 116.6 lbs Total Body Water (lbs): 80.8 lbs Visceral Fat Rating : 9   Other Clinical Data Fasting: No Labs: No Today's Visit #: 54 Starting Date: 06/13/18     HPI  Chief Complaint: OBESITY  Tamara Ball is here to discuss her progress with her obesity treatment plan. She is on the the Category 3 Plan and states she is following her eating plan approximately 50 % of the time. She states she is exercising 60 minutes 3 days per week.   Interval History:  Since last office visit she has gained 4 pounds.  She travels frequently for work and eats out when traveling.  Struggles with meal prepping or following the meal plan when she is traveling.  She will be traveling weekly until November and then will start traveling again in January.  She has been going to the gym consistently for the past couple weeks.     Pharmacotherapy for weight loss: She is not currently taking medications  for medical weight loss.  She took Victoza  last 2 months ago due to cost.  Previous pharmacotherapy for medical weight loss:   -Ozempic , Saxenda , Victoza  and Rybelsus .  Felt that Ozempic  worked the best for her but is unable to take due to cost.     Bariatric surgery:  Patient has not had bariatric surgery    Insulin  Resistance Last fasting insulin  was 21.6. A1c was 5.3. Medication(s): None Has taken Ozempic , Victoza  and  Metformin  in the past  Lab Results  Component Value Date   HGBA1C 5.3 08/09/2023   HGBA1C 5.3 04/04/2023   HGBA1C 5.4 07/19/2022   HGBA1C 5.2 01/05/2022   HGBA1C 5.0 07/13/2020   Lab Results  Component Value Date   INSULIN  21.6 08/09/2023   INSULIN  10.6 04/04/2023   INSULIN  10.2 07/19/2022   INSULIN  6.0 01/05/2022   INSULIN  7.6 07/13/2020     PHYSICAL EXAM:  Blood pressure 109/71, pulse (!) 58, temperature 98.1 F (36.7 C), height 5' 6 (1.676 m), weight 200 lb (90.7 kg), last menstrual period 12/31/2023, SpO2 100%. Body mass index is 32.28 kg/m.  General: She is overweight, cooperative, alert, well developed, and in no acute distress. PSYCH: Has normal mood, affect and thought process.   Extremities: No edema.  Neurologic: No gross sensory or motor deficits. No tremors or fasciculations noted.    DIAGNOSTIC DATA REVIEWED:  BMET    Component Value Date/Time   NA 137 12/17/2023 1314   NA 141 08/09/2023 1029   K 4.2 12/17/2023 1314   K 4.2 08/11/2016 1404   CL 104 12/17/2023 1314   CL 103 08/11/2016 1404   CO2 21 (L) 12/17/2023 1314   CO2 26 08/11/2016 1404   GLUCOSE 95 12/17/2023 1314   BUN 14 12/17/2023 1314   BUN 10 08/09/2023 1029   CREATININE 0.88 12/17/2023 1314   CREATININE 0.98 12/07/2023 1258   CREATININE  0.87 08/11/2016 1404   CALCIUM 9.6 12/17/2023 1314   CALCIUM 9.5 08/11/2016 1404   GFRNONAA >60 12/17/2023 1314   GFRNONAA >60 12/07/2023 1258   GFRAA 83 01/27/2020 0844   Lab Results  Component Value Date   HGBA1C 5.3 08/09/2023   HGBA1C 5.0 04/07/2015   Lab Results  Component Value Date   INSULIN  21.6 08/09/2023   INSULIN  8.9 06/13/2018   Lab Results  Component Value Date   TSH 1.940 08/09/2023   CBC    Component Value Date/Time   WBC 8.0 12/17/2023 1314   RBC 4.57 12/17/2023 1314   HGB 11.5 (L) 12/17/2023 1314   HGB 11.7 (L) 12/07/2023 1258   HGB 11.4 08/09/2023 1029   HGB 14.2 04/20/2017 1524   HCT 37.3 12/17/2023 1314   HCT  37.0 08/09/2023 1029   HCT 43.2 04/20/2017 1524   PLT 225 12/17/2023 1314   PLT 252 12/07/2023 1258   PLT 261 08/09/2023 1029   MCV 81.6 12/17/2023 1314   MCV 84 08/09/2023 1029   MCV 90 04/20/2017 1524   MCH 25.2 (L) 12/17/2023 1314   MCHC 30.8 12/17/2023 1314   RDW 14.0 12/17/2023 1314   RDW 13.3 08/09/2023 1029   RDW 13.5 04/20/2017 1524   Iron  Studies    Component Value Date/Time   IRON  23 (L) 12/07/2023 1258   IRON  24 (L) 08/09/2023 1029   IRON  103 04/20/2017 1524   TIBC 531 (H) 12/07/2023 1258   TIBC 450 (H) 04/20/2017 1524   FERRITIN 7 (L) 12/07/2023 1258   FERRITIN 8 (L) 08/09/2023 1029   FERRITIN 23 04/20/2017 1524   IRONPCTSAT 4 (L) 12/07/2023 1258   IRONPCTSAT 23 04/20/2017 1524   IRONPCTSAT 18 (L) 10/30/2014 1213   Lipid Panel     Component Value Date/Time   CHOL 126 08/09/2023 1029   TRIG 26 08/09/2023 1029   HDL 59 08/09/2023 1029   CHOLHDL 2.2 07/13/2020 1421   CHOLHDL 2 05/27/2018 0848   VLDL 10.4 05/27/2018 0848   LDLCALC 59 08/09/2023 1029   Hepatic Function Panel     Component Value Date/Time   PROT 7.4 12/07/2023 1258   PROT 6.9 08/09/2023 1029   ALBUMIN 4.4 12/07/2023 1258   ALBUMIN 4.1 08/09/2023 1029   ALBUMIN 4.2 08/11/2016 1404   AST 16 12/07/2023 1258   ALT 9 12/07/2023 1258   ALKPHOS 67 12/07/2023 1258   ALKPHOS 58 08/11/2016 1404   BILITOT 0.4 12/07/2023 1258   BILIDIR 0.0 06/24/2012 1055      Component Value Date/Time   TSH 1.940 08/09/2023 1029   Nutritional Lab Results  Component Value Date   VD25OH 51.3 07/19/2022   VD25OH 37.3 01/05/2022   VD25OH 45.01 02/24/2021     ASSESSMENT AND PLAN  TREATMENT PLAN FOR OBESITY:  Recommended Dietary Goals  Tamara Ball is currently in the action stage of change. As such, her goal is to continue weight management plan. She has agreed to practicing portion control and making smarter food choices, such as increasing vegetables and decreasing simple carbohydrates.  Information  given on Cat 3 meal plan, dining out guide, cat 3 lunch and breakfast ideas and low carb plan.  Patient eats out frequently due to traveling for work.  Because of this, it's going to be hard for her to lose weight.  Aim to eat protein and vegetables, limit carbs.  Discussed ways of meal planning while traveling such as eating healthy snacks.    Behavioral Intervention  We discussed  the following Behavioral Modification Strategies today: increasing lean protein intake to established goals, decreasing simple carbohydrates , increasing vegetables, increasing fiber rich foods, increasing water intake , reading food labels , continue to practice mindfulness when eating, planning for success, staying on track while traveling and vacationing, continue to work on maintaining a reduced calorie state, getting the recommended amount of protein, incorporating whole foods, making healthy choices, staying well hydrated and practicing mindfulness when eating., and increase protein intake, fibrous foods (25 grams per day for women, 30 grams for men) and water to improve satiety and decrease hunger signals. .  Additional resources provided today: NA  Recommended Physical Activity Goals  Eshaal has been advised to work up to 150 minutes of moderate intensity aerobic activity a week and strengthening exercises 2-3 times per week for cardiovascular health, weight loss maintenance and preservation of muscle mass.   She has agreed to Think about enjoyable ways to increase daily physical activity and overcoming barriers to exercise, Increase physical activity in their day and reduce sedentary time (increase NEAT)., Continue to gradually increase the amount and intensity of exercise routine, and Combine aerobic and strengthening exercises for efficiency and improved cardiometabolic health.   Pharmacotherapy Unable to start GLP 1s due to cost  Avoid Phentermine or Qsymia due to palpitations.  She saw cardiology last on  12/27/23 and is taking Toprol  XL 25mg .     ASSOCIATED CONDITIONS ADDRESSED TODAY  Action/Plan  Insulin  resistance Patient can restart Metformin  daily.  Not currently taking.  She can't afford to take a GLP 1 Will continue to monitor.    Class 1 obesity due to excess calories without serious comorbidity with body mass index (BMI) of 32.0 to 32.9 in adult         Return in about 6 weeks (around 02/21/2024).SABRA She was informed of the importance of frequent follow up visits to maximize her success with intensive lifestyle modifications for her multiple health conditions.   ATTESTASTION STATEMENTS:  Reviewed by clinician on day of visit: allergies, medications, problem list, medical history, surgical history, family history, social history, and previous encounter notes.   I personally spent a total of 30 minutes in the care of the patient today including preparing to see the patient, getting/reviewing separately obtained history, performing a medically appropriate exam/evaluation, counseling and educating, and documenting clinical information in the EHR.    Corean SAUNDERS. Kewana Sanon FNP-C

## 2024-01-16 ENCOUNTER — Encounter

## 2024-01-17 ENCOUNTER — Telehealth: Admitting: Nurse Practitioner

## 2024-01-17 DIAGNOSIS — B351 Tinea unguium: Secondary | ICD-10-CM

## 2024-01-17 MED ORDER — CICLOPIROX 8 % EX SOLN: Freq: Every day | CUTANEOUS | 0 refills | Status: AC

## 2024-01-17 NOTE — Patient Instructions (Signed)
 Tamara Ball, thank you for joining [name], NP for today's virtual visit.  While this provider is not your primary care provider (PCP), if your PCP is located in our provider database this encounter information will be shared with them immediately following your visit.   A [facility] MyChart account gives you access to today's visit and all your visits, tests, and labs performed at [facility]  click here if you don't have a [facility] MyChart account or go to [email or web address]  Consent: (Patient) Tamara Ball provided verbal consent for this virtual visit at the beginning of the encounter.  Current Medications:  Current Outpatient Medications:    ciclopirox  (PENLAC ) 8 % solution, Apply topically at bedtime. Apply over nail and surrounding skin. Apply daily over previous coat. After seven (7) days, may remove with alcohol and continue cycle., Disp: 6.6 mL, Rfl: 0   azelastine  (ASTELIN ) 0.1 % nasal spray, Place 1 spray into both nostrils 2 (two) times daily. Use in each nostril as directed, Disp: 30 mL, Rfl: 0   b complex vitamins tablet, Take 1 tablet by mouth daily., Disp: , Rfl:    Biotin 89999 MCG TABS, Take 1 tablet by mouth daily., Disp: , Rfl:    cetirizine  (ZYRTEC ) 10 MG tablet, Take 1 tablet (10 mg total) by mouth daily. (Patient taking differently: Take 10 mg by mouth as needed.), Disp: 30 tablet, Rfl: 0   diphenhydrAMINE  HCl (BENADRYL  PO), Take by mouth as needed., Disp: , Rfl:    fluticasone  (FLONASE ) 50 MCG/ACT nasal spray, Place 2 sprays into both nostrils daily., Disp: 16 g, Rfl: 6   Insulin  Pen Needle 31G X 5 MM MISC, Use daily with Victoza  (Patient taking differently: as needed. Use daily with Victoza ), Disp: 50 each, Rfl: 6   levocetirizine (XYZAL ) 5 MG tablet, TAKE 1 TABLET BY MOUTH ONCE DAILY IN THE EVENING, Disp: 90 tablet, Rfl: 0   liraglutide  (VICTOZA ) 18 MG/3ML SOPN, Inject 1.8 mg into the skin daily. (Patient taking differently: Inject  1.8 mg into the skin as needed.), Disp: 9 mL, Rfl: 1   metFORMIN  (GLUCOPHAGE ) 500 MG tablet, Take 1 tablet (500 mg total) by mouth daily with breakfast. (Patient taking differently: Take 500 mg by mouth as needed.), Disp: 30 tablet, Rfl: 0   metoprolol  succinate (TOPROL  XL) 25 MG 24 hr tablet, Take 1 tablet (25 mg total) by mouth daily., Disp: 90 tablet, Rfl: 3   montelukast  (SINGULAIR ) 10 MG tablet, TAKE 1 TABLET BY MOUTH AT BEDTIME, Disp: 90 tablet, Rfl: 0   Multiple Vitamin (MULTIVITAMIN WITH MINERALS) TABS tablet, Take 1 tablet by mouth daily., Disp: , Rfl:    Omega-3 Fatty Acids (FISH OIL) 1000 MG CAPS, Take 1 capsule by mouth daily., Disp: , Rfl:    polyethylene glycol (MIRALAX / GLYCOLAX) 17 g packet, Take 17 g by mouth daily as needed., Disp: , Rfl:    VITAMIN D  PO, Take 10,000 Units by mouth once a week., Disp: , Rfl:    Medications ordered in this encounter:  Meds ordered this encounter  Medications   ciclopirox  (PENLAC ) 8 % solution    Sig: Apply topically at bedtime. Apply over nail and surrounding skin. Apply daily over previous coat. After seven (7) days, may remove with alcohol and continue cycle.    Dispense:  6.6 mL    Refill:  0    Supervising Provider:   BLAISE ALEENE KIDD [id number]     *If you need refills on other  medications prior to your next appointment, please contact your pharmacy*  Follow-Up: Call back or seek an in-person evaluation if the symptoms worsen or if the condition fails to improve as anticipated.  [facility] [phone]  Other Instructions Follow up with PCP. We do not prescribe lamisil   If you have been instructed to have an in-person evaluation today at a local Urgent Care facility, please use the link below. It will take you to a list of all of our available [facility], including address, phone number and hours of operation. Please do not delay care.  [facility]  If you or a family member do not  have a primary care provider, use the link below to schedule a visit and establish care. When you choose a [facility] primary care physician or advanced practice provider, you gain a long-term partner in health. Find a Primary Care Provider  Learn more about 's in-office and virtual care options:  - Get Care Now

## 2024-01-17 NOTE — Progress Notes (Signed)
 Virtual Visit Consent   Tamara Ball, you are scheduled for a virtual visit with a [facility] provider today. Just as with appointments in the office, your consent must be obtained to participate. Your consent will be active for this visit and any virtual visit you may have with one of our providers in the next 365 days. If you have a MyChart account, a copy of this consent can be sent to you electronically.  As this is a virtual visit, video technology does not allow for your provider to perform a traditional examination. This may limit your provider's ability to fully assess your condition. If your provider identifies any concerns that need to be evaluated in person or the need to arrange testing (such as labs, EKG, etc.), we will make arrangements to do so. Although advances in technology are sophisticated, we cannot ensure that it will always work on either your end or our end. If the connection with a video visit is poor, the visit may have to be switched to a telephone visit. With either a video or telephone visit, we are not always able to ensure that we have a secure connection.  By engaging in this virtual visit, you consent to the provision of healthcare and authorize for your insurance to be billed (if applicable) for the services provided during this visit. Depending on your insurance coverage, you may receive a charge related to this service.  I need to obtain your verbal consent now. Are you willing to proceed with your visit today? Tamara Ball has provided verbal consent on 01/17/2024 for a virtual visit (video or telephone). Tamara LELON Servant, NP  Date: 01/17/2024 9:07 AM   Virtual Visit via Video Note   I, Tamara Ball, connected with  Tamara Ball  ([id number], 05-12-1980) on 01/17/24 at  9:00 AM EDT by a video-enabled telemedicine application and verified that I am speaking with the correct person using two identifiers.  Location: Patient: Virtual Visit Location Patient:  Home Provider: Virtual Visit Location Provider: Home Office   I discussed the limitations of evaluation and management by telemedicine and the availability of in person appointments. The patient expressed understanding and agreed to proceed.    History of Present Illness: Tamara Ball is a 43 y.o. who identifies as a female who was assigned female at birth, and is being seen today for onychomycosis.   Tamara Ball has greenish yellow stain under the great toenail. She has been getting gel polish pedicures and noticed the fungus initially over a month ago. The discoloring under the toenail has been worsening.    Problems:  Patient Active Problem List   Diagnosis Date Noted   Impaired fasting glucose 09/29/2021   Genetic testing 07/29/2021   Pneumonia due to COVID-19 virus 06/02/2020   Cough 12/02/2019   Chest congestion 12/02/2019   Migraine 09/19/2019   Generalized obesity 06/17/2018   Menorrhagia    Iron  deficiency anemia due to chronic blood loss    Allergic rhinitis 02/05/2014    Allergies: No Known Allergies Medications:  Current Outpatient Medications:    ciclopirox  (PENLAC ) 8 % solution, Apply topically at bedtime. Apply over nail and surrounding skin. Apply daily over previous coat. After seven (7) days, may remove with alcohol and continue cycle., Disp: 6.6 mL, Rfl: 0   azelastine  (ASTELIN ) 0.1 % nasal spray, Place 1 spray into both nostrils 2 (two) times daily. Use in each nostril as directed, Disp: 30 mL, Rfl: 0   b  complex vitamins tablet, Take 1 tablet by mouth daily., Disp: , Rfl:    Biotin 89999 MCG TABS, Take 1 tablet by mouth daily., Disp: , Rfl:    cetirizine  (ZYRTEC ) 10 MG tablet, Take 1 tablet (10 mg total) by mouth daily. (Patient taking differently: Take 10 mg by mouth as needed.), Disp: 30 tablet, Rfl: 0   diphenhydrAMINE  HCl (BENADRYL  PO), Take by mouth as needed., Disp: , Rfl:    fluticasone  (FLONASE ) 50 MCG/ACT nasal spray, Place 2 sprays into both nostrils  daily., Disp: 16 g, Rfl: 6   Insulin  Pen Needle 31G X 5 MM MISC, Use daily with Victoza  (Patient taking differently: as needed. Use daily with Victoza ), Disp: 50 each, Rfl: 6   levocetirizine (XYZAL ) 5 MG tablet, TAKE 1 TABLET BY MOUTH ONCE DAILY IN THE EVENING, Disp: 90 tablet, Rfl: 0   liraglutide  (VICTOZA ) 18 MG/3ML SOPN, Inject 1.8 mg into the skin daily. (Patient taking differently: Inject 1.8 mg into the skin as needed.), Disp: 9 mL, Rfl: 1   metFORMIN  (GLUCOPHAGE ) 500 MG tablet, Take 1 tablet (500 mg total) by mouth daily with breakfast. (Patient taking differently: Take 500 mg by mouth as needed.), Disp: 30 tablet, Rfl: 0   metoprolol  succinate (TOPROL  XL) 25 MG 24 hr tablet, Take 1 tablet (25 mg total) by mouth daily., Disp: 90 tablet, Rfl: 3   montelukast  (SINGULAIR ) 10 MG tablet, TAKE 1 TABLET BY MOUTH AT BEDTIME, Disp: 90 tablet, Rfl: 0   Multiple Vitamin (MULTIVITAMIN WITH MINERALS) TABS tablet, Take 1 tablet by mouth daily., Disp: , Rfl:    Omega-3 Fatty Acids (FISH OIL) 1000 MG CAPS, Take 1 capsule by mouth daily., Disp: , Rfl:    polyethylene glycol (MIRALAX / GLYCOLAX) 17 g packet, Take 17 g by mouth daily as needed., Disp: , Rfl:    VITAMIN D  PO, Take 10,000 Units by mouth once a week., Disp: , Rfl:   Observations/Objective: Patient is well-developed, well-nourished in no acute distress.  Resting comfortably at home.  Head is normocephalic, atraumatic.  No labored breathing.  Speech is clear and coherent with logical content.  Patient is alert and oriented at baseline.    Assessment and Plan: 1. Onychomycosis (Primary) - ciclopirox  (PENLAC ) 8 % solution; Apply topically at bedtime. Apply over nail and surrounding skin. Apply daily over previous coat. After seven (7) days, may remove with alcohol and continue cycle.  Dispense: 6.6 mL; Refill: 0  Follow up with PCP. We do not prescribe lamisil  Follow Up Instructions: I discussed the assessment and treatment plan with the  patient. The patient was provided an opportunity to ask questions and all were answered. The patient agreed with the plan and demonstrated an understanding of the instructions.  A copy of instructions were sent to the patient via MyChart unless otherwise noted below.    The patient was advised to call back or seek an in-person evaluation if the symptoms worsen or if the condition fails to improve as anticipated.    Ashaya Raftery W Kooper Godshall, NP

## 2024-01-18 ENCOUNTER — Other Ambulatory Visit: Payer: Self-pay

## 2024-01-24 ENCOUNTER — Other Ambulatory Visit (HOSPITAL_COMMUNITY)

## 2024-02-01 ENCOUNTER — Encounter: Payer: Self-pay | Admitting: Nurse Practitioner

## 2024-02-04 ENCOUNTER — Telehealth (INDEPENDENT_AMBULATORY_CARE_PROVIDER_SITE_OTHER): Payer: Self-pay

## 2024-02-04 ENCOUNTER — Ambulatory Visit (INDEPENDENT_AMBULATORY_CARE_PROVIDER_SITE_OTHER): Admitting: Otolaryngology

## 2024-02-04 ENCOUNTER — Encounter (INDEPENDENT_AMBULATORY_CARE_PROVIDER_SITE_OTHER): Payer: Self-pay | Admitting: Otolaryngology

## 2024-02-04 ENCOUNTER — Other Ambulatory Visit: Payer: Self-pay

## 2024-02-04 ENCOUNTER — Other Ambulatory Visit: Payer: Self-pay | Admitting: Nurse Practitioner

## 2024-02-04 VITALS — BP 113/74 | HR 65 | Ht 66.0 in | Wt 200.0 lb

## 2024-02-04 DIAGNOSIS — J3489 Other specified disorders of nose and nasal sinuses: Secondary | ICD-10-CM | POA: Diagnosis not present

## 2024-02-04 DIAGNOSIS — J342 Deviated nasal septum: Secondary | ICD-10-CM

## 2024-02-04 DIAGNOSIS — R632 Polyphagia: Secondary | ICD-10-CM

## 2024-02-04 DIAGNOSIS — J309 Allergic rhinitis, unspecified: Secondary | ICD-10-CM | POA: Diagnosis not present

## 2024-02-04 DIAGNOSIS — E66811 Obesity, class 1: Secondary | ICD-10-CM

## 2024-02-04 DIAGNOSIS — J310 Chronic rhinitis: Secondary | ICD-10-CM

## 2024-02-04 DIAGNOSIS — J343 Hypertrophy of nasal turbinates: Secondary | ICD-10-CM

## 2024-02-04 DIAGNOSIS — R0981 Nasal congestion: Secondary | ICD-10-CM | POA: Diagnosis not present

## 2024-02-04 MED ORDER — LIRAGLUTIDE 18 MG/3ML ~~LOC~~ SOPN: 1.8000 mg | PEN_INJECTOR | Freq: Every day | SUBCUTANEOUS | 1 refills | Status: DC

## 2024-02-04 MED ORDER — AZELASTINE HCL 0.1 % NA SOLN: 2.0000 | Freq: Two times a day (BID) | NASAL | 12 refills | Status: AC | PRN

## 2024-02-04 NOTE — Patient Instructions (Addendum)
 Use two sprays of flonase  in each nostril daily, use astelin  spray two sprays each nostril twice per day as needed   [facility] Med Nasal Saline Rinse - use daily - start nasal saline rinses with [facility]Med Bottle available over the counter    Nasal Saline Irrigation instructions: If you choose to make your own salt water solution, You will need: Salt (kosher, canning, or pickling salt) Baking soda Nasal irrigation bottle (i.e. Aureliano Med Sinus Rinse) Measuring spoon ( teaspoon) Distilled / boiled water   Mix solution Mix 1 teaspoon of salt, 1/2 teaspoon of baking soda and 1 cup of water into irrigation bottle ** May use saline packet instead of homemade recipe for this step if you prefer If medicine was prescribed to be mixed with solution, place this into bottle Examples 2 inches of 2% mupirocin ointment Budesonide solution Position your head: Lean over sink (about 45 degrees) Rotate head (about 45 degrees) so that one nostril is above the other Irrigate Insert tip of irrigation bottle into upper nostril so it forms a comfortable seal Irrigate while breathing through your mouth May remove the straw from the bottle in order to irrigate the entire solution (important if medicine was added) Exhale through nose when finished and blow nose as necessary  Repeat on opposite side with other 1/2 of solution (120 mL) or remake solution if all 240 mL was used on first side Wash irrigation bottle regularly, replace every 3 months

## 2024-02-04 NOTE — Telephone Encounter (Signed)
 [employer] has not yet replied to your PA request. Depending on the information you've provided, additional questions may be returned by the plan. You may close this dialog, return to your dashboard, and perform other tasks.  To check for an update later, open this request again from your dashboard.  If [employer] has not replied to your request within 24 hours please contact [employer] at [phone].

## 2024-02-04 NOTE — Telephone Encounter (Signed)
 Prior auth for Victoza  has been sent to insurance via cover my meds.  Awaiting determination.

## 2024-02-04 NOTE — Progress Notes (Signed)
 Dear Dr. Lorin, Here is my assessment for our mutual patient, Tamara Ball. Thank you for allowing me the opportunity to care for your patient. Please do not hesitate to contact me should you have any other questions. Sincerely, Dr. Eldora Blanch  Otolaryngology Clinic Note  HISTORY: Tamara Ball is a 43 y.o. female kindly referred by Dr. Lorin for evaluation of non allergic rhinitis and nasal congestion  Initial visit (01/2024):  She reports that she has a history of non-allergic rhinitis and is on PO anthistamine (Levocetirizine) and montelukast . She has benefit from that but has to take a PO anthistamine mid-day as well (some days) - claritin . If she does not do it, she feels quite congested. Reports that she has bilateral nasal congestion. She has also been using flonase  regularly and that also helps. Interestingly, she does have itchy eyes and nose, sneezing.   She reports that she has not had a sinus infection in a long time. No facial pressure/pain, discolored drainage from the nose, change in sense of smell. Not needing antibiotics or steroids.   She is not using rinses. She has not tried astelin .    Allergy testing has been done. No previous sinonasal surgery.  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 Septum sinuous Dr. Lorin (11/15/2023): noted nasal congestion and sinus sx - recurrence infections for over 9 years, worse after moving; 2-4 times/year, congestion without significant drainage; Allergy testing negative; Dx: CRS, Septal deviation; Rx: Flonase , sinus rinse BMP 12/17/2023: BUN/Cr 14/0.88; CBC w/diff 12/17/2023: WBC 6.8,  Eos 300 IgE and RAST 11/2023: IgE 66, RAST neg Past Medical History:  Diagnosis Date   Anemia    Anxiety    Chicken pox    Chronic headaches    Constipation    Fibroid    Fibroids    Iron  deficiency anemia due to chronic blood loss    from heavy periods   Menorrhagia    Palpitations    History reviewed. No pertinent surgical history. Family History  Problem Relation Age of Onset   Hypertension Mother    Cancer Mother        SCC (x2) dx. 64 and again 6 months later   Other Sister        tumors- unknown type   Cancer Maternal Aunt        colorectal cancer   Hypertension Maternal Uncle    Healthy Maternal Grandmother    Diabetes Maternal Grandfather    Glaucoma Maternal Grandfather    Hypertension Maternal Grandfather    Hyperlipidemia Maternal Grandfather    Gout Maternal Grandfather    Colon polyps Neg Hx    Colon cancer Neg Hx    Esophageal cancer Neg Hx    Social History   Tobacco Use   Smoking status: Never   Smokeless tobacco: Never  Substance Use Topics   Alcohol use: Yes    Alcohol/week: 0.0 standard drinks of alcohol    Comment: social   No Known Allergies Current Outpatient Medications  Medication Sig Dispense Refill   azelastine  (ASTELIN ) 0.1 % nasal spray Place 2 sprays into both nostrils 2 (two) times daily as needed for rhinitis. Use in each nostril as directed  30 mL 12   b complex vitamins tablet Take 1 tablet by mouth daily.     Biotin 89999 MCG TABS Take 1 tablet by mouth daily.     cetirizine  (ZYRTEC ) 10 MG tablet Take 1 tablet (10 mg total) by mouth daily. (Patient taking differently: Take 10 mg by mouth as needed.) 30 tablet 0   ciclopirox  (PENLAC ) 8 % solution Apply topically at bedtime. Apply over nail and surrounding skin. Apply daily over previous coat. After seven (7) days, may remove with alcohol and continue cycle. 6.6 mL 0   diphenhydrAMINE  HCl (BENADRYL  PO) Take by mouth as needed.     fluticasone  (FLONASE ) 50 MCG/ACT nasal spray Place  2 sprays into both nostrils daily. 16 g 6   Insulin  Pen Needle 31G X 5 MM MISC Use daily with Victoza  (Patient taking differently: as needed. Use daily with Victoza ) 50 each 6   levocetirizine (XYZAL ) 5 MG tablet TAKE 1 TABLET BY MOUTH ONCE DAILY IN THE EVENING 90 tablet 0   liraglutide  (VICTOZA ) 18 MG/3ML SOPN Inject 1.8 mg into the skin daily. (Patient taking differently: Inject 1.8 mg into the skin as needed.) 9 mL 1   metFORMIN  (GLUCOPHAGE ) 500 MG tablet Take 1 tablet (500 mg total) by mouth daily with breakfast. (Patient taking differently: Take 500 mg by mouth as needed.) 30 tablet 0   metoprolol  succinate (TOPROL  XL) 25 MG 24 hr tablet Take 1 tablet (25 mg total) by mouth daily. 90 tablet 3   montelukast  (SINGULAIR ) 10 MG tablet TAKE 1 TABLET BY MOUTH AT BEDTIME 90 tablet 0   Multiple Vitamin (MULTIVITAMIN WITH MINERALS) TABS tablet Take 1 tablet by mouth daily.     Omega-3 Fatty Acids (FISH OIL) 1000 MG CAPS Take 1 capsule by mouth daily.     polyethylene glycol (MIRALAX / GLYCOLAX) 17 g packet Take 17 g by mouth daily as needed.     VITAMIN D  PO Take 10,000 Units by mouth once a week.     No current facility-administered medications for this visit.   BP 113/74 (BP Location: Left Arm, Patient Position: Sitting, Cuff Size: Large)   Pulse 65   Ht 5' 6 (1.676 m)   Wt 200 lb (90.7 kg)   LMP 12/31/2023 (Approximate)   SpO2 97%   BMI 32.28 kg/m   PHYSICAL EXAM:  BP 113/74 (BP Location: Left Arm, Patient Position: Sitting, Cuff Size: Large)   Pulse 65   Ht 5' 6 (1.676 m)   Wt 200 lb (90.7 kg)   LMP 12/31/2023 (Approximate)   SpO2 97%   BMI 32.28 kg/m    Salient findings:  CN II-XII intact Bilateral EAC clear and TM intact with well pneumatized middle ear spaces Nose: Anterior rhinoscopy reveals septum deviates right, fairly significant bilateral inferior turbinate hypertrophy.  Nasal endoscopy was indicated to better evaluate the nose and paranasal sinuses, given the patient's  history and exam findings, and is detailed below. No lesions of oral cavity/oropharynx No obviously palpable neck masses/lymphadenopathy/thyromegaly No respiratory distress or stridor   PROCEDURE:  Prior to initiating any procedures, risks/benefits/alternatives were explained to the patient and verbal consent obtained. Diagnostic Nasal Endoscopy Pre-procedure diagnosis: Concern for nasal congestion, nasal septal deviation Post-procedure diagnosis: same Indication: See pre-procedure diagnosis and physical exam above Complications: None apparent EBL: 0 mL Anesthesia: Lidocaine 4% and topical decongestant was topically sprayed in each nasal cavity  Description of Procedure:  Patient was identified. A rigid 30 degree endoscope was utilized to evaluate the  sinonasal cavities, mucosa, sinus ostia and turbinates and septum.  Overall, signs of mucosal inflammation are not noted.  No mucopurulence, polyps, or masses noted.   Right Middle meatus: clear Right SE Recess: clear Left MM: clear Left SE Recess: clear Photodocumentation was obtained.  CPT CODE -- 31231 - Mod 25   ASSESSMENT:  43 y.o. with:  1. Nasal congestion   2. Hypertrophy of both inferior nasal turbinates   3. Nasal obstruction   4. Nasal septal deviation   5. Non-allergic rhinitis    Not having significant CRS sx. Main complaint appears to be nasal congestion with interestingly some typical AR symptoms and improving with PO anthistamine and INCS despite negative RAST. Endo with modest septal deviation and clear bilateral turbinate hypertrophy  PLAN: We've discussed issues and options today.  We reviewed the nasal endoscopy images together.  The risks, benefits and alternatives were discussed and questions answered.  She has elected to proceed with:  1) Would recommend continuing flonase  BID; but after, would use astelin  BID 2) Can try daily rinses if she wishes 3) Continue PO antihistamine  She will call if not  improving after discussion of f/u; consider septo/turbs at that point.   See below regarding exact medications prescribed this encounter including dosages and route: Meds ordered this encounter  Medications   azelastine  (ASTELIN ) 0.1 % nasal spray    Sig: Place 2 sprays into both nostrils 2 (two) times daily as needed for rhinitis. Use in each nostril as directed    Dispense:  30 mL    Refill:  12     Thank you for allowing me the opportunity to care for your patient. Please do not hesitate to contact me should you have any other questions.  Sincerely, Eldora Blanch, MD Otolaryngologist (ENT), [facility] Phone: [phone] Fax: [phone]  MDM:  Level 4: [phone] Complexity/Problems addressed: mod Data complexity: mod - independent interpretation of imaging, review of notes, lab - Morbidity: mod  - Drug prescribed or managed: y  02/04/2024, 9:49 AM

## 2024-02-05 NOTE — Telephone Encounter (Signed)
 Plan Member Name: BHAVIKA SCHNIDER Plan Member ID: [id number] Plan Name: [place]  [id number] Non-Grandfathered Prescriber Name: COREAN SCALA Prescriber Phone: [phone] Prescriber Fax: [phone]  Dear NANETTA MOLT:  [facility]  received a request from your provider for coverage of Liraglutide . The request was denied because:  Your plan only covers this drug when it is used for certain health conditions. Covered use is for type 2 diabetes mellitus. Your plan does not cover the drug for your health condition that your doctor told us  you have. We reviewed the information we had. Your request has been denied.

## 2024-02-21 ENCOUNTER — Encounter: Payer: Self-pay | Admitting: Nurse Practitioner

## 2024-02-23 ENCOUNTER — Telehealth

## 2024-02-23 DIAGNOSIS — N898 Other specified noninflammatory disorders of vagina: Secondary | ICD-10-CM

## 2024-02-24 NOTE — Progress Notes (Signed)
 Because your symptoms are not consistent with yeast infection and feel like you need further testing, I feel your condition warrants further evaluation and I recommend that you be seen for a face to face visit.  Please contact your primary care physician practice to be seen. Many offices offer virtual options to be seen via video if you are not comfortable going in person to a medical facility at this time.  NOTE: You will NOT be charged for this eVisit.  If you do not have a PCP, [facility] offers a free physician referral service available at [phone]. Our trained staff has the experience, knowledge and resources to put you in touch with a physician who is right for you.    If you are having a true medical emergency please call 911.   Your e-visit answers were reviewed by a board certified advanced clinical practitioner to complete your personal care plan.  Thank you for using e-Visits.

## 2024-02-25 ENCOUNTER — Other Ambulatory Visit: Payer: Self-pay | Admitting: Nurse Practitioner

## 2024-02-25 DIAGNOSIS — E66811 Obesity, class 1: Secondary | ICD-10-CM

## 2024-02-25 MED ORDER — LIRAGLUTIDE -WEIGHT MANAGEMENT 18 MG/3ML ~~LOC~~ SOPN: PEN_INJECTOR | SUBCUTANEOUS | 0 refills | Status: DC

## 2024-02-26 ENCOUNTER — Ambulatory Visit: Admitting: Physician Assistant

## 2024-03-12 ENCOUNTER — Other Ambulatory Visit: Payer: Self-pay | Admitting: Nurse Practitioner

## 2024-03-12 DIAGNOSIS — E66811 Other obesity due to excess calories: Secondary | ICD-10-CM

## 2024-03-16 ENCOUNTER — Other Ambulatory Visit: Payer: Self-pay | Admitting: Family Medicine

## 2024-03-16 DIAGNOSIS — L299 Pruritus, unspecified: Secondary | ICD-10-CM

## 2024-03-16 DIAGNOSIS — J309 Allergic rhinitis, unspecified: Secondary | ICD-10-CM

## 2024-03-16 DIAGNOSIS — J310 Chronic rhinitis: Secondary | ICD-10-CM

## 2024-03-25 ENCOUNTER — Ambulatory Visit: Attending: Physician Assistant | Admitting: Physician Assistant

## 2024-03-25 VITALS — BP 118/68 | HR 84 | Ht 66.0 in | Wt 215.0 lb

## 2024-03-25 DIAGNOSIS — I491 Atrial premature depolarization: Secondary | ICD-10-CM | POA: Diagnosis not present

## 2024-03-25 DIAGNOSIS — R0789 Other chest pain: Secondary | ICD-10-CM | POA: Diagnosis not present

## 2024-03-25 NOTE — Patient Instructions (Signed)
 Medication Instructions:   Your physician recommends that you continue on your current medications as directed. Please refer to the Current Medication list given to you today.  *If you need a refill on your cardiac medications before your next appointment, please call your pharmacy*  Lab Work: NONE ORDERED  TODAY    If you have labs (blood work) drawn today and your tests are completely normal, you will receive your results only by: MyChart Message (if you have MyChart) OR A paper copy in the mail If you have any lab test that is abnormal or we need to change your treatment, we will call you to review the results.  Testing/Procedures: NONE ORDERED  TODAY   Follow-Up: At [facility], you and your health needs are our priority.  As part of our continuing mission to provide you with exceptional heart care, our providers are all part of one team.  This team includes your primary Cardiologist (physician) and Advanced Practice Providers or APPs (Physician Assistants and Nurse Practitioners) who all work together to provide you with the care you need, when you need it.  Your next appointment: CONTACT [facility] [phone] AS NEEDED FOR  ANY CARDIAC RELATED SYMPTOMS   We recommend signing up for the patient portal called MyChart.  Sign up information is provided on this After Visit Summary.  MyChart is used to connect with patients for Virtual Visits (Telemedicine).  Patients are able to view lab/test results, encounter notes, upcoming appointments, etc.  Non-urgent messages can be sent to your provider as well.   To learn more about what you can do with MyChart, go to [email or web address].   Other Instructions

## 2024-03-25 NOTE — Progress Notes (Signed)
 Cardiology Office Note   Date:  03/25/2024  ID:  Tamara Ball, Tamara Ball 10/17/1980, MRN [id number] PCP: Tamara Torrence GRADE, MD  [facility] Providers Cardiologist:  Tamara ONEIDA Decent, MD     History of Present Illness Tamara Ball is a 43 y.o. female with a past medical history of iron  deficiency anemia secondary to uterine fibroid, migraine and a history of chest pain.  Patient was previously seen by Dr. CLEMENTEEN Ball for evaluation of atypical chest pain in February 2022 along with palpitation following COVID-pneumonia.  Symptom had resolved 2 weeks prior to the last office visit, therefore no further testing was recommended.  She was seen again in December 2024 for frequent palpitation during exercise.  Coronary CTA obtained in December 2024 showed a coronary calcium score of 0, no evidence of CAD.  Echocardiogram obtained on 05/22/2023 showed EF 55 to 60%, no regional wall motion abnormality, normal RV, no significant valve issue.  7-day ZIO monitor in January 2025 showed frequent PACs with 7.1% burden, occasional PVCs 1.4% burden.  Metoprolol  was recommended by Dr. Decent however patient did not pick it up.  She presented to the emergency room in August 2025 with complaint of chest discomfort.  EKG was done showed no acute changes.  Troponin was negative.  D-dimer within normal limit.  She was last seen by Tamara Shams, NP on 12/27/2023 for evaluation of chest pain.  She has been followed by hematology service for regular iron  infusion.  She has also been told that her body did not absorb iron  from her diet.  I was recommended for the patient to start metoprolol  succinate 25 mg daily to reduce ectopy burden and anginal symptoms.  Patient presents today for follow-up.  She currently only taking metoprolol  succinate at night.  She described her chest pain as a left-sided chest discomfort under her left breast that occurs about once a week.  Symptom does not correlate with exertion and typically occurs  at rest.  She was at a meeting yesterday when she had chest discomfort.  On the other hand, she was able to exercise in gym yesterday without any exertional discomfort.  Her overall symptom has improved after starting on metoprolol .  At this time, I recommended continue on the current therapy.  She does not require any further cardiac workup.  In the future, if the frequency of her chest discomfort decreases, I think will be reasonable for her to try to wean her off of metoprolol .  Otherwise, she can follow-up with cardiology service as needed.  ROS:   Patient continues to have intermittent left-sided chest discomfort.  She denies any significant shortness of breath with exertion.  She has no lower extremity edema, orthopnea or PND  Studies Reviewed      Cardiac Studies & Procedures   ______________________________________________________________________________________________     ECHOCARDIOGRAM  ECHOCARDIOGRAM COMPLETE 05/22/2023  Narrative ECHOCARDIOGRAM REPORT    Patient Name:   Tamara Ball Date of Exam: 05/22/2023 Medical Rec #:  [id number]      Height:       66.0 in Accession #:    [id number]     Weight:       186.0 lb Date of Birth:  08/04/1980       BSA:          1.939 m Patient Age:    42 years       BP:           118/72 mmHg Patient Gender: F  HR:           68 bpm. Exam Location:  Outpatient  Procedure: 2D Echo, 3D Echo, Cardiac Doppler, Color Doppler and Strain Analysis  Indications:    R07.2 Precordial pain; R94.31 Abnormal EKG  History:        Patient has prior history of Echocardiogram examinations, most recent 06/04/2014. Abnormal ECG, Signs/Symptoms:Chest Pain; Risk Factors:Non-Smoker. Patient has had chest pains with fluttering in her chest for about 6-7 months. She denies SOB and leg edema.  Sonographer:    Annabella Cater RVT, RDCS (AE), RDMS Referring Phys: [phone] Tamara Ball  IMPRESSIONS   1. Left ventricular ejection fraction, by  estimation, is 55 to 60%. Left ventricular ejection fraction by 3D volume is 57 %. The left ventricle has normal function. The left ventricle has no regional wall motion abnormalities. Left ventricular diastolic parameters were normal. The average left ventricular global longitudinal strain is -20.3 %. The global longitudinal strain is normal. 2. Right ventricular systolic function is normal. The right ventricular size is normal. There is normal pulmonary artery systolic pressure. 3. The mitral valve is normal in structure. No evidence of mitral valve regurgitation. No evidence of mitral stenosis. 4. The aortic valve is tricuspid. Aortic valve regurgitation is not visualized. No aortic stenosis is present. 5. The inferior vena cava is dilated in size with >50% respiratory variability, suggesting right atrial pressure of 8 mmHg.  Comparison(s): EF 55%.  FINDINGS Left Ventricle: Left ventricular ejection fraction, by estimation, is 55 to 60%. Left ventricular ejection fraction by 3D volume is 57 %. The left ventricle has normal function. The left ventricle has no regional wall motion abnormalities. The average left ventricular global longitudinal strain is -20.3 %. The global longitudinal strain is normal. The left ventricular internal cavity size was normal in size. There is no left ventricular hypertrophy. Left ventricular diastolic parameters were normal. Normal left ventricular filling pressure.  Right Ventricle: The right ventricular size is normal. No increase in right ventricular wall thickness. Right ventricular systolic function is normal. There is normal pulmonary artery systolic pressure. The tricuspid regurgitant velocity is 1.94 m/s, and with an assumed right atrial pressure of 8 mmHg, the estimated right ventricular systolic pressure is 23.1 mmHg.  Left Atrium: Left atrial size was normal in size.  Right Atrium: Right atrial size was normal in size.  Pericardium: There is no evidence  of pericardial effusion.  Mitral Valve: The mitral valve is normal in structure. No evidence of mitral valve regurgitation. No evidence of mitral valve stenosis.  Tricuspid Valve: The tricuspid valve is normal in structure. Tricuspid valve regurgitation is trivial. No evidence of tricuspid stenosis.  Aortic Valve: The aortic valve is tricuspid. Aortic valve regurgitation is not visualized. No aortic stenosis is present. Aortic valve mean gradient measures 5.0 mmHg. Aortic valve peak gradient measures 3.3 mmHg. Aortic valve area, by VTI measures 2.18 cm.  Pulmonic Valve: The pulmonic valve was normal in structure. Pulmonic valve regurgitation is not visualized. No evidence of pulmonic stenosis.  Aorta: The aortic root is normal in size and structure.  Venous: The inferior vena cava is dilated in size with greater than 50% respiratory variability, suggesting right atrial pressure of 8 mmHg.  IAS/Shunts: No atrial level shunt detected by color flow Doppler.   LEFT VENTRICLE PLAX 2D LVIDd:         5.06 cm         Diastology LVIDs:         3.75 cm  LV e' medial:    12.30 cm/s LV PW:         0.92 cm         LV E/e' medial:  7.8 LV IVS:        0.60 cm         LV e' lateral:   12.40 cm/s LVOT diam:     1.78 cm         LV E/e' lateral: 7.8 LV SV:         66 LV SV Index:   34              2D LVOT Area:     2.49 cm        Longitudinal Strain 2D Strain GLS  -20.4 % (A2C): 2D Strain GLS  -18.5 % (A3C): 2D Strain GLS  -21.8 % (A4C): 2D Strain GLS  -20.3 % Avg:  3D Volume EF LV 3D EF:    Left ventricul ar ejection fraction by 3D volume is 57 %.  3D Volume EF: 3D EF:        57 % LV EDV:       139 ml LV ESV:       60 ml LV SV:        79 ml  RIGHT VENTRICLE RV S prime:     15.40 cm/s TAPSE (M-mode): 2.6 cm  LEFT ATRIUM             Index        RIGHT ATRIUM           Index LA diam:        3.94 cm 2.03 cm/m   RA Area:     12.80 cm LA Vol (A2C):   52.7 ml 27.18 ml/m   RA Volume:   26.20 ml  13.51 ml/m LA Vol (A4C):   45.3 ml 23.36 ml/m LA Biplane Vol: 49.0 ml 25.27 ml/m AORTIC VALVE                     PULMONIC VALVE AV Area (Vmax):    3.34 cm      PV Vmax:       1.24 m/s AV Area (Vmean):   1.96 cm      PV Peak grad:  6.2 mmHg AV Area (VTI):     2.18 cm AV Vmax:           90.85 cm/s AV Vmean:          100.000 cm/s AV VTI:            0.302 m AV Peak Grad:      3.3 mmHg AV Mean Grad:      5.0 mmHg LVOT Vmax:         122.00 cm/s LVOT Vmean:        78.700 cm/s LVOT VTI:          0.265 m LVOT/AV VTI ratio: 0.88  AORTA Ao Root diam: 2.86 cm Ao Arch diam: 2.8 cm  MITRAL VALVE               TRICUSPID VALVE MV Area (PHT): 3.79 cm    TR Peak grad:   15.1 mmHg MV Decel Time: 200 msec    TR Vmax:        194.00 cm/s MV E velocity: 96.20 cm/s MV A velocity: 82.30 cm/s  SHUNTS MV E/A ratio:  1.17        Systemic VTI:  0.26 m Systemic Diam: 1.78 cm  Annabella Scarce MD Electronically signed by Annabella Scarce MD Signature Date/Time: 05/22/2023/2:44:38 PM    Final    MONITORS  LONG TERM MONITOR (3-14 DAYS) 05/16/2023  Narrative Patch Wear Time:  7 days and 19 hours (2024-12-15T00:39:07-498 to 2024-12-26T08:36:35-0500)  Monitor 1 (22 hours) Patient had a min HR of 58 bpm (sinus bradycardia), max HR of 147 bpm (sinus tachycardia), and avg HR of 82 bpm (normal sinus rhythm). Predominant underlying rhythm was Sinus Rhythm. Isolated SVEs were frequent (6.5%, 6946), SVE Couplets were occasional (3.8%, 2040), and no SVE Triplets were present. Isolated VEs were occasional (1.4%, 1459), and no VE Couplets or VE Triplets were present.  Monitor 2 (6 days 22 hours) Patient had a min HR of 52 bpm (sinus bradycardia), max HR of 139 bpm (sinus tachycardia), and avg HR of 79 bpm (normal sinus rhythm). Predominant underlying rhythm was Sinus Rhythm. Isolated SVEs were frequent (5.5%, [id number]), SVE Couplets were occasional (1.6%, 6014), and SVE Triplets were  rare (<1.0%, 4). Isolated  VEs were rare (<1.0%), and no VE Couplets or VE Triplets were present. Ventricular Bigeminy and Trigeminy were present.  Impression: Frequent PACs (7.1%). Occasional PVCs (1.4%).  Tamara T. Barbaraann, MD, [facility]  [facility] [address] [place], [place] [address] [phone] 9:35 PM   CT SCANS  CT CORONARY MORPH W/CTA COR W/SCORE 05/03/2023  Addendum 05/06/2023  6:55 PM ADDENDUM REPORT: 05/06/2023 18:53  EXAM: OVER-READ INTERPRETATION  CT CHEST  The following report is an over-read performed by radiologist Dr. Andrea Gasman of [facility] on 05/06/2023. This over-read does not include interpretation of cardiac or coronary anatomy or pathology. The coronary CTA interpretation by the cardiologist is attached.  COMPARISON:  None.  FINDINGS: Vascular: No aortic atherosclerosis. The included aorta is normal in caliber.  Mediastinum/nodes: No adenopathy or mass. Unremarkable esophagus.  Lungs: No focal airspace disease. No pulmonary nodule. No pleural fluid. The included airways are patent.  Upper abdomen: No acute or unexpected findings.  Musculoskeletal: There are no acute or suspicious osseous abnormalities.  IMPRESSION: No acute or unexpected extracardiac findings.   Electronically Signed By: Andrea Gasman M.D. On: 05/06/2023 18:53  Narrative CLINICAL DATA:  75F with chest pain  EXAM: Cardiac/Coronary CTA  TECHNIQUE: The patient was scanned on a [employer].  FINDINGS: A 100 kV prospective scan was triggered in the descending thoracic aorta at 111 HU's. Axial non-contrast 3 mm slices were carried out through the heart. The data set was analyzed on a dedicated work station and scored using the Agatson method. Gantry rotation speed was 250 msecs and collimation was .6 mm. No beta blockade and 0.8 mg of sl NTG was given. The 3D data set was reconstructed in 5% intervals of  the 35-75% of the R-R cycle. Phases were analyzed on a dedicated work station using MPR, MIP and VRT modes. The patient received 100 cc of contrast.  Coronary Arteries:  Normal coronary origin.  Left dominance.  RCA is a small nondominant artery.  There is no plaque.  Left main is a large artery that gives rise to LAD and LCX arteries.  LAD is a large vessel that has no plaque.  LCX is a dominant artery.  There is no plaque.  Other findings:  Left Ventricle: Normal size  Left Atrium: Mild enlargement  Pulmonary Veins: Normal configuration  Right Ventricle: Normal size  Right Atrium: Normal size  Cardiac valves: No calcifications  Thoracic aorta: Normal size  Pulmonary Arteries: Normal size  Systemic Veins: Normal drainage  Pericardium: Normal thickness  IMPRESSION: 1.  Coronary calcium score of 0.  2.  Normal coronary origin with left dominance.  3.  No evidence of CAD.  CAD-RADS 0. No evidence of CAD (0%). Consider non-atherosclerotic causes of chest pain.  Electronically Signed: By: Lonni Nanas M.D. On: 05/04/2023 13:48     ______________________________________________________________________________________________      Risk Assessment/Calculations           Physical Exam VS:  BP 118/68   Pulse 84   Ht 5' 6 (1.676 m)   Wt 215 lb (97.5 kg)   SpO2 98%   BMI 34.70 kg/m        Wt Readings from Last 3 Encounters:  03/25/24 215 lb (97.5 kg)  02/04/24 200 lb (90.7 kg)  01/10/24 200 lb (90.7 kg)    GEN: Well nourished, well developed in no acute distress NECK: No JVD; No carotid bruits CARDIAC: RRR, no murmurs, rubs, gallops RESPIRATORY:  Clear to auscultation without rales, wheezing or rhonchi  ABDOMEN: Soft, non-tender, non-distended EXTREMITIES:  No edema; No deformity   ASSESSMENT AND PLAN  Atypical chest pain: Symptom does not correlate with exertion.  Previous coronary CTA in December 2024 showed clean coronary arteries.   No further workup is needed  Frequent PACs: Previous heart monitor in January 2025 showed PAC burden of 7.1%.  Patient has been started on metoprolol  succinate and the tolerating the medication.        Dispo: Follow-up with cardiology service as needed  Signed, Danylle Ouk, PA

## 2024-03-27 ENCOUNTER — Ambulatory Visit: Admitting: Nurse Practitioner

## 2024-04-07 ENCOUNTER — Inpatient Hospital Stay: Admitting: Family

## 2024-04-07 ENCOUNTER — Inpatient Hospital Stay: Attending: Family

## 2024-04-07 ENCOUNTER — Encounter: Payer: Self-pay | Admitting: Family

## 2024-04-07 VITALS — BP 109/68 | HR 62 | Temp 98.2°F | Resp 17 | Wt 213.0 lb

## 2024-04-07 DIAGNOSIS — D500 Iron deficiency anemia secondary to blood loss (chronic): Secondary | ICD-10-CM

## 2024-04-07 DIAGNOSIS — N920 Excessive and frequent menstruation with regular cycle: Secondary | ICD-10-CM

## 2024-04-07 LAB — CBC WITH DIFFERENTIAL (CANCER CENTER ONLY)
Abs Immature Granulocytes: 0.01 K/uL (ref 0.00–0.07)
Basophils Absolute: 0 K/uL (ref 0.0–0.1)
Basophils Relative: 1 %
Eosinophils Absolute: 0.6 K/uL — ABNORMAL HIGH (ref 0.0–0.5)
Eosinophils Relative: 9 %
HCT: 41.1 % (ref 36.0–46.0)
Hemoglobin: 13.3 g/dL (ref 12.0–15.0)
Immature Granulocytes: 0 %
Lymphocytes Relative: 22 %
Lymphs Abs: 1.5 K/uL (ref 0.7–4.0)
MCH: 28.2 pg (ref 26.0–34.0)
MCHC: 32.4 g/dL (ref 30.0–36.0)
MCV: 87.3 fL (ref 80.0–100.0)
Monocytes Absolute: 0.5 K/uL (ref 0.1–1.0)
Monocytes Relative: 7 %
Neutro Abs: 4.2 K/uL (ref 1.7–7.7)
Neutrophils Relative %: 61 %
Platelet Count: 229 K/uL (ref 150–400)
RBC: 4.71 MIL/uL (ref 3.87–5.11)
RDW: 13.7 % (ref 11.5–15.5)
WBC Count: 6.8 K/uL (ref 4.0–10.5)
nRBC: 0 % (ref 0.0–0.2)

## 2024-04-07 LAB — CMP (CANCER CENTER ONLY)
ALT: 8 U/L (ref 0–44)
AST: 17 U/L (ref 15–41)
Albumin: 4.3 g/dL (ref 3.5–5.0)
Alkaline Phosphatase: 72 U/L (ref 38–126)
Anion gap: 9 (ref 5–15)
BUN: 16 mg/dL (ref 6–20)
CO2: 23 mmol/L (ref 22–32)
Calcium: 9.4 mg/dL (ref 8.9–10.3)
Chloride: 107 mmol/L (ref 98–111)
Creatinine: 1.01 mg/dL — ABNORMAL HIGH (ref 0.44–1.00)
GFR, Estimated: >60 mL/min (ref >60)
Glucose, Bld: 88 mg/dL (ref 70–99)
Potassium: 4.9 mmol/L (ref 3.5–5.1)
Sodium: 139 mmol/L (ref 135–145)
Total Bilirubin: 0.4 mg/dL (ref 0.0–1.2)
Total Protein: 7.4 g/dL (ref 6.5–8.1)

## 2024-04-07 LAB — IRON AND IRON BINDING CAPACITY (CC-WL,HP ONLY)
Iron: 51 ug/dL (ref 28–170)
Saturation Ratios: 10 % — ABNORMAL LOW (ref 10.4–31.8)
TIBC: 524 ug/dL — ABNORMAL HIGH (ref 250–450)
UIBC: 473 ug/dL

## 2024-04-07 LAB — FERRITIN: Ferritin: 19 ng/mL (ref 11–307)

## 2024-04-07 NOTE — Progress Notes (Signed)
 Hematology and Oncology Follow Up Visit  Tamara Ball [id number] [birth year] 43 y.o. 04/07/2024   Principle Diagnosis:  Iron  deficiency anemia secondary to menorrhagia    Current Therapy:        IV iron  as indicated    Interim History:  Ms. Tamara Ball is here today for follow-up. She is doing fairly well but still notes persistent fatigue with occasional chest discomfort and palpitations. She states that she has seen cardiology and it is felt her cardiac symptoms are related to iron  deficiency.  Her cycle remains heavy the first few days. She is hoping to have another ultrasound for insurance purposes and have her fibroid treatment approved.  No other blood loss noted.  No abnormal bruising, no petechiae.  No fever, chills, n/v, cough, rash, dizziness, SOB, abdominal pain or changes in bowel or bladder habits.  No swelling, numbness or tingling in her extremities.  No falls or syncope reported.  Appetite and hydration are good. Weight is stable at 213 lbs.   ECOG Performance Status: 1 - Symptomatic but completely ambulatory  Medications:  Allergies as of 04/07/2024   No Known Allergies      Medication List        Accurate as of April 07, 2024  8:59 AM. If you have any questions, ask your nurse or doctor.          azelastine  0.1 % nasal spray Commonly known as: ASTELIN  Place 2 sprays into both nostrils 2 (two) times daily as needed for rhinitis. Use in each nostril as directed   b complex vitamins tablet Take 1 tablet by mouth daily.   BENADRYL  PO Take by mouth as needed.   Biotin 10000 MCG Tabs Take 1 tablet by mouth daily.   ciclopirox  8 % solution Commonly known as: PENLAC  Apply topically at bedtime. Apply over nail and surrounding skin. Apply daily over previous coat. After seven (7) days, may remove with alcohol and continue cycle.   Fish Oil 1000 MG Caps Take 1 capsule by mouth daily.   fluticasone  50 MCG/ACT nasal spray Commonly known as: FLONASE  Place  2 sprays into both nostrils daily.   Insulin  Pen Needle 31G X 5 MM Misc Use daily with Victoza  What changed:  when to take this reasons to take this   levocetirizine 5 MG tablet Commonly known as: XYZAL  TAKE 1 TABLET BY MOUTH ONCE DAILY IN THE EVENING   Liraglutide  -Weight Management 18 MG/3ML Sopn Commonly known as: Saxenda  Week 1,0.6 mg SQ once a day, Week 2,1.2 mg SQ once a day, Week 3,1.8 mg  SQ once a day, Week 4,2.4 mg SQ once a day, Week 5+,3.0 mg SQ once a day   metFORMIN  500 MG tablet Commonly known as: GLUCOPHAGE  Take 1 tablet (500 mg total) by mouth daily with breakfast. What changed:  when to take this reasons to take this   metoprolol  succinate 25 MG 24 hr tablet Commonly known as: Toprol  XL Take 1 tablet (25 mg total) by mouth daily.   montelukast  10 MG tablet Commonly known as: SINGULAIR  TAKE 1 TABLET BY MOUTH AT BEDTIME   multivitamin with minerals Tabs tablet Take 1 tablet by mouth daily.   polyethylene glycol 17 g packet Commonly known as: MIRALAX / GLYCOLAX Take 17 g by mouth daily as needed.   VITAMIN D  PO Take 10,000 Units by mouth once a week.        Allergies: No Known Allergies  Past Medical History, Surgical history, Social history, and Family  History were reviewed and updated.  Review of Systems: All other 10 point review of systems is negative.   Physical Exam:  vitals were not taken for this visit.   Wt Readings from Last 3 Encounters:  03/25/24 215 lb (97.5 kg)  02/04/24 200 lb (90.7 kg)  01/10/24 200 lb (90.7 kg)    Ocular: Sclerae unicteric, pupils equal, round and reactive to light Ear-nose-throat: Oropharynx clear, dentition fair Lymphatic: No cervical or supraclavicular adenopathy Lungs no rales or rhonchi, good excursion bilaterally Heart regular rate and rhythm, no murmur appreciated Abd soft, nontender, positive bowel sounds MSK no focal spinal tenderness, no joint edema Neuro: non-focal, well-oriented,  appropriate affect Breasts: Deferred   Lab Results  Component Value Date   WBC 6.8 04/07/2024   HGB 13.3 04/07/2024   HCT 41.1 04/07/2024   MCV 87.3 04/07/2024   PLT 229 04/07/2024   Lab Results  Component Value Date   FERRITIN 7 (L) 12/07/2023   IRON  23 (L) 12/07/2023   TIBC 531 (H) 12/07/2023   UIBC 508 12/07/2023   IRONPCTSAT 4 (L) 12/07/2023   Lab Results  Component Value Date   RETICCTPCT 1.2 03/26/2023   RBC 4.71 04/07/2024   RETICCTABS 64,400 05/16/2016   No results found for: KPAFRELGTCHN, LAMBDASER, KAPLAMBRATIO No results found for: IGGSERUM, IGA, IGMSERUM No results found for: STEPHANY CARLOTA BENSON MARKEL EARLA JOANNIE DOC VICK, SPEI   Chemistry      Component Value Date/Time   NA 137 12/17/2023 1314   NA 141 08/09/2023 1029   K 4.2 12/17/2023 1314   K 4.2 08/11/2016 1404   CL 104 12/17/2023 1314   CL 103 08/11/2016 1404   CO2 21 (L) 12/17/2023 1314   CO2 26 08/11/2016 1404   BUN 14 12/17/2023 1314   BUN 10 08/09/2023 1029   CREATININE 0.88 12/17/2023 1314   CREATININE 0.98 12/07/2023 1258   CREATININE 0.87 08/11/2016 1404      Component Value Date/Time   CALCIUM 9.6 12/17/2023 1314   CALCIUM 9.5 08/11/2016 1404   ALKPHOS 67 12/07/2023 1258   ALKPHOS 58 08/11/2016 1404   AST 16 12/07/2023 1258   ALT 9 12/07/2023 1258   BILITOT 0.4 12/07/2023 1258       Impression and Plan: Ms. Tamara Ball is a very pleasant 43 yo African American female with iron  deficiency secondary to heavy cycles/uterine fibroids.   Iron  studies are pending. We will replace if needed.  Follow-up in 4 months.   Lauraine Pepper, NP 12/1/20258:59 AM

## 2024-04-08 ENCOUNTER — Encounter: Payer: Self-pay | Admitting: Nurse Practitioner

## 2024-04-08 ENCOUNTER — Ambulatory Visit: Admitting: Nurse Practitioner

## 2024-04-08 VITALS — BP 111/72 | HR 62 | Temp 97.6°F | Ht 66.0 in | Wt 208.0 lb

## 2024-04-08 DIAGNOSIS — E66811 Obesity, class 1: Secondary | ICD-10-CM | POA: Diagnosis not present

## 2024-04-08 DIAGNOSIS — Z6833 Body mass index (BMI) 33.0-33.9, adult: Secondary | ICD-10-CM | POA: Diagnosis not present

## 2024-04-08 DIAGNOSIS — R110 Nausea: Secondary | ICD-10-CM | POA: Diagnosis not present

## 2024-04-08 MED ORDER — ONDANSETRON HCL 4 MG PO TABS: 4.0000 mg | ORAL_TABLET | Freq: Three times a day (TID) | ORAL | 0 refills | Status: AC | PRN

## 2024-04-08 NOTE — Progress Notes (Signed)
 Office: [phone]  /  Fax: [phone]  WEIGHT SUMMARY AND BIOMETRICS  Weight Lost Since Last Visit: 0lb  Weight Gained Since Last Visit: 8lb   Vitals Temp: 97.6 F (36.4 C) BP: 111/72 Pulse Rate: 62 SpO2: 100 %   Anthropometric Measurements Height: 5' 6 (1.676 m) Weight: 208 lb (94.3 kg) BMI (Calculated): 33.59 Weight at Last Visit: 200lb Weight Lost Since Last Visit: 0lb Weight Gained Since Last Visit: 8lb Starting Weight: 205lb Total Weight Loss (lbs): 0 lb (0 kg)   Body Composition  Body Fat %: 38.1 % Fat Mass (lbs): 79.4 lbs Muscle Mass (lbs): 122.6 lbs Total Body Water (lbs): 81.8 lbs Visceral Fat Rating : 9   Other Clinical Data Fasting: No Labs: No Today's Visit #: 55 Starting Date: 06/13/18     HPI  Chief Complaint: OBESITY  Tamara Ball is here to discuss her progress with her obesity treatment plan. She is on the the Category 3 Plan and states she is following her eating plan approximately 80 % of the time. She states she is exercising 60 minutes 2-3 days per week.   Interval History:  Since last office visit she has gained 8 pounds.  She reports that she was 215 lbs several weeks ago at home and yesterday she weighed 208 lbs at home.  She has starting losing weight since increasing the Saxenda  dose. She is drinking water daily.  Denies sugary drinks.  She has been traveling weekly since the end of September so her exercise has not been constant due to traveling.  She is not going to travel again until February.  She restarted exercising last week.     Pharmacotherapy for weight loss: She is currently taking Saxenda  3mg  (x 3 weeks) for medical weight loss-she was given Victoza  instead of given Saxenda  at the pharmacy-the pharmacist has since given her Saxenda  and she has been able to titrate up to 3 mg 3 weeks ago.  Reports side effects of nausea.  Has helped with polyphagia and cravings.    Previous pharmacotherapy for medical weight loss:    -Ozempic , Saxenda , Victoza  and Rybelsus .  Felt that Ozempic  worked the best for her but is unable to take due to cost.     Bariatric surgery:  Patient has not had bariatric surgery      PHYSICAL EXAM:  Blood pressure 111/72, pulse 62, temperature 97.6 F (36.4 C), height 5' 6 (1.676 m), weight 208 lb (94.3 kg), last menstrual period 04/08/2024, SpO2 100%. Body mass index is 33.57 kg/m.  General: She is overweight, cooperative, alert, well developed, and in no acute distress. PSYCH: Has normal mood, affect and thought process.   Extremities: No edema.  Neurologic: No gross sensory or motor deficits. No tremors or fasciculations noted.    DIAGNOSTIC DATA REVIEWED:  BMET    Component Value Date/Time   NA 139 04/07/2024 0841   NA 141 08/09/2023 1029   K 4.9 04/07/2024 0841   K 4.2 08/11/2016 1404   CL 107 04/07/2024 0841   CL 103 08/11/2016 1404   CO2 23 04/07/2024 0841   CO2 26 08/11/2016 1404   GLUCOSE 88 04/07/2024 0841   BUN 16 04/07/2024 0841   BUN 10 08/09/2023 1029   CREATININE 1.01 (H) 04/07/2024 0841   CREATININE 0.87 08/11/2016 1404   CALCIUM 9.4 04/07/2024 0841   CALCIUM 9.5 08/11/2016 1404   GFRNONAA >60 04/07/2024 0841   GFRAA 83 01/27/2020 0844   Lab Results  Component Value Date  HGBA1C 5.3 08/09/2023   HGBA1C 5.0 04/07/2015   Lab Results  Component Value Date   INSULIN  21.6 08/09/2023   INSULIN  8.9 06/13/2018   Lab Results  Component Value Date   TSH 1.940 08/09/2023   CBC    Component Value Date/Time   WBC 6.8 04/07/2024 0841   WBC 8.0 12/17/2023 1314   RBC 4.71 04/07/2024 0841   HGB 13.3 04/07/2024 0841   HGB 11.4 08/09/2023 1029   HGB 14.2 04/20/2017 1524   HCT 41.1 04/07/2024 0841   HCT 37.0 08/09/2023 1029   HCT 43.2 04/20/2017 1524   PLT 229 04/07/2024 0841   PLT 261 08/09/2023 1029   MCV 87.3 04/07/2024 0841   MCV 84 08/09/2023 1029   MCV 90 04/20/2017 1524   MCH 28.2 04/07/2024 0841   MCHC 32.4 04/07/2024 0841   RDW  13.7 04/07/2024 0841   RDW 13.3 08/09/2023 1029   RDW 13.5 04/20/2017 1524   Iron  Studies    Component Value Date/Time   IRON  51 04/07/2024 0841   IRON  24 (L) 08/09/2023 1029   IRON  103 04/20/2017 1524   TIBC 524 (H) 04/07/2024 0841   TIBC 450 (H) 04/20/2017 1524   FERRITIN 19 04/07/2024 0841   FERRITIN 8 (L) 08/09/2023 1029   FERRITIN 23 04/20/2017 1524   IRONPCTSAT 10 (L) 04/07/2024 0841   IRONPCTSAT 23 04/20/2017 1524   IRONPCTSAT 18 (L) 10/30/2014 1213   Lipid Panel     Component Value Date/Time   CHOL 126 08/09/2023 1029   TRIG 26 08/09/2023 1029   HDL 59 08/09/2023 1029   CHOLHDL 2.2 07/13/2020 1421   CHOLHDL 2 05/27/2018 0848   VLDL 10.4 05/27/2018 0848   LDLCALC 59 08/09/2023 1029   Hepatic Function Panel     Component Value Date/Time   PROT 7.4 04/07/2024 0841   PROT 6.9 08/09/2023 1029   ALBUMIN 4.3 04/07/2024 0841   ALBUMIN 4.1 08/09/2023 1029   ALBUMIN 4.2 08/11/2016 1404   AST 17 04/07/2024 0841   ALT 8 04/07/2024 0841   ALKPHOS 72 04/07/2024 0841   ALKPHOS 58 08/11/2016 1404   BILITOT 0.4 04/07/2024 0841   BILIDIR 0.0 06/24/2012 1055      Component Value Date/Time   TSH 1.940 08/09/2023 1029   Nutritional Lab Results  Component Value Date   VD25OH 51.3 07/19/2022   VD25OH 37.3 01/05/2022   VD25OH 45.01 02/24/2021     ASSESSMENT AND PLAN  TREATMENT PLAN FOR OBESITY:  Recommended Dietary Goals  Tamara Ball is currently in the action stage of change. As such, her goal is to continue weight management plan. She has agreed to practicing portion control and making smarter food choices, such as increasing vegetables and decreasing simple carbohydrates.  Behavioral Intervention  We discussed the following Behavioral Modification Strategies today: increasing lean protein intake to established goals, decreasing simple carbohydrates , increasing vegetables, increasing fiber rich foods, increasing water intake , work on meal planning and preparation,  reading food labels , keeping healthy foods at home, celebration eating strategies, continue to work on maintaining a reduced calorie state, getting the recommended amount of protein, incorporating whole foods, making healthy choices, staying well hydrated and practicing mindfulness when eating., and increase protein intake, fibrous foods (25 grams per day for women, 30 grams for men) and water to improve satiety and decrease hunger signals. .  Additional resources provided today: NA  Recommended Physical Activity Goals  Adelise has been advised to work up to 150 minutes of  moderate intensity aerobic activity a week and strengthening exercises 2-3 times per week for cardiovascular health, weight loss maintenance and preservation of muscle mass.   She has agreed to Think about enjoyable ways to increase daily physical activity and overcoming barriers to exercise, Increase physical activity in their day and reduce sedentary time (increase NEAT)., Continue to gradually increase the amount and intensity of exercise routine, Increase volume of physical activity to a goal of 240 minutes a week, and Combine aerobic and strengthening exercises for efficiency and improved cardiometabolic health.   Pharmacotherapy We discussed various medication options to help Tamara Ball with her weight loss efforts and we both agreed to continue Saxenda  3mg .  Side effects discussed.  Due to cost will consider changing to Wegovy  at next visit.  She is not currently interested in starting Zepbound.   ASSOCIATED CONDITIONS ADDRESSED TODAY  Action/Plan  Nausea -     Ondansetron  HCl; Take 1 tablet (4 mg total) by mouth every 8 (eight) hours as needed for nausea or vomiting.  Dispense: 30 tablet; Refill: 0. Side effects discussed.    Obesity, Class I, BMI 30-34.9         Return in about 4 weeks (around 05/06/2024).SABRA She was informed of the importance of frequent follow up visits to maximize her success with intensive  lifestyle modifications for her multiple health conditions.   ATTESTASTION STATEMENTS:  Reviewed by clinician on day of visit: allergies, medications, problem list, medical history, surgical history, family history, social history, and previous encounter notes.     Corean SAUNDERS. Tamara Reisner FNP-C

## 2024-04-11 ENCOUNTER — Inpatient Hospital Stay (HOSPITAL_COMMUNITY)
Admission: RE | Admit: 2024-04-11 | Discharge: 2024-04-11 | Payer: Self-pay | Attending: Medical Genetics | Admitting: Medical Genetics

## 2024-04-17 ENCOUNTER — Inpatient Hospital Stay

## 2024-04-17 VITALS — BP 115/73 | HR 74 | Temp 97.8°F | Resp 20

## 2024-04-17 DIAGNOSIS — D500 Iron deficiency anemia secondary to blood loss (chronic): Secondary | ICD-10-CM

## 2024-04-17 DIAGNOSIS — N920 Excessive and frequent menstruation with regular cycle: Secondary | ICD-10-CM

## 2024-04-17 MED ORDER — SODIUM CHLORIDE 0.9 % IV SOLN: Freq: Once | INTRAVENOUS | Status: DC | PRN

## 2024-04-17 MED ORDER — IRON SUCROSE 300 MG IVPB - SIMPLE MED
300.0000 mg | Freq: Once | Status: AC
  Administered 2024-04-17: 300 mg via INTRAVENOUS
  Filled 2024-04-17: qty 300

## 2024-04-17 NOTE — Progress Notes (Signed)
 At 1115, infusion completed. Pt refused to stay  for 30 minutes post transfusion monitoring .VSS, , steady gait noted at discharge

## 2024-04-17 NOTE — Patient Instructions (Signed)

## 2024-04-24 ENCOUNTER — Inpatient Hospital Stay

## 2024-04-24 VITALS — BP 114/68 | HR 66 | Temp 98.1°F | Resp 16

## 2024-04-24 DIAGNOSIS — D500 Iron deficiency anemia secondary to blood loss (chronic): Secondary | ICD-10-CM

## 2024-04-24 DIAGNOSIS — N920 Excessive and frequent menstruation with regular cycle: Secondary | ICD-10-CM

## 2024-04-24 MED ORDER — SODIUM CHLORIDE 0.9 % IV SOLN: Freq: Once | INTRAVENOUS | Status: AC

## 2024-04-24 MED ORDER — IRON SUCROSE 300 MG IVPB - SIMPLE MED
300.0000 mg | Freq: Once | Status: AC
  Administered 2024-04-24: 11:00:00 300 mg via INTRAVENOUS
  Filled 2024-04-24: qty 300

## 2024-04-24 NOTE — Patient Instructions (Signed)
 [facility] - A DEPT OF [facility]  Discharge Instructions: Thank you for choosing [facility] to provide your oncology and hematology care.   If you have a lab appointment with the Cancer Center, please go directly to the Cancer Center and check in at the registration area.  Wear comfortable clothing and clothing appropriate for easy access to any Portacath or PICC line.   We strive to give you quality time with your provider. You may need to reschedule your appointment if you arrive late (15 or more minutes).  Arriving late affects you and other patients whose appointments are after yours.  Also, if you miss three or more appointments without notifying the office, you may be dismissed from the clinic at the providers discretion.      For prescription refill requests, have your pharmacy contact our office and allow 72 hours for refills to be completed.    Today you received the following chemotherapy and/or immunotherapy agents venofer       To help prevent nausea and vomiting after your treatment, we encourage you to take your nausea medication as directed.  BELOW ARE SYMPTOMS THAT SHOULD BE REPORTED IMMEDIATELY: *FEVER GREATER THAN 100.4 F (38 C) OR HIGHER *CHILLS OR SWEATING *NAUSEA AND VOMITING THAT IS NOT CONTROLLED WITH YOUR NAUSEA MEDICATION *UNUSUAL SHORTNESS OF BREATH *UNUSUAL BRUISING OR BLEEDING *URINARY PROBLEMS (pain or burning when urinating, or frequent urination) *BOWEL PROBLEMS (unusual diarrhea, constipation, pain near the anus) TENDERNESS IN MOUTH AND THROAT WITH OR WITHOUT PRESENCE OF ULCERS (sore throat, sores in mouth, or a toothache) UNUSUAL RASH, SWELLING OR PAIN  UNUSUAL VAGINAL DISCHARGE OR ITCHING   Items with * indicate a potential emergency and should be followed up as soon as possible or go to the Emergency Department if any problems should occur.  Please show the CHEMOTHERAPY ALERT CARD or IMMUNOTHERAPY  ALERT CARD at check-in to the Emergency Department and triage nurse. Should you have questions after your visit or need to cancel or reschedule your appointment, please contact [facility] - A DEPT OF [facility]  [phone] and follow the prompts.  Office hours are 8:00 a.m. to 4:30 p.m. Monday - Friday. Please note that voicemails left after 4:00 p.m. may not be returned until the following business day.  We are closed weekends and major holidays. You have access to a nurse at all times for urgent questions. Please call the main number to the clinic [phone] and follow the prompts.  For any non-urgent questions, you may also contact your provider using MyChart. We now offer e-Visits for anyone 59 and older to request care online for non-urgent symptoms. For details visit [email or web address].   Also download the MyChart app! Go to the app store, search MyChart, open the app, select [place], and log in with your MyChart username and password.

## 2024-04-25 ENCOUNTER — Ambulatory Visit

## 2024-04-25 LAB — GENECONNECT MOLECULAR SCREEN: Genetic Analysis Overall Interpretation: NEGATIVE

## 2024-04-28 ENCOUNTER — Other Ambulatory Visit (HOSPITAL_COMMUNITY)
Admission: RE | Admit: 2024-04-28 | Discharge: 2024-04-28 | Disposition: A | Discharge status: Home / Self Care | Source: Ambulatory Visit | Attending: Nurse Practitioner | Admitting: Nurse Practitioner

## 2024-04-28 ENCOUNTER — Ambulatory Visit: Admitting: Nurse Practitioner

## 2024-04-28 ENCOUNTER — Other Ambulatory Visit: Payer: Self-pay | Admitting: Nurse Practitioner

## 2024-04-28 ENCOUNTER — Encounter: Payer: Self-pay | Admitting: Nurse Practitioner

## 2024-04-28 ENCOUNTER — Ambulatory Visit: Payer: Self-pay | Admitting: Nurse Practitioner

## 2024-04-28 VITALS — BP 110/72 | HR 49 | Temp 97.1°F | Ht 66.0 in | Wt 217.8 lb

## 2024-04-28 DIAGNOSIS — I491 Atrial premature depolarization: Secondary | ICD-10-CM

## 2024-04-28 DIAGNOSIS — N939 Abnormal uterine and vaginal bleeding, unspecified: Secondary | ICD-10-CM

## 2024-04-28 DIAGNOSIS — E66811 Obesity, class 1: Secondary | ICD-10-CM

## 2024-04-28 DIAGNOSIS — D500 Iron deficiency anemia secondary to blood loss (chronic): Secondary | ICD-10-CM | POA: Diagnosis not present

## 2024-04-28 DIAGNOSIS — R632 Polyphagia: Secondary | ICD-10-CM

## 2024-04-28 LAB — POCT URINALYSIS DIPSTICK
Bilirubin, UA: NEGATIVE
Blood, UA: NEGATIVE
Glucose, UA: NEGATIVE
Ketones, UA: NEGATIVE
Leukocytes, UA: NEGATIVE
Nitrite, UA: NEGATIVE
Protein, UA: NEGATIVE
Spec Grav, UA: 1.01
Urobilinogen, UA: 0.2 U/dL
pH, UA: 7.5

## 2024-04-28 MED ORDER — WEGOVY 0.5 MG/0.5ML ~~LOC~~ SOAJ: 0.5000 mg | SUBCUTANEOUS | 0 refills | Status: DC

## 2024-04-28 NOTE — Assessment & Plan Note (Signed)
 Intermittent vaginal spotting may be due to bacterial vaginosis or perimenopausal changes. Performed a vaginal swab for BV and obtained a urine sample to rule out UTI. Consider ultrasound or GYN follow-up if symptoms persist.

## 2024-04-28 NOTE — Patient Instructions (Signed)
 It was great to see you!  Let's check a swab and urine for your symptoms   Let me know if you are still having symptoms after any treatment   Estroven is a supplement that can help with perimenopause symptoms   Let's follow-up if symptoms worsen or any concerns   Take care,  Tinnie Harada, NP

## 2024-04-28 NOTE — Progress Notes (Signed)
 "  Acute Office Visit  Subjective:     Patient ID: Tamara Ball, female    DOB: 30-Sep-1980, 43 y.o.   MRN: [id number]  Chief Complaint  Patient presents with   Vaginal Bleeding    Light vaginal odor and spotting for 1 week    HPI Discussed the use of AI scribe software for clinical note transcription with the patient, who gave verbal consent to proceed.  History of Present Illness   Tamara Ball is a 43 year old female who presents with vaginal spotting and odor.  She has had one week of brown vaginal spotting, similar to the end of her period but occurring earlier than expected. The spotting is light, only on wiping, and not with every void, with no blood in the toilet. She notes occasional side pain.  She reports a recent change in vaginal odor to a musty smell after switching soap, which has caused similar issues for her in the past. She denies current sexual activity and is confident there is no risk of pregnancy or sexually transmitted infections.  She has intermittent side pain that increases after recent iron  infusions for iron  deficiency. She has had two infusions over the past two weeks and is scheduled for another.  She has chest pain managed with metoprolol  started 3-4 months ago and follows with cardiology. She was told her heart occasionally skips a beat, but has not been told she has a murmur or diagnosed arrhythmia.  She can go long intervals without urinating, which she attributes to low fluid intake. She denies dysuria, frequency, or hematuria.  She reports difficulty sleeping, recent weight gain that is harder to lose over the past year, irritability, and poor sleep quality, which she wonders may be perimenopausal. Her mother had menstrual cycles into her mid-fifties. She is unsure of her paternal medical history and is concerned about her overall health given the limited information.     ROS See pertinent positives and negatives per HPI.     Objective:     BP 110/72 (BP Location: Left Arm, Patient Position: Sitting, Cuff Size: Normal)   Pulse (!) 49   Temp (!) 97.1 F (36.2 C)   Ht 5' 6 (1.676 m)   Wt 217 lb 12.8 oz (98.8 kg)   LMP 04/08/2024 (Exact Date)   SpO2 98%   BMI 35.15 kg/m    Physical Exam Vitals and nursing note reviewed.  Constitutional:      General: She is not in acute distress.    Appearance: Normal appearance.  HENT:     Head: Normocephalic.  Eyes:     Conjunctiva/sclera: Conjunctivae normal.  Cardiovascular:     Rate and Rhythm: Normal rate. Rhythm irregular.     Pulses: Normal pulses.     Heart sounds: Normal heart sounds.  Pulmonary:     Effort: Pulmonary effort is normal.     Breath sounds: Normal breath sounds.  Abdominal:     Palpations: Abdomen is soft.     Tenderness: There is no abdominal tenderness.  Musculoskeletal:     Cervical back: Normal range of motion.  Skin:    General: Skin is warm.  Neurological:     General: No focal deficit present.     Mental Status: She is alert and oriented to person, place, and time.  Psychiatric:        Mood and Affect: Mood normal.        Behavior: Behavior normal.  Thought Content: Thought content normal.        Judgment: Judgment normal.     Results for orders placed or performed in visit on 04/28/24  POCT urinalysis dipstick  Result Value Ref Range   Color, UA     Clarity, UA     Glucose, UA Negative Negative   Bilirubin, UA Negative    Ketones, UA Negative    Spec Grav, UA 1.010 1.010 - 1.025   Blood, UA Negative    pH, UA 7.5 5.0 - 8.0   Protein, UA Negative Negative   Urobilinogen, UA 0.2 0.2 or 1.0 E.U./dL   Nitrite, UA Negative    Leukocytes, UA Negative Negative   Appearance     Odor          Assessment & Plan:   Problem List Items Addressed This Visit       Cardiovascular and Mediastinum   PAC (premature atrial contraction)   Intermittent chest pain and palpitations are managed with metoprolol . Cardiologist follow-up  is ongoing. Continue metoprolol  and cardiology follow-up as needed.         Other   Iron  deficiency anemia due to chronic blood loss   Iron  deficiency anemia is managed with iron  infusions. Fibroids contribute to heavy periods and anemia. Plans to address fibroids with GYN. Continue iron  infusions as scheduled and follow up with OB for fibroid management.      Vaginal spotting - Primary   Intermittent vaginal spotting may be due to bacterial vaginosis or perimenopausal changes. Performed a vaginal swab for BV and obtained a urine sample to rule out UTI. Consider ultrasound or GYN follow-up if symptoms persist.      Relevant Orders   POCT urinalysis dipstick (Completed)   Cervicovaginal ancillary only    No orders of the defined types were placed in this encounter.   Return if symptoms worsen or fail to improve.  Tinnie DELENA Harada, NP   "

## 2024-04-28 NOTE — Assessment & Plan Note (Signed)
 Intermittent chest pain and palpitations are managed with metoprolol . Cardiologist follow-up is ongoing. Continue metoprolol  and cardiology follow-up as needed.

## 2024-04-28 NOTE — Assessment & Plan Note (Signed)
 Iron  deficiency anemia is managed with iron  infusions. Fibroids contribute to heavy periods and anemia. Plans to address fibroids with GYN. Continue iron  infusions as scheduled and follow up with OB for fibroid management.

## 2024-04-29 LAB — CERVICOVAGINAL ANCILLARY ONLY
Bacterial Vaginitis (gardnerella): NEGATIVE
Candida Glabrata: NEGATIVE
Candida Vaginitis: NEGATIVE
Comment: NEGATIVE
Comment: NEGATIVE
Comment: NEGATIVE

## 2024-04-30 ENCOUNTER — Inpatient Hospital Stay

## 2024-04-30 VITALS — BP 106/70 | HR 76 | Temp 98.3°F | Resp 18

## 2024-04-30 DIAGNOSIS — D500 Iron deficiency anemia secondary to blood loss (chronic): Secondary | ICD-10-CM

## 2024-04-30 DIAGNOSIS — N920 Excessive and frequent menstruation with regular cycle: Secondary | ICD-10-CM

## 2024-04-30 MED ORDER — IRON SUCROSE 300 MG IVPB - SIMPLE MED
300.0000 mg | Freq: Once | Status: AC
  Administered 2024-04-30: 300 mg via INTRAVENOUS
  Filled 2024-04-30: qty 300

## 2024-04-30 MED ORDER — SODIUM CHLORIDE 0.9 % IV SOLN: Freq: Once | INTRAVENOUS | Status: AC

## 2024-04-30 NOTE — Patient Instructions (Signed)

## 2024-05-05 ENCOUNTER — Encounter: Payer: Self-pay | Admitting: Nurse Practitioner

## 2024-05-05 ENCOUNTER — Ambulatory Visit: Admitting: Nurse Practitioner

## 2024-05-05 VITALS — BP 122/75 | HR 60 | Ht 66.0 in | Wt 210.0 lb

## 2024-05-05 DIAGNOSIS — R632 Polyphagia: Secondary | ICD-10-CM

## 2024-05-05 DIAGNOSIS — E66811 Obesity, class 1: Secondary | ICD-10-CM | POA: Diagnosis not present

## 2024-05-05 DIAGNOSIS — Z6833 Body mass index (BMI) 33.0-33.9, adult: Secondary | ICD-10-CM

## 2024-05-05 NOTE — Progress Notes (Signed)
 "  Office: [phone]  /  Fax: [phone]  WEIGHT SUMMARY AND BIOMETRICS  Weight Lost Since Last Visit: 0  Weight Gained Since Last Visit: 2lb   Vitals BP: 122/75 Pulse Rate: 60 SpO2: 100 %   Anthropometric Measurements Height: 5' 6 (1.676 m) Weight: 210 lb (95.3 kg) BMI (Calculated): 33.91 Weight at Last Visit: 208lb Weight Lost Since Last Visit: 0 Weight Gained Since Last Visit: 2lb Starting Weight: 205lb Total Weight Loss (lbs): 0 lb (0 kg)   Body Composition  Body Fat %: 41 % Fat Mass (lbs): 86.4 lbs Muscle Mass (lbs): 118 lbs Total Body Water (lbs): 80 lbs Visceral Fat Rating : 10   Other Clinical Data RMR: 1411 Fasting: yes Labs: yes Today's Visit #: 56 Starting Date: 06/13/18     HPI  Chief Complaint: OBESITY  Tamara Ball is here to discuss her progress with her obesity treatment plan. She is on the the Category 3 Plan and states she is following her eating plan approximately 30 % of the time. She states she is exercising 45-60 minutes 3-4 days per week.   Interval History:  Since last office visit she has gained 2 pounds.  She recently celebrated the holidays and went on vacation. She starts traveling for work in February.  She is doing cardio and resistance training 3-4 days per week at [facility]. She notes some cravings especially sugar.    Pharmacotherapy for weight loss: She is not currently taking Wegovy  0.5mg  due to being unable to obtain the prescription from the pharmacy.  She took Saxenda  last 04/29/24.  She is planning to pick up the Wegovy  today.    Previous pharmacotherapy for medical weight loss:   -Ozempic , Saxenda , Victoza  and Rybelsus .  Felt that Ozempic  worked the best for her but is unable to take due to cost.     Bariatric surgery:  Patient has not had bariatric surgery       PHYSICAL EXAM:  Blood pressure 122/75, pulse 60, height 5' 6 (1.676 m), weight 210 lb (95.3 kg), last menstrual period 04/08/2024, SpO2 100%. Body mass  index is 33.89 kg/m.  General: She is overweight, cooperative, alert, well developed, and in no acute distress. PSYCH: Has normal mood, affect and thought process.   Extremities: No edema.  Neurologic: No gross sensory or motor deficits. No tremors or fasciculations noted.    DIAGNOSTIC DATA REVIEWED:  BMET    Component Value Date/Time   NA 139 04/07/2024 0841   NA 141 08/09/2023 1029   K 4.9 04/07/2024 0841   K 4.2 08/11/2016 1404   CL 107 04/07/2024 0841   CL 103 08/11/2016 1404   CO2 23 04/07/2024 0841   CO2 26 08/11/2016 1404   GLUCOSE 88 04/07/2024 0841   BUN 16 04/07/2024 0841   BUN 10 08/09/2023 1029   CREATININE 1.01 (H) 04/07/2024 0841   CREATININE 0.87 08/11/2016 1404   CALCIUM 9.4 04/07/2024 0841   CALCIUM 9.5 08/11/2016 1404   GFRNONAA >60 04/07/2024 0841   GFRAA 83 01/27/2020 0844   Lab Results  Component Value Date   HGBA1C 5.3 08/09/2023   HGBA1C 5.0 04/07/2015   Lab Results  Component Value Date   INSULIN  21.6 08/09/2023   INSULIN  8.9 06/13/2018   Lab Results  Component Value Date   TSH 1.940 08/09/2023   CBC    Component Value Date/Time   WBC 6.8 04/07/2024 0841   WBC 8.0 12/17/2023 1314   RBC 4.71 04/07/2024 0841   HGB 13.3  04/07/2024 0841   HGB 11.4 08/09/2023 1029   HGB 14.2 04/20/2017 1524   HCT 41.1 04/07/2024 0841   HCT 37.0 08/09/2023 1029   HCT 43.2 04/20/2017 1524   PLT 229 04/07/2024 0841   PLT 261 08/09/2023 1029   MCV 87.3 04/07/2024 0841   MCV 84 08/09/2023 1029   MCV 90 04/20/2017 1524   MCH 28.2 04/07/2024 0841   MCHC 32.4 04/07/2024 0841   RDW 13.7 04/07/2024 0841   RDW 13.3 08/09/2023 1029   RDW 13.5 04/20/2017 1524   Iron  Studies    Component Value Date/Time   IRON  51 04/07/2024 0841   IRON  24 (L) 08/09/2023 1029   IRON  103 04/20/2017 1524   TIBC 524 (H) 04/07/2024 0841   TIBC 450 (H) 04/20/2017 1524   FERRITIN 19 04/07/2024 0841   FERRITIN 8 (L) 08/09/2023 1029   FERRITIN 23 04/20/2017 1524   IRONPCTSAT  10 (L) 04/07/2024 0841   IRONPCTSAT 23 04/20/2017 1524   IRONPCTSAT 18 (L) 10/30/2014 1213   Lipid Panel     Component Value Date/Time   CHOL 126 08/09/2023 1029   TRIG 26 08/09/2023 1029   HDL 59 08/09/2023 1029   CHOLHDL 2.2 07/13/2020 1421   CHOLHDL 2 05/27/2018 0848   VLDL 10.4 05/27/2018 0848   LDLCALC 59 08/09/2023 1029   Hepatic Function Panel     Component Value Date/Time   PROT 7.4 04/07/2024 0841   PROT 6.9 08/09/2023 1029   ALBUMIN 4.3 04/07/2024 0841   ALBUMIN 4.1 08/09/2023 1029   ALBUMIN 4.2 08/11/2016 1404   AST 17 04/07/2024 0841   ALT 8 04/07/2024 0841   ALKPHOS 72 04/07/2024 0841   ALKPHOS 58 08/11/2016 1404   BILITOT 0.4 04/07/2024 0841   BILIDIR 0.0 06/24/2012 1055      Component Value Date/Time   TSH 1.940 08/09/2023 1029   Nutritional Lab Results  Component Value Date   VD25OH 51.3 07/19/2022   VD25OH 37.3 01/05/2022   VD25OH 45.01 02/24/2021     ASSESSMENT AND PLAN  TREATMENT PLAN FOR OBESITY:  Recommended Dietary Goals  Tamara Ball is currently in the action stage of change. As such, her goal is to continue weight management plan. She has agreed to keeping a food journal and adhering to recommended goals of 1200-1500 (based upon days she exercises) calories and 90+ grams of protein.  Behavioral Intervention  We discussed the following Behavioral Modification Strategies today: increasing lean protein intake to established goals, decreasing simple carbohydrates , increasing vegetables, increasing fiber rich foods, increasing water intake , work on meal planning and preparation, work on tracking and journaling calories using tracking application, reading food labels , keeping healthy foods at home, celebration eating strategies, continue to work on maintaining a reduced calorie state, getting the recommended amount of protein, incorporating whole foods, making healthy choices, staying well hydrated and practicing mindfulness when eating., and  increase protein intake, fibrous foods (25 grams per day for women, 30 grams for men) and water to improve satiety and decrease hunger signals. .  Additional resources provided today: NA  Recommended Physical Activity Goals  Tamara Ball has been advised to work up to 150 minutes of moderate intensity aerobic activity a week and strengthening exercises 2-3 times per week for cardiovascular health, weight loss maintenance and preservation of muscle mass.   She has agreed to Think about enjoyable ways to increase daily physical activity and overcoming barriers to exercise, Increase physical activity in their day and reduce sedentary time (increase  NEAT)., Continue to gradually increase the amount and intensity of exercise routine, Increase volume of physical activity to a goal of 240 minutes a week, and Combine aerobic and strengthening exercises for efficiency and improved cardiometabolic health.   Pharmacotherapy We discussed various medication options to help [facility] with her weight loss efforts and we both agreed to start Wegovy  0.5mg .  side effects discussed.  ASSOCIATED CONDITIONS ADDRESSED TODAY  Action/Plan  Polyphagia To start Wegovy .  Side effects discussed  Obesity, Class I, BMI 30-34.9     Labs reviewed with patient from 04/07/24.  Keep follow up appt with hematology     Return in about 4 weeks (around 06/02/2024).SABRA She was informed of the importance of frequent follow up visits to maximize her success with intensive lifestyle modifications for her multiple health conditions.   ATTESTASTION STATEMENTS:  Reviewed by clinician on day of visit: allergies, medications, problem list, medical history, surgical history, family history, social history, and previous encounter notes.   I personally spent a total of 30 minutes in the care of the patient today including preparing to see the patient, getting/reviewing separately obtained history, performing a medically appropriate  exam/evaluation, counseling and educating, documenting clinical information in the EHR, independently interpreting results, and communicating results.    Corean SAUNDERS. Sem Mccaughey FNP-C "

## 2024-05-19 ENCOUNTER — Ambulatory Visit: Payer: Self-pay

## 2024-05-19 ENCOUNTER — Encounter: Payer: Self-pay | Admitting: Family Medicine

## 2024-05-19 ENCOUNTER — Ambulatory Visit: Admitting: Family Medicine

## 2024-05-19 VITALS — BP 96/67 | HR 81 | Ht 66.0 in | Wt 214.0 lb

## 2024-05-19 DIAGNOSIS — J101 Influenza due to other identified influenza virus with other respiratory manifestations: Secondary | ICD-10-CM | POA: Insufficient documentation

## 2024-05-19 DIAGNOSIS — R6889 Other general symptoms and signs: Secondary | ICD-10-CM

## 2024-05-19 LAB — POC COVID19/FLU A&B COMBO
Covid Antigen, POC: NEGATIVE
Influenza A Antigen, POC: POSITIVE — AB
Influenza B Antigen, POC: NEGATIVE

## 2024-05-19 MED ORDER — OSELTAMIVIR PHOSPHATE 75 MG PO CAPS: 75.0000 mg | ORAL_CAPSULE | Freq: Two times a day (BID) | ORAL | 0 refills | Status: AC

## 2024-05-19 NOTE — Patient Instructions (Signed)

## 2024-05-19 NOTE — Progress Notes (Signed)
 " Tamara Ball - 44 y.o. female MRN [id number]  Date of birth: 1980/06/25  Subjective Chief Complaint  Patient presents with   Influenza    HPI Tamara Ball is a 44 y.o. female here today with complaint of cough, congestion, chills, body aches, and fatigue.  Symptoms started about 3 days ago.  Some wheezing yesterday.  Denies dyspnea.  Has used dayquil and ibuprofen  as needed which does seem to help some.  She is drinking a good amount of fluids.   ROS:  A comprehensive ROS was completed and negative except as noted per HPI  Allergies[1]  Past Medical History:  Diagnosis Date   Anemia    Anxiety    Chicken pox    Chronic headaches    Constipation    Fibroid    Fibroids    Iron  deficiency anemia due to chronic blood loss    from heavy periods   Menorrhagia    Palpitations     History reviewed. No pertinent surgical history.  Social History   Socioeconomic History   Marital status: Single    Spouse name: Not on file   Number of children: 1   Years of education: Not on file   Highest education level: Master's degree (e.g., MA, MS, MEng, MEd, MSW, MBA)  Occupational History   Occupation: Public House Manager  Tobacco Use   Smoking status: Never   Smokeless tobacco: Never  Vaping Use   Vaping status: Never Used  Substance and Sexual Activity   Alcohol use: Yes    Alcohol/week: 0.0 standard drinks of alcohol    Comment: social   Drug use: No   Sexual activity: Not Currently    Partners: Male    Birth control/protection: None  Other Topics Concern   Not on file  Social History Narrative   Work or School: higher education - Ambulance person      Home Situation: lives with 46 yo      Spiritual Beliefs: Christian      Lifestyle: no regular exercise; diet is so so      Social Drivers of Health   Tobacco Use: Low Risk (05/19/2024)   Patient History    Smoking Tobacco Use: Never    Smokeless Tobacco Use: Never    Passive Exposure: Not on file  Financial Resource  Strain: Low Risk (04/28/2024)   Overall Financial Resource Strain (CARDIA)    Difficulty of Paying Living Expenses: Not hard at all  Food Insecurity: No Food Insecurity (04/28/2024)   Epic    Worried About Radiation Protection Practitioner of Food in the Last Year: Never true    Ran Out of Food in the Last Year: Never true  Transportation Needs: No Transportation Needs (04/28/2024)   Epic    Lack of Transportation (Medical): No    Lack of Transportation (Non-Medical): No  Physical Activity: Sufficiently Active (04/28/2024)   Exercise Vital Sign    Days of Exercise per Week: 3 days    Minutes of Exercise per Session: 60 min  Stress: No Stress Concern Present (04/28/2024)   [employer] of Occupational Health - Occupational Stress Questionnaire    Feeling of Stress: Not at all  Social Connections: Moderately Integrated (04/28/2024)   Social Connection and Isolation Panel    Frequency of Communication with Friends and Family: Twice a week    Frequency of Social Gatherings with Friends and Family: Twice a week    Attends Religious Services: 1 to 4 times per year  Active Member of Clubs or Organizations: No    Attends Banker Meetings: Not on file    Marital Status: Living with partner  Depression (PHQ2-9): Low Risk (04/30/2024)   Depression (PHQ2-9)    PHQ-2 Score: 0  Alcohol Screen: Low Risk (04/28/2024)   Alcohol Screen    Last Alcohol Screening Score (AUDIT): 1  Housing: Low Risk (04/28/2024)   Epic    Unable to Pay for Housing in the Last Year: No    Number of Times Moved in the Last Year: 1    Homeless in the Last Year: No  Utilities: Not on file  Health Literacy: Not on file    Family History  Problem Relation Age of Onset   Hypertension Mother    Cancer Mother        SCC (x2) dx. 23 and again 6 months later   Other Sister        tumors- unknown type   Cancer Maternal Aunt        colorectal cancer   Hypertension Maternal Uncle    Healthy Maternal Grandmother     Diabetes Maternal Grandfather    Glaucoma Maternal Grandfather    Hypertension Maternal Grandfather    Hyperlipidemia Maternal Grandfather    Gout Maternal Grandfather    Colon polyps Neg Hx    Colon cancer Neg Hx    Esophageal cancer Neg Hx     Health Maintenance  Topic Date Due   Hepatitis C Screening  Never done   Hepatitis B Vaccines 19-59 Average Risk (1 of 3 - 19+ 3-dose series) Never done   HPV VACCINES (1 - 3-dose SCDM series) Never done   Cervical Cancer Screening (HPV/Pap Cotest)  03/28/2021   Mammogram  06/01/2021   Influenza Vaccine  08/05/2024 (Originally 12/07/2023)   COVID-19 Vaccine (3 - 2025-26 season) 01/05/2025 (Originally 01/07/2024)   DTaP/Tdap/Td (2 - Td or Tdap) 05/19/2025 (Originally 03/16/2024)   Colonoscopy  02/24/2026   HIV Screening  Completed   Pneumococcal Vaccine  Aged Out   Meningococcal B Vaccine  Aged Out     ----------------------------------------------------------------------------------------------------------------------------------------------------------------------------------------------------------------- Physical Exam BP 96/67 (BP Location: Left Arm, Patient Position: Sitting, Cuff Size: Normal)   Pulse 81   Ht 5' 6 (1.676 m)   Wt 214 lb (97.1 kg)   SpO2 100%   BMI 34.54 kg/m   Physical Exam Constitutional:      Appearance: Normal appearance.  HENT:     Head: Normocephalic and atraumatic.  Eyes:     General: No scleral icterus. Cardiovascular:     Rate and Rhythm: Normal rate and regular rhythm.  Pulmonary:     Effort: Pulmonary effort is normal.     Breath sounds: Normal breath sounds.  Musculoskeletal:     Cervical back: Neck supple.  Neurological:     General: No focal deficit present.     Mental Status: She is alert.  Psychiatric:        Mood and Affect: Mood normal.        Behavior: Behavior normal.      ------------------------------------------------------------------------------------------------------------------------------------------------------------------------------------------------------------------- Assessment and Plan  Influenza A  Symptomatic therapy suggested: Positive POC influenza A.  push fluids, rest, return office visit prn if symptoms persist or worsen, and hyperacute symptoms should respond to neuraminidase inhibitor.   Adding Tamiflu .  Lack of antibiotic effectiveness discussed with her. Call or return to clinic prn if these symptoms worsen or fail to improve as anticipated.   Meds ordered this encounter  Medications  oseltamivir  (TAMIFLU ) 75 MG capsule    Sig: Take 1 capsule (75 mg total) by mouth 2 (two) times daily.    Dispense:  10 capsule    Refill:  0    No follow-ups on file.        [1] No Known Allergies  "

## 2024-05-19 NOTE — Telephone Encounter (Signed)
 Copied from CRM #[id number]. Topic: Clinical - Red Word Triage >> May 19, 2024  1:23 PM Ahlexyia S wrote: Red Word that prompted transfer to Nurse Triage: Pt called in stating that she has been having body aches, nasal congestion and wheezing. Pt stated she was unable to check if she had a fever or not. Pt denied any other symptoms. Warm transferred to nurse triage.  Hot/cold  Reason for Disposition  [1] Sinus congestion as part of a cold AND [2] present < 10 days  Answer Assessment - Initial Assessment Questions Home COVID and flu negative 1. LOCATION: Where does it hurt?      Body aches, congestion, hot cold. 2. ONSET: When did the sinus pain start?  (e.g., hours, days)      About 4 days 6. NASAL DISCHARGE: Do you have discharge from your nose? If so ask, What color?     Clear mostly, little yellow sometimes 7. FEVER: Do you have a fever? If Yes, ask: What is it, how was it measured, and when did it start?      Denies but has not checked 8. OTHER SYMPTOMS: Do you have any other symptoms? (e.g., sore throat, cough, earache, difficulty breathing)     Denies  Protocols used: Sinus Pain or Congestion-A-AH

## 2024-05-19 NOTE — Assessment & Plan Note (Signed)
" °  Symptomatic therapy suggested: Positive POC influenza A.  push fluids, rest, return office visit prn if symptoms persist or worsen, and hyperacute symptoms should respond to neuraminidase inhibitor.   Adding Tamiflu .  Lack of antibiotic effectiveness discussed with her. Call or return to clinic prn if these symptoms worsen or fail to improve as anticipated.  "

## 2024-05-21 ENCOUNTER — Encounter: Payer: Self-pay | Admitting: Family Medicine

## 2024-05-21 DIAGNOSIS — Z1211 Encounter for screening for malignant neoplasm of colon: Secondary | ICD-10-CM

## 2024-05-23 ENCOUNTER — Encounter

## 2024-05-23 ENCOUNTER — Telehealth: Admitting: Physician Assistant

## 2024-05-23 DIAGNOSIS — H02886 Meibomian gland dysfunction of left eye, unspecified eyelid: Secondary | ICD-10-CM | POA: Diagnosis not present

## 2024-05-24 NOTE — Progress Notes (Signed)
 We are sorry that you are not feeling well. Here is how we plan to help!  Based on what you have shared with me it looks like you have a stye.  A stye is an inflammation of the eyelid.  It is often a red, painful lump near the edge of the eyelid that may look like a boil or a pimple.  A stye develops when an infection occurs at the base of an eyelash.   We have made appropriate suggestions for you based upon your presentation: Simple styes can be treated without medical intervention.  Most styes either resolve spontaneously or resolve with simple home treatment by applying warm compresses or heated washcloth to the stye for about 10-15 minutes three to four times a day. This causes the stye to drain and resolve.  HOME CARE:  Wash your hands often! Let the stye open on its own. Don't squeeze or open it. Don't rub your eyes. This can irritate your eyes and let in bacteria.  If you need to touch your eyes, wash your hands first. Don't wear eye makeup or contact lenses until the area has healed.  GET HELP RIGHT AWAY IF:  Your symptoms do not improve. You develop blurred or loss of vision. Your symptoms worsen (increased discharge, pain or redness).  Thank you for choosing an e-visit.  Your e-visit answers were reviewed by a board certified advanced clinical practitioner to complete your personal care plan.  Depending upon the condition, your plan could have included both over the counter or prescription medications.  Please review your pharmacy choice.  Make sure the pharmacy is open so you can pick up prescription now.  If there is a problem, you may contact your provider through [employer] and have the prescription routed to another pharmacy.    Your safety is important to us .  If you have drug allergies check your prescription carefully.  For the next 24 hours you can use MyChart to ask questions about today's visit, request a non-urgent call back, or ask for a work or school  excuse.  You will get an email in the next two days asking about your experience.  I hope you that your e-visit has been valuable and will speed your recovery.  I have spent 5 minutes in review of e-visit questionnaire, review and updating patient chart, medical decision making and response to patient.   Teena Shuck, PA-C

## 2024-06-05 ENCOUNTER — Encounter: Payer: Self-pay | Admitting: Nurse Practitioner

## 2024-06-05 ENCOUNTER — Ambulatory Visit: Admitting: Nurse Practitioner

## 2024-06-05 VITALS — BP 109/72 | HR 66 | Temp 97.8°F | Ht 66.0 in | Wt 206.0 lb

## 2024-06-05 DIAGNOSIS — R632 Polyphagia: Secondary | ICD-10-CM

## 2024-06-05 DIAGNOSIS — Z6833 Body mass index (BMI) 33.0-33.9, adult: Secondary | ICD-10-CM | POA: Diagnosis not present

## 2024-06-05 DIAGNOSIS — E66811 Obesity, class 1: Secondary | ICD-10-CM | POA: Diagnosis not present

## 2024-06-05 MED ORDER — SEMAGLUTIDE (2 MG/DOSE) 8 MG/3ML ~~LOC~~ SOPN: 2.0000 mg | PEN_INJECTOR | SUBCUTANEOUS | 1 refills | Status: DC

## 2024-06-05 NOTE — Progress Notes (Addendum)
 "  Office: [phone]  /  Fax: [phone]  WEIGHT SUMMARY AND BIOMETRICS  Weight Lost Since Last Visit: 4lb  Weight Gained Since Last Visit: 0lb   Vitals Temp: 97.8 F (36.6 C) BP: 109/72 Pulse Rate: 66 SpO2: 100 %   Anthropometric Measurements Height: 5' 6 (1.676 m) Weight: 206 lb (93.4 kg) BMI (Calculated): 33.27 Weight at Last Visit: 210lb Weight Lost Since Last Visit: 4lb Weight Gained Since Last Visit: 0lb Starting Weight: 205lb Total Weight Loss (lbs): 0 lb (0 kg)   Body Composition  Body Fat %: 40.3 % Fat Mass (lbs): 83.4 lbs Muscle Mass (lbs): 117.2 lbs Total Body Water (lbs): 79.2 lbs Visceral Fat Rating : 9   Other Clinical Data Fasting: No Labs: No Today's Visit #: 41 Starting Date: 06/13/18     HPI  Chief Complaint: OBESITY  Tamara Ball is here to discuss her progress with her obesity treatment plan. She is on the the Category 3 Plan and states she is following her eating plan approximately 70 % of the time. She states she is exercising 60 minutes 3 days per week.   Interval History:  Since last office visit she has lost 4 pounds.  Has been eating out more due to remodeling her kitchen.  She has been trying to make healthier choices.     Pharmacotherapy for weight loss: She is currently taking Wegovy  0.5mg  for medical weight loss.  Denies side effects.     Previous pharmacotherapy for medical weight loss:   -Ozempic , Saxenda , Victoza  and Rybelsus .  Felt that Ozempic  worked the best for her but is unable to take due to cost.     Bariatric surgery:  Patient has not had bariatric surgery      PHYSICAL EXAM:  Blood pressure 109/72, pulse 66, temperature 97.8 F (36.6 C), height 5' 6 (1.676 m), weight 206 lb (93.4 kg), last menstrual period 06/05/2024, SpO2 100%. Body mass index is 33.25 kg/m.  General: She is overweight, cooperative, alert, well developed, and in no acute distress. PSYCH: Has normal mood, affect and thought process.    Extremities: No edema.  Neurologic: No gross sensory or motor deficits. No tremors or fasciculations noted.    DIAGNOSTIC DATA REVIEWED:  BMET    Component Value Date/Time   NA 139 04/07/2024 0841   NA 141 08/09/2023 1029   K 4.9 04/07/2024 0841   K 4.2 08/11/2016 1404   CL 107 04/07/2024 0841   CL 103 08/11/2016 1404   CO2 23 04/07/2024 0841   CO2 26 08/11/2016 1404   GLUCOSE 88 04/07/2024 0841   BUN 16 04/07/2024 0841   BUN 10 08/09/2023 1029   CREATININE 1.01 (H) 04/07/2024 0841   CREATININE 0.87 08/11/2016 1404   CALCIUM 9.4 04/07/2024 0841   CALCIUM 9.5 08/11/2016 1404   GFRNONAA >60 04/07/2024 0841   GFRAA 83 01/27/2020 0844   Lab Results  Component Value Date   HGBA1C 5.3 08/09/2023   HGBA1C 5.0 04/07/2015   Lab Results  Component Value Date   INSULIN  21.6 08/09/2023   INSULIN  8.9 06/13/2018   Lab Results  Component Value Date   TSH 1.940 08/09/2023   CBC    Component Value Date/Time   WBC 6.8 04/07/2024 0841   WBC 8.0 12/17/2023 1314   RBC 4.71 04/07/2024 0841   HGB 13.3 04/07/2024 0841   HGB 11.4 08/09/2023 1029   HGB 14.2 04/20/2017 1524   HCT 41.1 04/07/2024 0841   HCT 37.0 08/09/2023 1029  HCT 43.2 04/20/2017 1524   PLT 229 04/07/2024 0841   PLT 261 08/09/2023 1029   MCV 87.3 04/07/2024 0841   MCV 84 08/09/2023 1029   MCV 90 04/20/2017 1524   MCH 28.2 04/07/2024 0841   MCHC 32.4 04/07/2024 0841   RDW 13.7 04/07/2024 0841   RDW 13.3 08/09/2023 1029   RDW 13.5 04/20/2017 1524   Iron  Studies    Component Value Date/Time   IRON  51 04/07/2024 0841   IRON  24 (L) 08/09/2023 1029   IRON  103 04/20/2017 1524   TIBC 524 (H) 04/07/2024 0841   TIBC 450 (H) 04/20/2017 1524   FERRITIN 19 04/07/2024 0841   FERRITIN 8 (L) 08/09/2023 1029   FERRITIN 23 04/20/2017 1524   IRONPCTSAT 10 (L) 04/07/2024 0841   IRONPCTSAT 23 04/20/2017 1524   IRONPCTSAT 18 (L) 10/30/2014 1213   Lipid Panel     Component Value Date/Time   CHOL 126 08/09/2023 1029    TRIG 26 08/09/2023 1029   HDL 59 08/09/2023 1029   CHOLHDL 2.2 07/13/2020 1421   CHOLHDL 2 05/27/2018 0848   VLDL 10.4 05/27/2018 0848   LDLCALC 59 08/09/2023 1029   Hepatic Function Panel     Component Value Date/Time   PROT 7.4 04/07/2024 0841   PROT 6.9 08/09/2023 1029   ALBUMIN 4.3 04/07/2024 0841   ALBUMIN 4.1 08/09/2023 1029   ALBUMIN 4.2 08/11/2016 1404   AST 17 04/07/2024 0841   ALT 8 04/07/2024 0841   ALKPHOS 72 04/07/2024 0841   ALKPHOS 58 08/11/2016 1404   BILITOT 0.4 04/07/2024 0841   BILIDIR 0.0 06/24/2012 1055      Component Value Date/Time   TSH 1.940 08/09/2023 1029   Nutritional Lab Results  Component Value Date   VD25OH 51.3 07/19/2022   VD25OH 37.3 01/05/2022   VD25OH 45.01 02/24/2021     ASSESSMENT AND PLAN  TREATMENT PLAN FOR OBESITY:  Recommended Dietary Goals  Tamara Ball is currently in the action stage of change. As such, her goal is to continue weight management plan. She has agreed to keeping a food journal and adhering to recommended goals of 1200-1400 calories and 80+ grams of protein.  Behavioral Intervention  We discussed the following Behavioral Modification Strategies today: increasing lean protein intake to established goals, decreasing simple carbohydrates , increasing vegetables, increasing fiber rich foods, increasing water intake , work on meal planning and preparation, reading food labels , keeping healthy foods at home, continue to work on implementation of reduced calorie nutritional plan, continue to practice mindfulness when eating, planning for success, continue to work on maintaining a reduced calorie state, getting the recommended amount of protein, incorporating whole foods, making healthy choices, staying well hydrated and practicing mindfulness when eating., and increase protein intake, fibrous foods (25 grams per day for women, 30 grams for men) and water to improve satiety and decrease hunger signals. .  Additional  resources provided today: NA  Recommended Physical Activity Goals  Tamara Ball has been advised to work up to 150 minutes of moderate intensity aerobic activity a week and strengthening exercises 2-3 times per week for cardiovascular health, weight loss maintenance and preservation of muscle mass.   She has agreed to Think about enjoyable ways to increase daily physical activity and overcoming barriers to exercise, Increase physical activity in their day and reduce sedentary time (increase NEAT)., Continue to gradually increase the amount and intensity of exercise routine, Increase volume of physical activity to a goal of 240 minutes a week, and  Combine aerobic and strengthening exercises for efficiency and improved cardiometabolic health.   Pharmacotherapy We discussed various medication options to help [facility] with her weight loss efforts and we both agreed to stop Wegovy  0.5mg . and start Ozempic  1mg  off label for weight loss.  Side effects discussed.  Discussed with Dr. Delores   ASSOCIATED CONDITIONS ADDRESSED TODAY  Action/Plan  Polyphagia -     Semaglutide  (2 MG/DOSE); Inject 2 mg as directed once a week.  Dispense: 3 mL; Refill: 1.  Side effects discussed  Will take Ozempic  1mg .  Showed how to take 1mg -amount of clicks with the 2mg  pen.  Will increase to 2mg  if needed-based on how patient is doing.  Side effects discussed.   Obesity, Class I, BMI 30-34.9 -     Semaglutide  (2 MG/DOSE); Inject 2 mg as directed once a week.  Dispense: 3 mL; Refill: 1         Return in about 4 weeks (around 07/03/2024).SABRA She was informed of the importance of frequent follow up visits to maximize her success with intensive lifestyle modifications for her multiple health conditions.   ATTESTASTION STATEMENTS:  Reviewed by clinician on day of visit: allergies, medications, problem list, medical history, surgical history, family history, social history, and previous encounter notes.     Corean SAUNDERS.  Pearlina Friedly FNP-C "

## 2024-06-08 ENCOUNTER — Other Ambulatory Visit: Payer: Self-pay | Admitting: Nurse Practitioner

## 2024-06-08 DIAGNOSIS — R632 Polyphagia: Secondary | ICD-10-CM

## 2024-06-08 DIAGNOSIS — E66811 Obesity, class 1: Secondary | ICD-10-CM

## 2024-06-10 ENCOUNTER — Other Ambulatory Visit: Payer: Self-pay | Admitting: Nurse Practitioner

## 2024-06-10 DIAGNOSIS — R632 Polyphagia: Secondary | ICD-10-CM

## 2024-06-10 DIAGNOSIS — E66811 Obesity, class 1: Secondary | ICD-10-CM

## 2024-06-11 ENCOUNTER — Other Ambulatory Visit: Payer: Self-pay | Admitting: Nurse Practitioner

## 2024-06-11 DIAGNOSIS — E66811 Obesity, class 1: Secondary | ICD-10-CM

## 2024-06-11 DIAGNOSIS — R632 Polyphagia: Secondary | ICD-10-CM

## 2024-06-11 MED ORDER — WEGOVY 0.5 MG/0.5ML ~~LOC~~ SOAJ: 0.5000 mg | SUBCUTANEOUS | 0 refills | Status: AC

## 2024-07-03 ENCOUNTER — Ambulatory Visit: Admitting: Nurse Practitioner

## 2024-08-06 ENCOUNTER — Inpatient Hospital Stay: Attending: Family

## 2024-08-06 ENCOUNTER — Inpatient Hospital Stay: Admitting: Family
